# Patient Record
Sex: Female | Born: 1955 | Race: Black or African American | Hispanic: No | Marital: Married | Smoking: Never smoker
Health system: Southern US, Community
[De-identification: ages and names within clinical notes are randomized; demographics above are authoritative.]

## PROBLEM LIST (undated history)

## (undated) DIAGNOSIS — E119 Type 2 diabetes mellitus without complications: Secondary | ICD-10-CM

## (undated) DIAGNOSIS — I10 Essential (primary) hypertension: Secondary | ICD-10-CM

## (undated) DIAGNOSIS — E079 Disorder of thyroid, unspecified: Secondary | ICD-10-CM

## (undated) DIAGNOSIS — J40 Bronchitis, not specified as acute or chronic: Secondary | ICD-10-CM

## (undated) DIAGNOSIS — R51 Headache: Secondary | ICD-10-CM

## (undated) DIAGNOSIS — D869 Sarcoidosis, unspecified: Secondary | ICD-10-CM

## (undated) DIAGNOSIS — L03116 Cellulitis of left lower limb: Secondary | ICD-10-CM

## (undated) DIAGNOSIS — E039 Hypothyroidism, unspecified: Secondary | ICD-10-CM

## (undated) DIAGNOSIS — D259 Leiomyoma of uterus, unspecified: Secondary | ICD-10-CM

## (undated) DIAGNOSIS — M797 Fibromyalgia: Secondary | ICD-10-CM

## (undated) DIAGNOSIS — K219 Gastro-esophageal reflux disease without esophagitis: Secondary | ICD-10-CM

## (undated) DIAGNOSIS — Z9289 Personal history of other medical treatment: Secondary | ICD-10-CM

## (undated) DIAGNOSIS — E11621 Type 2 diabetes mellitus with foot ulcer: Secondary | ICD-10-CM

## (undated) DIAGNOSIS — M2550 Pain in unspecified joint: Secondary | ICD-10-CM

## (undated) DIAGNOSIS — E11610 Type 2 diabetes mellitus with diabetic neuropathic arthropathy: Secondary | ICD-10-CM

## (undated) DIAGNOSIS — J309 Allergic rhinitis, unspecified: Secondary | ICD-10-CM

## (undated) DIAGNOSIS — M5090 Cervical disc disorder, unspecified, unspecified cervical region: Secondary | ICD-10-CM

## (undated) DIAGNOSIS — A419 Sepsis, unspecified organism: Secondary | ICD-10-CM

## (undated) DIAGNOSIS — L97509 Non-pressure chronic ulcer of other part of unspecified foot with unspecified severity: Secondary | ICD-10-CM

## (undated) DIAGNOSIS — J988 Other specified respiratory disorders: Secondary | ICD-10-CM

## (undated) DIAGNOSIS — M1990 Unspecified osteoarthritis, unspecified site: Secondary | ICD-10-CM

## (undated) DIAGNOSIS — M8660 Other chronic osteomyelitis, unspecified site: Secondary | ICD-10-CM

## (undated) DIAGNOSIS — G8194 Hemiplegia, unspecified affecting left nondominant side: Secondary | ICD-10-CM

## (undated) DIAGNOSIS — R519 Headache, unspecified: Secondary | ICD-10-CM

## (undated) DIAGNOSIS — E871 Hypo-osmolality and hyponatremia: Secondary | ICD-10-CM

## (undated) HISTORY — PX: THYROIDECTOMY: SHX17

## (undated) HISTORY — PX: KNEE ARTHROPLASTY: SHX992

## (undated) HISTORY — PX: CHOLECYSTECTOMY: SHX55

## (undated) HISTORY — PX: FOOT SURGERY: SHX648

## (undated) HISTORY — PX: UTERINE FIBROID SURGERY: SHX826

## (undated) HISTORY — PX: ANTERIOR CERVICAL DECOMP/DISCECTOMY FUSION: SHX1161

## (undated) HISTORY — PX: APPLICATION OF WOUND VAC: SHX5189

## (undated) HISTORY — PX: IRRIGATION AND DEBRIDEMENT FOOT: SHX6602

---

## 2013-10-15 DIAGNOSIS — E11610 Type 2 diabetes mellitus with diabetic neuropathic arthropathy: Secondary | ICD-10-CM | POA: Insufficient documentation

## 2013-10-15 DIAGNOSIS — M25579 Pain in unspecified ankle and joints of unspecified foot: Secondary | ICD-10-CM | POA: Insufficient documentation

## 2014-08-30 DIAGNOSIS — L97529 Non-pressure chronic ulcer of other part of left foot with unspecified severity: Secondary | ICD-10-CM

## 2014-08-30 DIAGNOSIS — E11621 Type 2 diabetes mellitus with foot ulcer: Secondary | ICD-10-CM | POA: Insufficient documentation

## 2014-09-25 DIAGNOSIS — M86672 Other chronic osteomyelitis, left ankle and foot: Secondary | ICD-10-CM | POA: Insufficient documentation

## 2014-09-25 DIAGNOSIS — E871 Hypo-osmolality and hyponatremia: Secondary | ICD-10-CM | POA: Insufficient documentation

## 2016-02-07 DIAGNOSIS — G8194 Hemiplegia, unspecified affecting left nondominant side: Secondary | ICD-10-CM | POA: Insufficient documentation

## 2016-03-13 DIAGNOSIS — M5002 Cervical disc disorder with myelopathy, mid-cervical region, unspecified level: Secondary | ICD-10-CM | POA: Insufficient documentation

## 2016-09-05 ENCOUNTER — Ambulatory Visit (INDEPENDENT_AMBULATORY_CARE_PROVIDER_SITE_OTHER): Payer: Medicare Other | Admitting: Orthopaedic Surgery

## 2016-09-05 ENCOUNTER — Encounter (INDEPENDENT_AMBULATORY_CARE_PROVIDER_SITE_OTHER): Payer: Self-pay | Admitting: Orthopaedic Surgery

## 2016-09-05 DIAGNOSIS — E785 Hyperlipidemia, unspecified: Secondary | ICD-10-CM | POA: Insufficient documentation

## 2016-09-05 DIAGNOSIS — E118 Type 2 diabetes mellitus with unspecified complications: Secondary | ICD-10-CM | POA: Insufficient documentation

## 2016-09-05 DIAGNOSIS — M5442 Lumbago with sciatica, left side: Secondary | ICD-10-CM

## 2016-09-05 DIAGNOSIS — I10 Essential (primary) hypertension: Secondary | ICD-10-CM | POA: Insufficient documentation

## 2016-09-05 DIAGNOSIS — D869 Sarcoidosis, unspecified: Secondary | ICD-10-CM | POA: Insufficient documentation

## 2016-09-05 DIAGNOSIS — M542 Cervicalgia: Secondary | ICD-10-CM | POA: Diagnosis not present

## 2016-09-05 DIAGNOSIS — G8929 Other chronic pain: Secondary | ICD-10-CM | POA: Insufficient documentation

## 2016-09-05 DIAGNOSIS — E1169 Type 2 diabetes mellitus with other specified complication: Secondary | ICD-10-CM | POA: Insufficient documentation

## 2016-09-05 NOTE — Progress Notes (Signed)
Office Visit Note   Patient: Natalie Macdonald           Date of Birth: 04-22-1956           MRN: [id number] Visit Date: 09/05/2016              Requested by: No referring provider defined for this encounter. PCP: No primary care provider on file.   Assessment & Plan: Visit Diagnoses:  1. Cervicalgia   2. Chronic left-sided low back pain with left-sided sciatica     Plan: I reviewed the MRI report that she brought with her today. Shows that she has multiple levels of degenerative disc disease with severe canal stenosis. I recommend referral to Natalie Macdonald of [place] [facility] for evaluation for surgery  Follow-Up Instructions: No Follow-up on file.   Orders:  Orders Placed This Encounter  Procedures  . Ambulatory referral to Neurosurgery   No orders of the defined types were placed in this encounter.     Procedures: No procedures performed   Clinical Data: No additional findings.   Subjective: Chief Complaint  Patient presents with  . Neck - Pain  . Lower Back - Pain    Patient is a 61 year old female with chronic neck and low back pain who comes in having not walked since about March 2016. She originally had diabetic foot ulcer on the left foot but this is healed up.  She has severe neck and back pain and she is here with MRIs of her neck and back from [facility]. She has had issues with incontinence for several months now.    Review of Systems  Constitutional: Negative.   HENT: Negative.   Eyes: Negative.   Respiratory: Negative.   Cardiovascular: Negative.   Endocrine: Negative.   Musculoskeletal: Negative.   Neurological: Negative.   Hematological: Negative.   Psychiatric/Behavioral: Negative.   All other systems reviewed and are negative.    Objective: Vital Signs: There were no vitals taken for this visit.  Physical Exam  Constitutional: She is oriented to person, place, and time. She appears well-developed and well-nourished.  HENT:  Head:  Normocephalic and atraumatic.  Eyes: EOM are normal.  Neck: Neck supple.  Pulmonary/Chest: Effort normal.  Abdominal: Soft.  Neurological: She is alert and oriented to person, place, and time.  Skin: Skin is warm. Capillary refill takes less than 2 seconds.  Psychiatric: She has a normal mood and affect. Her behavior is normal. Judgment and thought content normal.  Nursing note and vitals reviewed.   Ortho Exam Exam of bilateral upper extremities shows no pathologic reflexes. She does have mild contractures of her left hand. Sensation is grossly intact. Exam of all lower extremities is very difficult secondary to patient's participation. Specialty Comments:  No specialty comments available.  Imaging: No results found.   PMFS History: Patient Active Problem List   Diagnosis Date Noted  . Chronic left-sided low back pain with left-sided sciatica 09/05/2016  . Cervicalgia 09/05/2016  . Diabetes mellitus type 2 with complications ([place]) [id number]  . Hyperlipidemia, unspecified 09/05/2016  . Hypertension 09/05/2016  . Sarcoidosis ([place]) 09/05/2016  . Cervical disc disorder with myelopathy of mid-cervical region 03/13/2016  . Left hemiplegia ([place]) 02/07/2016  . Chronic osteomyelitis of ankle and foot, left ([place]) 09/25/2014  . Hyponatremia 09/25/2014  . Diabetic ulcer of left foot associated with type 2 diabetes mellitus ([place]) 08/30/2014  . Charcot's joint of foot due to diabetes ([place]) 10/15/2013  . Pain in joint involving ankle and foot  10/15/2013   No past medical history on file.  No family history on file.  No past surgical history on file. Social History   Occupational History  . Not on file.   Social History Main Topics  . Smoking status: Never Smoker  . Smokeless tobacco: Never Used  . Alcohol use Not on file  . Drug use: Unknown  . Sexual activity: Not on file

## 2016-09-21 ENCOUNTER — Other Ambulatory Visit (HOSPITAL_COMMUNITY): Payer: Self-pay | Admitting: Orthopedic Surgery

## 2016-09-21 ENCOUNTER — Other Ambulatory Visit: Payer: Self-pay | Admitting: Neurosurgery

## 2016-09-21 DIAGNOSIS — M25512 Pain in left shoulder: Secondary | ICD-10-CM

## 2016-09-27 ENCOUNTER — Ambulatory Visit (HOSPITAL_COMMUNITY)
Admission: RE | Admit: 2016-09-27 | Discharge: 2016-09-27 | Disposition: A | Discharge status: Home / Self Care | Payer: Medicare Other | Source: Ambulatory Visit | Attending: Orthopedic Surgery | Admitting: Orthopedic Surgery

## 2016-09-27 DIAGNOSIS — M19012 Primary osteoarthritis, left shoulder: Secondary | ICD-10-CM | POA: Diagnosis not present

## 2016-09-27 DIAGNOSIS — M25512 Pain in left shoulder: Secondary | ICD-10-CM | POA: Diagnosis not present

## 2016-10-10 ENCOUNTER — Encounter (HOSPITAL_COMMUNITY): Payer: Self-pay | Admitting: *Deleted

## 2016-10-10 ENCOUNTER — Encounter (HOSPITAL_COMMUNITY)
Admission: RE | Admit: 2016-10-10 | Discharge: 2016-10-10 | Disposition: A | Discharge status: Home / Self Care | Payer: Medicare Other | Source: Ambulatory Visit | Attending: Neurosurgery | Admitting: Neurosurgery

## 2016-10-10 ENCOUNTER — Other Ambulatory Visit: Payer: Self-pay

## 2016-10-10 DIAGNOSIS — R931 Abnormal findings on diagnostic imaging of heart and coronary circulation: Secondary | ICD-10-CM | POA: Insufficient documentation

## 2016-10-10 DIAGNOSIS — G8929 Other chronic pain: Secondary | ICD-10-CM | POA: Diagnosis not present

## 2016-10-10 DIAGNOSIS — M5002 Cervical disc disorder with myelopathy, mid-cervical region, unspecified level: Secondary | ICD-10-CM | POA: Insufficient documentation

## 2016-10-10 DIAGNOSIS — E11610 Type 2 diabetes mellitus with diabetic neuropathic arthropathy: Secondary | ICD-10-CM | POA: Insufficient documentation

## 2016-10-10 DIAGNOSIS — E118 Type 2 diabetes mellitus with unspecified complications: Secondary | ICD-10-CM | POA: Insufficient documentation

## 2016-10-10 DIAGNOSIS — I10 Essential (primary) hypertension: Secondary | ICD-10-CM | POA: Diagnosis not present

## 2016-10-10 DIAGNOSIS — Z01812 Encounter for preprocedural laboratory examination: Secondary | ICD-10-CM | POA: Diagnosis not present

## 2016-10-10 DIAGNOSIS — M5442 Lumbago with sciatica, left side: Secondary | ICD-10-CM | POA: Insufficient documentation

## 2016-10-10 DIAGNOSIS — E871 Hypo-osmolality and hyponatremia: Secondary | ICD-10-CM | POA: Diagnosis not present

## 2016-10-10 DIAGNOSIS — Z01810 Encounter for preprocedural cardiovascular examination: Secondary | ICD-10-CM | POA: Diagnosis present

## 2016-10-10 DIAGNOSIS — D869 Sarcoidosis, unspecified: Secondary | ICD-10-CM | POA: Insufficient documentation

## 2016-10-10 DIAGNOSIS — E785 Hyperlipidemia, unspecified: Secondary | ICD-10-CM | POA: Insufficient documentation

## 2016-10-10 DIAGNOSIS — E039 Hypothyroidism, unspecified: Secondary | ICD-10-CM | POA: Diagnosis not present

## 2016-10-10 DIAGNOSIS — M86672 Other chronic osteomyelitis, left ankle and foot: Secondary | ICD-10-CM | POA: Diagnosis not present

## 2016-10-10 HISTORY — DX: Hypothyroidism, unspecified: E03.9

## 2016-10-10 HISTORY — DX: Leiomyoma of uterus, unspecified: D25.9

## 2016-10-10 HISTORY — DX: Headache: R51

## 2016-10-10 HISTORY — DX: Cervical disc disorder, unspecified, unspecified cervical region: M50.90

## 2016-10-10 HISTORY — DX: Fibromyalgia: M79.7

## 2016-10-10 HISTORY — DX: Other chronic osteomyelitis, unspecified site: M86.60

## 2016-10-10 HISTORY — DX: Bronchitis, not specified as acute or chronic: J40

## 2016-10-10 HISTORY — DX: Type 2 diabetes mellitus with diabetic neuropathic arthropathy: E11.610

## 2016-10-10 HISTORY — DX: Type 2 diabetes mellitus without complications: E11.9

## 2016-10-10 HISTORY — DX: Sepsis, unspecified organism: A41.9

## 2016-10-10 HISTORY — DX: Hypo-osmolality and hyponatremia: E87.1

## 2016-10-10 HISTORY — DX: Pain in unspecified joint: M25.50

## 2016-10-10 HISTORY — DX: Hemiplegia, unspecified affecting left nondominant side: G81.94

## 2016-10-10 HISTORY — DX: Cellulitis of left lower limb: L03.116

## 2016-10-10 HISTORY — DX: Type 2 diabetes mellitus with foot ulcer: L97.509

## 2016-10-10 HISTORY — DX: Personal history of other medical treatment: Z92.89

## 2016-10-10 HISTORY — DX: Unspecified osteoarthritis, unspecified site: M19.90

## 2016-10-10 HISTORY — DX: Gastro-esophageal reflux disease without esophagitis: K21.9

## 2016-10-10 HISTORY — DX: Sarcoidosis, unspecified: D86.9

## 2016-10-10 HISTORY — DX: Type 2 diabetes mellitus with foot ulcer: E11.621

## 2016-10-10 HISTORY — DX: Essential (primary) hypertension: I10

## 2016-10-10 HISTORY — DX: Other specified respiratory disorders: J98.8

## 2016-10-10 HISTORY — DX: Headache, unspecified: R51.9

## 2016-10-10 HISTORY — DX: Allergic rhinitis, unspecified: J30.9

## 2016-10-10 LAB — CBC
HCT: 40 % (ref 36.0–46.0)
Hemoglobin: 12.8 g/dL (ref 12.0–15.0)
MCH: 28.3 pg (ref 26.0–34.0)
MCHC: 32 g/dL (ref 30.0–36.0)
MCV: 88.3 fL (ref 78.0–100.0)
Platelets: 227 10*3/uL (ref 150–400)
RBC: 4.53 MIL/uL (ref 3.87–5.11)
RDW: 14.2 % (ref 11.5–15.5)
WBC: 15.9 10*3/uL — ABNORMAL HIGH (ref 4.0–10.5)

## 2016-10-10 LAB — BASIC METABOLIC PANEL
Anion gap: 8 (ref 5–15)
BUN: 13 mg/dL (ref 6–20)
CALCIUM: 9.2 mg/dL (ref 8.9–10.3)
CO2: 26 mmol/L (ref 22–32)
CREATININE: 0.69 mg/dL (ref 0.44–1.00)
Chloride: 101 mmol/L (ref 101–111)
GFR calc Af Amer: >60 mL/min (ref >60)
GFR calc non Af Amer: >60 mL/min (ref >60)
GLUCOSE: 102 mg/dL — AB (ref 65–99)
Potassium: 3.8 mmol/L (ref 3.5–5.1)
Sodium: 135 mmol/L (ref 135–145)

## 2016-10-10 LAB — ABO/RH: ABO/RH(D): O POS

## 2016-10-10 LAB — SURGICAL PCR SCREEN
MRSA, PCR: NEGATIVE
STAPHYLOCOCCUS AUREUS: NEGATIVE

## 2016-10-10 LAB — TYPE AND SCREEN
ABO/RH(D): O POS
Antibody Screen: NEGATIVE

## 2016-10-10 LAB — GLUCOSE, CAPILLARY: Glucose-Capillary: 116 mg/dL — ABNORMAL HIGH (ref 65–99)

## 2016-10-10 NOTE — Pre-Procedure Instructions (Addendum)
Natalie Macdonald  10/10/2016      [id number] [address], [place] [address] Phone: [phone] Fax: [phone]    Your procedure is scheduled on April 17  Report to [place] at 1245 A.M.  Call this number if you have problems the morning of surgery:  [phone]   Remember:  Do not eat food or drink liquids after midnight.   Take these medicines the morning of surgery with A SIP OF WATER albuterol (PROVENTIL HFA;VENTOLIN HFA),  carvedilol (COREG), cetirizine (ZYRTEC), fluticasone (FLONASE), gabapentin (NEURONTIN), levothyroxine (SYNTHROID, LEVOTHROID),  pantoprazole (PROTONIX), baclofen (LIORESAL,  morphine (MS CONTIN)  Take all other medications as prescribed except 7 days prior to surgery STOP taking any Aspirin, Aleve, Naproxen, Ibuprofen, Motrin, Advil, Goody's, BC's, all herbal medications, fish oil, and all vitamins  WHAT DO I DO ABOUT MY DIABETES MEDICATION?   Marland Kitchen Do not take oral diabetes medicines (pills) the morning of surgery.  metFORMIN (GLUCOPHAGE)  The day before surgery take usual dose of insulin glulisine (APIDRA)   . THE NIGHT BEFORE SURGERY, take ____22_______ units of __insulin glargine (LANTUS_________insulin.       . The day of surgery, do not take other diabetes injectables, including Byetta (exenatide), Bydureon (exenatide ER), Victoza (liraglutide), or Trulicity (dulaglutide).  . If your CBG is greater than 220 mg/dL, you may take  of your sliding scale (correction) dose of insulin.   How to Manage Your Diabetes Before and After Surgery  Why is it important to control my blood sugar before and after surgery? . Improving blood sugar levels before and after surgery helps healing and can limit problems. . A way of improving blood sugar control is eating a healthy diet by: o  Eating less sugar and carbohydrates o  Increasing activity/exercise o  Talking with  your doctor about reaching your blood sugar goals . High blood sugars (greater than 180 mg/dL) can raise your risk of infections and slow your recovery, so you will need to focus on controlling your diabetes during the weeks before surgery. . Make sure that the doctor who takes care of your diabetes knows about your planned surgery including the date and location.  How do I manage my blood sugar before surgery? . Check your blood sugar at least 4 times a day, starting 2 days before surgery, to make sure that the level is not too high or low. o Check your blood sugar the morning of your surgery when you wake up and every 2 hours until you get to the Short Stay unit. . If your blood sugar is less than 70 mg/dL, you will need to treat for low blood sugar: o Do not take insulin. o Treat a low blood sugar (less than 70 mg/dL) with  cup of clear juice (cranberry or apple), 4 glucose tablets, OR glucose gel. o Recheck blood sugar in 15 minutes after treatment (to make sure it is greater than 70 mg/dL). If your blood sugar is not greater than 70 mg/dL on recheck, call [phone] for further instructions. . Report your blood sugar to the short stay nurse when you get to Short Stay.  . If you are admitted to the hospital after surgery: o Your blood sugar will be checked by the staff and you will probably be given insulin after surgery (instead of oral diabetes medicines) to make sure you have good blood sugar levels. o The goal for blood sugar  control after surgery is 80-180 mg/dL.     Do not wear jewelry, make-up or nail polish.  Do not wear lotions, powders, or perfumes, or deoderant.  Do not shave 48 hours prior to surgery.  Men may shave face and neck.  Do not bring valuables to the hospital.  [facility] is not responsible for any belongings or valuables.  Contacts, dentures or bridgework may not be worn into surgery.  Leave your suitcase in the car.  After surgery it may be brought to your  room.  For patients admitted to the hospital, discharge time will be determined by your treatment team.  Patients discharged the day of surgery will not be allowed to drive home.    Special instructions:   - Preparing For Surgery  Before surgery, you can play an important role. Because skin is not sterile, your skin needs to be as free of germs as possible. You can reduce the number of germs on your skin by washing with CHG (chlorahexidine gluconate) Soap before surgery.  CHG is an antiseptic cleaner which kills germs and bonds with the skin to continue killing germs even after washing.  Please do not use if you have an allergy to CHG or antibacterial soaps. If your skin becomes reddened/irritated stop using the CHG.  Do not shave (including legs and underarms) for at least 48 hours prior to first CHG shower. It is OK to shave your face.  Please follow these instructions carefully.   1. Shower the NIGHT BEFORE SURGERY and the MORNING OF SURGERY with CHG.   2. If you chose to wash your hair, wash your hair first as usual with your normal shampoo.  3. After you shampoo, rinse your hair and body thoroughly to remove the shampoo.  4. Use CHG as you would any other liquid soap. You can apply CHG directly to the skin and wash gently with a scrungie or a clean washcloth.   5. Apply the CHG Soap to your body ONLY FROM THE NECK DOWN.  Do not use on open wounds or open sores. Avoid contact with your eyes, ears, mouth and genitals (private parts). Wash genitals (private parts) with your normal soap.  6. Wash thoroughly, paying special attention to the area where your surgery will be performed.  7. Thoroughly rinse your body with warm water from the neck down.  8. DO NOT shower/wash with your normal soap after using and rinsing off the CHG Soap.  9. Pat yourself dry with a CLEAN TOWEL.   10. Wear CLEAN PAJAMAS   11. Place CLEAN SHEETS on your bed the night of your first shower and  DO NOT SLEEP WITH PETS.    Day of Surgery: Do not apply any deodorants/lotions. Please wear clean clothes to the hospital/surgery center.      Please read over the following fact sheets that you were given.

## 2016-10-10 NOTE — Progress Notes (Signed)
PCP - [facility] Cardiologist - denies  Chest x-ray - not needed EKG - 10/10/16 Stress Test - 01/02/12 ECHO - 2013 Cardiac Cath denies-     Fasting Blood Sugar - 98-180s Checks Blood Sugar __2___ times a day   Will send to anesthesia for review of records patient has two bed sores on her bottom will make surgeon office aware  Patient denies shortness of breath, fever, cough and chest pain at PAT appointment   Patient verbalized understanding of instructions that was given to them at the PAT appointment. Patient expressed that there were no further questions.  Patient was also instructed that they will need to review over the PAT instructions again at home before the surgery.

## 2016-10-11 LAB — HEMOGLOBIN A1C
HEMOGLOBIN A1C: 9.3 % — AB (ref 4.8–5.6)
MEAN PLASMA GLUCOSE: 220 mg/dL

## 2016-10-12 ENCOUNTER — Encounter (HOSPITAL_COMMUNITY): Payer: Self-pay

## 2016-10-12 NOTE — Progress Notes (Addendum)
Anesthesia Chart Review: Patient is a 61 year old female scheduled for ACDF, C5-6, C6-7, possible C6 corpectomy on 10/17/2016 by Dr. Kathyrn Sheriff.  History includes never smoker, DM2, total thyroidectomy (goiter) 11/20/07, hypothyroidism, hypertension, sarcoidosis, GERD, fibromyalgia, left foot chronic osteomyelitis '16 (ID Dr. Russ Halo), left hemiplegia (documented as LUE weakness by PCP; no history of CVA noted in PCP problem list or by patient PAT history), cholecystectomy, bilateral TKA 10/1004. BMI listed is consistent with obesity.   PCP is Dr. Clifton Custard at [facility]. Last visit 06/2016.   Meds include albuterol, amitriptyline, amlodipine-benazepril, baclofen, Coreg, Zyrtec, chlorthalidone, Flonase, Neurontin, Lantus,Apidra, levothyroxine, metformin, Singulair, MS Contin, oxycodone, Protonix, pravastatin, Zantac, vitamin E.  BP 138/78   Pulse 86   Temp 36.9 C   Resp 20   Ht 5\' 6"  (1.676 m)   Wt 220 lb (99.8 kg) Comment: pt states she cannot stand and estimated her weight as 220lb  SpO2 99%   BMI 35.51 kg/m   EKG 10/10/16: SR with short PR (PR 106 ms), non-specific T wave abnormality. (By my measurement PR is 112 ms.). Overall, I think tracing is stable when compared to 09/26/12 EKG from [place]. PR then was documented as 150 ms.  Echo 01/03/12 ([facility]; Care Everywhere): Result Impression: Very mildly dilated and concentriclly hypertrophied left ventricle with normal systolic function. Mild left atrial enlargement. Trivial mitral and tricuspid regurgitation.   Nuclear stress test 01/02/12 ([facility]; Care Everywhere): FINDINGS: Regional wall motion:reveals normal myocardial thickening and wall motion. The overall quality of the study is good. Artifacts noted: Severe breast , GI uptake, mild diaphragm Left ventricular cavity: normal. LVEF = 60%. Perfusion Analysis:SPECT images demonstrate small perfusion abnormality of  mild intensity is present in the inferoapical  region on the stress images. RESULT IMPRESSION: Myocardial perfusion imaging is Normal. Summed severity score is normal. Artifacts noted:Breast, GI uptake and diaphragm. Overall left ventricular systolic function was Normal without regional wall motion abnormalities (see above). Compared to the prior study from no prior. Small perfusion defects noted most likely represent artifact  Preoperative labs noted. WBC 15.9. H/H 12.8/40.0. Cr 0.69. Gluocse 102, but A1c 9.3, consistent with mean plasma glucose of 220. She reported fasting CBGs of 98-180's.   Patient denied SOB, fever, cough, and chest pain at PAT. I dicussed above with anesthesiologist Dr. Kalman Shan. Dr. Kathyrn Sheriff to review labs and can make determination whether or not he would like patient re-evaluated by her PCP. Otherwise, patient will be further evaluated on the day of surgery. If fasting CBG is > 200 or patient with S/S infection then surgery could be cancelled. Nicki at Dr. Cleotilde Neer office noted. (Update 10/16/16 10:10 AM:  Per Nicki, Dr. Kathyrn Sheriff aware of A1c and WBC. He plans to proceed if glucose result acceptable on the day of surgery.)  George Hugh [facility] Short Stay Center/Anesthesiology Phone [phone] 10/12/2016 2:20 PM

## 2016-10-17 ENCOUNTER — Encounter (HOSPITAL_COMMUNITY): Payer: Self-pay | Admitting: Certified Registered"

## 2016-10-17 ENCOUNTER — Observation Stay (HOSPITAL_COMMUNITY)
Admission: RE | Admit: 2016-10-17 | Discharge: 2016-10-18 | Disposition: A | Discharge status: Home / Self Care | Payer: Medicare Other | Source: Ambulatory Visit | Attending: Neurosurgery | Admitting: Neurosurgery

## 2016-10-17 ENCOUNTER — Inpatient Hospital Stay (HOSPITAL_COMMUNITY): Payer: Medicare Other | Admitting: Vascular Surgery

## 2016-10-17 ENCOUNTER — Encounter (HOSPITAL_COMMUNITY)
Admission: RE | Disposition: A | Discharge status: Home / Self Care | Payer: Self-pay | Source: Ambulatory Visit | Attending: Neurosurgery

## 2016-10-17 ENCOUNTER — Inpatient Hospital Stay (HOSPITAL_COMMUNITY): Payer: Medicare Other

## 2016-10-17 DIAGNOSIS — E11610 Type 2 diabetes mellitus with diabetic neuropathic arthropathy: Secondary | ICD-10-CM | POA: Insufficient documentation

## 2016-10-17 DIAGNOSIS — Z9049 Acquired absence of other specified parts of digestive tract: Secondary | ICD-10-CM | POA: Diagnosis not present

## 2016-10-17 DIAGNOSIS — Z91018 Allergy to other foods: Secondary | ICD-10-CM | POA: Insufficient documentation

## 2016-10-17 DIAGNOSIS — Z993 Dependence on wheelchair: Secondary | ICD-10-CM | POA: Insufficient documentation

## 2016-10-17 DIAGNOSIS — Z794 Long term (current) use of insulin: Secondary | ICD-10-CM | POA: Diagnosis not present

## 2016-10-17 DIAGNOSIS — I10 Essential (primary) hypertension: Secondary | ICD-10-CM | POA: Diagnosis not present

## 2016-10-17 DIAGNOSIS — Z79899 Other long term (current) drug therapy: Secondary | ICD-10-CM | POA: Insufficient documentation

## 2016-10-17 DIAGNOSIS — M4712 Other spondylosis with myelopathy, cervical region: Principal | ICD-10-CM | POA: Diagnosis present

## 2016-10-17 DIAGNOSIS — M797 Fibromyalgia: Secondary | ICD-10-CM | POA: Insufficient documentation

## 2016-10-17 DIAGNOSIS — K219 Gastro-esophageal reflux disease without esophagitis: Secondary | ICD-10-CM | POA: Diagnosis not present

## 2016-10-17 DIAGNOSIS — E871 Hypo-osmolality and hyponatremia: Secondary | ICD-10-CM | POA: Insufficient documentation

## 2016-10-17 DIAGNOSIS — M50022 Cervical disc disorder at C5-C6 level with myelopathy: Secondary | ICD-10-CM | POA: Insufficient documentation

## 2016-10-17 DIAGNOSIS — Z419 Encounter for procedure for purposes other than remedying health state, unspecified: Secondary | ICD-10-CM

## 2016-10-17 DIAGNOSIS — Z884 Allergy status to anesthetic agent status: Secondary | ICD-10-CM | POA: Diagnosis not present

## 2016-10-17 DIAGNOSIS — M86672 Other chronic osteomyelitis, left ankle and foot: Secondary | ICD-10-CM | POA: Insufficient documentation

## 2016-10-17 DIAGNOSIS — R51 Headache: Secondary | ICD-10-CM | POA: Diagnosis not present

## 2016-10-17 DIAGNOSIS — M170 Bilateral primary osteoarthritis of knee: Secondary | ICD-10-CM | POA: Diagnosis not present

## 2016-10-17 DIAGNOSIS — G8194 Hemiplegia, unspecified affecting left nondominant side: Secondary | ICD-10-CM | POA: Diagnosis not present

## 2016-10-17 DIAGNOSIS — E890 Postprocedural hypothyroidism: Secondary | ICD-10-CM | POA: Insufficient documentation

## 2016-10-17 DIAGNOSIS — J309 Allergic rhinitis, unspecified: Secondary | ICD-10-CM | POA: Insufficient documentation

## 2016-10-17 DIAGNOSIS — L8990 Pressure ulcer of unspecified site, unspecified stage: Secondary | ICD-10-CM | POA: Insufficient documentation

## 2016-10-17 HISTORY — PX: ANTERIOR CERVICAL DECOMP/DISCECTOMY FUSION: SHX1161

## 2016-10-17 LAB — GLUCOSE, CAPILLARY
Glucose-Capillary: 110 mg/dL — ABNORMAL HIGH (ref 65–99)
Glucose-Capillary: 166 mg/dL — ABNORMAL HIGH (ref 65–99)
Glucose-Capillary: 277 mg/dL — ABNORMAL HIGH (ref 65–99)
Glucose-Capillary: 366 mg/dL — ABNORMAL HIGH (ref 65–99)

## 2016-10-17 SURGERY — ANTERIOR CERVICAL DECOMPRESSION/DISCECTOMY FUSION 2 LEVELS
Anesthesia: General

## 2016-10-17 MED ORDER — PHENYLEPHRINE 40 MCG/ML (10ML) SYRINGE FOR IV PUSH (FOR BLOOD PRESSURE SUPPORT)
PREFILLED_SYRINGE | INTRAVENOUS | Status: DC | PRN
  Administered 2016-10-17: 80 ug via INTRAVENOUS

## 2016-10-17 MED ORDER — FENTANYL CITRATE (PF) 250 MCG/5ML IJ SOLN
INTRAMUSCULAR | Status: DC | PRN
  Administered 2016-10-17: 50 ug via INTRAVENOUS
  Administered 2016-10-17 (×2): 100 ug via INTRAVENOUS

## 2016-10-17 MED ORDER — ZOLPIDEM TARTRATE 5 MG PO TABS: 5.0000 mg | ORAL_TABLET | Freq: Every evening | ORAL | Status: DC | PRN

## 2016-10-17 MED ORDER — AMLODIPINE BESYLATE 5 MG PO TABS
5.0000 mg | ORAL_TABLET | Freq: Every day | ORAL | Status: DC
  Administered 2016-10-17 – 2016-10-18 (×2): 5 mg via ORAL
  Filled 2016-10-17 (×2): qty 1

## 2016-10-17 MED ORDER — ONDANSETRON HCL 4 MG/2ML IJ SOLN: 4.0000 mg | Freq: Four times a day (QID) | INTRAMUSCULAR | Status: DC | PRN

## 2016-10-17 MED ORDER — SODIUM CHLORIDE 0.9% FLUSH
3.0000 mL | Freq: Two times a day (BID) | INTRAVENOUS | Status: DC
  Administered 2016-10-17 – 2016-10-18 (×2): 3 mL via INTRAVENOUS

## 2016-10-17 MED ORDER — MENTHOL 3 MG MT LOZG: 1.0000 | LOZENGE | OROMUCOSAL | Status: DC | PRN

## 2016-10-17 MED ORDER — LIDOCAINE 2% (20 MG/ML) 5 ML SYRINGE
INTRAMUSCULAR | Status: DC | PRN
Start: 2016-10-17 — End: 2016-10-17
  Administered 2016-10-17: 80 mg via INTRAVENOUS

## 2016-10-17 MED ORDER — BACLOFEN 10 MG PO TABS
10.0000 mg | ORAL_TABLET | Freq: Three times a day (TID) | ORAL | Status: DC
  Administered 2016-10-17 – 2016-10-18 (×3): 10 mg via ORAL
  Filled 2016-10-17 (×3): qty 1

## 2016-10-17 MED ORDER — CALCIUM CARBONATE 1250 (500 CA) MG PO TABS
1250.0000 mg | ORAL_TABLET | Freq: Every day | ORAL | Status: DC
  Administered 2016-10-18: 1250 mg via ORAL
  Filled 2016-10-17: qty 1

## 2016-10-17 MED ORDER — ALBUTEROL SULFATE (2.5 MG/3ML) 0.083% IN NEBU: 2.5000 mg | INHALATION_SOLUTION | Freq: Four times a day (QID) | RESPIRATORY_TRACT | Status: DC | PRN

## 2016-10-17 MED ORDER — MIDAZOLAM HCL 2 MG/2ML IJ SOLN
INTRAMUSCULAR | Status: AC
  Filled 2016-10-17: qty 2

## 2016-10-17 MED ORDER — BISACODYL 10 MG RE SUPP: 10.0000 mg | Freq: Every day | RECTAL | Status: DC | PRN

## 2016-10-17 MED ORDER — CEFAZOLIN SODIUM-DEXTROSE 2-4 GM/100ML-% IV SOLN
2.0000 g | INTRAVENOUS | Status: AC
  Administered 2016-10-17: 2 g via INTRAVENOUS

## 2016-10-17 MED ORDER — LIDOCAINE-EPINEPHRINE 1 %-1:100000 IJ SOLN
INTRAMUSCULAR | Status: DC | PRN
  Administered 2016-10-17: 10 mL

## 2016-10-17 MED ORDER — THROMBIN 5000 UNITS EX SOLR
OROMUCOSAL | Status: DC | PRN
  Administered 2016-10-17: 10:00:00 via TOPICAL

## 2016-10-17 MED ORDER — PROPOFOL 10 MG/ML IV BOLUS
INTRAVENOUS | Status: DC | PRN
  Administered 2016-10-17: 160 mg via INTRAVENOUS

## 2016-10-17 MED ORDER — BUPIVACAINE HCL (PF) 0.5 % IJ SOLN
INTRAMUSCULAR | Status: AC
  Filled 2016-10-17: qty 30

## 2016-10-17 MED ORDER — DEXAMETHASONE SODIUM PHOSPHATE 10 MG/ML IJ SOLN
INTRAMUSCULAR | Status: DC | PRN
  Administered 2016-10-17: 10 mg via INTRAVENOUS

## 2016-10-17 MED ORDER — GABAPENTIN 300 MG PO CAPS: 300.0000 mg | ORAL_CAPSULE | Freq: Three times a day (TID) | ORAL | Status: DC | PRN

## 2016-10-17 MED ORDER — LACTATED RINGERS IV SOLN
INTRAVENOUS | Status: DC
  Administered 2016-10-17 (×2): via INTRAVENOUS

## 2016-10-17 MED ORDER — EPHEDRINE 5 MG/ML INJ
INTRAVENOUS | Status: AC
  Filled 2016-10-17: qty 10

## 2016-10-17 MED ORDER — MONTELUKAST SODIUM 10 MG PO TABS: 10.0000 mg | ORAL_TABLET | Freq: Every day | ORAL | Status: DC

## 2016-10-17 MED ORDER — THROMBIN 20000 UNITS EX SOLR
CUTANEOUS | Status: DC | PRN
  Administered 2016-10-17: 10:00:00 via TOPICAL

## 2016-10-17 MED ORDER — SODIUM CHLORIDE 0.9 % IV SOLN: INTRAVENOUS | Status: DC

## 2016-10-17 MED ORDER — PROPOFOL 10 MG/ML IV BOLUS
INTRAVENOUS | Status: AC
  Filled 2016-10-17: qty 20

## 2016-10-17 MED ORDER — INSULIN ASPART 100 UNIT/ML ~~LOC~~ SOLN: 18.0000 [IU] | Freq: Three times a day (TID) | SUBCUTANEOUS | Status: DC

## 2016-10-17 MED ORDER — ONDANSETRON HCL 4 MG/2ML IJ SOLN
INTRAMUSCULAR | Status: DC | PRN
  Administered 2016-10-17: 4 mg via INTRAVENOUS

## 2016-10-17 MED ORDER — MORPHINE SULFATE (PF) 2 MG/ML IV SOLN
2.0000 mg | INTRAVENOUS | Status: DC | PRN
  Administered 2016-10-17 – 2016-10-18 (×7): 2 mg via INTRAVENOUS
  Filled 2016-10-17 (×6): qty 1

## 2016-10-17 MED ORDER — PROBIOTIC PO CAPS: ORAL_CAPSULE | Freq: Every day | ORAL | Status: DC

## 2016-10-17 MED ORDER — ONDANSETRON HCL 4 MG PO TABS: 4.0000 mg | ORAL_TABLET | Freq: Four times a day (QID) | ORAL | Status: DC | PRN

## 2016-10-17 MED ORDER — PHENYLEPHRINE HCL 10 MG/ML IJ SOLN
INTRAVENOUS | Status: DC | PRN
  Administered 2016-10-17: 100 ug/min via INTRAVENOUS

## 2016-10-17 MED ORDER — LORATADINE 10 MG PO TABS
10.0000 mg | ORAL_TABLET | Freq: Every day | ORAL | Status: DC
  Administered 2016-10-17 – 2016-10-18 (×2): 10 mg via ORAL
  Filled 2016-10-17 (×2): qty 1

## 2016-10-17 MED ORDER — SODIUM CHLORIDE 0.9 % IR SOLN
Status: DC | PRN
  Administered 2016-10-17: 10:00:00

## 2016-10-17 MED ORDER — AMITRIPTYLINE HCL 25 MG PO TABS
100.0000 mg | ORAL_TABLET | Freq: Every day | ORAL | Status: DC
  Administered 2016-10-17: 100 mg via ORAL
  Filled 2016-10-17: qty 4

## 2016-10-17 MED ORDER — PHENOL 1.4 % MT LIQD
1.0000 | OROMUCOSAL | Status: DC | PRN
  Filled 2016-10-17: qty 177

## 2016-10-17 MED ORDER — PHENYLEPHRINE 40 MCG/ML (10ML) SYRINGE FOR IV PUSH (FOR BLOOD PRESSURE SUPPORT)
PREFILLED_SYRINGE | INTRAVENOUS | Status: AC
  Filled 2016-10-17: qty 10

## 2016-10-17 MED ORDER — LACTATED RINGERS IV SOLN: INTRAVENOUS | Status: DC

## 2016-10-17 MED ORDER — INSULIN ASPART 100 UNIT/ML ~~LOC~~ SOLN: 0.0000 [IU] | Freq: Three times a day (TID) | SUBCUTANEOUS | Status: DC

## 2016-10-17 MED ORDER — FENTANYL CITRATE (PF) 100 MCG/2ML IJ SOLN
INTRAMUSCULAR | Status: AC
  Administered 2016-10-17: 50 ug via INTRAVENOUS
  Filled 2016-10-17: qty 2

## 2016-10-17 MED ORDER — ROCURONIUM BROMIDE 10 MG/ML (PF) SYRINGE: PREFILLED_SYRINGE | INTRAVENOUS | Status: DC | PRN

## 2016-10-17 MED ORDER — LIDOCAINE 2% (20 MG/ML) 5 ML SYRINGE
INTRAMUSCULAR | Status: AC
  Filled 2016-10-17: qty 5

## 2016-10-17 MED ORDER — FAMOTIDINE 20 MG PO TABS
20.0000 mg | ORAL_TABLET | Freq: Every day | ORAL | Status: DC
  Administered 2016-10-17: 20 mg via ORAL
  Filled 2016-10-17: qty 1

## 2016-10-17 MED ORDER — AMLODIPINE BESY-BENAZEPRIL HCL 5-40 MG PO CAPS: 1.0000 | ORAL_CAPSULE | Freq: Every day | ORAL | Status: DC

## 2016-10-17 MED ORDER — KETOROLAC TROMETHAMINE 15 MG/ML IJ SOLN
INTRAMUSCULAR | Status: AC
  Administered 2016-10-17: 15 mg via INTRAVENOUS
  Filled 2016-10-17: qty 1

## 2016-10-17 MED ORDER — PANTOPRAZOLE SODIUM 40 MG PO TBEC
40.0000 mg | DELAYED_RELEASE_TABLET | Freq: Every day | ORAL | Status: DC
  Administered 2016-10-17 – 2016-10-18 (×2): 40 mg via ORAL
  Filled 2016-10-17 (×2): qty 1

## 2016-10-17 MED ORDER — LACTATED RINGERS IV SOLN
INTRAVENOUS | Status: DC
  Administered 2016-10-17: 10:00:00 via INTRAVENOUS

## 2016-10-17 MED ORDER — CEFAZOLIN SODIUM-DEXTROSE 2-4 GM/100ML-% IV SOLN
2.0000 g | Freq: Three times a day (TID) | INTRAVENOUS | Status: AC
  Administered 2016-10-17: 2 g via INTRAVENOUS
  Filled 2016-10-17 (×2): qty 100

## 2016-10-17 MED ORDER — SENNA 8.6 MG PO TABS
1.0000 | ORAL_TABLET | Freq: Two times a day (BID) | ORAL | Status: DC
  Administered 2016-10-17 – 2016-10-18 (×2): 8.6 mg via ORAL
  Filled 2016-10-17 (×2): qty 1

## 2016-10-17 MED ORDER — MIDAZOLAM HCL 5 MG/5ML IJ SOLN
INTRAMUSCULAR | Status: DC | PRN
  Administered 2016-10-17: 2 mg via INTRAVENOUS

## 2016-10-17 MED ORDER — CHLORHEXIDINE GLUCONATE CLOTH 2 % EX PADS: 6.0000 | MEDICATED_PAD | Freq: Once | CUTANEOUS | Status: DC

## 2016-10-17 MED ORDER — MORPHINE SULFATE (PF) 4 MG/ML IV SOLN
INTRAVENOUS | Status: AC
  Filled 2016-10-17: qty 1

## 2016-10-17 MED ORDER — BENAZEPRIL HCL 20 MG PO TABS
40.0000 mg | ORAL_TABLET | Freq: Every day | ORAL | Status: DC
  Administered 2016-10-17 – 2016-10-18 (×2): 40 mg via ORAL
  Filled 2016-10-17 (×2): qty 2

## 2016-10-17 MED ORDER — FENTANYL CITRATE (PF) 250 MCG/5ML IJ SOLN
INTRAMUSCULAR | Status: AC
  Filled 2016-10-17: qty 5

## 2016-10-17 MED ORDER — CARVEDILOL 25 MG PO TABS
25.0000 mg | ORAL_TABLET | Freq: Two times a day (BID) | ORAL | Status: DC
  Administered 2016-10-17 – 2016-10-18 (×2): 25 mg via ORAL
  Filled 2016-10-17 (×2): qty 1

## 2016-10-17 MED ORDER — INSULIN GLARGINE 100 UNIT/ML ~~LOC~~ SOLN
45.0000 [IU] | Freq: Every day | SUBCUTANEOUS | Status: DC
  Administered 2016-10-17: 45 [IU] via SUBCUTANEOUS
  Filled 2016-10-17 (×2): qty 0.45

## 2016-10-17 MED ORDER — CEFAZOLIN SODIUM-DEXTROSE 2-4 GM/100ML-% IV SOLN
INTRAVENOUS | Status: AC
  Filled 2016-10-17: qty 100

## 2016-10-17 MED ORDER — KETOROLAC TROMETHAMINE 15 MG/ML IJ SOLN
15.0000 mg | Freq: Four times a day (QID) | INTRAMUSCULAR | Status: AC
  Administered 2016-10-17 – 2016-10-18 (×4): 15 mg via INTRAVENOUS
  Filled 2016-10-17 (×3): qty 1

## 2016-10-17 MED ORDER — SODIUM CHLORIDE 0.9% FLUSH: 3.0000 mL | INTRAVENOUS | Status: DC | PRN

## 2016-10-17 MED ORDER — ACETAMINOPHEN 325 MG PO TABS: 650.0000 mg | ORAL_TABLET | ORAL | Status: DC | PRN

## 2016-10-17 MED ORDER — ROCURONIUM BROMIDE 50 MG/5ML IV SOSY
PREFILLED_SYRINGE | INTRAVENOUS | Status: AC
  Filled 2016-10-17: qty 5

## 2016-10-17 MED ORDER — LIDOCAINE-EPINEPHRINE 1 %-1:100000 IJ SOLN
INTRAMUSCULAR | Status: AC
  Filled 2016-10-17: qty 1

## 2016-10-17 MED ORDER — THROMBIN 20000 UNITS EX SOLR
CUTANEOUS | Status: AC
  Filled 2016-10-17: qty 20000

## 2016-10-17 MED ORDER — POLYETHYLENE GLYCOL 3350 17 G PO PACK: 17.0000 g | PACK | Freq: Every day | ORAL | Status: DC | PRN

## 2016-10-17 MED ORDER — FENTANYL CITRATE (PF) 100 MCG/2ML IJ SOLN
25.0000 ug | INTRAMUSCULAR | Status: DC | PRN
  Administered 2016-10-17: 50 ug via INTRAVENOUS
  Administered 2016-10-17 (×2): 25 ug via INTRAVENOUS

## 2016-10-17 MED ORDER — ALBUTEROL SULFATE HFA 108 (90 BASE) MCG/ACT IN AERS
INHALATION_SPRAY | RESPIRATORY_TRACT | Status: DC | PRN
  Administered 2016-10-17 (×2): 4 via RESPIRATORY_TRACT

## 2016-10-17 MED ORDER — LEVOTHYROXINE SODIUM 75 MCG PO TABS
150.0000 ug | ORAL_TABLET | Freq: Every day | ORAL | Status: DC
  Administered 2016-10-18: 150 ug via ORAL
  Filled 2016-10-17: qty 2

## 2016-10-17 MED ORDER — ADULT MULTIVITAMIN W/MINERALS CH
1.0000 | ORAL_TABLET | Freq: Every day | ORAL | Status: DC
  Administered 2016-10-18: 1 via ORAL
  Filled 2016-10-17: qty 1

## 2016-10-17 MED ORDER — 0.9 % SODIUM CHLORIDE (POUR BTL) OPTIME
TOPICAL | Status: DC | PRN
  Administered 2016-10-17: 1000 mL

## 2016-10-17 MED ORDER — MORPHINE SULFATE ER 30 MG PO TBCR
60.0000 mg | EXTENDED_RELEASE_TABLET | Freq: Two times a day (BID) | ORAL | Status: DC
  Administered 2016-10-17 – 2016-10-18 (×2): 60 mg via ORAL
  Filled 2016-10-17 (×2): qty 2

## 2016-10-17 MED ORDER — FLEET ENEMA 7-19 GM/118ML RE ENEM: 1.0000 | ENEMA | Freq: Once | RECTAL | Status: DC | PRN

## 2016-10-17 MED ORDER — PRAVASTATIN SODIUM 20 MG PO TABS
20.0000 mg | ORAL_TABLET | Freq: Every day | ORAL | Status: DC
  Administered 2016-10-17 – 2016-10-18 (×2): 20 mg via ORAL
  Filled 2016-10-17 (×2): qty 1

## 2016-10-17 MED ORDER — VITAMIN E 180 MG (400 UNIT) PO CAPS
400.0000 [IU] | ORAL_CAPSULE | Freq: Every day | ORAL | Status: DC
  Administered 2016-10-18: 400 [IU] via ORAL
  Filled 2016-10-17: qty 1

## 2016-10-17 MED ORDER — DOCUSATE SODIUM 100 MG PO CAPS
100.0000 mg | ORAL_CAPSULE | Freq: Two times a day (BID) | ORAL | Status: DC
  Administered 2016-10-17 – 2016-10-18 (×2): 100 mg via ORAL
  Filled 2016-10-17 (×2): qty 1

## 2016-10-17 MED ORDER — ROCURONIUM BROMIDE 50 MG/5ML IV SOSY
PREFILLED_SYRINGE | INTRAVENOUS | Status: DC | PRN
  Administered 2016-10-17: 50 mg via INTRAVENOUS

## 2016-10-17 MED ORDER — METOCLOPRAMIDE HCL 5 MG/ML IJ SOLN
10.0000 mg | Freq: Once | INTRAMUSCULAR | Status: DC | PRN
Start: 2016-10-17 — End: 2016-10-17

## 2016-10-17 MED ORDER — SUCCINYLCHOLINE CHLORIDE 200 MG/10ML IV SOSY
PREFILLED_SYRINGE | INTRAVENOUS | Status: AC
  Filled 2016-10-17: qty 10

## 2016-10-17 MED ORDER — CHLORTHALIDONE 25 MG PO TABS
25.0000 mg | ORAL_TABLET | Freq: Every day | ORAL | Status: DC
  Administered 2016-10-17 – 2016-10-18 (×2): 25 mg via ORAL
  Filled 2016-10-17 (×2): qty 1

## 2016-10-17 MED ORDER — ACETAMINOPHEN 650 MG RE SUPP: 650.0000 mg | RECTAL | Status: DC | PRN

## 2016-10-17 MED ORDER — SODIUM CHLORIDE 0.9 % IV SOLN
250.0000 mL | INTRAVENOUS | Status: DC
  Administered 2016-10-17: 250 mL via INTRAVENOUS

## 2016-10-17 MED ORDER — BUPIVACAINE HCL 0.5 % IJ SOLN
INTRAMUSCULAR | Status: DC | PRN
  Administered 2016-10-17: 10 mL

## 2016-10-17 MED ORDER — MEPERIDINE HCL 25 MG/ML IJ SOLN: 6.2500 mg | INTRAMUSCULAR | Status: DC | PRN

## 2016-10-17 MED ORDER — SUGAMMADEX SODIUM 200 MG/2ML IV SOLN
INTRAVENOUS | Status: DC | PRN
Start: 2016-10-17 — End: 2016-10-17
  Administered 2016-10-17: 200 mg via INTRAVENOUS

## 2016-10-17 MED ORDER — METFORMIN HCL 500 MG PO TABS
500.0000 mg | ORAL_TABLET | Freq: Two times a day (BID) | ORAL | Status: DC
  Administered 2016-10-17 – 2016-10-18 (×3): 500 mg via ORAL
  Filled 2016-10-17 (×4): qty 1

## 2016-10-17 MED ORDER — OXYCODONE HCL 5 MG PO TABS
10.0000 mg | ORAL_TABLET | Freq: Three times a day (TID) | ORAL | Status: DC | PRN
  Administered 2016-10-17 – 2016-10-18 (×2): 10 mg via ORAL
  Filled 2016-10-17 (×2): qty 2

## 2016-10-17 MED ORDER — FLUTICASONE PROPIONATE 50 MCG/ACT NA SUSP
2.0000 | Freq: Every day | NASAL | Status: DC | PRN
  Filled 2016-10-17: qty 16

## 2016-10-17 MED ORDER — THROMBIN 5000 UNITS EX SOLR
CUTANEOUS | Status: AC
  Filled 2016-10-17: qty 5000

## 2016-10-17 MED ORDER — CRANBERRY 500 MG PO CAPS: ORAL_CAPSULE | Freq: Every day | ORAL | Status: DC

## 2016-10-17 SURGICAL SUPPLY — 76 items
BAG DECANTER FOR FLEXI CONT (MISCELLANEOUS) ×2 IMPLANT
BASKET BONE COLLECTION (BASKET) ×2 IMPLANT
BENZOIN TINCTURE PRP APPL 2/3 (GAUZE/BANDAGES/DRESSINGS) IMPLANT
BLADE CLIPPER SURG (BLADE) IMPLANT
BLADE SURG 11 STRL SS (BLADE) ×2 IMPLANT
BLADE ULTRA TIP 2M (BLADE) IMPLANT
BNDG GAUZE ELAST 4 BULKY (GAUZE/BANDAGES/DRESSINGS) IMPLANT
BUR MATCHSTICK NEURO 3.0 LAGG (BURR) ×2 IMPLANT
BUR ROUND FLUTED 4 SOFT TCH (BURR) ×2 IMPLANT
CAGE PEEK 6X14X11 (Cage) ×1 IMPLANT
CANISTER SUCT 3000ML PPV (MISCELLANEOUS) ×2 IMPLANT
CARTRIDGE OIL MAESTRO DRILL (MISCELLANEOUS) ×1 IMPLANT
DECANTER SPIKE VIAL GLASS SM (MISCELLANEOUS) ×2 IMPLANT
DERMABOND ADVANCED (GAUZE/BANDAGES/DRESSINGS) ×1
DERMABOND ADVANCED .7 DNX12 (GAUZE/BANDAGES/DRESSINGS) ×1 IMPLANT
DIFFUSER DRILL AIR PNEUMATIC (MISCELLANEOUS) ×2 IMPLANT
DRAIN CHANNEL 10M FLAT 3/4 FLT (DRAIN) IMPLANT
DRAPE C-ARM 42X72 X-RAY (DRAPES) ×4 IMPLANT
DRAPE HALF SHEET 40X57 (DRAPES) IMPLANT
DRAPE LAPAROTOMY 100X72 PEDS (DRAPES) ×2 IMPLANT
DRAPE MICROSCOPE LEICA (MISCELLANEOUS) ×2 IMPLANT
DRAPE POUCH INSTRU U-SHP 10X18 (DRAPES) ×2 IMPLANT
DRSG OPSITE 4X5.5 SM (GAUZE/BANDAGES/DRESSINGS) ×2 IMPLANT
DRSG OPSITE POSTOP 3X4 (GAUZE/BANDAGES/DRESSINGS) ×4 IMPLANT
DURAPREP 6ML APPLICATOR 50/CS (WOUND CARE) ×2 IMPLANT
ELECT COATED BLADE 2.86 ST (ELECTRODE) ×2 IMPLANT
ELECT REM PT RETURN 9FT ADLT (ELECTROSURGICAL) ×2
ELECTRODE REM PT RTRN 9FT ADLT (ELECTROSURGICAL) ×1 IMPLANT
EVACUATOR SILICONE 100CC (DRAIN) IMPLANT
GAUZE SPONGE 4X4 16PLY XRAY LF (GAUZE/BANDAGES/DRESSINGS) IMPLANT
GLOVE BIO SURGEON STRL SZ7 (GLOVE) ×2 IMPLANT
GLOVE BIO SURGEON STRL SZ8 (GLOVE) ×2 IMPLANT
GLOVE BIOGEL PI IND STRL 6.5 (GLOVE) ×1 IMPLANT
GLOVE BIOGEL PI IND STRL 7.0 (GLOVE) IMPLANT
GLOVE BIOGEL PI IND STRL 7.5 (GLOVE) ×1 IMPLANT
GLOVE BIOGEL PI INDICATOR 6.5 (GLOVE) ×1
GLOVE BIOGEL PI INDICATOR 7.0 (GLOVE)
GLOVE BIOGEL PI INDICATOR 7.5 (GLOVE) ×1
GLOVE ECLIPSE 7.0 STRL STRAW (GLOVE) ×2 IMPLANT
GLOVE EXAM NITRILE LRG STRL (GLOVE) IMPLANT
GLOVE EXAM NITRILE XL STR (GLOVE) IMPLANT
GLOVE EXAM NITRILE XS STR PU (GLOVE) IMPLANT
GLOVE INDICATOR 6.5 STRL GRN (GLOVE) ×2 IMPLANT
GLOVE INDICATOR 7.5 STRL GRN (GLOVE) ×2 IMPLANT
GOWN STRL REUS W/ TWL LRG LVL3 (GOWN DISPOSABLE) ×2 IMPLANT
GOWN STRL REUS W/ TWL XL LVL3 (GOWN DISPOSABLE) IMPLANT
GOWN STRL REUS W/TWL 2XL LVL3 (GOWN DISPOSABLE) IMPLANT
GOWN STRL REUS W/TWL LRG LVL3 (GOWN DISPOSABLE) ×2
GOWN STRL REUS W/TWL XL LVL3 (GOWN DISPOSABLE)
HEMOSTAT POWDER KIT SURGIFOAM (HEMOSTASIS) ×2 IMPLANT
KIT BASIN OR (CUSTOM PROCEDURE TRAY) ×2 IMPLANT
KIT ROOM TURNOVER OR (KITS) ×2 IMPLANT
NEEDLE HYPO 25X1 1.5 SAFETY (NEEDLE) ×2 IMPLANT
NEEDLE SPNL 22GX3.5 QUINCKE BK (NEEDLE) ×2 IMPLANT
NS IRRIG 1000ML POUR BTL (IV SOLUTION) ×2 IMPLANT
OIL CARTRIDGE MAESTRO DRILL (MISCELLANEOUS) ×2
PACK LAMINECTOMY NEURO (CUSTOM PROCEDURE TRAY) ×2 IMPLANT
PAD ARMBOARD 7.5X6 YLW CONV (MISCELLANEOUS) ×6 IMPLANT
PEEK ANATOMIC STRUT 5X14X11MM (Peek) ×2 IMPLANT
PLATE 2 40XLCK NS SPNE CVD (Plate) ×1 IMPLANT
PLATE 2 ATLANTIS TRANS (Plate) ×1 IMPLANT
RUBBERBAND STERILE (MISCELLANEOUS) ×4 IMPLANT
SCREW SELF TAP VAR 4.0X13 (Screw) ×8 IMPLANT
SPACER PEEK CERV 14X11X13 (Spacer) ×2 IMPLANT
SPACER SPNL 11X14X6XPEEK CVD (Cage) ×1 IMPLANT
SPCR SPNL 11X14X6XPEEK CVD (Cage) ×1 IMPLANT
SPONGE INTESTINAL PEANUT (DISPOSABLE) ×2 IMPLANT
SPONGE SURGIFOAM ABS GEL 100 (HEMOSTASIS) ×2 IMPLANT
STRIP CLOSURE SKIN 1/2X4 (GAUZE/BANDAGES/DRESSINGS) IMPLANT
SUT ETHILON 3 0 FSL (SUTURE) IMPLANT
SUT VIC AB 3-0 SH 8-18 (SUTURE) ×2 IMPLANT
SUT VICRYL 3-0 RB1 18 ABS (SUTURE) ×2 IMPLANT
TOWEL GREEN STERILE (TOWEL DISPOSABLE) ×2 IMPLANT
TOWEL GREEN STERILE FF (TOWEL DISPOSABLE) ×2 IMPLANT
TRAP SPECIMEN MUCOUS 40CC (MISCELLANEOUS) ×2 IMPLANT
WATER STERILE IRR 1000ML POUR (IV SOLUTION) ×2 IMPLANT

## 2016-10-17 NOTE — Progress Notes (Signed)
Patient stated that she has several areas on buttocks, where she has some 'lumps' or sores.  She denies any fever, drainage, etc. Decision made to not place pressure pad on coccyx area.

## 2016-10-17 NOTE — Anesthesia Procedure Notes (Signed)
Procedure Name: Intubation Date/Time: 10/17/2016 10:33 AM Performed by: Myna Bright Pre-anesthesia Checklist: Patient identified, Emergency Drugs available, Suction available and Patient being monitored Patient Re-evaluated:Patient Re-evaluated prior to inductionOxygen Delivery Method: Circle system utilized Preoxygenation: Pre-oxygenation with 100% oxygen Intubation Type: IV induction Ventilation: Mask ventilation without difficulty Laryngoscope Size: Mac and 3 Grade View: Grade I Tube type: Oral Tube size: 7.0 mm Number of attempts: 1 Airway Equipment and Method: Stylet Placement Confirmation: positive ETCO2,  ETT inserted through vocal cords under direct vision and breath sounds checked- equal and bilateral Secured at: 21 cm Tube secured with: Tape Dental Injury: Teeth and Oropharynx as per pre-operative assessment

## 2016-10-17 NOTE — Transfer of Care (Signed)
Immediate Anesthesia Transfer of Care Note  Patient: Natalie Macdonald  Procedure(s) Performed: Procedure(s): ANTERIOR CERVICAL DECOMPRESSION/DISCECTOMY FUSION CERVICAL FIVE- CERVICAL SIX, CERVICAL SIX- CERVICAL SEVEN, POSSIBLE CERVICAL SIX CORPECTOMY (N/A)  Patient Location: PACU  Anesthesia Type:General  Level of Consciousness: awake, alert , oriented and patient cooperative  Airway & Oxygen Therapy: Patient Spontanous Breathing and Patient connected to face mask oxygen  Post-op Assessment: Report given to RN, Post -op Vital signs reviewed and stable and Patient moving all extremities  Post vital signs: Reviewed and stable  Last Vitals:  Vitals:   10/17/16 0850  BP: (!) 157/95  Pulse: 88  Resp: 20  Temp: 36.9 C    Last Pain:  Vitals:   10/17/16 0850  TempSrc: Oral         Complications: No apparent anesthesia complications

## 2016-10-17 NOTE — Anesthesia Preprocedure Evaluation (Signed)
Anesthesia Evaluation  Patient identified by MRN, date of birth, ID band Patient awake    Reviewed: Allergy & Precautions, NPO status , Patient's Chart, lab work & pertinent test results  Airway Mallampati: II  TM Distance: >3 FB Neck ROM: Full    Dental no notable dental hx. (+) Edentulous Upper   Pulmonary neg pulmonary ROS,  sarcoidosis   Pulmonary exam normal breath sounds clear to auscultation       Cardiovascular hypertension, Pt. on medications Normal cardiovascular exam Rhythm:Regular Rate:Normal     Neuro/Psych negative psych ROS   GI/Hepatic negative GI ROS, Neg liver ROS,   Endo/Other  diabetes, Type 2, Insulin DependentHypothyroidism   Renal/GU negative Renal ROS  negative genitourinary   Musculoskeletal  (+) Fibromyalgia -  Abdominal   Peds negative pediatric ROS (+)  Hematology negative hematology ROS (+)   Anesthesia Other Findings L sided weakness  Reproductive/Obstetrics negative OB ROS                            Anesthesia Physical Anesthesia Plan  ASA: III  Anesthesia Plan: General   Post-op Pain Management:    Induction: Intravenous  Airway Management Planned: Oral ETT  Additional Equipment:   Intra-op Plan:   Post-operative Plan: Extubation in OR  Informed Consent: I have reviewed the patients History and Physical, chart, labs and discussed the procedure including the risks, benefits and alternatives for the proposed anesthesia with the patient or authorized representative who has indicated his/her understanding and acceptance.   Dental advisory given  Plan Discussed with: CRNA  Anesthesia Plan Comments:         Anesthesia Quick Evaluation

## 2016-10-17 NOTE — Op Note (Signed)
PREOP DIAGNOSIS: Cervical Spondylosis with myelopathy, C5-6, C6-7  POSTOP DIAGNOSIS: Same  PROCEDURE: 1. Corpectomy at C6 (>50% of body) for decompression of spinal cord 2. Placement of intervertebral biomechanical device, 48mm PEEK Medtronic cage 3. Placement of anterior instrumentation consisting of interbody plate and screws spanning C5-C7, Medtronic Atlantis translational plate 4. Use of morselized bone autograft 5. Arthrodesis C5-C7, anterior interbody technique  6. Use of intraoperative microscope  SURGEON: Dr. Consuella Lose, MD  ASSISTANT: Dr. Erline Levine, MD  ANESTHESIA: General Endotracheal  EBL: 75cc  SPECIMENS: None  DRAINS: None  COMPLICATIONS: None immediate  CONDITION: Stable to PACU  HISTORY: Natalie Macdonald is a 61 y.o. presenting to the outpatient clinic with severe weakness and MRI demonstrating large disc herniations behind C5-6 and C6-7. Treatment options were discussed and she presents for surgical decompression and fusion. Risks and benefits of the surgery were reviewed in detail with the patient. After all questions were answered, consent was obtained.  PROCEDURE IN DETAIL: The patient was brought to the operating room and transferred to the operative table. After induction of general anesthesia, the patient was positioned on the operative table in the supine position with all pressure points meticulously padded. The skin of the neck was then prepped and draped in the usual sterile fashion.  After timeout was conducted, the skin was infiltrated with local anesthetic. Skin incision was then made sharply and Bovie electrocautery was used to dissect the subcutaneous tissue until the platysma was identified. The platysma was then divided and undermined. The sternocleidomastoid muscle was then identified and, utilizing natural fascial planes in the neck, the prevertebral fascia was identified and the carotid sheath was retracted laterally and the trachea and  esophagus retracted medially. Again using fluoroscopy, the correct disc spaces were identified. Bovie electrocautery was used to dissect in the subperiosteal plane and elevate the bilateral longus coli muscles. Table mounted retractors were then placed. At this point, the microscope was draped and brought into the field, and the remainder of the case was done under the microscope using microdissecting technique.  The C6-7 disc space was incised sharply and rongeurs were used to initially complete a discectomy. The high-speed drill was then used to complete discectomy until the posterior annulus was identified. In a similar fashion, the C5-6 space was incised and discectomy completed with rongeurs and the drill to identify the PLL. The C6 vertebral body was then drill away and collected for use as autograft during fusion. The PLL was removed piecemeal with Kerrison rongeurs. A large central partially calcified disc herniation was noted behind the superior portion of the body of C6. Once the disc herniation and PLL were removed, good decompression was confirmed with a dissector.  At this point, a 79mm PEEK interbody cage was sized and packed with morcellized bone autograft. This was then inserted and tapped into place. Position was confirmed with fluoro.  After placement of the intervertebral devices, the a 64mm anterior cervical plate was selected, and placed across the interspaces. Using a high-speed drill, the cortex of the cervical vertebral bodies was punctured, and screws inserted in the C5 and C7 levels. Final fluoroscopic images in AP and lateral projections were taken to confirm good hardware placement.  At this point, after all counts were verified to be correct, meticulous hemostasis was secured using a combination of bipolar electrocautery and passive hemostatics. The platysma muscle was then closed using interrupted 3-0 Vicryl sutures, and the skin was closed with a running subcuticular stitch.  Sterile dressings were then  applied and the drapes removed.  The patient tolerated the procedure well and was extubated in the room and taken to the postanesthesia care unit in stable condition.

## 2016-10-17 NOTE — Progress Notes (Signed)
Pt arrived to unit from PACU at 16:15pm with husband and daughter. Pt irritated and agitated as soon as she arrived to her room. Her husband immediately stated, " Who do I need to speak to get my wife some pain medicine!" Orders released administered 10 mg of Oxycodone per prn order.  During assessment pt stated that she was in more pain, administered  Morphine prn but she stated that she still didn't feel relief because she was too hungry. Meal ordered x2 apologized but  pt upset because staff had to call twice. This nurse provided happy meal from unit, graham crackers, gingerale and coffee for her husband.  Pt  husband with  threatening tone towards staff. He stated that he was going to call the CEO of the hospital because she only had 3 pillows when she arrived in her room. Extra pillows provided for pt and husband. Staff made all efforts to make pt and family happy but her husband stated that he was unhappy how they were treated prior to her surgery and now there weren't enough pillows.  Assessment completed pt with no noted distress. Pt oriented to room. Safety measures in place. Call bell within reach. Surgical dressing site clean dry and intact.

## 2016-10-17 NOTE — H&P (Signed)
CC:  No chief complaint on file.   HPI: Natalie Macdonald is a 61 year old woman I am seeing for the above. She comes in with a primary complaint of left-sided weakness which began fairly suddenly about 1 year ago back in March of 2017. Prior to that, she does report some neck pain and "popping in her neck." Unfortunately she centrally woke up about a year ago with severe weakness of the left side including the arm and leg. At that time she was worked up for possible stroke which was apparently negative. She has essentially been wheelchair bound since that time because of the left-sided weakness. She describes worsening pain in her neck, with radiation primarily down the left arm and involving the left hand. More recently, over the last few months, she has also began to notice some numbness and tingling involving the right arm and hand. She has not noted any right leg symptoms. She denies any changes in bowel or bladder function.   PMH: Past Medical History:  Diagnosis Date  . Allergic rhinitis   . Arthritis    knees, shoulder  . Bronchitis    history  . Cellulitis of left foot   . Cervical disc disorder   . 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 ([place])   . Chronic osteomyelitis ([place])    left foot  . Chronic respiratory infection   . Diabetes mellitus, type II ([place])   . Diabetic foot ulcer ([place])    left foot  . Fibromyalgia   . GERD (gastroesophageal reflux disease)   . Headache    sinus  . History of blood transfusion   . Hypertension   . Hyponatremia   . Hypothyroidism   . Joint pain    bilateral legs  . Left hemiplegia ([place])   . Sarcoidosis   . Sepsis ([place])    HX  . Uterine fibroid    hx    PSH: Past Surgical History:  Procedure Laterality Date  . APPLICATION OF WOUND VAC Left    foot  . CHOLECYSTECTOMY    . FOOT SURGERY Bilateral    implants  . IRRIGATION AND DEBRIDEMENT FOOT Left    X2  . KNEE ARTHROPLASTY    . THYROIDECTOMY    . UTERINE FIBROID  SURGERY      SH: Social History  Substance Use Topics  . Smoking status: Never Smoker  . Smokeless tobacco: Never Used  . Alcohol use No    MEDS: Prior to Admission medications   Medication Sig Start Date End Date Taking? Authorizing Provider  amitriptyline (ELAVIL) 100 MG tablet Take 100 mg by mouth at bedtime.    Yes Historical Provider, MD  amLODipine-benazepril (LOTREL) 5-40 MG capsule Take 1 capsule by mouth daily.   Yes Historical Provider, MD  baclofen (LIORESAL) 10 MG tablet Take 10 mg by mouth 3 (three) times daily. 10/03/16  Yes Historical Provider, MD  Calcium Carbonate (CALCIUM 600 PO) Take 600 mg by mouth daily.   Yes Historical Provider, MD  carvedilol (COREG) 25 MG tablet Take 25 mg by mouth 2 (two) times daily with a meal.   Yes Historical Provider, MD  cetirizine (ZYRTEC) 10 MG tablet Take 10 mg by mouth daily.   Yes Historical Provider, MD  chlorthalidone (HYGROTON) 25 MG tablet Take 25 mg by mouth daily.   Yes Historical Provider, MD  CRANBERRY PO Take 2 capsules by mouth daily.   Yes Historical Provider, MD  fluticasone (FLONASE) 50 MCG/ACT nasal spray Place  2 sprays into both nostrils daily as needed for allergies.    Yes Historical Provider, MD  gabapentin (NEURONTIN) 300 MG capsule Take 300 mg by mouth 3 (three) times daily as needed (for pain).    Yes Historical Provider, MD  insulin glargine (LANTUS) 100 UNIT/ML injection Inject 45 Units into the skin at bedtime.    Yes Historical Provider, MD  insulin glulisine (APIDRA) 100 UNIT/ML injection Inject 18 Units into the skin 3 (three) times daily before meals.    Yes Historical Provider, MD  levothyroxine (SYNTHROID, LEVOTHROID) 150 MCG tablet Take 150 mcg by mouth daily before breakfast.   Yes Historical Provider, MD  metFORMIN (GLUCOPHAGE) 500 MG tablet Take 500 mg by mouth 2 (two) times daily with a meal.   Yes Historical Provider, MD  morphine (MS CONTIN) 60 MG 12 hr tablet Take 60 mg by mouth 2 (two) times daily.  10/03/16  Yes Historical Provider, MD  Multiple Vitamin (MULTIVITAMIN) tablet Take 1 tablet by mouth daily.   Yes Historical Provider, MD  Oxycodone HCl 10 MG TABS Take 10 mg by mouth every 8 (eight) hours as needed (for pain).  10/04/16  Yes Historical Provider, MD  pantoprazole (PROTONIX) 40 MG tablet Take 40 mg by mouth daily.   Yes Historical Provider, MD  pravastatin (PRAVACHOL) 20 MG tablet Take 20 mg by mouth daily.   Yes Historical Provider, MD  Probiotic Product (PROBIOTIC PO) Take 2 capsules by mouth daily.   Yes Historical Provider, MD  ranitidine (ZANTAC) 75 MG tablet Take 150 mg by mouth daily as needed for heartburn.   Yes Historical Provider, MD  vitamin E 400 UNIT capsule Take 400 Units by mouth daily.   Yes Historical Provider, MD  albuterol (PROVENTIL HFA;VENTOLIN HFA) 108 (90 Base) MCG/ACT inhaler Inhale 2 puffs into the lungs every 6 (six) hours as needed for wheezing or shortness of breath.     Historical Provider, MD  montelukast (SINGULAIR) 10 MG tablet Take 10 mg by mouth at bedtime.    Historical Provider, MD    ALLERGY: Allergies  Allergen Reactions  . Grapefruit Bioflavonoid Complex Hives, Swelling and Other (See Comments)    Caused her to get hives, swelling of the eye, throat   . Procaine Anaphylaxis    ROS: ROS  NEUROLOGIC EXAM: Awake, alert, oriented Memory and concentration grossly intact Speech fluent, appropriate CN grossly intact Motor exam: Upper Extremities Deltoid Bicep Tricep Grip  Right 5/5 5/5 5/5 5/5  Left 2/5 4/5 4/5 3/5   Lower Extremity IP Quad PF DF EHL  Right 5/5 5/5 5/5 5/5 5/5  Left 3/5 2/5 1/5 1/5 1/5   Sensation grossly intact to LT  [facility]: MRI of the cervical spine was reviewed. This demonstrates very large disc herniation centrally at C5-6 with severe compression of the spinal cord. Herniated disc fragment appears to extend down to about the level of the mid C6 body. There is also broad-based disc herniation at C6-7, with  resultant severe stenosis at this level as well. There is intrinsic T2 cord signal change at the C6 level.  IMPRESSION: 61 year old woman with left hemi paresis likely related to large chronic disc herniation with spinal cord compression at C5-6 and C6-7. Given her significant hemi paresis and degree of spinal cord compression, she certainly needs surgical decompression.  PLAN: We will plan on proceeding with C5-6 C6-7 ACDF, possible C6 corpectomy and fusion   I did review the MRI images and findings with the patient and her  daughter in the office. The risks of surgery were discussed in detail with the patient which include but are not limited to spinal cord injury which may result in hand, leg, and bowel dysfunction, postoperative dysphagia, dysphonia, neck hematoma, or subsequent surgery for epidural hematoma. The risk of CSF leak was also discussed. In addition, I explained to him that after spinal fusion surgery, there is a risk of adjacent level disease requiring future surgical intervention. The possibility of continued/worsening pain, numbness, tingling, weakness after surgery was also discussed. The general risks of anesthesia were also reviewed including heart attack, stroke, and DVT/PE.   The patient understood our discussion as well as the risks of the surgery and is willing to proceed. All questions were answered.

## 2016-10-18 ENCOUNTER — Encounter (HOSPITAL_COMMUNITY): Payer: Self-pay | Admitting: Neurosurgery

## 2016-10-18 DIAGNOSIS — M4712 Other spondylosis with myelopathy, cervical region: Secondary | ICD-10-CM | POA: Diagnosis not present

## 2016-10-18 DIAGNOSIS — L8990 Pressure ulcer of unspecified site, unspecified stage: Secondary | ICD-10-CM | POA: Insufficient documentation

## 2016-10-18 LAB — GLUCOSE, CAPILLARY
GLUCOSE-CAPILLARY: 345 mg/dL — AB (ref 65–99)
GLUCOSE-CAPILLARY: 239 mg/dL — AB (ref 65–99)

## 2016-10-18 MED ORDER — CEFAZOLIN SODIUM-DEXTROSE 2-4 GM/100ML-% IV SOLN
2.0000 g | INTRAVENOUS | Status: AC
  Administered 2016-10-18: 2 g via INTRAVENOUS
  Filled 2016-10-18: qty 100

## 2016-10-18 NOTE — Care Management Obs Status (Signed)
[facility] NOTIFICATION   Patient Details  Name: Natalie Macdonald MRN: [id number] Date of Birth: 1955-10-15   Medicare Observation Status Notification Given:  Yes    Pollie Friar, RN 10/18/2016, 11:08 AM

## 2016-10-18 NOTE — Progress Notes (Signed)
Discharge instructions (including medications) discussed with and copy provided to patient/caregiver 

## 2016-10-18 NOTE — Care Management Note (Signed)
Case Management Note  Patient Details  Name: Natalie Macdonald MRN: [id number] Date of Birth: [birth year]  Subjective/Objective:                    Action/Plan: Pt discharging home with self care. Pt states she has someone to assist her at home and transportation home. No further needs per CM.   Expected Discharge Date:  10/18/16               Expected Discharge Plan:  Home/Self Care  In-House Referral:     Discharge planning Services     Post Acute Care Choice:    Choice offered to:     DME Arranged:    DME Agency:     HH Arranged:    HH Agency:     Status of Service:  Completed, signed off  If discussed at [employer] of Stay Meetings, dates discussed:    Additional Comments:  Pollie Friar, RN 10/18/2016, 11:10 AM

## 2016-10-18 NOTE — Anesthesia Postprocedure Evaluation (Signed)
Anesthesia Post Note  Patient: Zena Vitelli  Procedure(s) Performed: Procedure(s) (LRB): ANTERIOR CERVICAL DECOMPRESSION/DISCECTOMY FUSION CERVICAL FIVE- CERVICAL SIX, CERVICAL SIX- CERVICAL SEVEN, POSSIBLE CERVICAL SIX CORPECTOMY (N/A)  Patient location during evaluation: PACU Anesthesia Type: General Level of consciousness: awake and alert Pain management: pain level controlled Vital Signs Assessment: post-procedure vital signs reviewed and stable Respiratory status: spontaneous breathing, nonlabored ventilation, respiratory function stable and patient connected to nasal cannula oxygen Cardiovascular status: blood pressure returned to baseline and stable Postop Assessment: no signs of nausea or vomiting Anesthetic complications: no        Last Vitals:  Vitals:   10/18/16 0123 10/18/16 0518  BP: 136/76 (!) 161/81  Pulse: 97 87  Resp: 16 16  Temp: 36.4 C 36.8 C    Last Pain:  Vitals:   10/18/16 0633  TempSrc:   PainSc: 7    Pain Goal: Patients Stated Pain Goal: 4 (10/18/16 0312)               Montez Hageman

## 2016-10-18 NOTE — Progress Notes (Signed)
Patient continues to complain of pain throughout entire shift and requires frequent contact by staff.  Patient states "I did not sleep, hopefully I will get some sleep today." Patient foley removed this am per order, did not want to get OOB to chair as pt is wheelchair bound at home and can not ambulate at this time.  Several stage II noted on buttocks during assessment, patient states MD is aware.  Continue to monitor patient.

## 2016-10-18 NOTE — Progress Notes (Signed)
No issues overnight. Pt cont to c/o pain, but this is chronic. Swallowing well, tolerating diet. No new weakness, baseline left hemiparesis  EXAM:  BP (!) 161/81 (BP Location: Right Arm)   Pulse 87   Temp 98.2 F (36.8 C) (Oral)   Resp 16   Ht 5\' 6"  (1.676 m)   Wt 116.2 kg (256 lb 3.2 oz)   SpO2 98%   BMI 41.35 kg/m   Awake, alert, oriented  Speech fluent, appropriate  CN grossly intact  5/5 on right Dense left hemiparesis stable Wound c/d/i  IMPRESSION:  61 y.o. female POD#1 s/p C6 corpectomy, at baseline  PLAN: - Can d/c home today. Will refer for outpatient PT/OT after first f/u.

## 2016-10-18 NOTE — Discharge Summary (Signed)
Physician Discharge Summary  Patient ID: Natalie Macdonald MRN: [id number] DOB/AGE: [birth year] 61 y.o.  Admit date: 10/17/2016 Discharge date: 10/18/2016  Admission Diagnoses:  Cervical spondylosis with myelopathy  Discharge Diagnoses:  Same Active Problems:   Cervical spondylosis with myelopathy   Pressure injury of skin   Discharged Condition: Stable  Hospital Course:  Natalie Macdonald is a 61 y.o. female admittedafter C6 corpectomy. She was at baseline postop with stable left hemiparesis. She was tolerating diet and voiding. We discussed initiation of outpatient PT/OT after first f/u.  Treatments: Surgery - C6 corpectomy  Discharge Exam: Blood pressure (!) 161/81, pulse 87, temperature 98.2 F (36.8 C), temperature source Oral, resp. rate 16, height 5\' 6"  (1.676 m), weight 116.2 kg (256 lb 3.2 oz), SpO2 98 %. Awake, alert, oriented Speech fluent, appropriate CN grossly intact 5/5 right Left hemiparesis Wound c/d/i  Disposition: Home  Discharge Instructions    Call MD for:  redness, tenderness, or signs of infection (pain, swelling, redness, odor or green/yellow discharge around incision site)    Complete by:  As directed    Call MD for:  temperature >100.4    Complete by:  As directed    Diet - low sodium heart healthy    Complete by:  As directed    Discharge instructions    Complete by:  As directed    Walk at home as much as possible, at least 4 times / day   Increase activity slowly    Complete by:  As directed    Lifting restrictions    Complete by:  As directed    No lifting > 10 lbs   May shower / Bathe    Complete by:  As directed    48 hours after surgery   May walk up steps    Complete by:  As directed    No dressing needed    Complete by:  As directed    Other Restrictions    Complete by:  As directed    No bending/twisting at waist     Allergies as of 10/18/2016      Reactions   Grapefruit Bioflavonoid Complex Hives, Swelling, Other (See  Comments)   Caused her to get hives, swelling of the eye, throat    Procaine Anaphylaxis      Medication List    TAKE these medications   albuterol 108 (90 Base) MCG/ACT inhaler Commonly known as:  PROVENTIL HFA;VENTOLIN HFA Inhale 2 puffs into the lungs every 6 (six) hours as needed for wheezing or shortness of breath.   amitriptyline 100 MG tablet Commonly known as:  ELAVIL Take 100 mg by mouth at bedtime.   amLODipine-benazepril 5-40 MG capsule Commonly known as:  LOTREL Take 1 capsule by mouth daily.   APIDRA 100 UNIT/ML injection Generic drug:  insulin glulisine Inject 18 Units into the skin 3 (three) times daily before meals.   baclofen 10 MG tablet Commonly known as:  LIORESAL Take 10 mg by mouth 3 (three) times daily.   CALCIUM 600 PO Take 600 mg by mouth daily.   carvedilol 25 MG tablet Commonly known as:  COREG Take 25 mg by mouth 2 (two) times daily with a meal.   cetirizine 10 MG tablet Commonly known as:  ZYRTEC Take 10 mg by mouth daily.   chlorthalidone 25 MG tablet Commonly known as:  HYGROTON Take 25 mg by mouth daily.   CRANBERRY PO Take 2 capsules by mouth daily.   fluticasone 50 MCG/ACT nasal  spray Commonly known as:  FLONASE Place 2 sprays into both nostrils daily as needed for allergies.   gabapentin 300 MG capsule Commonly known as:  NEURONTIN Take 300 mg by mouth 3 (three) times daily as needed (for pain).   insulin glargine 100 UNIT/ML injection Commonly known as:  LANTUS Inject 45 Units into the skin at bedtime.   levothyroxine 150 MCG tablet Commonly known as:  SYNTHROID, LEVOTHROID Take 150 mcg by mouth daily before breakfast.   metFORMIN 500 MG tablet Commonly known as:  GLUCOPHAGE Take 500 mg by mouth 2 (two) times daily with a meal.   montelukast 10 MG tablet Commonly known as:  SINGULAIR Take 10 mg by mouth at bedtime.   morphine 60 MG 12 hr tablet Commonly known as:  MS CONTIN Take 60 mg by mouth 2 (two) times  daily.   multivitamin tablet Take 1 tablet by mouth daily.   Oxycodone HCl 10 MG Tabs Take 10 mg by mouth every 8 (eight) hours as needed (for pain).   pantoprazole 40 MG tablet Commonly known as:  PROTONIX Take 40 mg by mouth daily.   pravastatin 20 MG tablet Commonly known as:  PRAVACHOL Take 20 mg by mouth daily.   PROBIOTIC PO Take 2 capsules by mouth daily.   ranitidine 75 MG tablet Commonly known as:  ZANTAC Take 150 mg by mouth daily as needed for heartburn.   vitamin E 400 UNIT capsule Take 400 Units by mouth daily.      Follow-up Information    Tommaso Cavitt, C, MD Follow up in 2 week(s).   Specialty:  Neurosurgery Contact information: [address] [place] [address] [place] [address] [phone]           Signed: Consuella Lose, Loletha Grayer 10/18/2016, 9:21 AM

## 2017-11-13 ENCOUNTER — Encounter: Payer: Self-pay | Admitting: Endocrinology

## 2017-12-04 ENCOUNTER — Encounter: Payer: Self-pay | Admitting: Neurology

## 2017-12-12 ENCOUNTER — Other Ambulatory Visit: Payer: Self-pay | Admitting: *Deleted

## 2017-12-12 DIAGNOSIS — R200 Anesthesia of skin: Secondary | ICD-10-CM

## 2017-12-13 ENCOUNTER — Ambulatory Visit (INDEPENDENT_AMBULATORY_CARE_PROVIDER_SITE_OTHER): Payer: Medicare Other | Admitting: Neurology

## 2017-12-13 DIAGNOSIS — R200 Anesthesia of skin: Secondary | ICD-10-CM

## 2017-12-13 NOTE — Procedures (Addendum)
[facility]  [place], [place]  [place], [place] [address] Tel: [phone] Fax:  [phone] Test Date:  12/13/2017  Patient: Natalie Macdonald DOB: 06/16/1956 Physician: Narda Amber, DO  Sex: Female Height: 5\' 6"  Ref Phys: Vangie Bicker, MD  ID#: [id number] Temp: 37.0C Technician:    Patient Complaints: This is a 62 year old female with history of cervical myelopathy s/p ACDF C5-6 and C6-7 and diabetic neuropathy referred for evaluation of bilateral arm pain, weakness, and paresthesias.  NCV & EMG Findings: Extensive electrodiagnostic testing of the right upper extremity and additional studies of the left shows:  1. Bilateral median sensory responses show reduced amplitude (R2.8 L 6.6 V). Bilateral ulnar sensory responses are absent. Bilateral radial sensory responses are within normal limits. 2. Bilateral median motor responses are within normal limits. Bilateral ulnar motor responses show severely reduced amplitude and conduction velocity slowing along the course of the right nerve and across the elbow on the left; this study was technically challenging due to body habitus. 3. Chronic motor axonal loss changes are seen affecting bilateral first dorsal interosseous, abductor digiti minimi, and flexor carpi ulnaris muscles, without accompanied active denervation. In the left upper extremity, there is a global pattern of incomplete motor unit recruitment is seen by a very poor firing pattern; these findings are most likely due to central disorder of motor unit control.   Impression: 1. Severe and chronic bilateral ulnar neuropathy, which is demyelinating and axon loss in type, localized to the elbow on the left (i.e. left cubital tunnel syndrome) and non-localizable on the right, but at least proximal to the takeoff to the flexor carpi ulnaris muscle.   2. 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    ___________________________ Narda Amber, DO    Nerve Conduction Studies Anti Sensory Summary Table   Site NR Peak (ms) Norm Peak (ms) P-T Amp (V) Norm P-T Amp  Left Median Anti Sensory (2nd Digit)  37C  Wrist    3.8 <3.8 6.6 >10  Right Median Anti Sensory (2nd Digit)  37C  Wrist    3.2 <3.8 2.8 >10  Left Radial Anti Sensory (Base 1st Digit)  37C  Wrist    2.8 <2.8 15.8 >10  Right Radial Anti Sensory (Base 1st Digit)  37C  Wrist    2.6 <2.8 11.7 >10  Left Ulnar Anti Sensory (5th Digit)  37C  Wrist NR  <3.2  >5  Right Ulnar Anti Sensory (5th Digit)  37C  Wrist NR  <3.2  >5   Motor Summary Table   Site NR Onset (ms) Norm Onset (ms) O-P Amp (mV) Norm O-P Amp Site1 Site2 Delta-0 (ms) Dist (cm) Vel (m/s) Norm Vel (m/s)  Left Median Motor (Abd Poll Brev)  37C  Wrist    3.9 <4.0 8.5 >5 Elbow Wrist 5.6 31.0 55 >50  Elbow    9.5  7.5         Right Median Motor (Abd Poll Brev)  37C  Wrist    3.7 <4.0 9.2 >5 Elbow Wrist 6.4 32.0 50 >50  Elbow    10.1  7.9         Left Ulnar Motor (Abd Dig Minimi)  37C  Wrist    3.7 <3.1 2.1 >7 B Elbow Wrist 5.2 30.0 58 >50  B Elbow    8.9  2.1  A Elbow B Elbow 2.2 10.0 45 >50  A Elbow  11.1  1.5         Right Ulnar Motor (Abd Dig Minimi)  37C  Wrist    2.9 <3.1 1.1 >7 B Elbow Wrist 6.4 27.0 42 >50  B Elbow    9.3  0.3  A Elbow B Elbow 3.2 10.0 31 >50  A Elbow    12.5  0.3         Right Ulnar (FDI) Motor (1st DI)  37C    Technically challenged  Wrist    4.1 <4.5 4.5 >7 B Elbow Wrist 5.2 27.0 52 >50  B Elbow    9.3  0.4  A Elbow B Elbow 1.1 10.0 91 >50  A Elbow    10.4  1.7          EMG   Side Muscle Ins Act Fibs Psw Fasc Number Recrt Dur Dur. Amp Amp. Poly Poly. Comment  Right 1stDorInt Nml Nml Nml Nml SMU Rapid All 1+ All 1+ All 1+ ATR  Right Abd Poll Brev Nml Nml Nml Nml Nml Nml Nml Nml Nml Nml Nml Nml N/A  Right Ext Indicis Nml Nml Nml Nml Nml Nml Nml Nml Nml Nml Nml Nml N/A  Right  PronatorTeres Nml Nml Nml Nml Nml Nml Nml Nml Nml Nml Nml Nml N/A  Right Biceps Nml Nml Nml Nml Nml Nml Nml Nml Nml Nml Nml Nml N/A  Right Triceps Nml Nml Nml Nml Nml Nml Nml Nml Nml Nml Nml Nml N/A  Right Deltoid Nml Nml Nml Nml Nml Nml Nml Nml Nml Nml Nml Nml N/A  Right ABD Dig Min Nml Nml Nml Nml SMU Rapid All 1+ All 1+ All 1+ ATR  Right FlexCarpiUln Nml Nml Nml Nml 3- Rapid Most 1+ Many 1+ Nml Nml N/A  Left 1stDorInt Nml Nml Nml Nml 3- Mod-R Many 1+ Many 1+ Nml Nml ATR  Left Abd Poll Brev Nml Nml Nml Nml 1- Mod-V Nml Nml Nml Nml Nml Nml N/A  Left Ext Indicis Nml Nml Nml Nml 1- Mod-V Nml Nml Nml Nml Nml Nml N/A  Left PronatorTeres Nml Nml Nml Nml 1- Mod-V Nml Nml Nml Nml Nml Nml N/A  Left Biceps Nml Nml Nml Nml 1- Mod-V Nml Nml Nml Nml Nml Nml N/A  Left Triceps Nml Nml Nml Nml Nml Nml Nml Nml Nml Nml Nml Nml N/A  Left Deltoid Nml Nml Nml Nml Nml Nml Nml Nml Nml Nml Nml Nml N/A  Left ABD Dig Min Nml Nml Nml Nml 3- Mod-R Many 1+ Many 1+ Many 1+ ATR  Left FlexCarpiUln Nml Nml Nml Nml 3- Mod-R Many 1+ Many 1+ Nml Nml N/A      Waveforms:

## 2017-12-31 ENCOUNTER — Encounter: Payer: Self-pay | Admitting: Neurology

## 2018-01-03 ENCOUNTER — Encounter (HOSPITAL_COMMUNITY): Payer: Self-pay | Admitting: *Deleted

## 2018-01-03 ENCOUNTER — Emergency Department (HOSPITAL_COMMUNITY): Payer: Medicare Other

## 2018-01-03 ENCOUNTER — Other Ambulatory Visit: Payer: Self-pay

## 2018-01-03 ENCOUNTER — Inpatient Hospital Stay (HOSPITAL_COMMUNITY)
Admission: EM | Admit: 2018-01-03 | Discharge: 2018-01-10 | DRG: 871 | Disposition: A | Discharge status: Home / Self Care | Payer: Medicare Other | Attending: Internal Medicine | Admitting: Internal Medicine

## 2018-01-03 ENCOUNTER — Inpatient Hospital Stay (HOSPITAL_COMMUNITY): Payer: Medicare Other

## 2018-01-03 DIAGNOSIS — E119 Type 2 diabetes mellitus without complications: Secondary | ICD-10-CM | POA: Diagnosis not present

## 2018-01-03 DIAGNOSIS — R6521 Severe sepsis with septic shock: Secondary | ICD-10-CM | POA: Diagnosis present

## 2018-01-03 DIAGNOSIS — I69354 Hemiplegia and hemiparesis following cerebral infarction affecting left non-dominant side: Secondary | ICD-10-CM | POA: Diagnosis not present

## 2018-01-03 DIAGNOSIS — G894 Chronic pain syndrome: Secondary | ICD-10-CM | POA: Diagnosis present

## 2018-01-03 DIAGNOSIS — I1310 Hypertensive heart and chronic kidney disease without heart failure, with stage 1 through stage 4 chronic kidney disease, or unspecified chronic kidney disease: Secondary | ICD-10-CM | POA: Diagnosis present

## 2018-01-03 DIAGNOSIS — J189 Pneumonia, unspecified organism: Secondary | ICD-10-CM | POA: Diagnosis present

## 2018-01-03 DIAGNOSIS — J9601 Acute respiratory failure with hypoxia: Secondary | ICD-10-CM | POA: Diagnosis not present

## 2018-01-03 DIAGNOSIS — N170 Acute kidney failure with tubular necrosis: Secondary | ICD-10-CM | POA: Diagnosis present

## 2018-01-03 DIAGNOSIS — D869 Sarcoidosis, unspecified: Secondary | ICD-10-CM | POA: Diagnosis present

## 2018-01-03 DIAGNOSIS — E860 Dehydration: Secondary | ICD-10-CM | POA: Diagnosis present

## 2018-01-03 DIAGNOSIS — A419 Sepsis, unspecified organism: Principal | ICD-10-CM | POA: Diagnosis present

## 2018-01-03 DIAGNOSIS — E872 Acidosis: Secondary | ICD-10-CM | POA: Diagnosis not present

## 2018-01-03 DIAGNOSIS — G929 Unspecified toxic encephalopathy: Secondary | ICD-10-CM | POA: Insufficient documentation

## 2018-01-03 DIAGNOSIS — G9340 Encephalopathy, unspecified: Secondary | ICD-10-CM

## 2018-01-03 DIAGNOSIS — N179 Acute kidney failure, unspecified: Secondary | ICD-10-CM | POA: Diagnosis not present

## 2018-01-03 DIAGNOSIS — Z515 Encounter for palliative care: Secondary | ICD-10-CM

## 2018-01-03 DIAGNOSIS — L03317 Cellulitis of buttock: Secondary | ICD-10-CM | POA: Diagnosis not present

## 2018-01-03 DIAGNOSIS — Z91018 Allergy to other foods: Secondary | ICD-10-CM

## 2018-01-03 DIAGNOSIS — L0390 Cellulitis, unspecified: Secondary | ICD-10-CM | POA: Diagnosis present

## 2018-01-03 DIAGNOSIS — E861 Hypovolemia: Secondary | ICD-10-CM | POA: Diagnosis present

## 2018-01-03 DIAGNOSIS — E785 Hyperlipidemia, unspecified: Secondary | ICD-10-CM | POA: Diagnosis present

## 2018-01-03 DIAGNOSIS — J3800 Paralysis of vocal cords and larynx, unspecified: Secondary | ICD-10-CM | POA: Diagnosis present

## 2018-01-03 DIAGNOSIS — Z993 Dependence on wheelchair: Secondary | ICD-10-CM | POA: Diagnosis not present

## 2018-01-03 DIAGNOSIS — Z6841 Body Mass Index (BMI) 40.0 and over, adult: Secondary | ICD-10-CM

## 2018-01-03 DIAGNOSIS — Z888 Allergy status to other drugs, medicaments and biological substances status: Secondary | ICD-10-CM

## 2018-01-03 DIAGNOSIS — J9811 Atelectasis: Secondary | ICD-10-CM | POA: Diagnosis present

## 2018-01-03 DIAGNOSIS — G92 Toxic encephalopathy: Secondary | ICD-10-CM | POA: Diagnosis present

## 2018-01-03 DIAGNOSIS — N182 Chronic kidney disease, stage 2 (mild): Secondary | ICD-10-CM | POA: Diagnosis present

## 2018-01-03 DIAGNOSIS — Z9049 Acquired absence of other specified parts of digestive tract: Secondary | ICD-10-CM

## 2018-01-03 DIAGNOSIS — L89153 Pressure ulcer of sacral region, stage 3: Secondary | ICD-10-CM | POA: Diagnosis present

## 2018-01-03 DIAGNOSIS — E1122 Type 2 diabetes mellitus with diabetic chronic kidney disease: Secondary | ICD-10-CM | POA: Diagnosis present

## 2018-01-03 DIAGNOSIS — E890 Postprocedural hypothyroidism: Secondary | ICD-10-CM | POA: Diagnosis present

## 2018-01-03 DIAGNOSIS — Z01818 Encounter for other preprocedural examination: Secondary | ICD-10-CM

## 2018-01-03 DIAGNOSIS — Z794 Long term (current) use of insulin: Secondary | ICD-10-CM

## 2018-01-03 DIAGNOSIS — E876 Hypokalemia: Secondary | ICD-10-CM | POA: Diagnosis not present

## 2018-01-03 DIAGNOSIS — R197 Diarrhea, unspecified: Secondary | ICD-10-CM | POA: Diagnosis not present

## 2018-01-03 DIAGNOSIS — R0902 Hypoxemia: Secondary | ICD-10-CM | POA: Insufficient documentation

## 2018-01-03 DIAGNOSIS — Z4659 Encounter for fitting and adjustment of other gastrointestinal appliance and device: Secondary | ICD-10-CM

## 2018-01-03 DIAGNOSIS — E1169 Type 2 diabetes mellitus with other specified complication: Secondary | ICD-10-CM

## 2018-01-03 DIAGNOSIS — T50905A Adverse effect of unspecified drugs, medicaments and biological substances, initial encounter: Secondary | ICD-10-CM | POA: Insufficient documentation

## 2018-01-03 HISTORY — DX: Disorder of thyroid, unspecified: E07.9

## 2018-01-03 HISTORY — DX: Type 2 diabetes mellitus without complications: E11.9

## 2018-01-03 LAB — BLOOD GAS, ARTERIAL
Acid-base deficit: 10 mmol/L — ABNORMAL HIGH (ref 0.0–2.0)
Acid-base deficit: 11.8 mmol/L — ABNORMAL HIGH (ref 0.0–2.0)
Bicarbonate: 16.5 mmol/L — ABNORMAL LOW (ref 20.0–28.0)
Bicarbonate: 13.9 mmol/L — ABNORMAL LOW (ref 20.0–28.0)
DRAWN BY: 365271
DRAWN BY: 518061
FIO2: 100
FIO2: 50
LHR: 15 {breaths}/min
LHR: 24 {breaths}/min
MECHVT: 480 mL
O2 Saturation: 98.4 %
O2 Saturation: 97.8 %
PATIENT TEMPERATURE: 100.8
PCO2 ART: 46.4 mmHg (ref 32.0–48.0)
PEEP/CPAP: 5 cmH2O
PEEP: 5 cmH2O
PO2 ART: 364 mmHg — AB (ref 83.0–108.0)
PO2 ART: 148 mmHg — AB (ref 83.0–108.0)
Patient temperature: 98.6
VT: 480 mL
pCO2 arterial: 32.2 mmHg (ref 32.0–48.0)
pH, Arterial: 7.186 — CL (ref 7.350–7.450)
pH, Arterial: 7.257 — ABNORMAL LOW (ref 7.350–7.450)

## 2018-01-03 LAB — TROPONIN I: TROPONIN I: 0.04 ng/mL — AB (ref <0.03)

## 2018-01-03 LAB — CBC WITH DIFFERENTIAL/PLATELET
BASOS ABS: 0 10*3/uL (ref 0.0–0.1)
Basophils Relative: 0 %
EOS PCT: 0 %
Eosinophils Absolute: 0 10*3/uL (ref 0.0–0.7)
HCT: 38.5 % (ref 36.0–46.0)
Hemoglobin: 11.9 g/dL — ABNORMAL LOW (ref 12.0–15.0)
Lymphocytes Relative: 14 %
Lymphs Abs: 2.3 10*3/uL (ref 0.7–4.0)
MCH: 27.7 pg (ref 26.0–34.0)
MCHC: 30.9 g/dL (ref 30.0–36.0)
MCV: 89.5 fL (ref 78.0–100.0)
MONO ABS: 1.9 10*3/uL — AB (ref 0.1–1.0)
Monocytes Relative: 12 %
Neutro Abs: 11.9 10*3/uL — ABNORMAL HIGH (ref 1.7–7.7)
Neutrophils Relative %: 74 %
PLATELETS: 278 10*3/uL (ref 150–400)
RBC: 4.3 MIL/uL (ref 3.87–5.11)
RDW: 13.1 % (ref 11.5–15.5)
WBC: 16.1 10*3/uL — AB (ref 4.0–10.5)

## 2018-01-03 LAB — CBC
HCT: 35.1 % — ABNORMAL LOW (ref 36.0–46.0)
Hemoglobin: 10.6 g/dL — ABNORMAL LOW (ref 12.0–15.0)
MCH: 27.5 pg (ref 26.0–34.0)
MCHC: 30.2 g/dL (ref 30.0–36.0)
MCV: 91.2 fL (ref 78.0–100.0)
PLATELETS: 243 10*3/uL (ref 150–400)
RBC: 3.85 MIL/uL — ABNORMAL LOW (ref 3.87–5.11)
RDW: 13.2 % (ref 11.5–15.5)
WBC: 13.4 10*3/uL — ABNORMAL HIGH (ref 4.0–10.5)

## 2018-01-03 LAB — COMPREHENSIVE METABOLIC PANEL
ALT: 38 U/L (ref 0–44)
AST: 80 U/L — ABNORMAL HIGH (ref 15–41)
Albumin: 2.6 g/dL — ABNORMAL LOW (ref 3.5–5.0)
Alkaline Phosphatase: 78 U/L (ref 38–126)
Anion gap: 13 (ref 5–15)
BUN: 60 mg/dL — ABNORMAL HIGH (ref 8–23)
CHLORIDE: 98 mmol/L (ref 98–111)
CO2: 23 mmol/L (ref 22–32)
CREATININE: 4.73 mg/dL — AB (ref 0.44–1.00)
Calcium: 8.6 mg/dL — ABNORMAL LOW (ref 8.9–10.3)
GFR, EST AFRICAN AMERICAN: 10 mL/min — AB (ref >60)
GFR, EST NON AFRICAN AMERICAN: 9 mL/min — AB (ref >60)
Glucose, Bld: 222 mg/dL — ABNORMAL HIGH (ref 70–99)
POTASSIUM: 4.4 mmol/L (ref 3.5–5.1)
Sodium: 134 mmol/L — ABNORMAL LOW (ref 135–145)
Total Bilirubin: 0.7 mg/dL (ref 0.3–1.2)
Total Protein: 6.7 g/dL (ref 6.5–8.1)

## 2018-01-03 LAB — RENAL FUNCTION PANEL
ANION GAP: 12 (ref 5–15)
Albumin: 2.1 g/dL — ABNORMAL LOW (ref 3.5–5.0)
BUN: 59 mg/dL — ABNORMAL HIGH (ref 8–23)
CHLORIDE: 105 mmol/L (ref 98–111)
CO2: 20 mmol/L — ABNORMAL LOW (ref 22–32)
CREATININE: 3.99 mg/dL — AB (ref 0.44–1.00)
Calcium: 7.6 mg/dL — ABNORMAL LOW (ref 8.9–10.3)
GFR calc Af Amer: 13 mL/min — ABNORMAL LOW (ref >60)
GFR, EST NON AFRICAN AMERICAN: 11 mL/min — AB (ref >60)
Glucose, Bld: 245 mg/dL — ABNORMAL HIGH (ref 70–99)
POTASSIUM: 4.2 mmol/L (ref 3.5–5.1)
Phosphorus: 4 mg/dL (ref 2.5–4.6)
Sodium: 137 mmol/L (ref 135–145)

## 2018-01-03 LAB — HEMOGLOBIN A1C
Hgb A1c MFr Bld: 9 % — ABNORMAL HIGH (ref 4.8–5.6)
Mean Plasma Glucose: 211.6 mg/dL

## 2018-01-03 LAB — URINALYSIS, ROUTINE W REFLEX MICROSCOPIC
Bacteria, UA: NONE SEEN
GLUCOSE, UA: 50 mg/dL — AB
KETONES UR: NEGATIVE mg/dL
LEUKOCYTES UA: NEGATIVE
NITRITE: NEGATIVE
PH: 5 (ref 5.0–8.0)
PROTEIN: NEGATIVE mg/dL
Specific Gravity, Urine: 1.019 (ref 1.005–1.030)

## 2018-01-03 LAB — I-STAT CG4 LACTIC ACID, ED
LACTIC ACID, VENOUS: 1.75 mmol/L (ref 0.5–1.9)
Lactic Acid, Venous: 1.95 mmol/L — ABNORMAL HIGH (ref 0.5–1.9)

## 2018-01-03 LAB — GLUCOSE, CAPILLARY
GLUCOSE-CAPILLARY: 218 mg/dL — AB (ref 70–99)
GLUCOSE-CAPILLARY: 231 mg/dL — AB (ref 70–99)
GLUCOSE-CAPILLARY: 229 mg/dL — AB (ref 70–99)
GLUCOSE-CAPILLARY: 376 mg/dL — AB (ref 70–99)
Glucose-Capillary: 325 mg/dL — ABNORMAL HIGH (ref 70–99)

## 2018-01-03 LAB — RAPID URINE DRUG SCREEN, HOSP PERFORMED
AMPHETAMINES: NOT DETECTED
Benzodiazepines: NOT DETECTED
Cocaine: NOT DETECTED
Opiates: POSITIVE — AB
Tetrahydrocannabinol: NOT DETECTED

## 2018-01-03 LAB — PROCALCITONIN: Procalcitonin: 4.46 ng/mL

## 2018-01-03 LAB — MAGNESIUM: Magnesium: 1.8 mg/dL (ref 1.7–2.4)

## 2018-01-03 LAB — MRSA PCR SCREENING: MRSA BY PCR: POSITIVE — AB

## 2018-01-03 LAB — CBG MONITORING, ED: GLUCOSE-CAPILLARY: 261 mg/dL — AB (ref 70–99)

## 2018-01-03 LAB — TSH: TSH: 0.303 u[IU]/mL — ABNORMAL LOW (ref 0.350–4.500)

## 2018-01-03 MED ORDER — PIPERACILLIN-TAZOBACTAM 3.375 G IVPB
3.3750 g | Freq: Once | INTRAVENOUS | Status: AC
  Administered 2018-01-03: 3.375 g via INTRAVENOUS
  Filled 2018-01-03: qty 50

## 2018-01-03 MED ORDER — PHENYLEPHRINE HCL-NACL 40-0.9 MG/250ML-% IV SOLN
0.0000 ug/min | INTRAVENOUS | Status: DC
Start: 2018-01-03 — End: 2018-01-04
  Administered 2018-01-03: 200 ug/min via INTRAVENOUS
  Administered 2018-01-03: 360 ug/min via INTRAVENOUS
  Administered 2018-01-03: 400 ug/min via INTRAVENOUS
  Administered 2018-01-03: 350 ug/min via INTRAVENOUS
  Administered 2018-01-03 (×2): 320 ug/min via INTRAVENOUS
  Filled 2018-01-03 (×9): qty 250

## 2018-01-03 MED ORDER — SODIUM CHLORIDE 0.9 % IV BOLUS
2000.0000 mL | Freq: Once | INTRAVENOUS | Status: AC
  Administered 2018-01-03: 2000 mL via INTRAVENOUS

## 2018-01-03 MED ORDER — NALOXONE HCL 0.4 MG/ML IJ SOLN
INTRAMUSCULAR | Status: AC
  Administered 2018-01-03: 0.2 mg via INTRAVENOUS
  Filled 2018-01-03: qty 1

## 2018-01-03 MED ORDER — ROCURONIUM BROMIDE 50 MG/5ML IV SOLN
1.0000 mg/kg | Freq: Once | INTRAVENOUS | Status: AC
  Administered 2018-01-03: 50 mg via INTRAVENOUS
  Filled 2018-01-03: qty 11.29

## 2018-01-03 MED ORDER — INSULIN REGULAR HUMAN 100 UNIT/ML IJ SOLN
1.0000 [IU] | INTRAMUSCULAR | Status: DC
Start: 2018-01-03 — End: 2018-01-03
  Administered 2018-01-03 (×3): 3 [IU] via SUBCUTANEOUS
  Filled 2018-01-03 (×53): qty 0.03

## 2018-01-03 MED ORDER — FAMOTIDINE IN NACL 20-0.9 MG/50ML-% IV SOLN
20.0000 mg | INTRAVENOUS | Status: DC
  Administered 2018-01-03 – 2018-01-04 (×2): 20 mg via INTRAVENOUS
  Filled 2018-01-03 (×2): qty 50

## 2018-01-03 MED ORDER — PHENYLEPHRINE HCL-NACL 10-0.9 MG/250ML-% IV SOLN
0.0000 ug/min | INTRAVENOUS | Status: DC
  Administered 2018-01-03: 400 ug/min via INTRAVENOUS
  Filled 2018-01-03 (×7): qty 250

## 2018-01-03 MED ORDER — SODIUM CHLORIDE 0.9 % IV SOLN
Freq: Once | INTRAVENOUS | Status: AC
  Administered 2018-01-03: 12:00:00 via INTRAVENOUS

## 2018-01-03 MED ORDER — CHLORHEXIDINE GLUCONATE 0.12 % MT SOLN
15.0000 mL | Freq: Two times a day (BID) | OROMUCOSAL | Status: DC
  Administered 2018-01-03: 15 mL via OROMUCOSAL

## 2018-01-03 MED ORDER — SODIUM CHLORIDE 0.9 % IV SOLN: Freq: Once | INTRAVENOUS | Status: DC

## 2018-01-03 MED ORDER — VANCOMYCIN HCL IN DEXTROSE 1-5 GM/200ML-% IV SOLN
1000.0000 mg | Freq: Once | INTRAVENOUS | Status: AC
  Administered 2018-01-03: 1000 mg via INTRAVENOUS
  Filled 2018-01-03: qty 200

## 2018-01-03 MED ORDER — SODIUM CHLORIDE 0.9 % IV SOLN
Freq: Once | INTRAVENOUS | Status: AC
  Administered 2018-01-03: 15:00:00 via INTRAVENOUS

## 2018-01-03 MED ORDER — ACETAMINOPHEN 650 MG RE SUPP
650.0000 mg | Freq: Four times a day (QID) | RECTAL | Status: DC | PRN
Start: 2018-01-03 — End: 2018-01-10
  Administered 2018-01-03 – 2018-01-04 (×2): 650 mg via RECTAL
  Filled 2018-01-03: qty 1

## 2018-01-03 MED ORDER — MUPIROCIN 2 % EX OINT
1.0000 "application " | TOPICAL_OINTMENT | Freq: Two times a day (BID) | CUTANEOUS | Status: AC
  Administered 2018-01-03 – 2018-01-07 (×10): 1 via NASAL
  Filled 2018-01-03 (×3): qty 22

## 2018-01-03 MED ORDER — MIDAZOLAM HCL 2 MG/2ML IJ SOLN
INTRAMUSCULAR | Status: AC
  Administered 2018-01-03: 2 mg
  Filled 2018-01-03: qty 2

## 2018-01-03 MED ORDER — SODIUM CHLORIDE 0.9 % IV SOLN
250.0000 mL | INTRAVENOUS | Status: DC | PRN
  Administered 2018-01-03: 250 mL via INTRAVENOUS

## 2018-01-03 MED ORDER — VASOPRESSIN 20 UNIT/ML IV SOLN
0.0300 [IU]/min | INTRAVENOUS | Status: DC
  Administered 2018-01-03: 0.03 [IU]/min via INTRAVENOUS
  Filled 2018-01-03: qty 2

## 2018-01-03 MED ORDER — ASPIRIN 81 MG PO CHEW: 324.0000 mg | CHEWABLE_TABLET | ORAL | Status: DC

## 2018-01-03 MED ORDER — SODIUM CHLORIDE 0.9 % IV BOLUS
1000.0000 mL | Freq: Once | INTRAVENOUS | Status: AC
  Administered 2018-01-03: 1000 mL via INTRAVENOUS

## 2018-01-03 MED ORDER — HEPARIN SODIUM (PORCINE) 5000 UNIT/ML IJ SOLN
5000.0000 [IU] | Freq: Three times a day (TID) | INTRAMUSCULAR | Status: DC
  Administered 2018-01-03 – 2018-01-10 (×23): 5000 [IU] via SUBCUTANEOUS
  Filled 2018-01-03 (×23): qty 1

## 2018-01-03 MED ORDER — LABETALOL HCL 5 MG/ML IV SOLN: 20.0000 mg | INTRAVENOUS | Status: DC | PRN

## 2018-01-03 MED ORDER — SODIUM BICARBONATE 8.4 % IV SOLN
INTRAVENOUS | Status: DC
  Administered 2018-01-03 – 2018-01-04 (×3): via INTRAVENOUS
  Filled 2018-01-03 (×4): qty 150

## 2018-01-03 MED ORDER — FENTANYL CITRATE (PF) 100 MCG/2ML IJ SOLN
50.0000 ug | INTRAMUSCULAR | Status: DC | PRN
  Administered 2018-01-04 (×2): 100 ug via INTRAVENOUS
  Filled 2018-01-03 (×3): qty 2

## 2018-01-03 MED ORDER — ACETAMINOPHEN 160 MG/5ML PO SOLN
650.0000 mg | Freq: Four times a day (QID) | ORAL | Status: DC | PRN
  Administered 2018-01-03 – 2018-01-06 (×4): 650 mg
  Filled 2018-01-03 (×4): qty 20.3

## 2018-01-03 MED ORDER — ALBUTEROL SULFATE (2.5 MG/3ML) 0.083% IN NEBU: 2.5000 mg | INHALATION_SOLUTION | Freq: Four times a day (QID) | RESPIRATORY_TRACT | Status: DC | PRN

## 2018-01-03 MED ORDER — PHENYLEPHRINE HCL 10 MG/ML IJ SOLN
0.0000 ug/min | INTRAMUSCULAR | Status: DC
  Administered 2018-01-03: 300 ug/min via INTRAVENOUS
  Filled 2018-01-03: qty 1

## 2018-01-03 MED ORDER — PIPERACILLIN-TAZOBACTAM IN DEX 2-0.25 GM/50ML IV SOLN
2.2500 g | Freq: Four times a day (QID) | INTRAVENOUS | Status: DC
  Administered 2018-01-03 – 2018-01-04 (×4): 2.25 g via INTRAVENOUS
  Filled 2018-01-03 (×6): qty 50

## 2018-01-03 MED ORDER — FENTANYL CITRATE (PF) 100 MCG/2ML IJ SOLN
INTRAMUSCULAR | Status: AC
  Administered 2018-01-03: 100 ug
  Filled 2018-01-03: qty 2

## 2018-01-03 MED ORDER — ACETAMINOPHEN 650 MG RE SUPP
RECTAL | Status: AC
  Filled 2018-01-03: qty 1

## 2018-01-03 MED ORDER — INSULIN REGULAR HUMAN 100 UNIT/ML IJ SOLN
2.0000 [IU] | INTRAMUSCULAR | Status: DC
  Administered 2018-01-03: 11 [IU] via SUBCUTANEOUS
  Administered 2018-01-03 – 2018-01-04 (×3): 15 [IU] via SUBCUTANEOUS
  Administered 2018-01-04: 8 [IU] via SUBCUTANEOUS
  Administered 2018-01-04: 15 [IU] via SUBCUTANEOUS
  Filled 2018-01-03 (×50): qty 0.15

## 2018-01-03 MED ORDER — ORAL CARE MOUTH RINSE
15.0000 mL | OROMUCOSAL | Status: DC
  Administered 2018-01-03 – 2018-01-04 (×9): 15 mL via OROMUCOSAL

## 2018-01-03 MED ORDER — ETOMIDATE 2 MG/ML IV SOLN
0.3000 mg/kg | Freq: Once | INTRAVENOUS | Status: AC
  Administered 2018-01-03: 20 mg via INTRAVENOUS

## 2018-01-03 MED ORDER — ORAL CARE MOUTH RINSE: 15.0000 mL | Freq: Two times a day (BID) | OROMUCOSAL | Status: DC

## 2018-01-03 MED ORDER — NALOXONE HCL 0.4 MG/ML IJ SOLN: 0.2000 mg | INTRAMUSCULAR | Status: DC | PRN

## 2018-01-03 MED ORDER — CHLORHEXIDINE GLUCONATE 0.12% ORAL RINSE (MEDLINE KIT)
15.0000 mL | Freq: Two times a day (BID) | OROMUCOSAL | Status: DC
  Administered 2018-01-03 – 2018-01-04 (×2): 15 mL via OROMUCOSAL

## 2018-01-03 MED ORDER — CHLORHEXIDINE GLUCONATE CLOTH 2 % EX PADS
6.0000 | MEDICATED_PAD | Freq: Every day | CUTANEOUS | Status: AC
  Administered 2018-01-03 – 2018-01-08 (×5): 6 via TOPICAL

## 2018-01-03 MED ORDER — INSULIN ASPART 100 UNIT/ML ~~LOC~~ SOLN: 1.0000 [IU] | SUBCUTANEOUS | Status: DC

## 2018-01-03 MED ORDER — SODIUM CHLORIDE 0.9 % IV SOLN
INTRAVENOUS | Status: DC
  Administered 2018-01-03 – 2018-01-10 (×8): via INTRAVENOUS

## 2018-01-03 MED ORDER — ASPIRIN 300 MG RE SUPP
300.0000 mg | RECTAL | Status: DC
  Filled 2018-01-03: qty 1

## 2018-01-03 MED ORDER — NALOXONE HCL 0.4 MG/ML IJ SOLN
0.2000 mg | Freq: Once | INTRAMUSCULAR | Status: AC
  Administered 2018-01-03: 0.2 mg via INTRAVENOUS

## 2018-01-03 NOTE — ED Notes (Signed)
Family very upset with this RN because I will not let them back to see pt. Pt initially registered as a "Doe" and husband wants to know why I would not let him back to "confirm her identity" Explained to pt husband I have no way of confirming whether it is her or not so I cant just let him go into this room to be the one to confirm it. Also attempted to explain that pt is very critical at this time and per request of EDP, primary RN and charge RN family must wait until going back. Pt husband and daughter very argumentative and rude with this RN about not letting them back.

## 2018-01-03 NOTE — Progress Notes (Signed)
Pharmacy Antibiotic Note  Natalie Macdonald is a 62 y.o. female admitted on 01/03/2018 with sepsis.  Pharmacy has been consulted for vancomycin and zosyn dosing. WBC 16.1>13.4. Tm 102.65F. LA 1.95, PCT 4.46. SCr 3.99 (BL ~ unknown). CrCl ~ 10-15 mL/min. Patient has received a dose of vancomycin 1 gm IV once and Zosyn 3.375 gm IV once.   Plan: -Will give an additional dose of vancomycin 1 gm IV once for an adequate load, then dose per levels  -Zosyn 2.25 gm IV Q 6 hours  -Monitor CBC, renal fx, cultures and clinical progress   Weight: 249 lb (112.9 kg)  Temp (24hrs), Avg:101.4 F (38.6 C), Min:100 F (37.8 C), Max:102.9 F (39.4 C)  Recent Labs  Lab 01/03/18 0104 01/03/18 0126 01/03/18 0401 01/03/18 0615  WBC  --  16.1*  --  13.4*  CREATININE  --  4.73*  --  3.99*  LATICACIDVEN 1.75  --  1.95*  --     CrCl cannot be calculated (Unknown ideal weight.).    Allergies  Allergen Reactions  . Grapefruit Flavor [Flavoring Agent]   . Procaine     Antimicrobials this admission: Zosyn 7/4 >>  Vancomycin 7/4 >>   Dose adjustments this admission: None   Microbiology results: 7/4 BCx:  7/4 UCx:   7/4 MRSA PCR: positive   Thank you for allowing pharmacy to be a part of this patient's care.  Albertina Parr, PharmD., BCPS Clinical Pharmacist Clinical phone for 01/03/18 until 3:30pm: [phone] If after 3:30pm, please refer to [facility] for unit-specific pharmacist

## 2018-01-03 NOTE — ED Notes (Signed)
CCM at bedside 

## 2018-01-03 NOTE — Procedures (Signed)
Arterial Catheter Insertion Procedure Note Natalie Macdonald [id number] 07/04/1955  Procedure: Insertion of Arterial Catheter  Indications: Blood pressure monitoring and Frequent blood sampling  Procedure Details Consent: Risks of procedure as well as the alternatives and risks of each were explained to the (patient/caregiver).  Consent for procedure obtained. Time Out: Verified patient identification, verified procedure, site/side was marked, verified correct patient position, special equipment/implants available, medications/allergies/relevent history reviewed, required imaging and test results available.  Performed  Maximum sterile technique was used including antiseptics, cap, gloves, gown, hand hygiene, mask and sheet. Skin prep: Chlorhexidine; local anesthetic administered 20 gauge catheter was inserted into right radial artery using the Seldinger technique. ULTRASOUND GUIDANCE USED: YES Evaluation Blood flow good; BP tracing good. Complications: No apparent complications.   Natalie Macdonald 01/03/2018

## 2018-01-03 NOTE — Progress Notes (Signed)
PT transported on vent by RT to CT and back to room 4D73 with no complications.

## 2018-01-03 NOTE — Progress Notes (Signed)
Critical ABG results reported to April Lewis, RN.

## 2018-01-03 NOTE — Progress Notes (Signed)
Called CCM in regards to right pupil non-reactive, responds to sternal rub, currently on nasal cannula with sats in 90s. CCM aware and will reassess. Modena Morrow E, RN 01/03/2018 8:56 AM

## 2018-01-03 NOTE — ED Notes (Signed)
CCM paged about pt family refusing pt to have Novolog d/t "allergy"

## 2018-01-03 NOTE — Procedures (Signed)
Intubation Procedure Note Natalie Macdonald [id number] 1955-12-02  Procedure: Intubation Indications: Airway protection and maintenance  Procedure Details Consent: Risks of procedure as well as the alternatives and risks of each were explained to the (patient/caregiver).  Consent for procedure obtained. Time Out: Verified patient identification, verified procedure, site/side was marked, verified correct patient position, special equipment/implants available, medications/allergies/relevent history reviewed, required imaging and test results available.  Performed  Maximum sterile technique was used including gloves, hand hygiene and mask.  MAC    Evaluation Hemodynamic Status: BP stable throughout; O2 sats: stable throughout Patient's Current Condition: stable Complications: No apparent complications Patient did tolerate procedure well. Chest X-ray ordered to verify placement.  CXR: pending.   Natalie Macdonald 01/03/2018

## 2018-01-03 NOTE — ED Triage Notes (Signed)
Pt arrived by EMS from home for unresponsivness. Per family who is also the caregiver, pt is bedbound at baseline with speech difficulty since have a cervical surgery in April 2018. Pt has been sleeping more than normal and last seen normal was >24 hours ago. EMS bagging pt on arrival, Initial bp 84/60, tachycardic at 150s, temporal temp of 103, and sats at 75%. Pt noted to have involuntary twitching of upper arms, with facial grimaces at times

## 2018-01-03 NOTE — H&P (Signed)
PULMONARY / CRITICAL CARE MEDICINE   Name: Natalie Macdonald MRN: [id number] DOB: [birth year]    ADMISSION DATE:  01/03/2018 CONSULTATION DATE:  01/03/2018  REFERRING MD:  Dr. Stark Jock  CHIEF COMPLAINT:  Sepsis  HISTORY OF PRESENT ILLNESS:   HPI obtained from medical chart review, see separate MRN [id number], and per daughter and husband at bedside as patient has acute encephalopathy.   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   Patient lives at home and is cared for by her daughter.  Since her cervical surgery in 2018, patient has been mostly wheelchair bound.  At times she is able to verbalize with hoarseness at others with mouthing words and is normally alert and oriented. She eats a regular diet and daughter reports she just had her yearly exam last week.  She was placed on an unknown antibiotic for buttock wound which family reports is ongoing reoccurring issue.   Daughter reports patient was in her normal state of health on 6/1, however the next day did not eat or drink as much.  On 6/3, patient has been somnolent with intermittent episodes of consciousness.    In ER, she was found febrile at 102.7, tachycardic, hypotensive, and hypoxic requiring nasal cannula.  She has been intermittent responsive.  Labs significant for glucose 222, BUN 60, sCr 4.73, albumin 2.6, AST 80, WBC 16.1 with mild left shift, lactic 1.75.  CT head negative, CXR shallow but possible RUL inflitrate.  UA pending.  Patient treated with NS 500, vancomycin and zosyn after blood cultures.  Remains hypotensive, currently receiving the remainder of her 30 ml/kg at this time. PCCM called for admission.   PAST MEDICAL HISTORY :  She  has a past medical history of Diabetes mellitus without complication ([place]), History of blood transfusion, Hypertension,  Hyponatremia, hypothyroid, Sarcoidosis, Sepsis ([place]), and Uterine fibroid.  PAST SURGICAL HISTORY: She  has a past surgical history that includes Cholecystectomy; Anterior cervical decomp/discectomy fusion; and Thyroidectomy.  Allergies  Allergen Reactions  . Grapefruit Flavor [Flavoring Agent]   . Procaine     No current facility-administered medications on file prior to encounter.    No current outpatient medications on file prior to encounter.    FAMILY HISTORY:  Her has no family status information on file.    SOCIAL HISTORY:  REVIEW OF SYSTEMS:   Unable to assess from patient as she is acutely encephalopathic   SUBJECTIVE:  VITAL SIGNS: BP (!) 92/50   Pulse (!) 136   Temp (!) 102.7 F (39.3 C)   Resp 20   Wt 249 lb (112.9 kg)   SpO2 94%   HEMODYNAMICS:    VENTILATOR SETTINGS:    INTAKE / OUTPUT: No intake/output data recorded.  PHYSICAL EXAMINATION: General:  Chronically ill appearing, well nourished,adult female lying in bed in NAD HEENT: MM pink/dry, pupils 3/ reactive, left facial droop Neuro:  Eyes open occasionally, not following commands, occasionally moans CV: ST, no m/r/g PULM: even/non-labored, slight rhonchi in R anterior, otherwise clear, diminished bases GI: obese, soft, non-tender, bs active  Extremities: warm/dry, atrophy, trace BLE edema Skin: no rashes, see picture for buttock wound      LABS:  BMET Recent Labs  Lab 01/03/18 0126  NA 134*  K 4.4  CL 98  CO2 23  BUN 60*  CREATININE 4.73*  GLUCOSE 222*  Electrolytes Recent Labs  Lab 01/03/18 0126  CALCIUM 8.6*    CBC Recent Labs  Lab 01/03/18 0126  WBC 16.1*  HGB 11.9*  HCT 38.5  PLT 278    Coag's No results for input(s): APTT, INR in the last 168 hours.  Sepsis Markers Recent Labs  Lab 01/03/18 0104  LATICACIDVEN 1.75    ABG No results for input(s): PHART, PCO2ART, PO2ART in the last 168 hours.  Liver Enzymes Recent Labs  Lab 01/03/18 0126   AST 80*  ALT 38  ALKPHOS 78  BILITOT 0.7  ALBUMIN 2.6*    Cardiac Enzymes No results for input(s): TROPONINI, PROBNP in the last 168 hours.  Glucose No results for input(s): GLUCAP in the last 168 hours.  Imaging Ct Head Wo Contrast  Result Date: 01/03/2018 CLINICAL DATA:  Unresponsiveness. Last seen normal greater than 24 hours ago. EXAM: CT HEAD WITHOUT CONTRAST TECHNIQUE: Contiguous axial images were obtained from the base of the skull through the vertex without intravenous contrast. COMPARISON:  None. FINDINGS: Brain: Mild cerebral atrophy. No evidence of acute infarction, hemorrhage, hydrocephalus, extra-axial collection or mass lesion/mass effect. Vascular: Intracranial arterial vascular calcifications are present. Skull: Normal. Negative for fracture or focal lesion. Sinuses/Orbits: No acute finding. Other: None. IMPRESSION: No acute intracranial abnormalities.  Mild cerebral atrophy. Electronically Signed   By: Lucienne Capers M.D.   On: 01/03/2018 01:57   Dg Chest Portable 1 View  Result Date: 01/03/2018 CLINICAL DATA:  Patient is unresponsive. EXAM: PORTABLE CHEST 1 VIEW COMPARISON:  None. FINDINGS: Shallow inspiration. Normal heart size and pulmonary vascularity. No focal airspace disease or consolidation in the lungs. No blunting of costophrenic angles. No pneumothorax. Mediastinal contours appear intact. Prominent degenerative changes in the shoulders. IMPRESSION: Shallow inspiration.  No evidence of active pulmonary disease. Electronically Signed   By: Lucienne Capers M.D.   On: 01/03/2018 01:17   STUDIES:  7/4 CT head >> No acute intracranial abnormalities.  Mild cerebral atrophy.  CULTURES: 7/4 BC x 2>> 7/4 UC >>  ANTIBIOTICS: 7/4 vancomycin >> 7/4 zosyn >>  SIGNIFICANT EVENTS: 7/4 admit  LINES/TUBES: PIV 22g x 2 >> 7/4 foley >>  DISCUSSION: 22 yoF presenting from home with decreased PO intake since 7/2 with fever and altered mental status x 1 day.   Presented with SIRS, currently fluid responsive.    ASSESSMENT / PLAN:  PULMONARY A: Hypoxia  R/o RUL infiltrate/ concern for aspiration  Hx of vocal cord paralysis P:   Supplemental O2 prn  CXR in am  See ID pulm hygiene as able  BD prn   CARDIOVASCULAR A:  SIRS Prolonged QTc  Hx HTN, HLD P:  Tele monitoring  Repeat EKG, check troponin  Continue remainder of 30 ml/kg fluid bolus for goal MAP > 65 Lactic normal Family at this time is refusing central line for poor access. Will need to clarify home meds   RENAL A:   AKI  P:   Finishing 30 ml/kg fluid bolus then IVF at 125 ml/hr Add on mag, check ionized Ca Insert foley Trend BMP / mg/ phos/ daily wt / urinary output Replace electrolytes as indicated  GASTROINTESTINAL A:   No acute issues  GERD - LFTs wnl P:   NPO Pepcid 20 mg IV daily   HEMATOLOGIC A:   Normocytic anemia P:  Trend CBC SCDs and heparin SQ  INFECTIOUS A:   Leukocytosis  Gluteal wound  P:   Awaiting UA Follow UC/ BC Assess PCT  Trend WBC/  fever curve Continue vanc for skin coverage/ zosyn for now possible aspiration vs UTI, deescalate as able  Tylenol prn fever  Wound care consult   ENDOCRINE A:   DM Hypothyroidism s/p thyroidectomy  P:   CBG q 4 SSI - family ok with regular insulin- reports prior intolerance/allergy to novolog, on home aprida Holding home lantus for now Assess TSH / HgbA1c  Will need to clarify home thyroid med/ dosage  NEUROLOGIC A:   Acute encephalotomy - likely r/t toxic/ metabolic given infection and possible retention of opiates given AKI Hx Chronic left hemiparesis, ACDF (10/2016), chronic pain- fibromyalgia, arthritis  P:   Frequent neuro checks Consider narcan  Hold home narcotics  Assess UDS  FAMILY  - Updates: Daughter and husband updated on plan of care at bedside.   - Inter-disciplinary family meet or Palliative Care meeting due by: 7/11   Kennieth Rad, AGACNP-BC [place]  Pulmonary & Critical Care Pgr: [phone] or if no answer [phone] 01/03/2018, 3:55 AM

## 2018-01-03 NOTE — Procedures (Signed)
Central Venous Catheter Insertion Procedure Note Anasia Agro [id number] 08-19-1955  Procedure: Insertion of Central Venous Catheter Indications: Assessment of intravascular volume, Drug and/or fluid administration and Frequent blood sampling  Procedure Details Consent: Risks of procedure as well as the alternatives and risks of each were explained to the (patient/caregiver).  Consent for procedure obtained. Time Out: Verified patient identification, verified procedure, site/side was marked, verified correct patient position, special equipment/implants available, medications/allergies/relevent history reviewed, required imaging and test results available.  Performed  Maximum sterile technique was used including antiseptics, cap, gloves, gown, hand hygiene, mask and sheet. Skin prep: Chlorhexidine; local anesthetic administered A antimicrobial bonded/coated triple lumen catheter was placed in the left subclavian vein using the Seldinger technique.  Evaluation Blood flow good Complications: No apparent complications Patient did tolerate procedure well. Chest X-ray ordered to verify placement.  CXR: pending.  Carly Applegate 01/03/2018, 10:52 AM

## 2018-01-03 NOTE — ED Notes (Signed)
Per daughter, pt's primary caregiver, pt has had facial with bilateral arm twitching since have an experimental surgery in April 2018. Also reports pt is able to speak and have normal conversations but has "paralysis of one side of vocal cords." On good days, pt is able to ambulate with a walker with daughters assistance. Pt remains unresponsive, able to maintain her own airway at present

## 2018-01-03 NOTE — Progress Notes (Signed)
Called by bedside RN, patient is obtunded at this point and becoming hypotensive.  I spoke with daughter, she is understandably very upset and does not understand why her mother is deteriorating.  I spoke with her, she wishes for full code status.  I obtained consent for a central line.  Proceeded with intubation, give IVF and started pressors.  PCCM will continue to follow.  The patient is critically ill with multiple organ systems failure and requires high complexity decision making for assessment and support, frequent evaluation and titration of therapies, application of advanced monitoring technologies and extensive interpretation of multiple databases.   Critical Care Time devoted to patient care services described in this note is  35  Minutes. This time reflects time of care of this signee Dr [name]. This critical care time does not reflect procedure time, or teaching time or supervisory time of PA/NP/Med student/Med Resident etc but could involve care discussion time.  Rush Farmer, M.D. [facility] Pulmonary/Critical Care Medicine. Pager: [phone]. After hours pager: [phone].

## 2018-01-03 NOTE — Procedures (Signed)
OGT Placement By MD  OGT placed under direct laryngoscopy and visualized.  Rush Farmer, M.D. [facility] Pulmonary/Critical Care Medicine. Pager: [phone]. After hours pager: [phone].

## 2018-01-03 NOTE — Progress Notes (Addendum)
Patient admitted overnight for sepsis and AMS.  On exam, she remains unresponsive but protecting her airway.  Family refused central line placement overnight but no family present bedside.  On exam, she is unresponsive but clearly protecting her airway, unsure what is the baseline.  I reviewed CXR myself, atelectasis noted.  Discussed with PCCM-NP.  Will hold off intubation today.  D/C pressors as BP is improved.  Monitor clinically.  Family refused central line placement yesterday, I spoke with daughter over the phone, she continued to refuse the central line but wants her mother full code.  After being informed that it is either a central line or patient will continue to be hypotensive and likely expire she was agreeable reluctantly and is coming in for more discussions.  Full code status for now.  The patient is critically ill with multiple organ systems failure and requires high complexity decision making for assessment and support, frequent evaluation and titration of therapies, application of advanced monitoring technologies and extensive interpretation of multiple databases.   Critical Care Time devoted to patient care services described in this note is  35  Minutes. This time reflects time of care of this signee Dr [name]. This critical care time does not reflect procedure time, or teaching time or supervisory time of PA/NP/Med student/Med Resident etc but could involve care discussion time.  Rush Farmer, M.D. [facility] Pulmonary/Critical Care Medicine. Pager: [phone]. After hours pager: [phone].

## 2018-01-03 NOTE — Consult Note (Signed)
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 Etiology is pressure and moisture from incontinence Wound type:stage 3 pressure injury Pressure Injury POA: Yes Measurement: Left gluteal:  6.4 cm x 6 cmx 0.3 cm  Right gluteal:  4 cm x 5 cm x 0.2 cm Sacrum:  1 cm x 1 cm x 0.2 cm  The wounds are all connected into one large nonintact lesion Wound VVL:RTJWW red and friable Drainage (amount, consistency, odor) moderate serosanguinous   Periwound:Intact Dressing procedure/placement/frequency: Cleanse buttocks wound with NS and pat dry.  Cover nonintact skin with Aquacel Ag. Will require two sheets.  Secure with ABD pad and tape.  Change daily. Will not follow at this time.  Please re-consult if needed.  Domenic Moras RN BSN [place] Pager [phone]

## 2018-01-03 NOTE — ED Provider Notes (Signed)
[facility] EMERGENCY DEPARTMENT Provider Note   CSN: [id number] Arrival date & time: 01/03/18  0039     History   Chief Complaint Chief Complaint  Patient presents with  . Altered Mental Status    HPI Natalie Macdonald is a 62 y.o. female.  Patient is a 62 year old female with past medical history of diabetes, cervical surgery with complications.  According to EMS, she is nonverbal and nonambulatory.  This evening, she was found by family members to be less responsive than normal.  This has been worsening over the past 24 hours.  EMS found the patient to be hypotensive, tachycardic, and febrile.  She initially had low oxygen saturations and as her level of consciousness was decreased, they initiated ventilation with bag valve mask.     No past medical history on file.  There are no active problems to display for this patient.         Home Medications    Prior to Admission medications   Not on File    Family History No family history on file.  Social History Social History   Tobacco Use  . Smoking status: Not on file  Substance Use Topics  . Alcohol use: Not on file  . Drug use: Not on file     Allergies   Patient has no allergy information on record.   Review of Systems Review of Systems  Unable to perform ROS: Acuity of condition     Physical Exam Updated Vital Signs BP (!) 133/119   Pulse (!) 151   Temp (!) 102.7 F (39.3 C)   Resp (!) 22   SpO2 100%   Physical Exam  Constitutional: No distress.  Patient is a chronically ill-appearing 62 year old female.  She does not respond to voice or follow commands.  HENT:  Head: Normocephalic and atraumatic.  Eyes: Pupils are equal, round, and reactive to light.  Neck: Normal range of motion. Neck supple.  Cardiovascular: Regular rhythm. Exam reveals no gallop and no friction rub.  No murmur heard. Heart is tachycardic, but regular.  Pulmonary/Chest: Effort normal and breath  sounds normal. No respiratory distress. He has no wheezes.  Abdominal: Soft. Bowel sounds are normal. He exhibits no distension. There is no tenderness.  Musculoskeletal: Normal range of motion.  Neurological:  Patient is with decreased level of consciousness.  She does withdrawal from noxious stimuli and opens eyes to loud voice.  She does not follow commands.  Skin: Skin is warm and dry. He is not diaphoretic.  Nursing note and vitals reviewed.    ED Treatments / Results  Labs (all labs ordered are listed, but only abnormal results are displayed) Labs Reviewed  CULTURE, BLOOD (ROUTINE X 2)  CULTURE, BLOOD (ROUTINE X 2)  URINE CULTURE  COMPREHENSIVE METABOLIC PANEL  CBC WITH DIFFERENTIAL/PLATELET  URINALYSIS, ROUTINE W REFLEX MICROSCOPIC  I-STAT CG4 LACTIC ACID, ED    EKG None  Radiology No results found.  Procedures Procedures (including critical care time)  Medications Ordered in ED Medications  vancomycin (VANCOCIN) IVPB 1000 mg/200 mL premix (has no administration in time range)  piperacillin-tazobactam (ZOSYN) IVPB 3.375 g (has no administration in time range)     Initial Impression / Assessment and Plan / ED Course  I have reviewed the triage vital signs and the nursing notes.  Pertinent labs & imaging results that were available during my care of the patient were reviewed by me and considered in my medical decision making (see chart for  details).  Patient with history of diabetes and prior neck surgery resulting in spinal cord impairment and vocal cord paralysis.  She was brought by EMS for evaluation of altered level of consciousness.  According to family members she was fine 24 hours ago.  She began having increased "phlegm in her mouth", then later became less responsive.  She was brought here by EMS who was performing respirations by bag valve mask.  Shortly after arriving, she was placed on nasal cannula and appeared to be maintaining her saturations  adequately.  She was febrile with an initial temperature of 102.7.  She was also tachycardic and hypotensive.  This clinical picture was concerning for sepsis.  Cultures of the blood and urine were obtained as well as basic laboratory studies and lactate.  Her lactate was normal, but she was hydrated with saline due to her hypotension.  She was given vancomycin and Zosyn for presumptive sepsis.  Laboratory studies returned with a leukocytosis as well as acute renal failure with creatinine of 4.7.  This was markedly different from her most recent laboratory studies.  She continued to maintain her airway without intervention.  The case was discussed with the intensivist and the patient was evaluated by the critical care team.  She will be admitted for further treatment.  CRITICAL CARE Performed by: Veryl Speak Total critical care time: 45 minutes Critical care time was exclusive of separately billable procedures and treating other patients. Critical care was necessary to treat or prevent imminent or life-threatening deterioration. Critical care was time spent personally by me on the following activities: development of treatment plan with patient and/or surrogate as well as nursing, discussions with consultants, evaluation of patient's response to treatment, examination of patient, obtaining history from patient or surrogate, ordering and performing treatments and interventions, ordering and review of laboratory studies, ordering and review of radiographic studies, pulse oximetry and re-evaluation of patient's condition.   Final Clinical Impressions(s) / ED Diagnoses   Final diagnoses:  None    ED Discharge Orders    None       Veryl Speak, MD 01/03/18 [phone]

## 2018-01-04 LAB — PHOSPHORUS: Phosphorus: 1.5 mg/dL — ABNORMAL LOW (ref 2.5–4.6)

## 2018-01-04 LAB — CBC
HEMATOCRIT: 35.1 % — AB (ref 36.0–46.0)
HEMOGLOBIN: 11 g/dL — AB (ref 12.0–15.0)
MCH: 27.3 pg (ref 26.0–34.0)
MCHC: 31.3 g/dL (ref 30.0–36.0)
MCV: 87.1 fL (ref 78.0–100.0)
Platelets: 246 10*3/uL (ref 150–400)
RBC: 4.03 MIL/uL (ref 3.87–5.11)
RDW: 13.3 % (ref 11.5–15.5)
WBC: 11.5 10*3/uL — ABNORMAL HIGH (ref 4.0–10.5)

## 2018-01-04 LAB — BASIC METABOLIC PANEL
Anion gap: 9 (ref 5–15)
BUN: 36 mg/dL — AB (ref 8–23)
CALCIUM: 6.5 mg/dL — AB (ref 8.9–10.3)
CHLORIDE: 109 mmol/L (ref 98–111)
CO2: 20 mmol/L — AB (ref 22–32)
CREATININE: 1.86 mg/dL — AB (ref 0.44–1.00)
GFR calc Af Amer: 32 mL/min — ABNORMAL LOW (ref >60)
GFR calc non Af Amer: 28 mL/min — ABNORMAL LOW (ref >60)
GLUCOSE: 382 mg/dL — AB (ref 70–99)
Potassium: 3.2 mmol/L — ABNORMAL LOW (ref 3.5–5.1)
Sodium: 138 mmol/L (ref 135–145)

## 2018-01-04 LAB — URINE CULTURE: Culture: NO GROWTH

## 2018-01-04 LAB — GLUCOSE, CAPILLARY
GLUCOSE-CAPILLARY: 381 mg/dL — AB (ref 70–99)
GLUCOSE-CAPILLARY: 359 mg/dL — AB (ref 70–99)
GLUCOSE-CAPILLARY: 270 mg/dL — AB (ref 70–99)
GLUCOSE-CAPILLARY: 146 mg/dL — AB (ref 70–99)
GLUCOSE-CAPILLARY: 119 mg/dL — AB (ref 70–99)
Glucose-Capillary: 234 mg/dL — ABNORMAL HIGH (ref 70–99)
Glucose-Capillary: 381 mg/dL — ABNORMAL HIGH (ref 70–99)

## 2018-01-04 LAB — MAGNESIUM: Magnesium: 1.7 mg/dL (ref 1.7–2.4)

## 2018-01-04 LAB — PROCALCITONIN: PROCALCITONIN: 1.89 ng/mL

## 2018-01-04 LAB — HIV ANTIBODY (ROUTINE TESTING W REFLEX): HIV SCREEN 4TH GENERATION: NONREACTIVE

## 2018-01-04 MED ORDER — INSULIN ASPART 100 UNIT/ML ~~LOC~~ SOLN: 0.0000 [IU] | Freq: Three times a day (TID) | SUBCUTANEOUS | Status: DC

## 2018-01-04 MED ORDER — PIPERACILLIN-TAZOBACTAM 3.375 G IVPB
3.3750 g | Freq: Three times a day (TID) | INTRAVENOUS | Status: DC
  Administered 2018-01-04 – 2018-01-10 (×19): 3.375 g via INTRAVENOUS
  Filled 2018-01-04 (×22): qty 50

## 2018-01-04 MED ORDER — ADULT MULTIVITAMIN W/MINERALS CH
1.0000 | ORAL_TABLET | Freq: Every day | ORAL | Status: DC
  Administered 2018-01-05 – 2018-01-10 (×6): 1 via ORAL
  Filled 2018-01-04 (×7): qty 1

## 2018-01-04 MED ORDER — INSULIN REGULAR HUMAN 100 UNIT/ML IJ SOLN
0.0000 [IU] | INTRAMUSCULAR | Status: DC
Start: 2018-01-04 — End: 2018-01-05
  Filled 2018-01-04 (×49): qty 0.2

## 2018-01-04 MED ORDER — POTASSIUM PHOSPHATES 15 MMOLE/5ML IV SOLN
30.0000 mmol | Freq: Once | INTRAVENOUS | Status: AC
  Administered 2018-01-04: 30 mmol via INTRAVENOUS
  Filled 2018-01-04: qty 10

## 2018-01-04 MED ORDER — PHENOL 1.4 % MT LIQD
1.0000 | OROMUCOSAL | Status: DC | PRN
  Filled 2018-01-04: qty 177

## 2018-01-04 MED ORDER — LABETALOL HCL 5 MG/ML IV SOLN: 10.0000 mg | INTRAVENOUS | Status: DC | PRN

## 2018-01-04 MED ORDER — VANCOMYCIN HCL 10 G IV SOLR
1500.0000 mg | INTRAVENOUS | Status: DC
  Administered 2018-01-05: 1500 mg via INTRAVENOUS
  Filled 2018-01-04: qty 1500

## 2018-01-04 MED ORDER — ORAL CARE MOUTH RINSE
15.0000 mL | Freq: Two times a day (BID) | OROMUCOSAL | Status: DC
  Administered 2018-01-04 – 2018-01-09 (×11): 15 mL via OROMUCOSAL

## 2018-01-04 MED ORDER — INSULIN GLARGINE 100 UNIT/ML ~~LOC~~ SOLN
25.0000 [IU] | Freq: Every day | SUBCUTANEOUS | Status: DC
  Administered 2018-01-04: 25 [IU] via SUBCUTANEOUS
  Filled 2018-01-04 (×2): qty 0.25

## 2018-01-04 MED ORDER — INSULIN REGULAR HUMAN 100 UNIT/ML IJ SOLN
0.0000 [IU] | Freq: Every day | INTRAMUSCULAR | Status: DC
  Filled 2018-01-04 (×9): qty 0.05

## 2018-01-04 MED ORDER — FAMOTIDINE 20 MG PO TABS
20.0000 mg | ORAL_TABLET | Freq: Every day | ORAL | Status: DC
  Administered 2018-01-04 – 2018-01-09 (×6): 20 mg via ORAL
  Filled 2018-01-04 (×6): qty 1

## 2018-01-04 MED ORDER — MAGNESIUM SULFATE 2 GM/50ML IV SOLN
2.0000 g | Freq: Once | INTRAVENOUS | Status: AC
  Administered 2018-01-04: 2 g via INTRAVENOUS
  Filled 2018-01-04: qty 50

## 2018-01-04 MED ORDER — ENSURE ENLIVE PO LIQD
237.0000 mL | Freq: Two times a day (BID) | ORAL | Status: DC
  Administered 2018-01-04 – 2018-01-10 (×5): 237 mL via ORAL

## 2018-01-04 MED ORDER — POTASSIUM CHLORIDE 20 MEQ/15ML (10%) PO SOLN
40.0000 meq | Freq: Once | ORAL | Status: AC
  Administered 2018-01-04: 40 meq
  Filled 2018-01-04: qty 30

## 2018-01-04 MED ORDER — CHLORHEXIDINE GLUCONATE 0.12 % MT SOLN
15.0000 mL | Freq: Two times a day (BID) | OROMUCOSAL | Status: DC
  Administered 2018-01-04 – 2018-01-10 (×11): 15 mL via OROMUCOSAL
  Filled 2018-01-04 (×11): qty 15

## 2018-01-04 MED ORDER — INSULIN ASPART 100 UNIT/ML ~~LOC~~ SOLN: 0.0000 [IU] | Freq: Every day | SUBCUTANEOUS | Status: DC

## 2018-01-04 MED ORDER — FENTANYL CITRATE (PF) 100 MCG/2ML IJ SOLN
25.0000 ug | INTRAMUSCULAR | Status: DC | PRN
  Administered 2018-01-04: 50 ug via INTRAVENOUS
  Administered 2018-01-05: 75 ug via INTRAVENOUS
  Administered 2018-01-05: 50 ug via INTRAVENOUS
  Administered 2018-01-06: 75 ug via INTRAVENOUS
  Administered 2018-01-06 – 2018-01-07 (×2): 50 ug via INTRAVENOUS
  Filled 2018-01-04 (×7): qty 2

## 2018-01-04 MED ORDER — INSULIN GLARGINE 100 UNIT/ML ~~LOC~~ SOLN
25.0000 [IU] | Freq: Every day | SUBCUTANEOUS | Status: DC
  Filled 2018-01-04: qty 0.25

## 2018-01-04 MED ORDER — INSULIN REGULAR HUMAN 100 UNIT/ML IJ SOLN
0.0000 [IU] | Freq: Three times a day (TID) | INTRAMUSCULAR | Status: DC
Start: 2018-01-04 — End: 2018-01-04
  Administered 2018-01-04: 7 [IU] via SUBCUTANEOUS
  Filled 2018-01-04 (×25): qty 0.2

## 2018-01-04 NOTE — Progress Notes (Signed)
PULMONARY / CRITICAL CARE MEDICINE   Name: Natalie Macdonald MRN: [id number] DOB: September 27, 1955    ADMISSION DATE:  01/03/2018 CONSULTATION DATE:  01/03/2018  REFERRING MD:  Dr. Stark Jock  CHIEF COMPLAINT:  Sepsis  HISTORY OF PRESENT ILLNESS:   62 yo female brought to ER with altered mental status, fever 102.36F, hypotension, hypoxia.  Found to has sepsis from pneumonia with acute renal failure with poor clearance of medications, and hypovolemia.Marland Kitchen  PAST MEDICAL HISTORY: DM, HTN, HLD, Hypothyroidism, Sarcoidosis, s/p anterior cervical decompression with fusion, Vocal cord paralysis, CVA with Lt hemiparesis  SUBJECTIVE: More awake.  Tolerating SBT.  VITAL SIGNS: BP (!) 182/93   Pulse (!) 120   Temp 100.2 F (37.9 C)   Resp (!) 33   Ht 5\' 6"  (1.676 m)   Wt 249 lb (112.9 kg)   SpO2 100%   BMI 40.19 kg/m   HEMODYNAMICS: CVP:  [9 mmHg] 9 mmHg  VENTILATOR SETTINGS: Vent Mode: PRVC FiO2 (%):  [30 %-100 %] 30 % Set Rate:  [15 bmp-30 bmp] 30 bmp Vt Set:  [480 mL-510 mL] 480 mL PEEP:  [5 cmH20] 5 cmH20 Plateau Pressure:  [18 cmH20-22 cmH20] 20 cmH20  INTAKE / OUTPUT: I/O last 3 completed shifts: In: 7340.2 [I.V.:7297.7; IV Piggyback:42.5] Out: 625 [Urine:870]  PHYSICAL EXAMINATION:  General - alert Eyes - pupils reactive ENT - ETT in place Cardiac - regular, no murmur Chest - scattered rhonchi Abd - soft, non tender Ext - no edema Skin - stage 3 sacral wound Neuro - Lt hemiparesis  LABS:  BMET Recent Labs  Lab 01/03/18 0126 01/03/18 0615 01/04/18 0519  NA 134* 137 138  K 4.4 4.2 3.2*  CL 98 105 109  CO2 23 20* 20*  BUN 60* 59* 36*  CREATININE 4.73* 3.99* 1.86*  GLUCOSE 222* 245* 382*    Electrolytes Recent Labs  Lab 01/03/18 0126 01/03/18 0615 01/04/18 0519  CALCIUM 8.6* 7.6* 6.5*  MG  --  1.8 1.7  PHOS  --  4.0 1.5*    CBC Recent Labs  Lab 01/03/18 0126 01/03/18 0615 01/04/18 0519  WBC 16.1* 13.4* 11.5*  HGB 11.9* 10.6* 11.0*  HCT 38.5 35.1* 35.1*   PLT 278 243 246    Coag's No results for input(s): APTT, INR in the last 168 hours.  Sepsis Markers Recent Labs  Lab 01/03/18 0104 01/03/18 0401 01/03/18 0615 01/04/18 0519  LATICACIDVEN 1.75 1.95*  --   --   PROCALCITON  --   --  4.46 1.89    ABG Recent Labs  Lab 01/03/18 1237 01/03/18 1615  PHART 7.186* 7.257*  PCO2ART 46.4 32.2  PO2ART 364* 148*    Liver Enzymes Recent Labs  Lab 01/03/18 0126 01/03/18 0615  AST 80*  --   ALT 38  --   ALKPHOS 78  --   BILITOT 0.7  --   ALBUMIN 2.6* 2.1*    Cardiac Enzymes Recent Labs  Lab 01/03/18 0119  TROPONINI 0.04*    Glucose Recent Labs  Lab 01/03/18 0746 01/03/18 1142 01/03/18 1642 01/03/18 2030 01/04/18 0035 01/04/18 0344  GLUCAP 231* 229* 325* 376* 381* 381*    Imaging Ct Head Wo Contrast  Result Date: 01/03/2018 CLINICAL DATA:  Altered mental status.  Sepsis. EXAM: CT HEAD WITHOUT CONTRAST TECHNIQUE: Contiguous axial images were obtained from the base of the skull through the vertex without intravenous contrast. COMPARISON:  Head CT - earlier same day; FINDINGS: Brain: Scattered periventricular hypodensities compatible with microvascular ischemic disease. Old  lacunar infarct within the right cerebellar hemisphere (image 8, series 3). Gray-white differentiation is otherwise well maintained without CT evidence of superimposed acute large territory infarct. No intraparenchymal or extra-axial mass or hemorrhage. Grossly unchanged asymmetric prominence of the occipital horn of the left lateral ventricle. Unchanged size and configuration the ventricles and basilar cisterns. No midline shift. Vascular: Intracranial atherosclerosis. Skull: No displaced calvarial fracture. Sinuses/Orbits: Limited visualization of the paranasal sinuses and mastoid air cells is normal. No air-fluid levels. Other: Regional soft tissues appear normal. The patient is intubated. IMPRESSION: Similar findings of mild microvascular ischemic  disease without superimposed acute intracranial process. Electronically Signed   By: Sandi Mariscal M.D.   On: 01/03/2018 14:52   Dg Chest Port 1 View  Result Date: 01/03/2018 CLINICAL DATA:  ET tube and OG tube inserted, check tip position microphone is at the side of my mouth EXAM: PORTABLE CHEST 1 VIEW COMPARISON:  01/03/2018 at 0054 hours FINDINGS: Endotracheal tube tip projects 1 cm above the [place], well positioned. Nasal/orogastric tube tip projects below the [place] in the expected location of the mid esophagus. It does not pass into the stomach. It will need to be further inserted at least 15-20 cm to allow the tip to fully into the stomach. New left subclavian central venous line tip projects just above the caval atrial junction, well positioned. No pneumothorax. There is opacity in the medial upper lobes extending above both hila and at the left lung base, most likely atelectasis accentuated by low lung volumes. IMPRESSION: 1. Endotracheal tube and left subclavian central venous line are well positioned. 2. Nasogastric tube tip projects in the mid esophagus. Recommend further inserting 15-20 cm. 3. No pneumothorax. No change in lung aeration since the previous study. Electronically Signed   By: Lajean Manes M.D.   On: 01/03/2018 11:08   Dg Abd Portable 1v  Result Date: 01/03/2018 CLINICAL DATA:  NG tube placement. EXAM: PORTABLE ABDOMEN - 1 VIEW COMPARISON:  01/03/2018 at 10:46 a.m. FINDINGS: Nasogastric tube passes below the diaphragm, tip projecting in the gastric fundus. No other change since the prior exam. Moderate increased stool throughout the colon consistent with constipation. IMPRESSION: NG tube well positioned within the stomach. Electronically Signed   By: Lajean Manes M.D.   On: 01/03/2018 11:14   Dg Abd Portable 1v  Result Date: 01/03/2018 CLINICAL DATA:  Check gastric catheter placement EXAM: PORTABLE ABDOMEN - 1 VIEW COMPARISON:  None. FINDINGS: Scattered large and small bowel gas  is noted. Fecal material is noted throughout the colon consistent with a degree of constipation. No abnormal mass or abnormal calcifications are noted. Inserted gastric catheter is not visualized on this film. IMPRESSION: Known inserted gastric catheter is not visualized on this film. Changes consistent with constipation. Electronically Signed   By: Inez Catalina M.D.   On: 01/03/2018 11:05   STUDIES:  CT head 7/04 >> microvascular ischemic changes, old lacunar infarct Rt cerebellar hemisphere  CULTURES: Blood 7/04 >> Urine 7/04 >> Sacral wound 7/04 >>   ANTIBIOTICS: Vancomycin 7/04 >> Zosyn 7/04 >>  SIGNIFICANT EVENTS: 7/04 admit  LINES/TUBES: ETT 7/04 >> 7/05  Lt IJ CVL 7/04 >> Rt radial aline 7/04 >> 7/05  DISCUSSION: 62 yo female with altered mental status from sepsis with pneumonia, hypovolemia, AKI with ATN, poor clearance of medications.   ASSESSMENT / PLAN:  Acute hypoxic respiratory failure. - extubate 7/05  Sepsis with pneumonia. - day 2 of ABx - f/u CXR  Acute renal failure with ATN.  Metabolic acidosis >> resolved. Hypokalemia, hypophosphatemia. - baseline creatinine 0.69 from 10/10/16 - decrease IV fluid - d/c HCO3 from IV fluid - f/u BMET - replace electrolytes as needed  Hx of HTN, HLD. Prolonged QTc. - prn labetalol for SBP > 170, DBP > 105 - monitor heart rhythm on tele  Stage 3 sacral wound. - present prior to admission - wound care  DM type II with neuropathy. - SSI  Acute metabolic encephalopathy. Hx of CVA with Lt hemiparesis. - hold home narcotics - monitor mental status  DVT prophylaxis - SQ heparin SUP - protonix Nutrition - carb modified Goals of care - full code  CC time 32 minutes  Chesley Mires, MD [place] 01/04/2018, 8:54 AM

## 2018-01-04 NOTE — Progress Notes (Signed)
Results for SHERLE, MELLO (MRN [id number]) as of 01/04/2018 12:45  Ref. Range 01/03/2018 20:30 01/04/2018 00:35 01/04/2018 03:44 01/04/2018 08:14 01/04/2018 11:48  Glucose-Capillary Latest Ref Range: 70 - 99 mg/dL 376 (H) 381 (H) 381 (H) 359 (H) 270 (H)  Noted that patient's blood sugars are still greater than 180 mg/dl.  Received diabetes coordinator consult for insulin regimen.   Patient takes Toujeo 40-50 units every day and Apidra 18-20 units TID with meals at home.  Recommend starting Lantus 25 units daily and Novolog RESISTANT correction scale TID & HS.  When speaking with staff RN, patient's family states that she has had reaction to sites when given Novolog at home. Patient could bring Toujeo and Apidra from home if patient refused to take Lantus and Novolog.  Will continue to monitor blood sugars while in the hospital.  Harvel Ricks RN BSN CDE Diabetes Coordinator Pager: [phone]  8am-5pm

## 2018-01-04 NOTE — Progress Notes (Signed)
Initial Nutrition Assessment  DOCUMENTATION CODES:   Morbid obesity  INTERVENTION:   Ensure Enlive po BID, each supplement provides 350 kcal and 20 grams of protein  May benefit from changing supplement Juven BID once diet advanced to solids and pt taking adequate po. Each packet provides 80 calories, 8 grams of carbohydrate, 2.5  grams of protein (collagen), 7 grams of L-arginine and 7 grams of L-glutamine; supplement contains CaHMB, Vitamins C, E, B12 and Zinc to promote wound healing  Daily MVI  NUTRITION DIAGNOSIS:   Increased nutrient needs related to wound healing as evidenced by estimated needs.  GOAL:   Patient will meet greater than or equal to 90% of their needs   MONITOR:   PO intake, Supplement acceptance, Labs, Diet advancement, Weight trends  REASON FOR ASSESSMENT:   Low Braden(stage III pressure ulcer)    ASSESSMENT:   62 yo female admitted with AMS with sepsis, pneumonia, ARF from ATN. Required brief intubation on admission. Pt with hx of DM, HTN, HLD, sarcoidosis, vocal cord paralysis, CVA with left hemiparesis, stage III pressure ulcer  7/5 Extubated  Tearful on visit this AM. Husband and daughter at bedside. Family reports pt with good appetite prior to admission; eating well with 3 meals per day. Not taking any nutrition supplements; pt was taking MVI, Vitamin C.   FL diet ordered per SLP post extubation.   No weight encounters in the system; family denies weight loss  CBGs elevated, no nutritional intake; noted insulin regimen adjusted today  Labs: CBGs 270-381,  Phosphorus 1.5 (L), calcium 6.5, no albumin for correction, potassium 3.2 Meds: novolin, lantus, KCl, potassium phosphate   Diet Order:   Diet Order           Diet full liquid Room service appropriate? Yes; Fluid consistency: Thin  Diet effective now          EDUCATION NEEDS:   No education needs have been identified at this time  Skin:  Skin Assessment: Skin Integrity  Issues: Skin Integrity Issues:: Stage III Stage III: sacral with right and left gluteal fold (all connected)  Last BM:  no documented BM  Height:   Ht Readings from Last 1 Encounters:  01/03/18 5\' 6"  (1.676 m)    Weight:   Wt Readings from Last 1 Encounters:  01/03/18 249 lb (112.9 kg)    Ideal Body Weight:  59 kg  BMI:  Body mass index is 40.19 kg/m.  Estimated Nutritional Needs:   Kcal:  2000-2200 kcals  Protein:  110-130 g  Fluid:  >/= 1.8 L   Kerman Passey MS, RD, LDN, CNSC [phone] Pager  [phone] Weekend/On-Call Pager

## 2018-01-04 NOTE — Procedures (Signed)
Extubation Procedure Note  Patient Details:   Name: Natalie Macdonald DOB: Oct 26, 1955 MRN: [id number]   Airway Documentation:    Vent end date: 01/04/18 Vent end time: 0845   Positive cuff leak prior to extubation.  Evaluation  O2 sats: stable throughout Complications: No apparent complications Patient did tolerate procedure well. Bilateral Breath Sounds: Rhonchi   Yes- pt has clear ability to speak.  Pt extubated to 2L [place] with RN at bedside. No signs of distress or stridor.   Elwin Mocha 01/04/2018, 8:49 AM

## 2018-01-04 NOTE — Consult Note (Addendum)
WOC consult requested for sacrum wound.  This was already performed yesterday; refer to previous consult note for assessment and measurements, and topical treatment orders have been provided for staff nurses to perform. Please re-consult if further assistance is needed.  Thank-you,  Julien Girt MSN, [place], [place], [place], [place]

## 2018-01-04 NOTE — Evaluation (Signed)
Clinical/Bedside Swallow Evaluation Patient Details  Name: Natalie Macdonald MRN: [id number] Date of Birth: Sep 03, 1955  Today's Date: 01/04/2018 Time: SLP Start Time (ACUTE ONLY): 2778 SLP Stop Time (ACUTE ONLY): 1338 SLP Time Calculation (min) (ACUTE ONLY): 33 min  Past Medical History:  Past Medical History:  Diagnosis Date  . Diabetes mellitus without complication ([place])   . History of blood transfusion   . Hypertension   . Hyponatremia   . hypothyroid   . Sarcoidosis   . Sepsis ([place])   . Uterine fibroid    Past Surgical History:  The histories are not reviewed yet. Please review them in the "History" navigator section and refresh this [place]. HPI:  Pt is a 62 yo female with altered mental status from sepsis with pneumonia (CXR without acute infection), hypovolemia, AKI with ATN, poor clearance of medications. ETT 7/4-7/5. PMH: DM, HTN, HLD, GERD, Hypothyroidism, Sarcoidosis, s/p anterior cervical decompression with fusion, Vocal cord paralysis, CVA with Lt hemiparesis   Assessment / Plan / Recommendation Clinical Impression  Pt's family reports mild dysphagia s/p ACDF in April 2018 that resolved quickly despite persistent paralysis of vocal fold, which was assessed by ENT but without any intervention. They deny any prior h/o PNA and CXR is clear upon admission. Her voice is dysphonic at baseline but they do report it being softer at present despite intubation of <24 hours. Pt consumed larger boluses of thin liquids without overt signs of difficulty. When trying bites of grits she has facial grimacing, multiple swallows, and complaints of it getting "stuck." This does not occur with bites of applesauce though. She says her throat feels sore today. Recommend starting with full liquid diet with close supervision and monitoring for tolerance. SLP to f/u to assess for readiness to advance versus need for instrumental testing given baseline risk factors. Pt/family in agreement with plan.  SLP  Visit Diagnosis: Dysphagia, unspecified (R13.10)    Aspiration Risk  Mild aspiration risk;Moderate aspiration risk    Diet Recommendation Thin liquid;Other (Comment)(full liquids)   Liquid Administration via: Cup;Straw Medication Administration: Crushed with puree Supervision: Staff to assist with self feeding;Full supervision/cueing for compensatory strategies Compensations: Slow rate;Small sips/bites Postural Changes: Seated upright at 90 degrees;Remain upright for at least 30 minutes after po intake    Other  Recommendations Oral Care Recommendations: Oral care BID   Follow up Recommendations (tba)      Frequency and Duration min 2x/week  2 weeks       Prognosis Prognosis for Safe Diet Advancement: Good      Swallow Study   General HPI: Pt is a 62 yo female with altered mental status from sepsis with pneumonia (CXR without acute infection), hypovolemia, AKI with ATN, poor clearance of medications. ETT 7/4-7/5. PMH: DM, HTN, HLD, GERD, Hypothyroidism, Sarcoidosis, s/p anterior cervical decompression with fusion, Vocal cord paralysis, CVA with Lt hemiparesis Type of Study: Bedside Swallow Evaluation Previous Swallow Assessment: none in chart Diet Prior to this Study: Regular;Thin liquids Temperature Spikes Noted: Yes(102.4) Respiratory Status: Nasal cannula History of Recent Intubation: Yes Length of Intubations (days): 1 days Date extubated: 01/04/18 Behavior/Cognition: Alert;Cooperative;Requires cueing;Other (Comment)(repetetive, family helps provide hx) Oral Cavity Assessment: Within Functional Limits Oral Care Completed by SLP: No Oral Cavity - Dentition: Missing dentition Self-Feeding Abilities: Total assist Patient Positioning: Upright in bed Baseline Vocal Quality: Low vocal intensity Volitional Cough: Weak Volitional Swallow: Unable to elicit    Oral/Motor/Sensory Function Overall Oral Motor/Sensory Function: Within functional limits   Ice Chips Ice chips: Not  tested   Thin Liquid Thin Liquid: Within functional limits Presentation: Cup;Straw    Nectar Thick Nectar Thick Liquid: Not tested   Honey Thick Honey Thick Liquid: Not tested   Puree Puree: Impaired Presentation: Spoon Pharyngeal Phase Impairments: Multiple swallows;Other (comments)(grimacing)   Solid   GO   Solid: Not tested        Germain Osgood 01/04/2018,3:15 PM  Germain Osgood, M.A. CCC-SLP [phone]

## 2018-01-04 NOTE — Progress Notes (Signed)
Pharmacy Antibiotic Note  Natalie Macdonald is a 62 y.o. female admitted on 01/03/2018 with sepsis.  Pharmacy has been consulted for vancomycin and zosyn dosing. WBC trending down. Renal function improving quickly. SCr 3.99>1.86. Tm 101.8 but now afebrile.   Plan: -Vancomycin 1500 mg IV Q 48 hours  -Zosyn 3.375 gm IV Q 8 hours (EI infusion)  -Monitor CBC, renal fx, cultures and clinical progress   Height: 5\' 6"  (167.6 cm) Weight: 249 lb (112.9 kg) IBW/kg (Calculated) : 59.3  Temp (24hrs), Avg:101.4 F (38.6 C), Min:98.6 F (37 C), Max:102.4 F (39.1 C)  Recent Labs  Lab 01/03/18 0104 01/03/18 0126 01/03/18 0401 01/03/18 0615 01/04/18 0519  WBC  --  16.1*  --  13.4* 11.5*  CREATININE  --  4.73*  --  3.99* 1.86*  LATICACIDVEN 1.75  --  1.95*  --   --     Estimated Creatinine Clearance: 39.4 mL/min (A) (by C-G formula based on SCr of 1.86 mg/dL (H)).    Allergies  Allergen Reactions  . Grapefruit Flavor [Flavoring Agent]   . Procaine     Antimicrobials this admission: Zosyn 7/4 >>  Vancomycin 7/4 >>   Dose adjustments this admission: None   Microbiology results: 7/4 BCx:  7/4 UCx:  Neg 7/4 Wound: pending  7/4 MRSA PCR: positive   Thank you for allowing pharmacy to be a part of this patient's care.  Albertina Parr, PharmD., BCPS Clinical Pharmacist Clinical phone for 01/04/18 until 3:30pm: [phone] If after 3:30pm, please refer to [facility] for unit-specific pharmacist

## 2018-01-04 NOTE — Progress Notes (Signed)
[facility] Progress Note Patient Name: Natalie Macdonald DOB: 10-21-1955 MRN: [id number]   Date of Service  01/04/2018  HPI/Events of Note  Hypokalemia, hypophos, hypomag  eICU Interventions  Potassium, phos, and mag replaced     Intervention Category Intermediate Interventions: Electrolyte abnormality - evaluation and management  DETERDING,ELIZABETH 01/04/2018, 6:44 AM

## 2018-01-05 ENCOUNTER — Inpatient Hospital Stay (HOSPITAL_COMMUNITY): Payer: Medicare Other

## 2018-01-05 LAB — BASIC METABOLIC PANEL
ANION GAP: 8 (ref 5–15)
Anion gap: 7 (ref 5–15)
BUN: 16 mg/dL (ref 8–23)
BUN: 11 mg/dL (ref 8–23)
CHLORIDE: 106 mmol/L (ref 98–111)
CO2: 25 mmol/L (ref 22–32)
CO2: 25 mmol/L (ref 22–32)
CREATININE: 0.95 mg/dL (ref 0.44–1.00)
Calcium: 6.7 mg/dL — ABNORMAL LOW (ref 8.9–10.3)
Calcium: 7.1 mg/dL — ABNORMAL LOW (ref 8.9–10.3)
Chloride: 102 mmol/L (ref 98–111)
Creatinine, Ser: 0.86 mg/dL (ref 0.44–1.00)
GFR calc Af Amer: >60 mL/min (ref >60)
GFR calc non Af Amer: >60 mL/min (ref >60)
GLUCOSE: 226 mg/dL — AB (ref 70–99)
Glucose, Bld: 98 mg/dL (ref 70–99)
POTASSIUM: 2.6 mmol/L — AB (ref 3.5–5.1)
POTASSIUM: 3 mmol/L — AB (ref 3.5–5.1)
SODIUM: 138 mmol/L (ref 135–145)
Sodium: 135 mmol/L (ref 135–145)

## 2018-01-05 LAB — GLUCOSE, CAPILLARY
Glucose-Capillary: 114 mg/dL — ABNORMAL HIGH (ref 70–99)
Glucose-Capillary: 114 mg/dL — ABNORMAL HIGH (ref 70–99)
Glucose-Capillary: 155 mg/dL — ABNORMAL HIGH (ref 70–99)
Glucose-Capillary: 184 mg/dL — ABNORMAL HIGH (ref 70–99)
Glucose-Capillary: 211 mg/dL — ABNORMAL HIGH (ref 70–99)
Glucose-Capillary: 83 mg/dL (ref 70–99)

## 2018-01-05 LAB — CBC
HEMATOCRIT: 31.7 % — AB (ref 36.0–46.0)
HEMOGLOBIN: 9.8 g/dL — AB (ref 12.0–15.0)
MCH: 27.6 pg (ref 26.0–34.0)
MCHC: 30.9 g/dL (ref 30.0–36.0)
MCV: 89.3 fL (ref 78.0–100.0)
Platelets: 234 10*3/uL (ref 150–400)
RBC: 3.55 MIL/uL — ABNORMAL LOW (ref 3.87–5.11)
RDW: 14.3 % (ref 11.5–15.5)
WBC: 12.6 10*3/uL — AB (ref 4.0–10.5)

## 2018-01-05 LAB — PROCALCITONIN: Procalcitonin: 1.21 ng/mL

## 2018-01-05 LAB — MAGNESIUM: MAGNESIUM: 1.8 mg/dL (ref 1.7–2.4)

## 2018-01-05 LAB — PHOSPHORUS: Phosphorus: 1.2 mg/dL — ABNORMAL LOW (ref 2.5–4.6)

## 2018-01-05 MED ORDER — INSULIN ASPART 100 UNIT/ML ~~LOC~~ SOLN
0.0000 [IU] | Freq: Every day | SUBCUTANEOUS | Status: DC
Start: 2018-01-05 — End: 2018-01-05

## 2018-01-05 MED ORDER — POTASSIUM PHOSPHATES 15 MMOLE/5ML IV SOLN
30.0000 mmol | Freq: Once | INTRAVENOUS | Status: AC
  Administered 2018-01-05: 30 mmol via INTRAVENOUS
  Filled 2018-01-05: qty 10

## 2018-01-05 MED ORDER — POTASSIUM CHLORIDE 10 MEQ/50ML IV SOLN
10.0000 meq | INTRAVENOUS | Status: DC
  Administered 2018-01-05 (×2): 10 meq via INTRAVENOUS
  Filled 2018-01-05 (×6): qty 50

## 2018-01-05 MED ORDER — INSULIN REGULAR HUMAN 100 UNIT/ML IJ SOLN
0.0000 [IU] | Freq: Three times a day (TID) | INTRAMUSCULAR | Status: DC
  Administered 2018-01-06: 3 [IU] via SUBCUTANEOUS
  Filled 2018-01-05 (×27): qty 0.2

## 2018-01-05 MED ORDER — DULOXETINE HCL 60 MG PO CPEP
60.0000 mg | ORAL_CAPSULE | Freq: Every day | ORAL | Status: DC
  Administered 2018-01-05 – 2018-01-09 (×3): 60 mg via ORAL
  Filled 2018-01-05 (×5): qty 1

## 2018-01-05 MED ORDER — NON FORMULARY: 6.0000 [IU] | Freq: Three times a day (TID) | Status: DC

## 2018-01-05 MED ORDER — INSULIN ASPART 100 UNIT/ML ~~LOC~~ SOLN: 0.0000 [IU] | Freq: Three times a day (TID) | SUBCUTANEOUS | Status: DC

## 2018-01-05 MED ORDER — VANCOMYCIN HCL IN DEXTROSE 750-5 MG/150ML-% IV SOLN
750.0000 mg | Freq: Two times a day (BID) | INTRAVENOUS | Status: DC
  Administered 2018-01-05 – 2018-01-06 (×2): 750 mg via INTRAVENOUS
  Filled 2018-01-05 (×3): qty 150

## 2018-01-05 MED ORDER — INSULIN REGULAR HUMAN 100 UNIT/ML IJ SOLN
0.0000 [IU] | Freq: Every day | INTRAMUSCULAR | Status: DC
  Administered 2018-01-05: 2 [IU] via SUBCUTANEOUS
  Filled 2018-01-05 (×9): qty 0.05

## 2018-01-05 MED ORDER — OXYBUTYNIN CHLORIDE ER 15 MG PO TB24
15.0000 mg | ORAL_TABLET | Freq: Every day | ORAL | Status: DC
  Administered 2018-01-05 – 2018-01-10 (×6): 15 mg via ORAL
  Filled 2018-01-05 (×6): qty 1

## 2018-01-05 MED ORDER — LEVOTHYROXINE SODIUM 75 MCG PO TABS
150.0000 ug | ORAL_TABLET | Freq: Every day | ORAL | Status: DC
  Administered 2018-01-05 – 2018-01-10 (×6): 150 ug via ORAL
  Filled 2018-01-05 (×6): qty 2

## 2018-01-05 NOTE — Progress Notes (Signed)
Pharmacy Antibiotic Note  Natalie Macdonald is a 62 y.o. female admitted on 01/03/2018 with sepsis.  Pharmacy has been consulted for vancomycin and zosyn dosing. WBC 12.6. Renal function improving quickly. SCr 3.99>1.86> 0.95. Tm 100.4.  Plan: -Vancomycin 750 mg every 12 hours -Zosyn 3.375 gm IV Q 8 hours (EI infusion)  -Monitor CBC, renal fx, cultures and clinical progress   Height: 5\' 6"  (167.6 cm) Weight: 255 lb 11.7 oz (116 kg) IBW/kg (Calculated) : 59.3  Temp (24hrs), Avg:99.6 F (37.6 C), Min:98.6 F (37 C), Max:100.4 F (38 C)  Recent Labs  Lab 01/03/18 0104 01/03/18 0126 01/03/18 0401 01/03/18 0615 01/04/18 0519 01/05/18 0232 01/05/18 0450  WBC  --  16.1*  --  13.4* 11.5* 12.6*  --   CREATININE  --  4.73*  --  3.99* 1.86*  --  0.95  LATICACIDVEN 1.75  --  1.95*  --   --   --   --     Estimated Creatinine Clearance: 78.5 mL/min (by C-G formula based on SCr of 0.95 mg/dL).    Allergies  Allergen Reactions  . Grapefruit Flavor [Flavoring Agent]   . Novolog [Insulin Aspart]   . Procaine     Antimicrobials this admission: Zosyn 7/4 >>  Vancomycin 7/4 >>   Dose adjustments this admission: None   Microbiology results: 7/4 BCx:  7/4 UCx:  Neg 7/4 Wound: pending  7/4 MRSA PCR: positive   Thank you for allowing pharmacy to be a part of this patient's care.  Angus Seller, PharmD Pharmacy Resident 01/05/2018 11:09 AM

## 2018-01-05 NOTE — Progress Notes (Signed)
  Speech Language Pathology Treatment: Dysphagia  Patient Details Name: Natalie Macdonald MRN: [id number] DOB: Feb 08, 1956 Today's Date: 01/05/2018 Time: 3291-9166 SLP Time Calculation (min) (ACUTE ONLY): 18 min  Assessment / Plan / Recommendation Clinical Impression  SLP followed up for dysphagia. Pt reports voice is improving; voice remains high-pitched, low intensity. She consumed consecutive swallows of thin liquids via straw repeatedly without overt signs of aspiration, respiratory distress, changes in vocal quality. Applesauce was again well-tolerated. With trials of soft solid (softened cracker), pt with prolonged mastication due to dentition, but no overt signs of difficulty with pharyngeal phase. Pt denies sensation of sticking; multiple swallows are not noted today. Husband, RN report pt able to take medications whole in applesauce without difficulty. Recommend advancement to dys 1, thin liquids, meds whole in puree. SLP educated re: aspiration risks given intubation (although brief), and vocal cord paralysis, and discussed options including instrumental testing (MBS). Instructed husband, RN that if pt exhibiting overt signs of aspiration, keep NPO and page SLP. Husband expressed desire to be consulted prior to any instrumental testing. Will follow up for tolerance and instrumental testing if warranted and pt/family in agreement.      HPI HPI: Pt is a 62 yo female with altered mental status from sepsis with pneumonia (CXR without acute infection), hypovolemia, AKI with ATN, poor clearance of medications. ETT 7/4-7/5. PMH: DM, HTN, HLD, GERD, Hypothyroidism, Sarcoidosis, s/p anterior cervical decompression with fusion, Vocal cord paralysis, CVA with Lt hemiparesis      SLP Plan  Continue with current plan of care       Recommendations  Diet recommendations: Dysphagia 1 (puree);Thin liquid Liquids provided via: Straw;Cup Medication Administration: Whole meds with puree Compensations: Slow  rate;Small sips/bites                Oral Care Recommendations: Oral care BID Follow up Recommendations: Other (comment)(tba) SLP Visit Diagnosis: Dysphagia, unspecified (R13.10) Plan: Continue with current plan of care       [place], [place], [place] Speech-Language Pathologist [phone]   Aliene Altes 01/05/2018, 9:20 AM

## 2018-01-05 NOTE — Progress Notes (Signed)
[facility] ADULT ICU REPLACEMENT PROTOCOL FOR AM LAB REPLACEMENT ONLY  The patient does apply for the [facility] Adult ICU Electrolyte Replacment Protocol based on the criteria listed below:   1. Is GFR >/= 40 ml/min? Yes.    Patient's GFR today is >60 2. Is urine output >/= 0.5 ml/kg/hr for the last 6 hours? Yes.   Patient's UOP is .56 ml/kg/hr 3. Is BUN < 60 mg/dL? Yes.    Patient's BUN today is 16 4. Abnormal electrolyte(s): K-2.6 5. Ordered repletion with: per protocol 6. If a panic level lab has been reported, has the CCM MD in charge been notified? Yes.  .   Physician:  Dr. Harlene Ramus, [place] 01/05/2018 6:09 AM

## 2018-01-05 NOTE — Progress Notes (Signed)
NURSING PROGRESS NOTE  Natalie Macdonald [id number] Transfer Data: 01/05/2018 2:28 PM Attending Provider: Chesley Mires, MD XUX:[id number], [place], MD Code Status: FULL   Natalie Macdonald is a 62 y.o. female patient transferred from 91M  -No acute distress noted.  -No complaints of shortness of breath.  -No complaints of chest pain.   Cardiac Monitoring: Box # 23 in place. Cardiac monitor yields:normal sinus rhythm.  Last Documented Vital Signs: Blood pressure (!) 150/72, pulse (!) 102, temperature 100 F (37.8 C), resp. rate 20, height 5\' 6"  (1.676 m), weight 116 kg (255 lb 11.7 oz), SpO2 94 %.  IV Fluids:  IV in place, occlusive dsg intact without redness, IV access: Central Triple Lumen L chest w/ normal saline @ 50  Allergies:  Grapefruit flavor [flavoring agent]; Novolog [insulin aspart]; and Procaine  Past Medical History:   has a past medical history of Diabetes mellitus without complication ([place]), History of blood transfusion, Hypertension, Hyponatremia, hypothyroid, Sarcoidosis, Sepsis ([place]), and Uterine fibroid.  Past Surgical History:   has a past surgical history that includes Cholecystectomy; Anterior cervical decomp/discectomy fusion; and Thyroidectomy.  Social History:     Skin: intact except where otherwise charted  Patient/Family orientated to room. Information packet given to patient/family. Admission inpatient armband information verified with patient/family to include name and date of birth and placed on patient arm. Side rails up x 2, fall assessment and education completed with patient/family. Patient/family able to verbalize understanding of risk associated with falls and verbalized understanding to call for assistance before getting out of bed. Call light within reach. Patient/family able to voice and demonstrate understanding of unit orientation instructions.

## 2018-01-05 NOTE — Progress Notes (Signed)
Pt's husband needs to think overnight about whether or not he will bring in his wife's insulin from home.  He doesn't understand why our pharmacy doesn't carry it.  And a doctor told him that our pharmacy could get it for them.   So, he will decide tomorrow whether or not he brings it in.

## 2018-01-05 NOTE — Progress Notes (Signed)
Report called to 5W, patient transferred to bed. Awaiting help to get patient to unit. Modena Morrow E, RN 01/05/2018 10:28 AM

## 2018-01-05 NOTE — Progress Notes (Addendum)
PULMONARY / CRITICAL CARE MEDICINE   Name: Natalie Macdonald MRN: [id number] DOB: 06/07/1956    ADMISSION DATE:  01/03/2018 CONSULTATION DATE:  01/03/2018  REFERRING MD:  Dr. Stark Jock  CHIEF COMPLAINT:  Sepsis  HISTORY OF PRESENT ILLNESS:   62 yo female brought to ER with altered mental status, fever 102.16F, hypotension, hypoxia.  Found to has sepsis from pneumonia with acute renal failure with poor clearance of medications, and hypovolemia.Marland Kitchen  PAST MEDICAL HISTORY: DM, HTN, HLD, Hypothyroidism, Sarcoidosis, s/p anterior cervical decompression with fusion, Vocal cord paralysis, CVA with Lt hemiparesis  SUBJECTIVE: Has chest congestion.  Denies chest pain, abdominal pain, sore throat.  VITAL SIGNS: BP (!) 140/96   Pulse (!) 110   Temp (!) 100.4 F (38 C)   Resp 16   Ht 5\' 6"  (1.676 m)   Wt 255 lb 11.7 oz (116 kg)   LMP  (LMP Unknown)   SpO2 100%   BMI 41.28 kg/m   INTAKE / OUTPUT: I/O last 3 completed shifts: In: 4549.4 [P.O.:840; I.V.:2996.1; IV Piggyback:713.3] Out: 0093 [GHWEX:9371; Emesis/NG output:400]  PHYSICAL EXAMINATION:  General - pleasant Eyes - pupils reactive ENT - no stridor Cardiac - regular, tachycardic, no murmur Chest - scattered rhonchi Abd - soft, non tender Ext - 1+ edema Skin - stage 3 sacral wound Neuro - weak on Lt side Psych - normal mood   LABS:  BMET Recent Labs  Lab 01/03/18 0615 01/04/18 0519 01/05/18 0450  NA 137 138 138  K 4.2 3.2* 2.6*  CL 105 109 106  CO2 20* 20* 25  BUN 59* 36* 16  CREATININE 3.99* 1.86* 0.95  GLUCOSE 245* 382* 98    Electrolytes Recent Labs  Lab 01/03/18 0615 01/04/18 0519 01/05/18 0450  CALCIUM 7.6* 6.5* 6.7*  MG 1.8 1.7 1.8  PHOS 4.0 1.5* 1.2*    CBC Recent Labs  Lab 01/03/18 0615 01/04/18 0519 01/05/18 0232  WBC 13.4* 11.5* 12.6*  HGB 10.6* 11.0* 9.8*  HCT 35.1* 35.1* 31.7*  PLT 243 246 234    Coag's No results for input(s): APTT, INR in the last 168 hours.  Sepsis Markers Recent  Labs  Lab 01/03/18 0104 01/03/18 0401 01/03/18 0615 01/04/18 0519 01/05/18 0450  LATICACIDVEN 1.75 1.95*  --   --   --   PROCALCITON  --   --  4.46 1.89 1.21    ABG Recent Labs  Lab 01/03/18 1237 01/03/18 1615  PHART 7.186* 7.257*  PCO2ART 46.4 32.2  PO2ART 364* 148*    Liver Enzymes Recent Labs  Lab 01/03/18 0126 01/03/18 0615  AST 80*  --   ALT 38  --   ALKPHOS 78  --   BILITOT 0.7  --   ALBUMIN 2.6* 2.1*    Cardiac Enzymes Recent Labs  Lab 01/03/18 0119  TROPONINI 0.04*    Glucose Recent Labs  Lab 01/04/18 1148 01/04/18 1551 01/04/18 1949 01/04/18 2213 01/05/18 0018 01/05/18 0421  GLUCAP 270* 234* 146* 119* 114* 83    Imaging Dg Chest Port 1 View  Result Date: 01/05/2018 CLINICAL DATA:  Status postextubation EXAM: PORTABLE CHEST 1 VIEW COMPARISON:  January 03, 2018 FINDINGS: Endotracheal tube and nasogastric tube have been removed. Central catheter tip is in the superior vena cava. No pneumothorax. There is mild atelectasis in the left mid lung. Lungs elsewhere are clear. Heart is mildly enlarged with pulmonary vascularity normal. No adenopathy. 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 There is postoperative change in the lower cervical spine.  IMPRESSION: Central catheter tip in superior vena cava. No pneumothorax. Mild left midlung atelectasis. Lungs elsewhere clear. Stable cardiomegaly. Advanced arthropathy both shoulders. Electronically Signed   By: Lowella Grip III M.D.   On: 01/05/2018 07:14   STUDIES:  CT head 7/04 >> microvascular ischemic changes, old lacunar infarct Rt cerebellar hemisphere  CULTURES: Blood 7/04 >> Urine 7/04 >> negative Sacral wound 7/04 >>   ANTIBIOTICS: Vancomycin 7/04 >> Zosyn 7/04 >>  SIGNIFICANT EVENTS: 7/04 admit 7/05 extubated 7/06 transfer to tele  LINES/TUBES: ETT 7/04 >> 7/05  Lt IJ CVL 7/04 >> Rt radial aline 7/04 >> 7/05  DISCUSSION: 62 yo female with altered mental status from  sepsis with pneumonia, hypovolemia, AKI with ATN, poor clearance of medications.  Mental status, hemodynamics, respiratory status improved.  Please note she has 2 MRN numbers in Epic system.  ASSESSMENT / PLAN:  Acute hypoxic respiratory failure. - oxygen to keep SpO2 > 92%  Sepsis with pneumonia. - day 3 of ABx - f/u cx results and then narrow ABx - f/u CXR intermittently  Acute renal failure with ATN. Metabolic acidosis >> resolved. Hypokalemia, hypophosphatemia. - baseline creatinine 0.69 from 10/10/16 - IV fluids at 50 ml/hr - replace electrolytes >> keep CVL while getting IV electrolyte replacement - f/u BMET  Hx of HTN, HLD, sinus tachycardia. Prolonged QTc. - add lopressor 25 mg bid - prn labetalol for SBP > 170, DBP > 105 - monitor on tele  Stage 3 sacral wound. - present prior to admission - seen by wound care 7/04 - NS and pat dry, cover non-intact skin with aquacel Ag, secure with ABD pad, change daily  DM type II with neuropathy. Hypothyroidism. - reported to have allergy to novolog >> not certain about reaction - apidra tid - synthroid  Dysphagia. - f/u with speech therapy  Acute metabolic encephalopathy. Hx of CVA with Lt hemiparesis. - hold home narcotics - monitor mental status - resume cymbalta  DVT prophylaxis - SQ heparin SUP - no longer indicated; d/c protonix Nutrition - full liquid Goals of care - full code  Transfer to tele 7/06 >> [facility] 7/07 and PCCM off.  Chesley Mires, MD [facility] Pulmonary/Critical Care 01/05/2018, 7:46 AM

## 2018-01-06 DIAGNOSIS — R197 Diarrhea, unspecified: Secondary | ICD-10-CM | POA: Diagnosis present

## 2018-01-06 LAB — CBC
HCT: 28.1 % — ABNORMAL LOW (ref 36.0–46.0)
HEMOGLOBIN: 9 g/dL — AB (ref 12.0–15.0)
MCH: 27.6 pg (ref 26.0–34.0)
MCHC: 32 g/dL (ref 30.0–36.0)
MCV: 86.2 fL (ref 78.0–100.0)
Platelets: 202 10*3/uL (ref 150–400)
RBC: 3.26 MIL/uL — AB (ref 3.87–5.11)
RDW: 13.1 % (ref 11.5–15.5)
WBC: 10.6 10*3/uL — ABNORMAL HIGH (ref 4.0–10.5)

## 2018-01-06 LAB — BASIC METABOLIC PANEL
ANION GAP: 8 (ref 5–15)
BUN: 9 mg/dL (ref 8–23)
CALCIUM: 7.3 mg/dL — AB (ref 8.9–10.3)
CO2: 26 mmol/L (ref 22–32)
CREATININE: 0.79 mg/dL (ref 0.44–1.00)
Chloride: 101 mmol/L (ref 98–111)
GFR calc Af Amer: >60 mL/min (ref >60)
GLUCOSE: 183 mg/dL — AB (ref 70–99)
Potassium: 2.8 mmol/L — ABNORMAL LOW (ref 3.5–5.1)
Sodium: 135 mmol/L (ref 135–145)

## 2018-01-06 LAB — C DIFFICILE QUICK SCREEN W PCR REFLEX
C Diff antigen: NEGATIVE
C Diff interpretation: NOT DETECTED
C Diff toxin: NEGATIVE

## 2018-01-06 LAB — GLUCOSE, CAPILLARY
GLUCOSE-CAPILLARY: 165 mg/dL — AB (ref 70–99)
GLUCOSE-CAPILLARY: 190 mg/dL — AB (ref 70–99)
Glucose-Capillary: 141 mg/dL — ABNORMAL HIGH (ref 70–99)

## 2018-01-06 LAB — AEROBIC CULTURE W GRAM STAIN (SUPERFICIAL SPECIMEN): Gram Stain: NONE SEEN

## 2018-01-06 LAB — PHOSPHORUS: Phosphorus: 1.5 mg/dL — ABNORMAL LOW (ref 2.5–4.6)

## 2018-01-06 MED ORDER — K PHOS MONO-SOD PHOS DI & MONO 155-852-130 MG PO TABS
500.0000 mg | ORAL_TABLET | Freq: Two times a day (BID) | ORAL | Status: DC
  Administered 2018-01-06 – 2018-01-10 (×9): 500 mg via ORAL
  Filled 2018-01-06 (×10): qty 2

## 2018-01-06 MED ORDER — POTASSIUM CHLORIDE 10 MEQ/100ML IV SOLN
INTRAVENOUS | Status: AC
  Administered 2018-01-06: 10 meq via INTRAVENOUS
  Filled 2018-01-06: qty 100

## 2018-01-06 MED ORDER — ONDANSETRON HCL 4 MG/2ML IJ SOLN
INTRAMUSCULAR | Status: AC
  Administered 2018-01-06: 4 mg via INTRAVENOUS
  Filled 2018-01-06: qty 2

## 2018-01-06 MED ORDER — POTASSIUM CHLORIDE CRYS ER 20 MEQ PO TBCR
40.0000 meq | EXTENDED_RELEASE_TABLET | Freq: Every day | ORAL | Status: DC
  Administered 2018-01-06 – 2018-01-10 (×5): 40 meq via ORAL
  Filled 2018-01-06 (×7): qty 2

## 2018-01-06 MED ORDER — INSULIN GLARGINE 100 UNIT/ML ~~LOC~~ SOLN
20.0000 [IU] | Freq: Every day | SUBCUTANEOUS | Status: DC
  Administered 2018-01-06 – 2018-01-07 (×2): 20 [IU] via SUBCUTANEOUS
  Filled 2018-01-06 (×2): qty 0.2

## 2018-01-06 MED ORDER — SODIUM CHLORIDE 0.9% FLUSH
10.0000 mL | Freq: Two times a day (BID) | INTRAVENOUS | Status: DC
  Administered 2018-01-06 – 2018-01-10 (×4): 10 mL

## 2018-01-06 MED ORDER — ONDANSETRON HCL 4 MG/2ML IJ SOLN
4.0000 mg | Freq: Four times a day (QID) | INTRAMUSCULAR | Status: AC | PRN
  Administered 2018-01-06 – 2018-01-08 (×5): 4 mg via INTRAVENOUS
  Filled 2018-01-06 (×4): qty 2

## 2018-01-06 MED ORDER — VANCOMYCIN HCL IN DEXTROSE 1-5 GM/200ML-% IV SOLN
1000.0000 mg | Freq: Two times a day (BID) | INTRAVENOUS | Status: DC
  Administered 2018-01-06 – 2018-01-10 (×9): 1000 mg via INTRAVENOUS
  Filled 2018-01-06 (×10): qty 200

## 2018-01-06 MED ORDER — POTASSIUM CHLORIDE 10 MEQ/100ML IV SOLN
10.0000 meq | INTRAVENOUS | Status: AC
  Administered 2018-01-06 (×3): 10 meq via INTRAVENOUS
  Filled 2018-01-06 (×2): qty 100

## 2018-01-06 NOTE — Progress Notes (Signed)
[facility]                                                                              Patient Demographics  Natalie Macdonald, is a 62 y.o. female, DOB - 1956/03/16, JHE:[id number]  Admit date - 01/03/2018   Admitting Physician Renee Pain, MD  Outpatient Primary MD for the patient is Clifton Custard, MD  Outpatient specialists:   LOS - 3  days   Medical records reviewed and are as summarized below:    Chief Complaint  Patient presents with  . Altered Mental Status       Brief summary   Patient is a 62 year old female with history of diabetes, hyperlipidemia, hypertension, hypothyroidism, sarcoidosis, previous ACDF 2018 with residual vocal cord paralysis, left hemiparesis presented to ED with altered mental status.  Patient has been mostly wheelchair-bound since cervical surgery in 2018.  Daughter reported normal state of health on 6/1 and on 6/3 patient was found to be somnolent with intermittent episodes of consciousness. In ED, febrile 102.7, tachycardia, hypotensive, hypoxic with decreased responsiveness.  WBC count 16.1, creatinine 4.73.  Chest x-ray showed possible right upper lung infiltrate.  Due to sepsis with shock, patient was admitted by critical care to the ICU. Patient was transferred to hospitalist service on 01/06/2018  Assessment & Plan    Principal Problem:   Sepsis ([place]) with septic shock, CAP -Sepsis physiology now resolved, patient required vasopressors at the time of admission, admitted to critical care -Patient was intubated on 01/03/2018, and extubated on 01/04/2018 -Patient was placed on IV vancomycin and Zosyn.  Blood cultures negative till date, urine cultures negative -Wound culture from the Botox wound showed few Candida and rare staph aureus, sensitivities pending  Active Problems: Acute respiratory failure with hypoxia -Patient was intubated on 01/03/2018 and extubated on 7/5  -Currently O2 sats 100% on room air    Sacral  decubitus ulcer, stage III ([place]) -Wound care consult     AKI (acute kidney injury) ([place]) -At the time of admission, patient presented with creatinine of 3.99 likely due to #1, currently resolved with IV fluid hydration -Creatinine 0.79  Hypokalemia, hypophosphatemia Placed on potassium and phosphorus replacement  Hypertension, hyperlipidemia  -Continue Lopressor, labetalol as needed    Type 2 diabetes mellitus ([place]) -Patient reports allergy to NovoLog and does not want Humulin. -Placed on Lantus 20 units at bedtime, unclear which short-acting insulin will work, placed a diabetic coordinator consult    Acute metabolic encephalopathy -Likely due to #1, dehydration and hypovolemia -Mental status much more clear and responding to questions appropriately  -Continue to hold narcotics and sedatives.    Diarrhea -Follow C. Difficile  History of CVA with left-sided hemiparesis - No acute issues  Morbid obesity, BMI of 43 Patient was counseled strongly on diet and weight control  Code Status: Full CODE STATUS DVT Prophylaxis: Heparin subcu Family Communication: Discussed in detail with the patient, all imaging results, lab results explained to the patient    Disposition Plan: Start PT OT today, may need to skilled nursing facility  Time Spent in minutes 35 minutes  Procedures:  Intubation, extubation   Consultants:  Patient was admitted by CCM  Antimicrobials:      Medications  Scheduled Meds: . chlorhexidine  15 mL Mouth Rinse BID  . Chlorhexidine Gluconate Cloth  6 each Topical Q0600  . DULoxetine  60 mg Oral Daily  . famotidine  20 mg Oral QHS  . feeding supplement (ENSURE ENLIVE)  237 mL Oral BID BM  . heparin  5,000 Units Subcutaneous Q8H  . insulin glargine  20 Units Subcutaneous QHS  . levothyroxine  150 mcg Oral QAC breakfast  . mouth rinse  15 mL Mouth Rinse q12n4p  . multivitamin with minerals  1 tablet Oral Daily  . mupirocin ointment  1 application  Nasal BID  . oxybutynin  15 mg Oral Daily  . phosphorus  500 mg Oral BID  . potassium chloride  40 mEq Oral Daily  . sodium chloride flush  10-40 mL Intracatheter Q12H   Continuous Infusions: . sodium chloride 50 mL/hr at 01/04/18 1800  . piperacillin-tazobactam (ZOSYN)  IV 3.375 g (01/06/18 0546)  . vancomycin 750 mg (01/06/18 0058)   PRN Meds:.acetaminophen (TYLENOL) oral liquid 160 mg/5 mL, acetaminophen, albuterol, fentaNYL (SUBLIMAZE) injection, labetalol, phenol   Antibiotics   Anti-infectives (From admission, onward)   Start     Dose/Rate Route Frequency Ordered Stop   01/05/18 1200  vancomycin (VANCOCIN) IVPB 750 mg/150 ml premix     750 mg 150 mL/hr over 60 Minutes Intravenous Every 12 hours 01/05/18 1111     01/05/18 0200  vancomycin (VANCOCIN) 1,500 mg in sodium chloride 0.9 % 500 mL IVPB  Status:  Discontinued     1,500 mg 250 mL/hr over 120 Minutes Intravenous Every 48 hours 01/04/18 1220 01/05/18 1111   01/04/18 1300  piperacillin-tazobactam (ZOSYN) IVPB 3.375 g     3.375 g 12.5 mL/hr over 240 Minutes Intravenous Every 8 hours 01/04/18 1231     01/03/18 1100  vancomycin (VANCOCIN) IVPB 1000 mg/200 mL premix     1,000 mg 200 mL/hr over 60 Minutes Intravenous  Once 01/03/18 1028 01/03/18 1216   01/03/18 1100  piperacillin-tazobactam (ZOSYN) IVPB 2.25 g  Status:  Discontinued     2.25 g 100 mL/hr over 30 Minutes Intravenous Every 6 hours 01/03/18 1028 01/04/18 1231   01/03/18 0100  vancomycin (VANCOCIN) IVPB 1000 mg/200 mL premix     1,000 mg 200 mL/hr over 60 Minutes Intravenous  Once 01/03/18 0056 01/03/18 0237   01/03/18 0100  piperacillin-tazobactam (ZOSYN) IVPB 3.375 g     3.375 g 12.5 mL/hr over 240 Minutes Intravenous  Once 01/03/18 0056 01/03/18 0158        Subjective:   Hiilani Jetter was seen and examined today. Currently feeling better, overnight had diarrhea mild abdominal pain which appears to have resolved today. Patient denies dizziness, chest  pain, shortness of breath.  No fevers or chills.  Objective:   Vitals:   01/05/18 1500 01/05/18 2244 01/06/18 0421 01/06/18 0510  BP:  (!) 154/90  (!) 161/86  Pulse:  90  93  Resp:  18  18  Temp: 98.9 F (37.2 C) 99 F (37.2 C)  98.4 F (36.9 C)  TempSrc: Oral     SpO2:  98%  100%  Weight:   121 kg (266 lb 12.1 oz)   Height:        Intake/Output Summary (Last 24 hours) at 01/06/2018 1049 Last data filed at 01/06/2018 1000 Gross per 24 hour  Intake 1244.29 ml  Output 1741 ml  Net -496.71 ml  Wt Readings from Last 3 Encounters:  01/06/18 121 kg (266 lb 12.1 oz)     Exam  General: Alert and oriented x 3, NAD  Eyes:   HEENT:  Atraumatic, normocephalic, normal oropharynx  Cardiovascular: S1 S2 auscultated, Regular rate and rhythm.  Respiratory: Bilateral scattered rhonchi  Gastrointestinal: Obese, soft, nontender, nondistended, + bowel sounds  Ext: 1+ pedal edema bilaterally  Neuro: no new deficits  Musculoskeletal: No digital cyanosis, clubbing  Skin: Bilateral stage 3 sacral wounds  Psych: Normal affect and demeanor, alert and oriented x3    Data Reviewed:  I have personally reviewed following labs and imaging studies  Micro Results Recent Results (from the past 240 hour(s))  Blood Culture (routine x 2)     Status: None (Preliminary result)   Collection Time: 01/03/18  1:20 AM  Result Value Ref Range Status   Specimen Description BLOOD RIGHT HAND  Final   Special Requests   Final    BOTTLES DRAWN AEROBIC ONLY Blood Culture results may not be optimal due to an inadequate volume of blood received in culture bottles   Culture   Final    NO GROWTH 2 DAYS Performed at [facility], [place] [address], [place], [place] [address]    Report Status PENDING  Incomplete  Blood Culture (routine x 2)     Status: None (Preliminary result)   Collection Time: 01/03/18  1:35 AM  Result Value Ref Range Status   Specimen Description BLOOD RIGHT ARM  Final    Special Requests   Final    BOTTLES DRAWN AEROBIC AND ANAEROBIC Blood Culture results may not be optimal due to an excessive volume of blood received in culture bottles   Culture   Final    NO GROWTH 2 DAYS Performed at [facility], [place] [address] [place], [place] [address]    Report Status PENDING  Incomplete  Urine culture     Status: None   Collection Time: 01/03/18  2:38 AM  Result Value Ref Range Status   Specimen Description URINE, CATHETERIZED  Final   Special Requests NONE  Final   Culture   Final    NO GROWTH Performed at [facility], [place] [address] [place], [place] [address]    Report Status 01/04/2018 FINAL  Final  MRSA PCR Screening     Status: Abnormal   Collection Time: 01/03/18  4:53 AM  Result Value Ref Range Status   MRSA by PCR POSITIVE (A) NEGATIVE Final    Comment:        The GeneXpert MRSA Assay (FDA approved for NASAL specimens only), is one component of a comprehensive MRSA colonization surveillance program. It is not intended to diagnose MRSA infection nor to guide or monitor treatment for MRSA infections. RESULT CALLED TO, READ BACK BY AND VERIFIED WITH: A. Lewis RN 9:20 01/03/18 ([email or web address]) Performed at [facility], [place] [address], [place],  [address]   Aerobic Culture (superficial specimen)     Status: None (Preliminary result)   Collection Time: 01/03/18  4:53 AM  Result Value Ref Range Status   Specimen Description SKIN  Final   Special Requests NONE  Final   Gram Stain NO WBC SEEN NO ORGANISMS SEEN   Final   Culture   Final    FEW CANDIDA ALBICANS RARE STAPHYLOCOCCUS AUREUS SUSCEPTIBILITIES TO FOLLOW Performed at [facility], [place] [address], [place],  [address]    Report Status PENDING  Incomplete    Radiology Reports Ct Head Wo Contrast  Result Date: 01/03/2018 CLINICAL DATA:  Altered mental status.  Sepsis. EXAM: CT HEAD WITHOUT CONTRAST TECHNIQUE: Contiguous axial images were obtained from  the base of the skull through the vertex without intravenous contrast. COMPARISON:  Head CT - earlier same day; FINDINGS: Brain: Scattered periventricular hypodensities compatible with microvascular ischemic disease. Old lacunar infarct within the right cerebellar hemisphere (image 8, series 3). Gray-white differentiation is otherwise well maintained without CT evidence of superimposed acute large territory infarct. No intraparenchymal or extra-axial mass or hemorrhage. Grossly unchanged asymmetric prominence of the occipital horn of the left lateral ventricle. Unchanged size and configuration the ventricles and basilar cisterns. No midline shift. Vascular: Intracranial atherosclerosis. Skull: No displaced calvarial fracture. Sinuses/Orbits: Limited visualization of the paranasal sinuses and mastoid air cells is normal. No air-fluid levels. Other: Regional soft tissues appear normal. The patient is intubated. IMPRESSION: Similar findings of mild microvascular ischemic disease without superimposed acute intracranial process. Electronically Signed   By: Sandi Mariscal M.D.   On: 01/03/2018 14:52   Ct Head Wo Contrast  Result Date: 01/03/2018 CLINICAL DATA:  Unresponsiveness. Last seen normal greater than 24 hours ago. EXAM: CT HEAD WITHOUT CONTRAST TECHNIQUE: Contiguous axial images were obtained from the base of the skull through the vertex without intravenous contrast. COMPARISON:  None. FINDINGS: Brain: Mild cerebral atrophy. No evidence of acute infarction, hemorrhage, hydrocephalus, extra-axial collection or mass lesion/mass effect. Vascular: Intracranial arterial vascular calcifications are present. Skull: Normal. Negative for fracture or focal lesion. Sinuses/Orbits: No acute finding. Other: None. IMPRESSION: No acute intracranial abnormalities.  Mild cerebral atrophy. Electronically Signed   By: Lucienne Capers M.D.   On: 01/03/2018 01:57   Dg Chest Port 1 View  Result Date: 01/05/2018 CLINICAL DATA:   Status postextubation EXAM: PORTABLE CHEST 1 VIEW COMPARISON:  January 03, 2018 FINDINGS: Endotracheal tube and nasogastric tube have been removed. Central catheter tip is in the superior vena cava. No pneumothorax. There is mild atelectasis in the left mid lung. Lungs elsewhere are clear. Heart is mildly enlarged with pulmonary vascularity normal. No adenopathy. 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 There is postoperative change in the lower cervical spine. IMPRESSION: Central catheter tip in superior vena cava. No pneumothorax. Mild left midlung atelectasis. Lungs elsewhere clear. Stable cardiomegaly. Advanced arthropathy both shoulders. Electronically Signed   By: Lowella Grip III M.D.   On: 01/05/2018 07:14   Dg Chest Port 1 View  Result Date: 01/03/2018 CLINICAL DATA:  ET tube and OG tube inserted, check tip position microphone is at the side of my mouth EXAM: PORTABLE CHEST 1 VIEW COMPARISON:  01/03/2018 at 0054 hours FINDINGS: Endotracheal tube tip projects 1 cm above the [place], well positioned. Nasal/orogastric tube tip projects below the [place] in the expected location of the mid esophagus. It does not pass into the stomach. It will need to be further inserted at least 15-20 cm to allow the tip to fully into the stomach. New left subclavian central venous line tip projects just above the caval atrial junction, well positioned. No pneumothorax. There is opacity in the medial upper lobes extending above both hila and at the left lung base, most likely atelectasis accentuated by low lung volumes. IMPRESSION: 1. Endotracheal tube and left subclavian central venous line are well positioned. 2. Nasogastric tube tip projects in the mid esophagus. Recommend further inserting 15-20 cm. 3. No pneumothorax. No change in lung aeration since the previous study. Electronically Signed  By: Lajean Manes M.D.   On: 01/03/2018 11:08   Dg Chest Portable 1 View  Result Date: 01/03/2018 CLINICAL  DATA:  Patient is unresponsive. EXAM: PORTABLE CHEST 1 VIEW COMPARISON:  None. FINDINGS: Shallow inspiration. Normal heart size and pulmonary vascularity. No focal airspace disease or consolidation in the lungs. No blunting of costophrenic angles. No pneumothorax. Mediastinal contours appear intact. Prominent degenerative changes in the shoulders. IMPRESSION: Shallow inspiration.  No evidence of active pulmonary disease. Electronically Signed   By: Lucienne Capers M.D.   On: 01/03/2018 01:17   Dg Abd Portable 1v  Result Date: 01/03/2018 CLINICAL DATA:  NG tube placement. EXAM: PORTABLE ABDOMEN - 1 VIEW COMPARISON:  01/03/2018 at 10:46 a.m. FINDINGS: Nasogastric tube passes below the diaphragm, tip projecting in the gastric fundus. No other change since the prior exam. Moderate increased stool throughout the colon consistent with constipation. IMPRESSION: NG tube well positioned within the stomach. Electronically Signed   By: Lajean Manes M.D.   On: 01/03/2018 11:14   Dg Abd Portable 1v  Result Date: 01/03/2018 CLINICAL DATA:  Check gastric catheter placement EXAM: PORTABLE ABDOMEN - 1 VIEW COMPARISON:  None. FINDINGS: Scattered large and small bowel gas is noted. Fecal material is noted throughout the colon consistent with a degree of constipation. No abnormal mass or abnormal calcifications are noted. Inserted gastric catheter is not visualized on this film. IMPRESSION: Known inserted gastric catheter is not visualized on this film. Changes consistent with constipation. Electronically Signed   By: Inez Catalina M.D.   On: 01/03/2018 11:05    Lab Data:  CBC: Recent Labs  Lab 01/03/18 0126 01/03/18 0615 01/04/18 0519 01/05/18 0232 01/06/18 0923  WBC 16.1* 13.4* 11.5* 12.6* 10.6*  NEUTROABS 11.9*  --   --   --   --   HGB 11.9* 10.6* 11.0* 9.8* 9.0*  HCT 38.5 35.1* 35.1* 31.7* 28.1*  MCV 89.5 91.2 87.1 89.3 86.2  PLT 278 243 246 234 326   Basic Metabolic Panel: Recent Labs  Lab  01/03/18 0615 01/04/18 0519 01/05/18 0450 01/05/18 1937 01/06/18 0409  NA 137 138 138 135 135  K 4.2 3.2* 2.6* 3.0* 2.8*  CL 105 109 106 102 101  CO2 20* 20* 25 25 26   GLUCOSE 245* 382* 98 226* 183*  BUN 59* 36* 16 11 9   CREATININE 3.99* 1.86* 0.95 0.86 0.79  CALCIUM 7.6* 6.5* 6.7* 7.1* 7.3*  MG 1.8 1.7 1.8  --   --   PHOS 4.0 1.5* 1.2*  --  1.5*   GFR: Estimated Creatinine Clearance: 95.4 mL/min (by C-G formula based on SCr of 0.79 mg/dL). Liver Function Tests: Recent Labs  Lab 01/03/18 0126 01/03/18 0615  AST 80*  --   ALT 38  --   ALKPHOS 78  --   BILITOT 0.7  --   PROT 6.7  --   ALBUMIN 2.6* 2.1*   No results for input(s): LIPASE, AMYLASE in the last 168 hours. No results for input(s): AMMONIA in the last 168 hours. Coagulation Profile: No results for input(s): INR, PROTIME in the last 168 hours. Cardiac Enzymes: Recent Labs  Lab 01/03/18 0119  TROPONINI 0.04*   BNP (last 3 results) No results for input(s): PROBNP in the last 8760 hours. HbA1C: No results for input(s): HGBA1C in the last 72 hours. CBG: Recent Labs  Lab 01/05/18 0828 01/05/18 1222 01/05/18 1725 01/05/18 2241 01/06/18 0800  GLUCAP 114* 155* 184* 211* 141*   Lipid Profile: No results for  input(s): CHOL, HDL, LDLCALC, TRIG, CHOLHDL, LDLDIRECT in the last 72 hours. Thyroid Function Tests: No results for input(s): TSH, T4TOTAL, FREET4, T3FREE, THYROIDAB in the last 72 hours. Anemia Panel: No results for input(s): VITAMINB12, FOLATE, FERRITIN, TIBC, IRON, RETICCTPCT in the last 72 hours. Urine analysis:    Component Value Date/Time   COLORURINE AMBER (A) 01/03/2018 0237   APPEARANCEUR HAZY (A) 01/03/2018 0237   LABSPEC 1.019 01/03/2018 0237   PHURINE 5.0 01/03/2018 0237   GLUCOSEU 50 (A) 01/03/2018 0237   HGBUR SMALL (A) 01/03/2018 0237   BILIRUBINUR MODERATE (A) 01/03/2018 0237   KETONESUR NEGATIVE 01/03/2018 0237   PROTEINUR NEGATIVE 01/03/2018 0237   NITRITE NEGATIVE 01/03/2018  0237   LEUKOCYTESUR NEGATIVE 01/03/2018 0237     Tikesha Mort M.D. [facility] 01/06/2018, 10:49 AM  Pager: [phone] Between 7am to 7pm - call Pager - [phone]  After 7pm go to [email or web address] - password [email or web address]  Call night coverage person covering after 7pm

## 2018-01-06 NOTE — Progress Notes (Signed)
  Speech Language Pathology Treatment: Dysphagia  Patient Details Name: Natalie Macdonald MRN: [id number] DOB: February 23, 1956 Today's Date: 01/06/2018 Time: 8127-5170 SLP Time Calculation (min) (ACUTE ONLY): 21 min  Assessment / Plan / Recommendation Clinical Impression  SLP followed up for dysphagia. Pt's husband in room, stating "this is ridiculous" pointing to pureed items on breakfast tray. Husband was present in room and agreed with plan yesterday when SLP advanced from clear liquids to dys 1/thin liquids. SLP educated re: conservative progression given pt's multiple risk factors for aspiration as well as difficulty with solids that was observed in initial evaluation. Only 2 trials of softened solid observed yesterday. SLP offered upgraded texture trials to assess safety for diet progression. Pt consumed thin liquids and purees, but refused solids. She chose to take her medications whole with liquid, grimacing while swallowing. SLP encouraged solids, however pt refused all other POs. Husband and pt expressed desire for pt to eat regular food. SLP explained that without observation, could not assess safety for regular solids and explained risks of aspiration; husband verbalized that he would like for pt to "try a regular diet and see how she does with it." SLP again discussed aspiration risks and benefits of instrumental testing to visualize swallow function, encouraged pt and husband to consider MBS or FEES. Educated re: overt signs of aspiration and recommended holding POs if pt is coughing, choking or exhibiting signs of respiratory distress. Will follow up for tolerance, education, possible instrumental testing if pt/husband wish to proceed.     HPI HPI: Pt is a 62 yo female with altered mental status from sepsis with pneumonia (CXR without acute infection), hypovolemia, AKI with ATN, poor clearance of medications. ETT 7/4-7/5. PMH: DM, HTN, HLD, GERD, Hypothyroidism, Sarcoidosis, s/p anterior cervical  decompression with fusion, Vocal cord paralysis, CVA with Lt hemiparesis      SLP Plan  Continue with current plan of care       Recommendations  Diet recommendations: Other(comment)(husband requests regular/thin liquids accepts aspiration ris) Liquids provided via: Straw;Cup Medication Administration: Whole meds with puree(one at a time) Compensations: Slow rate;Small sips/bites                Oral Care Recommendations: Oral care BID Follow up Recommendations: Other (comment)(tba) SLP Visit Diagnosis: Dysphagia, unspecified (R13.10) Plan: Continue with current plan of care       [place], [place], [place] Speech-Language Pathologist [phone]   Aliene Altes 01/06/2018, 10:40 AM

## 2018-01-06 NOTE — Progress Notes (Signed)
The patient had several loose stools so enteric precautions were initiated, then patient had a large solid stool after c.diff PCR and flexiseal was ordered.  Patient is still seeping loose stools onto dressings.  Dressings changed three times.  MD notified in order to determine if enteric precautions and PCR order were to be cancelled.  Will continue to monitor patient.

## 2018-01-06 NOTE — Progress Notes (Signed)
Pt's husband and daughter are not going to bring pt's insulin from home. They state that we should supply her medications while she is here.

## 2018-01-06 NOTE — Progress Notes (Signed)
Pharmacy Antibiotic Note  Natalie Macdonald is a 62 y.o. female admitted on 01/03/2018 with sepsis.  Pharmacy has been consulted for vancomycin and zosyn dosing. WBC 12.6>10.6. Renal function improving quickly. SCr 0.79. Tm 38F improving.  Plan: Increase Vancomycin to 1000 mg every 12 hours Zosyn 3.375 gm IV Q 8 hours (EI infusion)  Monitor CBC, renal fx, cultures and clinical progress   Height: 5\' 6"  (167.6 cm) Weight: 266 lb 12.1 oz (121 kg) IBW/kg (Calculated) : 59.3  Temp (24hrs), Avg:98.8 F (37.1 C), Min:98.4 F (36.9 C), Max:99 F (37.2 C)  Recent Labs  Lab 01/03/18 0104  01/03/18 0126 01/03/18 0401 01/03/18 0615 01/04/18 0519 01/05/18 0232 01/05/18 0450 01/05/18 1937 01/06/18 0409 01/06/18 0923  WBC  --   --  16.1*  --  13.4* 11.5* 12.6*  --   --   --  10.6*  CREATININE  --    < > 4.73*  --  3.99* 1.86*  --  0.95 0.86 0.79  --   LATICACIDVEN 1.75  --   --  1.95*  --   --   --   --   --   --   --    < > = values in this interval not displayed.    Estimated Creatinine Clearance: 95.4 mL/min (by C-G formula based on SCr of 0.79 mg/dL).    Allergies  Allergen Reactions  . Grapefruit Flavor [Flavoring Agent]   . Novolog [Insulin Aspart]   . Procaine     Antimicrobials this admission: Zosyn 7/4 >>  Vancomycin 7/4 >>   Dose adjustments this admission: None   Microbiology results: 7/4 BCx:  7/4 UCx:  Neg 7/4 Wound: pending  7/4 MRSA PCR: positive   Thank you for allowing pharmacy to be a part of this patient's care.  Angus Seller, PharmD Pharmacy Resident 01/06/2018 11:11 AM

## 2018-01-06 NOTE — Consult Note (Signed)
[place] Nurse requested for buttocks/scarum wound. Patient seen on 7/4 by my colleague, K. Baird Cancer and a second consult was requested on 7/5 for which another colleague, D. Engles, saw.  No change today from those two assessments, although Bedside RN Margarita Grizzle) reports episodes of frequent stooling and some difficulty turning due to patient's body habitus. I will today provide a mattress replacement with low air loss feature to reduce and redistribute pressure, facilitate turning and to better manage microclimate.  [place] nursing team will not follow, but will remain available to this patient, the nursing and medical teams.  Please re-consult if needed. Thanks, Maudie Flakes, MSN, RN, [place], [facility]  Pager# [phone]

## 2018-01-06 NOTE — Progress Notes (Signed)
Inpatient Diabetes Program Recommendations  AACE/ADA: New Consensus Statement on Inpatient Glycemic Control (2015)  Target Ranges:  Prepandial:   less than 140 mg/dL      Peak postprandial:   less than 180 mg/dL (1-2 hours)      Critically ill patients:  140 - 180 mg/dL   Review of Glycemic Control  Outpatient Diabetes medications: Patient takes Toujeo 40-50 units every day and Apidra 18-20 units TID with meals at home Current orders for Inpatient glycemic control: Lantus 20 units qhs  Received diabetes coordinator consult for insulin regimen.   Patient's family states that she has had reaction to sites when given Novolog at home. Patient could bring Apidra from home if patient refused to take Novolog.  Will continue to monitor blood sugars while in the hospital.  Tama Headings RN, MSN, BC-ADM, [facility] Inpatient Diabetes Coordinator Team Pager [phone] (8a-5p)

## 2018-01-07 LAB — BASIC METABOLIC PANEL
Anion gap: 7 (ref 5–15)
BUN: 6 mg/dL — AB (ref 8–23)
CHLORIDE: 99 mmol/L (ref 98–111)
CO2: 28 mmol/L (ref 22–32)
CREATININE: 0.79 mg/dL (ref 0.44–1.00)
Calcium: 7.5 mg/dL — ABNORMAL LOW (ref 8.9–10.3)
GFR calc Af Amer: >60 mL/min (ref >60)
GLUCOSE: 203 mg/dL — AB (ref 70–99)
Potassium: 2.6 mmol/L — CL (ref 3.5–5.1)
Sodium: 134 mmol/L — ABNORMAL LOW (ref 135–145)

## 2018-01-07 LAB — CBC
HEMATOCRIT: 29.7 % — AB (ref 36.0–46.0)
Hemoglobin: 9.3 g/dL — ABNORMAL LOW (ref 12.0–15.0)
MCH: 27 pg (ref 26.0–34.0)
MCHC: 31.3 g/dL (ref 30.0–36.0)
MCV: 86.1 fL (ref 78.0–100.0)
PLATELETS: 223 10*3/uL (ref 150–400)
RBC: 3.45 MIL/uL — ABNORMAL LOW (ref 3.87–5.11)
RDW: 13.2 % (ref 11.5–15.5)
WBC: 12.4 10*3/uL — ABNORMAL HIGH (ref 4.0–10.5)

## 2018-01-07 LAB — POTASSIUM: Potassium: 3 mmol/L — ABNORMAL LOW (ref 3.5–5.1)

## 2018-01-07 LAB — GLUCOSE, CAPILLARY
GLUCOSE-CAPILLARY: 166 mg/dL — AB (ref 70–99)
GLUCOSE-CAPILLARY: 227 mg/dL — AB (ref 70–99)
GLUCOSE-CAPILLARY: 182 mg/dL — AB (ref 70–99)
Glucose-Capillary: 173 mg/dL — ABNORMAL HIGH (ref 70–99)

## 2018-01-07 LAB — MAGNESIUM: Magnesium: 1.6 mg/dL — ABNORMAL LOW (ref 1.7–2.4)

## 2018-01-07 MED ORDER — NON FORMULARY: 0.0000 [IU] | Freq: Three times a day (TID) | Status: DC

## 2018-01-07 MED ORDER — LOPERAMIDE HCL 2 MG PO CAPS: 2.0000 mg | ORAL_CAPSULE | Freq: Four times a day (QID) | ORAL | Status: DC | PRN

## 2018-01-07 MED ORDER — POTASSIUM CHLORIDE 10 MEQ/100ML IV SOLN
10.0000 meq | INTRAVENOUS | Status: AC
  Administered 2018-01-07 (×3): 10 meq via INTRAVENOUS
  Filled 2018-01-07 (×3): qty 100

## 2018-01-07 MED ORDER — MULTI FOR HER 50+ PO TABS: 1.0000 | ORAL_TABLET | Freq: Every day | ORAL | Status: DC

## 2018-01-07 MED ORDER — POTASSIUM CHLORIDE CRYS ER 20 MEQ PO TBCR: 20.0000 meq | EXTENDED_RELEASE_TABLET | Freq: Once | ORAL | Status: DC

## 2018-01-07 MED ORDER — POTASSIUM CHLORIDE 10 MEQ/100ML IV SOLN
INTRAVENOUS | Status: AC
  Administered 2018-01-07: 10 meq
  Filled 2018-01-07: qty 100

## 2018-01-07 MED ORDER — VITAMIN B-12 1000 MCG PO TABS
1000.0000 ug | ORAL_TABLET | Freq: Every day | ORAL | Status: DC
  Administered 2018-01-07 – 2018-01-10 (×4): 1000 ug via ORAL
  Filled 2018-01-07 (×4): qty 1

## 2018-01-07 MED ORDER — FLUTICASONE PROPIONATE 50 MCG/ACT NA SUSP: 2.0000 | NASAL | Status: DC | PRN

## 2018-01-07 MED ORDER — POTASSIUM CHLORIDE CRYS ER 20 MEQ PO TBCR
40.0000 meq | EXTENDED_RELEASE_TABLET | Freq: Once | ORAL | Status: AC
  Administered 2018-01-07: 40 meq via ORAL

## 2018-01-07 MED ORDER — TIZANIDINE HCL 4 MG PO TABS: 4.0000 mg | ORAL_TABLET | Freq: Three times a day (TID) | ORAL | Status: DC | PRN

## 2018-01-07 MED ORDER — MAGNESIUM SULFATE 50 % IJ SOLN
3.0000 g | Freq: Once | INTRAVENOUS | Status: AC
  Administered 2018-01-07: 3 g via INTRAVENOUS
  Filled 2018-01-07: qty 6

## 2018-01-07 MED ORDER — VITAMIN B-6 50 MG PO TABS
250.0000 mg | ORAL_TABLET | Freq: Every day | ORAL | Status: DC
  Administered 2018-01-07 – 2018-01-10 (×4): 250 mg via ORAL
  Filled 2018-01-07 (×4): qty 1

## 2018-01-07 MED ORDER — FENTANYL CITRATE (PF) 100 MCG/2ML IJ SOLN
12.5000 ug | Freq: Four times a day (QID) | INTRAMUSCULAR | Status: DC | PRN
  Administered 2018-01-07 – 2018-01-10 (×7): 12.5 ug via INTRAVENOUS
  Filled 2018-01-07 (×8): qty 2

## 2018-01-07 MED ORDER — FENTANYL CITRATE (PF) 100 MCG/2ML IJ SOLN: 12.5000 ug | INTRAMUSCULAR | Status: DC | PRN

## 2018-01-07 MED ORDER — POTASSIUM CHLORIDE CRYS ER 20 MEQ PO TBCR
40.0000 meq | EXTENDED_RELEASE_TABLET | Freq: Once | ORAL | Status: AC
  Administered 2018-01-07: 40 meq via ORAL
  Filled 2018-01-07: qty 2

## 2018-01-07 MED ORDER — AMITRIPTYLINE HCL 50 MG PO TABS
100.0000 mg | ORAL_TABLET | Freq: Once | ORAL | Status: AC
  Administered 2018-01-07: 100 mg via ORAL
  Filled 2018-01-07: qty 2

## 2018-01-07 MED ORDER — INSULIN REGULAR HUMAN 100 UNIT/ML IJ SOLN
0.0000 [IU] | Freq: Three times a day (TID) | INTRAMUSCULAR | Status: DC
Start: 2018-01-07 — End: 2018-01-10
  Administered 2018-01-07: 3 [IU] via SUBCUTANEOUS
  Administered 2018-01-07: 2 [IU] via SUBCUTANEOUS
  Administered 2018-01-08 – 2018-01-09 (×4): 1 [IU] via SUBCUTANEOUS
  Filled 2018-01-07 (×6): qty 0.09
  Filled 2018-01-07: qty 0.01
  Filled 2018-01-07 (×20): qty 0.09

## 2018-01-07 MED ORDER — MORPHINE SULFATE ER 15 MG PO TBCR
30.0000 mg | EXTENDED_RELEASE_TABLET | Freq: Two times a day (BID) | ORAL | Status: DC
  Administered 2018-01-07 – 2018-01-10 (×7): 30 mg via ORAL
  Filled 2018-01-07 (×7): qty 2

## 2018-01-07 NOTE — Consult Note (Signed)
[facility] for Infectious Disease    Date of Admission:  01/03/2018           Day 6 vancomycin        Day 6 piperacillin tazobactam       Reason for Consult: Sepsis of uncertain cause    Referring Provider: Dr. Marijo Sanes  Assessment: The cause of her recent febrile illness and sepsis is not entirely clear.  She may very well have had some seeding of her bloodstream from her sacral wound even with negative blood cultures.  As soon as I entered the room this morning an adult female (? Husband) became somewhat agitated and asked repeatedly why I was there.  When I attempted to explain that I have been asked to help determine what might have caused her illness and how long to continue antibiotics he wanted to know why Dr. Tana Coast could not simply look in the computer to find this out.  I told him that her blood and urine cultures were negative.  He then asked again why I was there if there was no infection.  I then offered to leave the room and he responded "yes".  Therefore I was not able to examine Natalie Macdonald. However, I would recommend that vancomycin and piperacillin tazobactam be stopped because I do not find evidence of infection at this time.  Plan: 1. Recommend vancomycin and piperacillin tazobactam 2. I will sign off now  Principal Problem:   Sepsis ([place]) Active Problems:   Cellulitis   Sacral decubitus ulcer, stage III (HCC)   AKI (acute kidney injury) ([place])   Dehydration   Type 2 diabetes mellitus ([place])   Acute encephalopathy   Septic shock (HCC)   Acute respiratory failure with hypoxemia (HCC)   Diarrhea   Scheduled Meds: . chlorhexidine  15 mL Mouth Rinse BID  . Chlorhexidine Gluconate Cloth  6 each Topical Q0600  . DULoxetine  60 mg Oral Daily  . famotidine  20 mg Oral QHS  . feeding supplement (ENSURE ENLIVE)  237 mL Oral BID BM  . heparin  5,000 Units Subcutaneous Q8H  . insulin glargine  20 Units Subcutaneous QHS  . insulin regular  0-9 Units Subcutaneous  TID AC  . levothyroxine  150 mcg Oral QAC breakfast  . mouth rinse  15 mL Mouth Rinse q12n4p  . morphine  30 mg Oral BID  . multivitamin with minerals  1 tablet Oral Daily  . mupirocin ointment  1 application Nasal BID  . oxybutynin  15 mg Oral Daily  . phosphorus  500 mg Oral BID  . potassium chloride  40 mEq Oral Daily  . potassium chloride  40 mEq Oral Once  . vitamin B-6  250 mg Oral Daily  . sodium chloride flush  10-40 mL Intracatheter Q12H  . vitamin B-12  1,000 mcg Oral Daily   Continuous Infusions: . sodium chloride 50 mL/hr at 01/06/18 2112  . magnesium sulfate LVP 250-500 ml 3 g (01/07/18 1131)  . piperacillin-tazobactam (ZOSYN)  IV 3.375 g (01/07/18 0552)  . vancomycin 1,000 mg (01/07/18 0313)   PRN Meds:.acetaminophen (TYLENOL) oral liquid 160 mg/5 mL, acetaminophen, albuterol, fentaNYL (SUBLIMAZE) injection, fluticasone, labetalol, loperamide, ondansetron (ZOFRAN) IV, phenol, tiZANidine  HPI: Natalie Macdonald is a 62 y.o. female with multiple medical problems who was admitted on 01/02/2018 with fever, decreased mental status and hypoxia.  Started on empiric vancomycin and piperacillin tazobactam sepsis.  Concern for pneumonia though there  were no definitive infiltrates on chest x-ray.  She was noted to have a superficial sacral decubitus infection.  Blood and urine cultures have been negative.  She defervesced and improved promptly.   Review of Systems: Review of Systems  Unable to perform ROS: Other  Constitutional:       She asked that I leave her room.    Past Medical History:  Diagnosis Date  . Diabetes mellitus without complication ([place])   . History of blood transfusion   . Hypertension   . Hyponatremia   . hypothyroid   . Sarcoidosis   . Sepsis ([place])   . Uterine fibroid     Social History   Tobacco Use  . Smoking status: Not on file  Substance Use Topics  . Alcohol use: Not on file  . Drug use: Not on file    No family history on file. Allergies    Allergen Reactions  . Grapefruit Flavor [Flavoring Agent]   . Novolog [Insulin Aspart]   . Procaine     OBJECTIVE: Blood pressure (!) 147/83, pulse 93, temperature 99 F (37.2 C), temperature source Oral, resp. rate 19, height 5\' 6"  (1.676 m), weight 270 lb 8.1 oz (122.7 kg), SpO2 99 %.  Physical Exam  Constitutional:  She asked that I leave her room.    Lab Results Lab Results  Component Value Date   WBC 12.4 (H) 01/07/2018   HGB 9.3 (L) 01/07/2018   HCT 29.7 (L) 01/07/2018   MCV 86.1 01/07/2018   PLT 223 01/07/2018    Lab Results  Component Value Date   CREATININE 0.79 01/07/2018   BUN 6 (L) 01/07/2018   NA 134 (L) 01/07/2018   K 2.6 (LL) 01/07/2018   CL 99 01/07/2018   CO2 28 01/07/2018    Lab Results  Component Value Date   ALT 38 01/03/2018   AST 80 (H) 01/03/2018   ALKPHOS 78 01/03/2018   BILITOT 0.7 01/03/2018     Microbiology: Recent Results (from the past 240 hour(s))  Blood Culture (routine x 2)     Status: None (Preliminary result)   Collection Time: 01/03/18  1:20 AM  Result Value Ref Range Status   Specimen Description BLOOD RIGHT HAND  Final   Special Requests   Final    BOTTLES DRAWN AEROBIC ONLY Blood Culture results may not be optimal due to an inadequate volume of blood received in culture bottles   Culture   Final    NO GROWTH 3 DAYS Performed at [facility], [place] [address], [place], [place] [address]    Report Status PENDING  Incomplete  Blood Culture (routine x 2)     Status: None (Preliminary result)   Collection Time: 01/03/18  1:35 AM  Result Value Ref Range Status   Specimen Description BLOOD RIGHT ARM  Final   Special Requests   Final    BOTTLES DRAWN AEROBIC AND ANAEROBIC Blood Culture results may not be optimal due to an excessive volume of blood received in culture bottles   Culture   Final    NO GROWTH 3 DAYS Performed at [facility], [place] [address], [place],  [address]    Report Status PENDING   Incomplete  Urine culture     Status: None   Collection Time: 01/03/18  2:38 AM  Result Value Ref Range Status   Specimen Description URINE, CATHETERIZED  Final   Special Requests NONE  Final   Culture   Final  NO GROWTH Performed at [facility], [place] [address], [place], [place] [address]    Report Status 01/04/2018 FINAL  Final  MRSA PCR Screening     Status: Abnormal   Collection Time: 01/03/18  4:53 AM  Result Value Ref Range Status   MRSA by PCR POSITIVE (A) NEGATIVE Final    Comment:        The GeneXpert MRSA Assay (FDA approved for NASAL specimens only), is one component of a comprehensive MRSA colonization surveillance program. It is not intended to diagnose MRSA infection nor to guide or monitor treatment for MRSA infections. RESULT CALLED TO, READ BACK BY AND VERIFIED WITH: A. Lewis RN 9:20 01/03/18 ([email or web address]) Performed at [facility], [place] [address], [place], [place] [address]   Aerobic Culture (superficial specimen)     Status: None   Collection Time: 01/03/18  4:53 AM  Result Value Ref Range Status   Specimen Description SKIN  Final   Special Requests NONE  Final   Gram Stain   Final    NO WBC SEEN NO ORGANISMS SEEN Performed at [facility], [place] [address], [place], [place] [address]    Culture   Final    FEW METHICILLIN RESISTANT STAPHYLOCOCCUS AUREUS RARE CANDIDA ALBICANS    Report Status 01/06/2018 FINAL  Final   Organism ID, Bacteria METHICILLIN RESISTANT STAPHYLOCOCCUS AUREUS  Final      Susceptibility   Methicillin resistant staphylococcus aureus - MIC*    CIPROFLOXACIN >=8 RESISTANT Resistant     ERYTHROMYCIN >=8 RESISTANT Resistant     GENTAMICIN <=0.5 SENSITIVE Sensitive     OXACILLIN >=4 RESISTANT Resistant     TETRACYCLINE <=1 SENSITIVE Sensitive     VANCOMYCIN <=0.5 SENSITIVE Sensitive     TRIMETH/SULFA <=10 SENSITIVE Sensitive     CLINDAMYCIN <=0.25 SENSITIVE Sensitive     RIFAMPIN <=0.5 SENSITIVE Sensitive      Inducible Clindamycin NEGATIVE Sensitive     * FEW METHICILLIN RESISTANT STAPHYLOCOCCUS AUREUS  C difficile quick scan w PCR reflex     Status: None   Collection Time: 01/06/18  4:44 AM  Result Value Ref Range Status   C Diff antigen NEGATIVE NEGATIVE Final   C Diff toxin NEGATIVE NEGATIVE Final   C Diff interpretation No C. difficile detected.  Final    Comment: Performed at [facility], [place] [address], [place], [place] [address]    Michel Bickers, [facility] [phone] pager   [phone] cell 01/07/2018, 12:36 PM

## 2018-01-07 NOTE — Evaluation (Signed)
Physical Therapy Evaluation Patient Details Name: Natalie Macdonald MRN: [id number] DOB: Nov 06, 1955 Today's Date: 01/07/2018   History of Present Illness  Patient is a 62 y/o female presenting with altered mental status. CT head revealing Mild cerebral atrophy, No evidence of acute infarction, hemorrhage, hydrocephalus, extra-axial collection or mass lesion/mass effect. Also with wound to B buttocks. 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  Clinical Impression  Patient admitted with the above listed diagnosis. Patients husband present for session and seemingly agitated upon my entering. Husband quick to answer all questions regarding PLOF, with PT expressing the need for patient to attempt to answer to assess cognition and perceived PLOF. Patient requiring assist from husband prior to admission and was limited in mobility. Today only willing to perform supine to sit EOB with Mod A from PT for mobility. Currently recommending SNF to maximize functional mobility - however, patient and husband likely to refuse stating "you can not make her go anywhere" with PT verbalizing this was only a recommendation. PT to continue to follow acutely to maximize safe mobility.     Follow Up Recommendations SNF;Supervision/Assistance - 24 hour(patient and husband likely to refuse - may need HHPT)    Equipment Recommendations  None recommended by PT    Recommendations for Other Services       Precautions / Restrictions Precautions Precautions: Fall Restrictions Weight Bearing Restrictions: No      Mobility  Bed Mobility Overal bed mobility: Needs Assistance Bed Mobility: Supine to Sit;Sit to Supine     Supine to sit: Mod assist Sit to supine: Mod assist   General bed mobility comments: Mod A for positioning and completion of transfer  Transfers                 General transfer  comment: deferred per patient request  Ambulation/Gait                Stairs            Wheelchair Mobility    Modified Rankin (Stroke Patients Only)       Balance Overall balance assessment: Needs assistance Sitting-balance support: Single extremity supported;Feet unsupported Sitting balance-Leahy Scale: Fair Sitting balance - Comments: sitting EOB ~6 min; request to return to supine due to soreness at buttocks                                     Pertinent Vitals/Pain Pain Assessment: Faces Faces Pain Scale: No hurt    Home Living Family/patient expects to be discharged to:: Private residence Living Arrangements: Spouse/significant other   Type of Home: House         Home Equipment: Gilford Rile - 2 wheels;Walker - 4 wheels;Wheelchair - manual      Prior Function Level of Independence: Needs assistance   Gait / Transfers Assistance Needed: patient and husband reports he assist with trasnfers to w/c for primary means of mobility           Hand Dominance        Extremity/Trunk Assessment        Lower Extremity Assessment Lower Extremity Assessment: Generalized weakness;LLE deficits/detail LLE Deficits / Details: L LE with little intention movement; reliant on R LE and PT to assist LE management    Cervical / Trunk Assessment Cervical / Trunk Assessment: Kyphotic  Communication  Communication: No difficulties(very low voice due to prior vocal cord injury)  Cognition Arousal/Alertness: Awake/alert Behavior During Therapy: WFL for tasks assessed/performed Overall Cognitive Status: Difficult to assess                                 General Comments: husband answers all questions before she is able; PT then instructing patient to answer questions to assess congition and perceived PLOF      General Comments      Exercises     Assessment/Plan    PT Assessment Patient needs continued PT services  PT Problem  List Decreased strength;Decreased activity tolerance;Decreased mobility;Decreased balance;Decreased coordination;Decreased knowledge of use of DME;Decreased safety awareness       PT Treatment Interventions DME instruction;Gait training;Functional mobility training;Therapeutic activities;Therapeutic exercise;Balance training;Neuromuscular re-education;Patient/family education;Cognitive remediation    PT Goals (Current goals can be found in the Care Plan section)  Acute Rehab PT Goals Patient Stated Goal: return home PT Goal Formulation: With patient Time For Goal Achievement: 01/21/18 Potential to Achieve Goals: Fair    Frequency Min 3X/week   Barriers to discharge        Co-evaluation               AM-PAC PT "6 Clicks" Daily Activity  Outcome Measure Difficulty turning over in bed (including adjusting bedclothes, sheets and blankets)?: Unable Difficulty moving from lying on back to sitting on the side of the bed? : Unable Difficulty sitting down on and standing up from a chair with arms (e.g., wheelchair, bedside commode, etc,.)?: Unable Help needed moving to and from a bed to chair (including a wheelchair)?: Total Help needed walking in hospital room?: Total Help needed climbing 3-5 steps with a railing? : Total 6 Click Score: 6    End of Session   Activity Tolerance: Patient tolerated treatment well Patient left: in bed;with call bell/phone within reach;with family/visitor present Nurse Communication: Mobility status PT Visit Diagnosis: Other abnormalities of gait and mobility (R26.89);Muscle weakness (generalized) (M62.81)    Time: 8413-2440 PT Time Calculation (min) (ACUTE ONLY): 18 min   Charges:   PT Evaluation $PT Eval Moderate Complexity: 1 Mod     PT G Codes:        Lanney Gins, PT, DPT 01/07/18 3:29 PM

## 2018-01-07 NOTE — Progress Notes (Signed)
  Speech Language Pathology Treatment: Dysphagia  Patient Details Name: Natalie Macdonald MRN: [id number] DOB: 08-15-1955 Today's Date: 01/07/2018 Time: 1638-4665 SLP Time Calculation (min) (ACUTE ONLY): 18 min  Assessment / Plan / Recommendation Clinical Impression  Tx focused on family education as pt was sleeping upon SLP arrival and husband asked that she not be disturbed. Education was provided about risk factors for aspiration, concern for PNA upon admission, signs of aspiration for which to monitor. He verbalized his understanding of the information presented, but also stated that he has seen no overt signs of difficulty and that her primary problem is more reduced appetite, nausea. She remains afebrile per chart. Reviewed recommendation for FEES/MBS and he still does not want to pursue additional testing. He prefers to reach out to her ENT today to update him/her on current status. He will follow the ENT's recommendations regarding any swallowing testing, but until that time he does not want additional SLP f/u. All education completed and questions answered at this time, so will defer additional f/u per family wishes. Please reorder SLP if needed acutely.   HPI HPI: Pt is a 62 yo female with altered mental status from sepsis with pneumonia (CXR without acute infection), hypovolemia, AKI with ATN, poor clearance of medications. ETT 7/4-7/5. PMH: DM, HTN, HLD, GERD, Hypothyroidism, Sarcoidosis, s/p anterior cervical decompression with fusion, Vocal cord paralysis, CVA with Lt hemiparesis      SLP Plan  Discharge SLP treatment due to (comment)(family request)       Recommendations  Diet recommendations: Regular;Thin liquid;Other(comment)(per pt/family request) Liquids provided via: Straw;Cup Medication Administration: Whole meds with puree Supervision: Intermittent supervision to cue for compensatory strategies Compensations: Slow rate;Small sips/bites Postural Changes and/or Swallow  Maneuvers: Seated upright 90 degrees                Oral Care Recommendations: Oral care BID Follow up Recommendations: None SLP Visit Diagnosis: Dysphagia, unspecified (R13.10) Plan: Discharge SLP treatment due to (comment)(family request)       GO                Germain Osgood 01/07/2018, 9:17 AM  Germain Osgood, M.A. CCC-SLP [phone]

## 2018-01-07 NOTE — Progress Notes (Signed)
[facility]                                                                              Patient Demographics  Natalie Macdonald, is a 62 y.o. female, DOB - 11-Jun-1956, OVF:[id number]  Admit date - 01/03/2018   Admitting Physician Renee Pain, MD  Outpatient Primary MD for the patient is Clifton Custard, MD  Outpatient specialists:   LOS - 4  days   Medical records reviewed and are as summarized below:    Chief Complaint  Patient presents with  . Altered Mental Status       Brief summary   Patient is a 62 year old female with history of diabetes, hyperlipidemia, hypertension, hypothyroidism, sarcoidosis, previous ACDF 2018 with residual vocal cord paralysis, left hemiparesis presented to ED with altered mental status.  Patient has been mostly wheelchair-bound since cervical surgery in 2018.  Daughter reported normal state of health on 6/1 and on 6/3 patient was found to be somnolent with intermittent episodes of consciousness. In ED, febrile 102.7, tachycardia, hypotensive, hypoxic with decreased responsiveness.  WBC count 16.1, creatinine 4.73.  Chest x-ray showed possible right upper lung infiltrate.  Due to sepsis with shock, patient was admitted by critical care to the ICU. Patient was transferred to hospitalist service on 01/06/2018  Assessment & Plan    Principal Problem:   Sepsis ([place]) with septic shock, CAP -Sepsis physiology now resolved, patient required vasopressors at the time of admission, admitted to critical care -Patient was intubated on 01/03/2018, and extubated on 01/04/2018 -Blood cultures negative till date, urine cultures negative -Wound culture shows MRSA, currently on IV vancomycin and Zosyn, will continue the antibiotics - ID consulted, discussed with Dr. Megan Salon, will follow recommendations  Active Problems: Acute respiratory failure with hypoxia -Patient was intubated on 01/03/2018 and extubated on 7/5  -O2 sats 99% on room air   Sacral decubitus ulcer, stage III ([place]), present prior to the admission -Wound care following closely, continue wound care per recommendations    AKI (acute kidney injury) ([place]) -At the time of admission, patient presented with creatinine of 3.99 likely due to #1, currently resolved with IV fluid hydration -Creatinine normalized to 0.7  Hypokalemia, hypophosphatemia -Potassium 2.6, magnesium 1.6, placed on IV and oral replacement  - continue phosphorus replacement  Hypertension, hyperlipidemia  -BP currently stable, continue Lopressor, labetalol as needed     Type 2 diabetes mellitus ([place]) -Patient reports allergy to NovoLog and does not want Humulin. -Continue Lantus 20 units at bedtime, husband declined to bring Apidra from home - placed on regular insulin per sliding scale as recommended by the diabetic coordinator    Acute metabolic encephalopathy -Likely due to #1, dehydration and hypovolemia -Currently mental status improving, close to baseline    Diarrhea -C. difficile negative, diarrhea improving, loperamide as needed  History of CVA with left-sided hemiparesis - No new acute issues  Morbid obesity, BMI of 43 Patient was counseled on diet and weight control  Chronic pain syndrome -Patient and husband requesting her home pain medications to be started - Reviewed admitting note, patient had received Narcan 0.1 mg IV x2 at the time of admission  withthe significant improvement in the mental status - For now I have restarted MS Contin at patient's home dose, no oxycodone or Neurontin. - Placed on Flexeril  mg every 8 hours as needed, low-dose fentanyl IV every 6 hours as needed for breakthrough more severe pain  Code Status: Full CODE STATUS DVT Prophylaxis: Heparin subcu Family Communication: Discussed in detail with the patient, all imaging results, lab results explained to the patient and husband at the bedside   Disposition Plan: Home, patient has been declined  skilled nursing facility for rehab if needed.  Will provide home health services when patient medically ready  Time Spent in minutes 25 minutes  Procedures:  Intubation, extubation   Consultants:   Patient was admitted by CCM  Antimicrobials:      Medications  Scheduled Meds: . chlorhexidine  15 mL Mouth Rinse BID  . Chlorhexidine Gluconate Cloth  6 each Topical Q0600  . DULoxetine  60 mg Oral Daily  . famotidine  20 mg Oral QHS  . feeding supplement (ENSURE ENLIVE)  237 mL Oral BID BM  . heparin  5,000 Units Subcutaneous Q8H  . insulin glargine  20 Units Subcutaneous QHS  . levothyroxine  150 mcg Oral QAC breakfast  . mouth rinse  15 mL Mouth Rinse q12n4p  . morphine  30 mg Oral BID  . multivitamin with minerals  1 tablet Oral Daily  . mupirocin ointment  1 application Nasal BID  . oxybutynin  15 mg Oral Daily  . phosphorus  500 mg Oral BID  . potassium chloride  40 mEq Oral Daily  . potassium chloride  40 mEq Oral Once  . vitamin B-6  250 mg Oral Daily  . sodium chloride flush  10-40 mL Intracatheter Q12H  . vitamin B-12  1,000 mcg Oral Daily   Continuous Infusions: . potassium chloride    . sodium chloride 50 mL/hr at 01/06/18 2112  . magnesium sulfate LVP 250-500 ml 3 g (01/07/18 1131)  . piperacillin-tazobactam (ZOSYN)  IV 3.375 g (01/07/18 0552)  . vancomycin 1,000 mg (01/07/18 0313)   PRN Meds:.acetaminophen (TYLENOL) oral liquid 160 mg/5 mL, acetaminophen, albuterol, fentaNYL (SUBLIMAZE) injection, fluticasone, labetalol, loperamide, ondansetron (ZOFRAN) IV, phenol, tiZANidine   Antibiotics   Anti-infectives (From admission, onward)   Start     Dose/Rate Route Frequency Ordered Stop   01/06/18 1200  vancomycin (VANCOCIN) IVPB 1000 mg/200 mL premix     1,000 mg 200 mL/hr over 60 Minutes Intravenous Every 12 hours 01/06/18 1131     01/05/18 1200  vancomycin (VANCOCIN) IVPB 750 mg/150 ml premix  Status:  Discontinued     750 mg 150 mL/hr over 60 Minutes  Intravenous Every 12 hours 01/05/18 1111 01/06/18 1131   01/05/18 0200  vancomycin (VANCOCIN) 1,500 mg in sodium chloride 0.9 % 500 mL IVPB  Status:  Discontinued     1,500 mg 250 mL/hr over 120 Minutes Intravenous Every 48 hours 01/04/18 1220 01/05/18 1111   01/04/18 1300  piperacillin-tazobactam (ZOSYN) IVPB 3.375 g     3.375 g 12.5 mL/hr over 240 Minutes Intravenous Every 8 hours 01/04/18 1231     01/03/18 1100  vancomycin (VANCOCIN) IVPB 1000 mg/200 mL premix     1,000 mg 200 mL/hr over 60 Minutes Intravenous  Once 01/03/18 1028 01/03/18 1216   01/03/18 1100  piperacillin-tazobactam (ZOSYN) IVPB 2.25 g  Status:  Discontinued     2.25 g 100 mL/hr over 30 Minutes Intravenous Every 6 hours 01/03/18 1028 01/04/18 1231  01/03/18 0100  vancomycin (VANCOCIN) IVPB 1000 mg/200 mL premix     1,000 mg 200 mL/hr over 60 Minutes Intravenous  Once 01/03/18 0056 01/03/18 0237   01/03/18 0100  piperacillin-tazobactam (ZOSYN) IVPB 3.375 g     3.375 g 12.5 mL/hr over 240 Minutes Intravenous  Once 01/03/18 0056 01/03/18 0158        Subjective:   Natalie Macdonald was seen and examined today.  Somewhat sleepy today, however received fentanyl early in the morning.  Yesterday diarrhea with 5 BMs and during the day in 1 week during the night, improving.  Low-grade temp of 99.1 F, no chest pain, shortness of breath, nausea or vomiting.    Objective:   Vitals:   01/06/18 0510 01/06/18 2034 01/07/18 0604 01/07/18 0700  BP: (!) 161/86 (!) 154/85 (!) 147/83   Pulse: 93 75 93   Resp: 18 20 19    Temp: 98.4 F (36.9 C) 99.1 F (37.3 C) 99 F (37.2 C)   TempSrc:  Oral Oral   SpO2: 100% 100% 99%   Weight:    122.7 kg (270 lb 8.1 oz)  Height:        Intake/Output Summary (Last 24 hours) at 01/07/2018 1139 Last data filed at 01/06/2018 1835 Gross per 24 hour  Intake 1352.18 ml  Output 1430 ml  Net -77.82 ml     Wt Readings from Last 3 Encounters:  01/07/18 122.7 kg (270 lb 8.1 oz)      Exam   General: Somewhat sleepy, easily arousable, oriented  Eyes:   HEENT:    Cardiovascular: S1 S2 auscultated,. Regular rate and rhythm. No pedal edema b/l  Respiratory: Clear to auscultation bilaterally, no wheezing, rales or rhonchi  Gastrointestinal: Soft, nontender, nondistended, + bowel sounds  Ext: no pedal edema bilaterally  Neuro: no new deficits  Musculoskeletal: No digital cyanosis, clubbing  Skin: I had examined the wound yesterday, stage III buttocks  Psych: somewhat sleepy but easily arousable  Data Reviewed:  I have personally reviewed following labs and imaging studies  Micro Results Recent Results (from the past 240 hour(s))  Blood Culture (routine x 2)     Status: None (Preliminary result)   Collection Time: 01/03/18  1:20 AM  Result Value Ref Range Status   Specimen Description BLOOD RIGHT HAND  Final   Special Requests   Final    BOTTLES DRAWN AEROBIC ONLY Blood Culture results may not be optimal due to an inadequate volume of blood received in culture bottles   Culture   Final    NO GROWTH 3 DAYS Performed at [facility], [place] [address], [place], [place] [address]    Report Status PENDING  Incomplete  Blood Culture (routine x 2)     Status: None (Preliminary result)   Collection Time: 01/03/18  1:35 AM  Result Value Ref Range Status   Specimen Description BLOOD RIGHT ARM  Final   Special Requests   Final    BOTTLES DRAWN AEROBIC AND ANAEROBIC Blood Culture results may not be optimal due to an excessive volume of blood received in culture bottles   Culture   Final    NO GROWTH 3 DAYS Performed at [facility], [place] [address], [place], [place] [address]    Report Status PENDING  Incomplete  Urine culture     Status: None   Collection Time: 01/03/18  2:38 AM  Result Value Ref Range Status   Specimen Description URINE, CATHETERIZED  Final  Special Requests NONE  Final   Culture   Final    NO GROWTH Performed at [facility], [place] [address] [place], [place] [address]    Report Status 01/04/2018 FINAL  Final  MRSA PCR Screening     Status: Abnormal   Collection Time: 01/03/18  4:53 AM  Result Value Ref Range Status   MRSA by PCR POSITIVE (A) NEGATIVE Final    Comment:        The GeneXpert MRSA Assay (FDA approved for NASAL specimens only), is one component of a comprehensive MRSA colonization surveillance program. It is not intended to diagnose MRSA infection nor to guide or monitor treatment for MRSA infections. RESULT CALLED TO, READ BACK BY AND VERIFIED WITH: A. Lewis RN 9:20 01/03/18 ([email or web address]) Performed at [facility], [place] [address], [place], [place] [address]   Aerobic Culture (superficial specimen)     Status: None   Collection Time: 01/03/18  4:53 AM  Result Value Ref Range Status   Specimen Description SKIN  Final   Special Requests NONE  Final   Gram Stain   Final    NO WBC SEEN NO ORGANISMS SEEN Performed at [facility], [place] [address] [place], [place] [address]    Culture   Final    FEW METHICILLIN RESISTANT STAPHYLOCOCCUS AUREUS RARE CANDIDA ALBICANS    Report Status 01/06/2018 FINAL  Final   Organism ID, Bacteria METHICILLIN RESISTANT STAPHYLOCOCCUS AUREUS  Final      Susceptibility   Methicillin resistant staphylococcus aureus - MIC*    CIPROFLOXACIN >=8 RESISTANT Resistant     ERYTHROMYCIN >=8 RESISTANT Resistant     GENTAMICIN <=0.5 SENSITIVE Sensitive     OXACILLIN >=4 RESISTANT Resistant     TETRACYCLINE <=1 SENSITIVE Sensitive     VANCOMYCIN <=0.5 SENSITIVE Sensitive     TRIMETH/SULFA <=10 SENSITIVE Sensitive     CLINDAMYCIN <=0.25 SENSITIVE Sensitive     RIFAMPIN <=0.5 SENSITIVE Sensitive     Inducible Clindamycin NEGATIVE Sensitive     * FEW METHICILLIN RESISTANT STAPHYLOCOCCUS AUREUS  C difficile quick scan w PCR reflex     Status: None   Collection Time: 01/06/18  4:44 AM  Result Value Ref Range Status   C Diff antigen NEGATIVE  NEGATIVE Final   C Diff toxin NEGATIVE NEGATIVE Final   C Diff interpretation No C. difficile detected.  Final    Comment: Performed at [facility], [place] [address], [place], [place] [address]    Radiology Reports Ct Head Wo Contrast  Result Date: 01/03/2018 CLINICAL DATA:  Altered mental status.  Sepsis. EXAM: CT HEAD WITHOUT CONTRAST TECHNIQUE: Contiguous axial images were obtained from the base of the skull through the vertex without intravenous contrast. COMPARISON:  Head CT - earlier same day; FINDINGS: Brain: Scattered periventricular hypodensities compatible with microvascular ischemic disease. Old lacunar infarct within the right cerebellar hemisphere (image 8, series 3). Gray-white differentiation is otherwise well maintained without CT evidence of superimposed acute large territory infarct. No intraparenchymal or extra-axial mass or hemorrhage. Grossly unchanged asymmetric prominence of the occipital horn of the left lateral ventricle. Unchanged size and configuration the ventricles and basilar cisterns. No midline shift. Vascular: Intracranial atherosclerosis. Skull: No displaced calvarial fracture. Sinuses/Orbits: Limited visualization of the paranasal sinuses and mastoid air cells is normal. No air-fluid levels. Other: Regional soft tissues appear normal. The patient is intubated. IMPRESSION: Similar findings of mild microvascular ischemic disease without superimposed acute intracranial process. Electronically  Signed   By: Sandi Mariscal M.D.   On: 01/03/2018 14:52   Ct Head Wo Contrast  Result Date: 01/03/2018 CLINICAL DATA:  Unresponsiveness. Last seen normal greater than 24 hours ago. EXAM: CT HEAD WITHOUT CONTRAST TECHNIQUE: Contiguous axial images were obtained from the base of the skull through the vertex without intravenous contrast. COMPARISON:  None. FINDINGS: Brain: Mild cerebral atrophy. No evidence of acute infarction, hemorrhage, hydrocephalus, extra-axial collection or mass  lesion/mass effect. Vascular: Intracranial arterial vascular calcifications are present. Skull: Normal. Negative for fracture or focal lesion. Sinuses/Orbits: No acute finding. Other: None. IMPRESSION: No acute intracranial abnormalities.  Mild cerebral atrophy. Electronically Signed   By: Lucienne Capers M.D.   On: 01/03/2018 01:57   Dg Chest Port 1 View  Result Date: 01/05/2018 CLINICAL DATA:  Status postextubation EXAM: PORTABLE CHEST 1 VIEW COMPARISON:  January 03, 2018 FINDINGS: Endotracheal tube and nasogastric tube have been removed. Central catheter tip is in the superior vena cava. No pneumothorax. There is mild atelectasis in the left mid lung. Lungs elsewhere are clear. Heart is mildly enlarged with pulmonary vascularity normal. No adenopathy. 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 There is postoperative change in the lower cervical spine. IMPRESSION: Central catheter tip in superior vena cava. No pneumothorax. Mild left midlung atelectasis. Lungs elsewhere clear. Stable cardiomegaly. Advanced arthropathy both shoulders. Electronically Signed   By: Lowella Grip III M.D.   On: 01/05/2018 07:14   Dg Chest Port 1 View  Result Date: 01/03/2018 CLINICAL DATA:  ET tube and OG tube inserted, check tip position microphone is at the side of my mouth EXAM: PORTABLE CHEST 1 VIEW COMPARISON:  01/03/2018 at 0054 hours FINDINGS: Endotracheal tube tip projects 1 cm above the [place], well positioned. Nasal/orogastric tube tip projects below the [place] in the expected location of the mid esophagus. It does not pass into the stomach. It will need to be further inserted at least 15-20 cm to allow the tip to fully into the stomach. New left subclavian central venous line tip projects just above the caval atrial junction, well positioned. No pneumothorax. There is opacity in the medial upper lobes extending above both hila and at the left lung base, most likely atelectasis accentuated by low lung  volumes. IMPRESSION: 1. Endotracheal tube and left subclavian central venous line are well positioned. 2. Nasogastric tube tip projects in the mid esophagus. Recommend further inserting 15-20 cm. 3. No pneumothorax. No change in lung aeration since the previous study. Electronically Signed   By: Lajean Manes M.D.   On: 01/03/2018 11:08   Dg Chest Portable 1 View  Result Date: 01/03/2018 CLINICAL DATA:  Patient is unresponsive. EXAM: PORTABLE CHEST 1 VIEW COMPARISON:  None. FINDINGS: Shallow inspiration. Normal heart size and pulmonary vascularity. No focal airspace disease or consolidation in the lungs. No blunting of costophrenic angles. No pneumothorax. Mediastinal contours appear intact. Prominent degenerative changes in the shoulders. IMPRESSION: Shallow inspiration.  No evidence of active pulmonary disease. Electronically Signed   By: Lucienne Capers M.D.   On: 01/03/2018 01:17   Dg Abd Portable 1v  Result Date: 01/03/2018 CLINICAL DATA:  NG tube placement. EXAM: PORTABLE ABDOMEN - 1 VIEW COMPARISON:  01/03/2018 at 10:46 a.m. FINDINGS: Nasogastric tube passes below the diaphragm, tip projecting in the gastric fundus. No other change since the prior exam. Moderate increased stool throughout the colon consistent with constipation. IMPRESSION: NG tube well positioned within the stomach. Electronically Signed   By: Dedra Skeens.D.  On: 01/03/2018 11:14   Dg Abd Portable 1v  Result Date: 01/03/2018 CLINICAL DATA:  Check gastric catheter placement EXAM: PORTABLE ABDOMEN - 1 VIEW COMPARISON:  None. FINDINGS: Scattered large and small bowel gas is noted. Fecal material is noted throughout the colon consistent with a degree of constipation. No abnormal mass or abnormal calcifications are noted. Inserted gastric catheter is not visualized on this film. IMPRESSION: Known inserted gastric catheter is not visualized on this film. Changes consistent with constipation. Electronically Signed   By: Inez Catalina  M.D.   On: 01/03/2018 11:05    Lab Data:  CBC: Recent Labs  Lab 01/03/18 0126 01/03/18 0615 01/04/18 0519 01/05/18 0232 01/06/18 0923 01/07/18 0429  WBC 16.1* 13.4* 11.5* 12.6* 10.6* 12.4*  NEUTROABS 11.9*  --   --   --   --   --   HGB 11.9* 10.6* 11.0* 9.8* 9.0* 9.3*  HCT 38.5 35.1* 35.1* 31.7* 28.1* 29.7*  MCV 89.5 91.2 87.1 89.3 86.2 86.1  PLT 278 243 246 234 202 993   Basic Metabolic Panel: Recent Labs  Lab 01/03/18 0615 01/04/18 0519 01/05/18 0450 01/05/18 1937 01/06/18 0409 01/07/18 0429  NA 137 138 138 135 135 134*  K 4.2 3.2* 2.6* 3.0* 2.8* 2.6*  CL 105 109 106 102 101 99  CO2 20* 20* 25 25 26 28   GLUCOSE 245* 382* 98 226* 183* 203*  BUN 59* 36* 16 11 9  6*  CREATININE 3.99* 1.86* 0.95 0.86 0.79 0.79  CALCIUM 7.6* 6.5* 6.7* 7.1* 7.3* 7.5*  MG 1.8 1.7 1.8  --   --  1.6*  PHOS 4.0 1.5* 1.2*  --  1.5*  --    GFR: Estimated Creatinine Clearance: 96.2 mL/min (by C-G formula based on SCr of 0.79 mg/dL). Liver Function Tests: Recent Labs  Lab 01/03/18 0126 01/03/18 0615  AST 80*  --   ALT 38  --   ALKPHOS 78  --   BILITOT 0.7  --   PROT 6.7  --   ALBUMIN 2.6* 2.1*   No results for input(s): LIPASE, AMYLASE in the last 168 hours. No results for input(s): AMMONIA in the last 168 hours. Coagulation Profile: No results for input(s): INR, PROTIME in the last 168 hours. Cardiac Enzymes: Recent Labs  Lab 01/03/18 0119  TROPONINI 0.04*   BNP (last 3 results) No results for input(s): PROBNP in the last 8760 hours. HbA1C: No results for input(s): HGBA1C in the last 72 hours. CBG: Recent Labs  Lab 01/05/18 2241 01/06/18 0800 01/06/18 1117 01/06/18 1625 01/07/18 0817  GLUCAP 211* 141* 165* 190* 166*   Lipid Profile: No results for input(s): CHOL, HDL, LDLCALC, TRIG, CHOLHDL, LDLDIRECT in the last 72 hours. Thyroid Function Tests: No results for input(s): TSH, T4TOTAL, FREET4, T3FREE, THYROIDAB in the last 72 hours. Anemia Panel: No results for  input(s): VITAMINB12, FOLATE, FERRITIN, TIBC, IRON, RETICCTPCT in the last 72 hours. Urine analysis:    Component Value Date/Time   COLORURINE AMBER (A) 01/03/2018 0237   APPEARANCEUR HAZY (A) 01/03/2018 0237   LABSPEC 1.019 01/03/2018 0237   PHURINE 5.0 01/03/2018 0237   GLUCOSEU 50 (A) 01/03/2018 0237   HGBUR SMALL (A) 01/03/2018 0237   BILIRUBINUR MODERATE (A) 01/03/2018 0237   KETONESUR NEGATIVE 01/03/2018 0237   PROTEINUR NEGATIVE 01/03/2018 0237   NITRITE NEGATIVE 01/03/2018 0237   LEUKOCYTESUR NEGATIVE 01/03/2018 0237     Natalie Macdonald M.D. [facility] 01/07/2018, 11:39 AM  Pager: [phone] Between 7am to 7pm - call  Pager - [phone]  After 7pm go to [email or web address] - password [email or web address]  Call night coverage person covering after 7pm

## 2018-01-07 NOTE — Progress Notes (Signed)
CSW received consult regarding PT recommendation of SNF at discharge.  Patient and spouse are adamantly refusing SNF. RNCM aware.   CSW signing off.   Natalie Locus Nikolai Wilczak LCSW [phone]

## 2018-01-07 NOTE — Progress Notes (Addendum)
Inpatient Diabetes Program Recommendations  AACE/ADA: New Consensus Statement on Inpatient Glycemic Control (2019)  Target Ranges:  Prepandial:   less than 140 mg/dL      Peak postprandial:   less than 180 mg/dL (1-2 hours)      Critically ill patients:  140 - 180 mg/dL   Results for JALAYSHA, SKILTON (MRN [id number]) as of 01/07/2018 08:54  Ref. Range 01/06/2018 08:00 01/06/2018 11:17 01/06/2018 16:25 01/07/2018 08:17  Glucose-Capillary Latest Ref Range: 70 - 99 mg/dL 141 (H) 165 (H) 190 (H) 166 (H)  Results for HAYDON, KALMAR (MRN [id number]) as of 01/07/2018 08:54  Ref. Range 01/03/2018 01:26  Hemoglobin A1C Latest Ref Range: 4.8 - 5.6 % 9.0 (H)   Review of Glycemic Control  Diabetes history: DM2 Outpatient Diabetes medications: Apidra 18-25 units TID with meals, Toujeo 40-50 units QHS  Current orders for Inpatient glycemic control: Lantus 20 units QHS  Inpatient Diabetes Program Recommendations:  Diet: Added Carb Modified to Soft diet.  A1C: A1C 9.0% on 01/03/18 indicating an average glucose of 212 mg/dl. Patient needs to follow up with PCP regarding improving DM control.   Correction (SSI): Noted patient has been given Regular insulin while inpatient and per chart patients family has refused to bring patient's outpatient insulins for inpatient use. Please consider ordering Regular 0-9 units TID with meals and Regular 0-5 units QHS. Regular 0-9 Units, Subcutaneous, 3 times daily with meals Correction coverage: Sensitive (thin, NPO, renal) CBG < 70: implement hypoglycemia protocol CBG 70 - 120: 0 units CBG 121 - 150: 1 unit CBG 151 - 200: 2 units CBG 201 - 250: 3 units CBG 251 - 300: 5 units CBG 301 - 350: 7 units CBG 351 - 400: 9 units CBG > 400: call MD and obtain STAT lab verification  Regular 0-5 Units, Subcutaneous, Daily at bedtime Correction coverage: HS scale CBG < 70: implement hypoglycemia protocol CBG 70 - 120: 0 units CBG 121 - 150: 0 units CBG 151 - 200: 0 units CBG 201 -  250: 2 units CBG 251 - 300: 3 units CBG 301 - 350: 4 units CBG 351 - 400: 5 units CBG > 400: call MD and obtain STAT lab verification  Addendum 01/07/18@14 :52-Spoke with patient and her daughter about diabetes and home regimen for diabetes control. Patient reports that she is followed by PCP for diabetes management and currently she takes Apidra 18-25 units TID with meals, Toujeo 40-50 units QHS as an outpatient for diabetes control. Patient reports that she is taking insulin as prescribed and that she checked glucose 2-3 times per day and that it is usually in 70-100's mg/dl in the morning and goes up but about 150 mg/dl by the afternoon. Patient reports that when she received Novolog in the past she had a site reaction. Discussed current orders for inpatient glycemic control. Patient has not noted any site reactions to Lantus and Regular insulin.  Inquired about prior A1C and patient reports that her last A1C was 7.1% 2 weeks ago. Discussed A1C results (9% on 01/03/18) and explained that her current A1C indicates an average glucose of 212 mg/dl over the past 2-3 months. Patient reports that she received 2 steroid injections about 3 weeks ago in her shoulder. Explained that steroid injections contributed to elevated A1C. Reviewed glucose and A1C goals.  Discussed impact of nutrition, exercise, stress, sickness, and medications on diabetes control. Patient verbalized understanding of information discussed and she states that she has no further questions at this time related to  diabetes.  Thanks, Barnie Alderman, RN, MSN, CDE Diabetes Coordinator Inpatient Diabetes Program [phone] (Team Pager from 8am to 5pm)

## 2018-01-08 LAB — BASIC METABOLIC PANEL
Anion gap: 8 (ref 5–15)
CO2: 29 mmol/L (ref 22–32)
CREATININE: 0.75 mg/dL (ref 0.44–1.00)
Calcium: 7.8 mg/dL — ABNORMAL LOW (ref 8.9–10.3)
Chloride: 98 mmol/L (ref 98–111)
Glucose, Bld: 153 mg/dL — ABNORMAL HIGH (ref 70–99)
Potassium: 3.1 mmol/L — ABNORMAL LOW (ref 3.5–5.1)
SODIUM: 135 mmol/L (ref 135–145)

## 2018-01-08 LAB — CULTURE, BLOOD (ROUTINE X 2)
CULTURE: NO GROWTH
Culture: NO GROWTH

## 2018-01-08 LAB — CBC
HCT: 30.9 % — ABNORMAL LOW (ref 36.0–46.0)
Hemoglobin: 9.8 g/dL — ABNORMAL LOW (ref 12.0–15.0)
MCH: 27.6 pg (ref 26.0–34.0)
MCHC: 31.7 g/dL (ref 30.0–36.0)
MCV: 87 fL (ref 78.0–100.0)
PLATELETS: 245 10*3/uL (ref 150–400)
RBC: 3.55 MIL/uL — ABNORMAL LOW (ref 3.87–5.11)
RDW: 13.2 % (ref 11.5–15.5)
WBC: 14.8 10*3/uL — AB (ref 4.0–10.5)

## 2018-01-08 LAB — GLUCOSE, CAPILLARY
GLUCOSE-CAPILLARY: 125 mg/dL — AB (ref 70–99)
GLUCOSE-CAPILLARY: 129 mg/dL — AB (ref 70–99)
Glucose-Capillary: 125 mg/dL — ABNORMAL HIGH (ref 70–99)
Glucose-Capillary: 115 mg/dL — ABNORMAL HIGH (ref 70–99)

## 2018-01-08 MED ORDER — INSULIN GLARGINE 100 UNIT/ML ~~LOC~~ SOLN
23.0000 [IU] | Freq: Every day | SUBCUTANEOUS | Status: DC
  Administered 2018-01-08 – 2018-01-09 (×2): 23 [IU] via SUBCUTANEOUS
  Filled 2018-01-08 (×4): qty 0.23

## 2018-01-08 MED ORDER — ACETAMINOPHEN 160 MG/5ML PO SOLN
650.0000 mg | Freq: Four times a day (QID) | ORAL | Status: DC | PRN
  Administered 2018-01-08: 650 mg via ORAL
  Filled 2018-01-08 (×3): qty 20.3

## 2018-01-08 NOTE — Progress Notes (Signed)
Inpatient Diabetes Program Recommendations  AACE/ADA: New Consensus Statement on Inpatient Glycemic Control (2019)  Target Ranges:  Prepandial:   less than 140 mg/dL      Peak postprandial:   less than 180 mg/dL (1-2 hours)      Critically ill patients:  140 - 180 mg/dL   Results for [name], [name] (MRN [id number]) as of 01/08/2018 10:33  Ref. Range 01/07/2018 08:17 01/07/2018 12:29 01/07/2018 16:46 01/07/2018 21:31 01/08/2018 08:13  Glucose-Capillary Latest Ref Range: 70 - 99 mg/dL 166 (H) 173 (H) 227 (H) 182 (H) 125 (H)   Review of Glycemic Control  Diabetes history: DM2 Outpatient Diabetes medications: Apidra 18-25 units TID with meals, Toujeo 40-50 units QHS  Current orders for Inpatient glycemic control: Lantus 20 units QHS, Regular 0-9 units TID with meals (correction scale)  Inpatient Diabetes Program Recommendations: Insulin-Meal Coverage: Please consider ordering Regular 3 units TID with meals for meal coverage if patient eats at least 50% of meals.  Thanks, Barnie Alderman, RN, MSN, CDE Diabetes Coordinator Inpatient Diabetes Program [phone] (Team Pager from 8am to 5pm)

## 2018-01-08 NOTE — Progress Notes (Signed)
[facility]                                                                              Patient Demographics  Natalie Macdonald, is a 62 y.o. female, DOB - [birth year], EVO:[id number]  Admit date - 01/03/2018   Admitting Physician Renee Pain, MD  Outpatient Primary MD for the patient is Clifton Custard, MD  Outpatient specialists:   LOS - 5  days   Medical records reviewed and are as summarized below:    Chief Complaint  Patient presents with  . Altered Mental Status       Brief summary   Patient is a 62 year old female with history of diabetes, hyperlipidemia, hypertension, hypothyroidism, sarcoidosis, previous ACDF 2018 with residual vocal cord paralysis, left hemiparesis presented to ED with altered mental status.  Patient has been mostly wheelchair-bound since cervical surgery in 2018.  Daughter reported normal state of health on 6/1 and on 6/3 patient was found to be somnolent with intermittent episodes of consciousness. In ED, febrile 102.7, tachycardia, hypotensive, hypoxic with decreased responsiveness.  WBC count 16.1, creatinine 4.73.  Chest x-ray showed possible right upper lung infiltrate.  Due to sepsis with shock, patient was admitted by critical care to the ICU. Patient was transferred to hospitalist service on 01/06/2018  Assessment & Plan    Principal Problem:   Sepsis ([place]) with septic shock, CAP, sacral decub ulcer -Sepsis physiology now resolved, patient required vasopressors at the time of admission, admitted to critical care -Patient was intubated on 01/03/2018, and extubated on 01/04/2018 -Blood cultures negative till date, urine cultures negative -Wound culture showed MRSA, currently on IV vancomycin and Zosyn, will likely transition to oral doxycycline for 2 weeks at discharge with wound care. -Leukocytosis with white count trending up 14.8 today -ID was consulted, patient was seen by Dr. Megan Salon, reviewed note and recommendations,  patient and husband refused Dr. Megan Salon to examine the patient.   Active Problems: Acute respiratory failure with hypoxia -Patient was intubated on 01/03/2018 and extubated on 7/5  -O2 sats 98% on room air    Sacral decubitus ulcer, stage III ([place]), present prior to the admission - Wound culture with MRSA, currently on IV vancomycin and Zosyn-  -Wound care following, follow recommendations    AKI (acute kidney injury) ([place]) -At the time of admission, patient presented with creatinine of 3.99 likely due to #1, currently resolved with IV fluid hydration -Creatinine resolved 0.7  Hypokalemia, hypophosphatemia -Potassium 3.1, continue daily replacement - continue phosphorus replacement  Hypertension, hyperlipidemia  -BP stable, continue Lopressor, labetalol as needed     Type 2 diabetes mellitus ([place]) -Patient reports allergy to NovoLog and does not want Humulin. -Will increase Lantus to 23 units at bedtime, husband declined to bring Apidra from home - placed on regular insulin per sliding scale as recommended by the diabetic coordinator    Acute metabolic encephalopathy -Likely due to #1, dehydration and hypovolemia -Currently mental status improving, close to baseline    Diarrhea -C. difficile negative, diarrhea improving, loperamide as needed  History of CVA with left-sided hemiparesis - No new acute issues  Morbid obesity, BMI of 43 Patient  was counseled on diet and weight control  Chronic pain syndrome -Patient and husband requesting her home pain medications to be started - Reviewed admitting note, patient had received Narcan 0.1 mg IV x2 at the time of admission withthe significant improvement in the mental status - For now I have restarted MS Contin at patient's home dose, no oxycodone or Neurontin. - Placed on Flexeril  mg every 8 hours as needed, low-dose fentanyl IV every 6 hours as needed for breakthrough more severe pain  Generalized debility -PT evaluation  recommended skilled nursing facility, patient and husband declined  Code Status: Full CODE STATUS DVT Prophylaxis: Heparin subcu Family Communication: Discussed in detail with the patient, all imaging results, lab results explained to the patient.  Discussed with husband on 7/8.   Disposition Plan: Plan to DC home with home health services tomorrow if no acute issues overnight, no fevers chills and WBC trending down  Time Spent in minutes 25 minutes  Procedures:  Intubation, extubation   Consultants:   Patient was admitted by CCM Infectious disease Wound care  Antimicrobials:      Medications  Scheduled Meds: . chlorhexidine  15 mL Mouth Rinse BID  . DULoxetine  60 mg Oral Daily  . famotidine  20 mg Oral QHS  . feeding supplement (ENSURE ENLIVE)  237 mL Oral BID BM  . heparin  5,000 Units Subcutaneous Q8H  . insulin glargine  20 Units Subcutaneous QHS  . insulin regular  0-9 Units Subcutaneous TID AC  . levothyroxine  150 mcg Oral QAC breakfast  . mouth rinse  15 mL Mouth Rinse q12n4p  . morphine  30 mg Oral BID  . multivitamin with minerals  1 tablet Oral Daily  . oxybutynin  15 mg Oral Daily  . phosphorus  500 mg Oral BID  . potassium chloride  40 mEq Oral Daily  . vitamin B-6  250 mg Oral Daily  . sodium chloride flush  10-40 mL Intracatheter Q12H  . vitamin B-12  1,000 mcg Oral Daily   Continuous Infusions: . sodium chloride 50 mL/hr at 01/08/18 0428  . piperacillin-tazobactam (ZOSYN)  IV 3.375 g (01/08/18 0430)  . vancomycin 1,000 mg (01/08/18 0429)   PRN Meds:.acetaminophen (TYLENOL) oral liquid 160 mg/5 mL, acetaminophen, albuterol, fentaNYL (SUBLIMAZE) injection, fluticasone, labetalol, loperamide, ondansetron (ZOFRAN) IV, phenol, tiZANidine   Antibiotics   Anti-infectives (From admission, onward)   Start     Dose/Rate Route Frequency Ordered Stop   01/06/18 1200  vancomycin (VANCOCIN) IVPB 1000 mg/200 mL premix     1,000 mg 200 mL/hr over 60 Minutes  Intravenous Every 12 hours 01/06/18 1131     01/05/18 1200  vancomycin (VANCOCIN) IVPB 750 mg/150 ml premix  Status:  Discontinued     750 mg 150 mL/hr over 60 Minutes Intravenous Every 12 hours 01/05/18 1111 01/06/18 1131   01/05/18 0200  vancomycin (VANCOCIN) 1,500 mg in sodium chloride 0.9 % 500 mL IVPB  Status:  Discontinued     1,500 mg 250 mL/hr over 120 Minutes Intravenous Every 48 hours 01/04/18 1220 01/05/18 1111   01/04/18 1300  piperacillin-tazobactam (ZOSYN) IVPB 3.375 g     3.375 g 12.5 mL/hr over 240 Minutes Intravenous Every 8 hours 01/04/18 1231     01/03/18 1100  vancomycin (VANCOCIN) IVPB 1000 mg/200 mL premix     1,000 mg 200 mL/hr over 60 Minutes Intravenous  Once 01/03/18 1028 01/03/18 1216   01/03/18 1100  piperacillin-tazobactam (ZOSYN) IVPB 2.25 g  Status:  Discontinued     2.25 g 100 mL/hr over 30 Minutes Intravenous Every 6 hours 01/03/18 1028 01/04/18 1231   01/03/18 0100  vancomycin (VANCOCIN) IVPB 1000 mg/200 mL premix     1,000 mg 200 mL/hr over 60 Minutes Intravenous  Once 01/03/18 0056 01/03/18 0237   01/03/18 0100  piperacillin-tazobactam (ZOSYN) IVPB 3.375 g     3.375 g 12.5 mL/hr over 240 Minutes Intravenous  Once 01/03/18 0056 01/03/18 0158        Subjective:   Natalie Macdonald was seen and examined today.  No complaints per patient today, low-grade temp of 99.4 F.  No chest pain, shortness of breath nausea or vomiting.    Objective:   Vitals:   01/07/18 1507 01/07/18 2136 01/08/18 0424 01/08/18 0449  BP: (!) 149/83 (!) 153/94 (!) 143/76   Pulse: 81 85 93   Resp: 18 18 20    Temp: 97.6 F (36.4 C) 99.4 F (37.4 C) 98.3 F (36.8 C)   TempSrc:  Oral Oral   SpO2: 99% 100% 98%   Weight:    116.8 kg (257 lb 8 oz)  Height:        Intake/Output Summary (Last 24 hours) at 01/08/2018 1212 Last data filed at 01/08/2018 0900 Gross per 24 hour  Intake 609.93 ml  Output 2700 ml  Net -2090.07 ml     Wt Readings from Last 3 Encounters:  01/08/18  116.8 kg (257 lb 8 oz)     Exam   General: Alert and oriented x 3, NAD  Eyes:   HEENT:    Cardiovascular: S1 S2 auscultated, RRR no pedal edema b/l  Respiratory: Clear to auscultation bilaterally, no wheezing, rales or rhonchi  Gastrointestinal: Soft, nontender, nondistended, + bowel sounds  Ext: no pedal edema bilaterally  Neuro: left-sided hemiparesis, old  Musculoskeletal: No digital cyanosis, clubbing  Skin: Dressing intact, had examined the wound on 7/7, grade 3  Psych: Normal affect and demeanor, alert and oriented x3    Data Reviewed:  I have personally reviewed following labs and imaging studies  Micro Results Recent Results (from the past 240 hour(s))  Blood Culture (routine x 2)     Status: None (Preliminary result)   Collection Time: 01/03/18  1:20 AM  Result Value Ref Range Status   Specimen Description BLOOD RIGHT HAND  Final   Special Requests   Final    BOTTLES DRAWN AEROBIC ONLY Blood Culture results may not be optimal due to an inadequate volume of blood received in culture bottles   Culture   Final    NO GROWTH 4 DAYS Performed at [facility], [place] [address], [place], [place] [address]    Report Status PENDING  Incomplete  Blood Culture (routine x 2)     Status: None (Preliminary result)   Collection Time: 01/03/18  1:35 AM  Result Value Ref Range Status   Specimen Description BLOOD RIGHT ARM  Final   Special Requests   Final    BOTTLES DRAWN AEROBIC AND ANAEROBIC Blood Culture results may not be optimal due to an excessive volume of blood received in culture bottles   Culture   Final    NO GROWTH 4 DAYS Performed at [facility], [place] [address], [place], [place] [address]    Report Status PENDING  Incomplete  Urine culture     Status: None   Collection Time: 01/03/18  2:38 AM  Result Value Ref Range Status   Specimen Description URINE, CATHETERIZED  Final   Special Requests NONE  Final   Culture   Final    NO  GROWTH Performed at [facility], [place] [address], [place], [place] [address]    Report Status 01/04/2018 FINAL  Final  MRSA PCR Screening     Status: Abnormal   Collection Time: 01/03/18  4:53 AM  Result Value Ref Range Status   MRSA by PCR POSITIVE (A) NEGATIVE Final    Comment:        The GeneXpert MRSA Assay (FDA approved for NASAL specimens only), is one component of a comprehensive MRSA colonization surveillance program. It is not intended to diagnose MRSA infection nor to guide or monitor treatment for MRSA infections. RESULT CALLED TO, READ BACK BY AND VERIFIED WITH: A. Lewis RN 9:20 01/03/18 ([email or web address]) Performed at [facility], [place] [address], [place], [place] [address]   Aerobic Culture (superficial specimen)     Status: None   Collection Time: 01/03/18  4:53 AM  Result Value Ref Range Status   Specimen Description SKIN  Final   Special Requests NONE  Final   Gram Stain   Final    NO WBC SEEN NO ORGANISMS SEEN Performed at [facility], [place] [address] [place], [place] [address]    Culture   Final    FEW METHICILLIN RESISTANT STAPHYLOCOCCUS AUREUS RARE CANDIDA ALBICANS    Report Status 01/06/2018 FINAL  Final   Organism ID, Bacteria METHICILLIN RESISTANT STAPHYLOCOCCUS AUREUS  Final      Susceptibility   Methicillin resistant staphylococcus aureus - MIC*    CIPROFLOXACIN >=8 RESISTANT Resistant     ERYTHROMYCIN >=8 RESISTANT Resistant     GENTAMICIN <=0.5 SENSITIVE Sensitive     OXACILLIN >=4 RESISTANT Resistant     TETRACYCLINE <=1 SENSITIVE Sensitive     VANCOMYCIN <=0.5 SENSITIVE Sensitive     TRIMETH/SULFA <=10 SENSITIVE Sensitive     CLINDAMYCIN <=0.25 SENSITIVE Sensitive     RIFAMPIN <=0.5 SENSITIVE Sensitive     Inducible Clindamycin NEGATIVE Sensitive     * FEW METHICILLIN RESISTANT STAPHYLOCOCCUS AUREUS  C difficile quick scan w PCR reflex     Status: None   Collection Time: 01/06/18  4:44 AM  Result Value Ref Range Status   C  Diff antigen NEGATIVE NEGATIVE Final   C Diff toxin NEGATIVE NEGATIVE Final   C Diff interpretation No C. difficile detected.  Final    Comment: Performed at [facility], [place] [address], [place], [place] [address]    Radiology Reports Ct Head Wo Contrast  Result Date: 01/03/2018 CLINICAL DATA:  Altered mental status.  Sepsis. EXAM: CT HEAD WITHOUT CONTRAST TECHNIQUE: Contiguous axial images were obtained from the base of the skull through the vertex without intravenous contrast. COMPARISON:  Head CT - earlier same day; FINDINGS: Brain: Scattered periventricular hypodensities compatible with microvascular ischemic disease. Old lacunar infarct within the right cerebellar hemisphere (image 8, series 3). Gray-white differentiation is otherwise well maintained without CT evidence of superimposed acute large territory infarct. No intraparenchymal or extra-axial mass or hemorrhage. Grossly unchanged asymmetric prominence of the occipital horn of the left lateral ventricle. Unchanged size and configuration the ventricles and basilar cisterns. No midline shift. Vascular: Intracranial atherosclerosis. Skull: No displaced calvarial fracture. Sinuses/Orbits: Limited visualization of the paranasal sinuses and mastoid air cells is normal. No air-fluid levels. Other: Regional soft tissues appear normal. The patient is intubated. IMPRESSION: Similar findings of mild microvascular ischemic disease without superimposed acute  intracranial process. Electronically Signed   By: Sandi Mariscal M.D.   On: 01/03/2018 14:52   Ct Head Wo Contrast  Result Date: 01/03/2018 CLINICAL DATA:  Unresponsiveness. Last seen normal greater than 24 hours ago. EXAM: CT HEAD WITHOUT CONTRAST TECHNIQUE: Contiguous axial images were obtained from the base of the skull through the vertex without intravenous contrast. COMPARISON:  None. FINDINGS: Brain: Mild cerebral atrophy. No evidence of acute infarction, hemorrhage, hydrocephalus,  extra-axial collection or mass lesion/mass effect. Vascular: Intracranial arterial vascular calcifications are present. Skull: Normal. Negative for fracture or focal lesion. Sinuses/Orbits: No acute finding. Other: None. IMPRESSION: No acute intracranial abnormalities.  Mild cerebral atrophy. Electronically Signed   By: Lucienne Capers M.D.   On: 01/03/2018 01:57   Dg Chest Port 1 View  Result Date: 01/05/2018 CLINICAL DATA:  Status postextubation EXAM: PORTABLE CHEST 1 VIEW COMPARISON:  January 03, 2018 FINDINGS: Endotracheal tube and nasogastric tube have been removed. Central catheter tip is in the superior vena cava. No pneumothorax. There is mild atelectasis in the left mid lung. Lungs elsewhere are clear. Heart is mildly enlarged with pulmonary vascularity normal. No adenopathy. 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 There is postoperative change in the lower cervical spine. IMPRESSION: Central catheter tip in superior vena cava. No pneumothorax. Mild left midlung atelectasis. Lungs elsewhere clear. Stable cardiomegaly. Advanced arthropathy both shoulders. Electronically Signed   By: Lowella Grip III M.D.   On: 01/05/2018 07:14   Dg Chest Port 1 View  Result Date: 01/03/2018 CLINICAL DATA:  ET tube and OG tube inserted, check tip position microphone is at the side of my mouth EXAM: PORTABLE CHEST 1 VIEW COMPARISON:  01/03/2018 at 0054 hours FINDINGS: Endotracheal tube tip projects 1 cm above the [place], well positioned. Nasal/orogastric tube tip projects below the [place] in the expected location of the mid esophagus. It does not pass into the stomach. It will need to be further inserted at least 15-20 cm to allow the tip to fully into the stomach. New left subclavian central venous line tip projects just above the caval atrial junction, well positioned. No pneumothorax. There is opacity in the medial upper lobes extending above both hila and at the left lung base, most likely  atelectasis accentuated by low lung volumes. IMPRESSION: 1. Endotracheal tube and left subclavian central venous line are well positioned. 2. Nasogastric tube tip projects in the mid esophagus. Recommend further inserting 15-20 cm. 3. No pneumothorax. No change in lung aeration since the previous study. Electronically Signed   By: Lajean Manes M.D.   On: 01/03/2018 11:08   Dg Chest Portable 1 View  Result Date: 01/03/2018 CLINICAL DATA:  Patient is unresponsive. EXAM: PORTABLE CHEST 1 VIEW COMPARISON:  None. FINDINGS: Shallow inspiration. Normal heart size and pulmonary vascularity. No focal airspace disease or consolidation in the lungs. No blunting of costophrenic angles. No pneumothorax. Mediastinal contours appear intact. Prominent degenerative changes in the shoulders. IMPRESSION: Shallow inspiration.  No evidence of active pulmonary disease. Electronically Signed   By: Lucienne Capers M.D.   On: 01/03/2018 01:17   Dg Abd Portable 1v  Result Date: 01/03/2018 CLINICAL DATA:  NG tube placement. EXAM: PORTABLE ABDOMEN - 1 VIEW COMPARISON:  01/03/2018 at 10:46 a.m. FINDINGS: Nasogastric tube passes below the diaphragm, tip projecting in the gastric fundus. No other change since the prior exam. Moderate increased stool throughout the colon consistent with constipation. IMPRESSION: NG tube well positioned within the stomach. Electronically Signed   By: Shanon Brow  Ormond M.D.   On: 01/03/2018 11:14   Dg Abd Portable 1v  Result Date: 01/03/2018 CLINICAL DATA:  Check gastric catheter placement EXAM: PORTABLE ABDOMEN - 1 VIEW COMPARISON:  None. FINDINGS: Scattered large and small bowel gas is noted. Fecal material is noted throughout the colon consistent with a degree of constipation. No abnormal mass or abnormal calcifications are noted. Inserted gastric catheter is not visualized on this film. IMPRESSION: Known inserted gastric catheter is not visualized on this film. Changes consistent with constipation.  Electronically Signed   By: Inez Catalina M.D.   On: 01/03/2018 11:05    Lab Data:  CBC: Recent Labs  Lab 01/03/18 0126  01/04/18 0519 01/05/18 0232 01/06/18 0923 01/07/18 0429 01/08/18 0500  WBC 16.1*   < > 11.5* 12.6* 10.6* 12.4* 14.8*  NEUTROABS 11.9*  --   --   --   --   --   --   HGB 11.9*   < > 11.0* 9.8* 9.0* 9.3* 9.8*  HCT 38.5   < > 35.1* 31.7* 28.1* 29.7* 30.9*  MCV 89.5   < > 87.1 89.3 86.2 86.1 87.0  PLT 278   < > 246 234 202 223 245   < > = values in this interval not displayed.   Basic Metabolic Panel: Recent Labs  Lab 01/03/18 0615 01/04/18 0519 01/05/18 0450 01/05/18 1937 01/06/18 0409 01/07/18 0429 01/07/18 1230 01/08/18 0500  NA 137 138 138 135 135 134*  --  135  K 4.2 3.2* 2.6* 3.0* 2.8* 2.6* 3.0* 3.1*  CL 105 109 106 102 101 99  --  98  CO2 20* 20* 25 25 26 28   --  29  GLUCOSE 245* 382* 98 226* 183* 203*  --  153*  BUN 59* 36* 16 11 9  6*  --  <5*  CREATININE 3.99* 1.86* 0.95 0.86 0.79 0.79  --  0.75  CALCIUM 7.6* 6.5* 6.7* 7.1* 7.3* 7.5*  --  7.8*  MG 1.8 1.7 1.8  --   --  1.6*  --   --   PHOS 4.0 1.5* 1.2*  --  1.5*  --   --   --    GFR: Estimated Creatinine Clearance: 93.5 mL/min (by C-G formula based on SCr of 0.75 mg/dL). Liver Function Tests: Recent Labs  Lab 01/03/18 0126 01/03/18 0615  AST 80*  --   ALT 38  --   ALKPHOS 78  --   BILITOT 0.7  --   PROT 6.7  --   ALBUMIN 2.6* 2.1*   No results for input(s): LIPASE, AMYLASE in the last 168 hours. No results for input(s): AMMONIA in the last 168 hours. Coagulation Profile: No results for input(s): INR, PROTIME in the last 168 hours. Cardiac Enzymes: Recent Labs  Lab 01/03/18 0119  TROPONINI 0.04*   BNP (last 3 results) No results for input(s): PROBNP in the last 8760 hours. HbA1C: No results for input(s): HGBA1C in the last 72 hours. CBG: Recent Labs  Lab 01/07/18 0817 01/07/18 1229 01/07/18 1646 01/07/18 2131 01/08/18 0813  GLUCAP 166* 173* 227* 182* 125*   Lipid  Profile: No results for input(s): CHOL, HDL, LDLCALC, TRIG, CHOLHDL, LDLDIRECT in the last 72 hours. Thyroid Function Tests: No results for input(s): TSH, T4TOTAL, FREET4, T3FREE, THYROIDAB in the last 72 hours. Anemia Panel: No results for input(s): VITAMINB12, FOLATE, FERRITIN, TIBC, IRON, RETICCTPCT in the last 72 hours. Urine analysis:    Component Value Date/Time   COLORURINE AMBER (A) 01/03/2018 2297  APPEARANCEUR HAZY (A) 01/03/2018 0237   LABSPEC 1.019 01/03/2018 0237   PHURINE 5.0 01/03/2018 0237   GLUCOSEU 50 (A) 01/03/2018 0237   HGBUR SMALL (A) 01/03/2018 0237   BILIRUBINUR MODERATE (A) 01/03/2018 0237   KETONESUR NEGATIVE 01/03/2018 0237   PROTEINUR NEGATIVE 01/03/2018 0237   NITRITE NEGATIVE 01/03/2018 0237   LEUKOCYTESUR NEGATIVE 01/03/2018 0237     Imer Foxworth M.D. [facility] 01/08/2018, 12:12 PM  Pager: [phone] Between 7am to 7pm - call Pager - [phone]  After 7pm go to [email or web address] - password [email or web address]  Call night coverage person covering after 7pm

## 2018-01-08 NOTE — Progress Notes (Signed)
PT Cancellation Note  Patient Details Name: Quinley Nesler MRN: [id number] DOB: [birth year]   Cancelled Treatment:    Reason Eval/Treat Not Completed: Patient declined, no reason specified PT attempting to see patient to progress functional mobility. Patient refusing, stating she had a "migraine." Offered to assist with repositioning in bed for pressure relief with patient continuing to refuse PT. Will follow.   Lanney Gins, PT, DPT 01/08/18 1:49 PM Pager: [phone]

## 2018-01-09 LAB — BASIC METABOLIC PANEL
Anion gap: 9 (ref 5–15)
BUN: 5 mg/dL — ABNORMAL LOW (ref 8–23)
CHLORIDE: 97 mmol/L — AB (ref 98–111)
CO2: 29 mmol/L (ref 22–32)
CREATININE: 0.73 mg/dL (ref 0.44–1.00)
Calcium: 7.7 mg/dL — ABNORMAL LOW (ref 8.9–10.3)
GFR calc non Af Amer: >60 mL/min (ref >60)
Glucose, Bld: 98 mg/dL (ref 70–99)
POTASSIUM: 3.6 mmol/L (ref 3.5–5.1)
Sodium: 135 mmol/L (ref 135–145)

## 2018-01-09 LAB — CBC
HEMATOCRIT: 31.5 % — AB (ref 36.0–46.0)
Hemoglobin: 9.8 g/dL — ABNORMAL LOW (ref 12.0–15.0)
MCH: 27.6 pg (ref 26.0–34.0)
MCHC: 31.1 g/dL (ref 30.0–36.0)
MCV: 88.7 fL (ref 78.0–100.0)
Platelets: 257 10*3/uL (ref 150–400)
RBC: 3.55 MIL/uL — AB (ref 3.87–5.11)
RDW: 13.4 % (ref 11.5–15.5)
WBC: 15.4 10*3/uL — AB (ref 4.0–10.5)

## 2018-01-09 LAB — GLUCOSE, CAPILLARY
GLUCOSE-CAPILLARY: 108 mg/dL — AB (ref 70–99)
Glucose-Capillary: 75 mg/dL (ref 70–99)
Glucose-Capillary: 87 mg/dL (ref 70–99)
Glucose-Capillary: 121 mg/dL — ABNORMAL HIGH (ref 70–99)

## 2018-01-09 MED ORDER — ACETAMINOPHEN 325 MG PO TABS
650.0000 mg | ORAL_TABLET | ORAL | Status: DC | PRN
  Administered 2018-01-09 – 2018-01-10 (×2): 650 mg via ORAL
  Filled 2018-01-09 (×2): qty 2

## 2018-01-09 NOTE — Progress Notes (Signed)
Pt complaining of back pain 8/10 after receiving tylenol tablet 650mg . Administered PRN pain medicine 12.5 mcg fentanyl @1656 . Will reassess pt and continue to monitor and treat.

## 2018-01-09 NOTE — Progress Notes (Signed)
PT Cancellation Note  Patient Details Name: Natalie Macdonald MRN: [id number] DOB: 1956/02/02   Cancelled Treatment:    Reason Eval/Treat Not Completed: Patient declined, no reason specified  PT presenting to patients room for hopeful progression of mobility. Patient asleep upon entering with husband present. Husband stating that he is confused as to why patient is receiving PT in the acute setting. PT educating on need for mobility to maximize safe functional mobility prior to d/c as well as need for mobility for pressure relief for sacral wound for optimal healing. Husband stating "we paid for PT in the home and they never came, I don't know why she needs PT here." Further education on benefits of PT (safety, balance, mobility, wound healing, general health benefits) with patients husband stating "we are currently involved in litigation and you probably shouldn't touch her."  Will follow-up at the next available date.   Lanney Gins, PT, DPT 01/09/18 12:55 PM Pager: [phone]

## 2018-01-09 NOTE — Progress Notes (Signed)
Pt complaining of pain 10/10 in her back, Pt requested tylenol, given tylenol solution 650mg , Pt refused the solution, stating she only wants pill form. RN notified pharmacy and pharmacy changed the order to a 650mg  tylenol tablet. Administered PO 650mg  tylenol tablet @1434 . Will reassess pt and continue to monitor and treat per MD orders.

## 2018-01-09 NOTE — Progress Notes (Signed)
Pt refused repositioning every 2 hours. Pt stated she was having back pain rating it a 10/10, given PRN pain medicine fentanyl 12.50mcg @0938 . Educated pt on importance of mobility and repositioning every 2 hours, RN stated she will come back to reposition pt after giving pain medicine. Pt declined further movement. Reassessed pt 30 minutes later, pt was asleep in bed. Physical therapy came to work with pt on mobility, pt was asleep and the pt's husband declined any type of movement. RN was notified. RN came to the room and pt's husband wanted to get information about discharge. RN called Erlinda Hong, MD. Erlinda Hong, MD came to the room and spoke with the pt and pt's husband about the plan of care. Pt and pt's husband refused WOC and an xray for the sacral wound. After speaking with the pt we were able to change the pt's dressing and place new linens. Pt states they will be leaving against medical advice today. RN and case manger, Levada Dy explained the importance of the IV antibiotics pt is currently receiving. Pt and pt's husband verbalized understanding and stated "we have not yet decided what we want to do, but let her get the antibiotics." Will continue to monitor and treat pt per MD orders.

## 2018-01-09 NOTE — Progress Notes (Signed)
PROGRESS NOTE  Laurencia Roma YWV:[id number] DOB: 11-30-1955 DOA: 01/03/2018 PCP: Clifton Custard, MD  HPI/Recap of past 24 hours:  Wants to go home, refused being examined  Assessment/Plan: Principal Problem:   Sepsis ([place]) Active Problems:   Cellulitis   Sacral decubitus ulcer, stage III ([place])   AKI (acute kidney injury) ([place])   Dehydration   Type 2 diabetes mellitus ([place])   Acute encephalopathy   Septic shock ([place])   Acute respiratory failure with hypoxemia ([facility])   Diarrhea    Sepsis ([place]) with septic shock, metabolic encephalopathy, CAP, sacral decub ulcer III, presented on admission -Sepsis physiology now resolved, patient required vasopressors at the time of admission, admitted to critical care -Patient was intubated on 01/03/2018, and extubated on 01/04/2018 -Blood cultures negative till date, urine cultures negative -Wound culture showed MRSA, currently on IV vancomycin and Zosyn, will likely transition to oral doxycycline for 2 weeks at discharge with wound care. -Leukocytosis with white count trending up 14.8 today -ID was consulted, patient was seen by Dr. Megan Salon,  patient and husband refused Dr. Megan Salon to examine the patient.  -patient refused being examined initially,  after prolonged discussion, Patient did allow me to look at the wound, but refused to have picture taken, refused to have further imaging to determine the extend of wound /underline infection   Acute respiratory failure with hypoxia -Patient was intubated on 01/03/2018 and extubated on 7/5  -O2 sats 98% on room air       AKI (acute kidney injury) ([place]) -At the time of admission, patient presented with creatinine of 3.99 likely due to #1, currently resolved with IV fluid hydration -Creatinine resolved 0.7  Hypokalemia, hypophosphatemia -Potassium 3.1, continue daily replacement - continue phosphorus replacement  Hypertension, hyperlipidemia  -BP stable, continue Lopressor, labetalol as  needed     Type 2 diabetes mellitus ([place]) -Patient reports allergy to NovoLog and does not want Humulin. -Will increase Lantus to 23 units at bedtime, husband declined to bring Apidra from home - placed on regular insulin per sliding scale as recommended by the diabetic coordinator       Diarrhea -C. difficile negative, diarrhea improving, loperamide as needed  History of CVA with left-sided hemiparesis - No new acute issues  Morbid obesity, BMI of 43 Patient was counseled on diet and weight control       Code Status: full  Family Communication: patient and husband They wants to leave the hospital, husband declined to provide patient pcp info.  Husband provide inconsistent history with the care patient has been getting at home. he declined to answer questions,  he states patient in the past received hyperbaric chamber therapy for her sacral wound, and received care at [facility]. He declined to offer any details. There is no info under care everywhere, husbands states he is having litigation issues with [facility] that he does not want to gets into details.  Difficult to provide care and arrange home care and follow up appointments, as husband and patient is refusing care being offered and  reluctant to provide required info for initiating home care and follow up appointment. They declined snf placement as well  Disposition Plan: pending   Consultants:  Infectious disease  Critical care  Wound care  Procedures: Intubation, extubation  Central line placement  Antibiotics:  As above   Objective: BP (!) 148/91 (BP Location: Right Arm)   Pulse 92   Temp 97.9 F (36.6 C) (Oral)   Resp 16   Ht  5\' 6"  (1.676 m)   Wt 115 kg (253 lb 8.5 oz)   LMP  (LMP Unknown)   SpO2 98%   BMI 40.92 kg/m   Intake/Output Summary (Last 24 hours) at 01/09/2018 1809 Last data filed at 01/09/2018 1700 Gross per 24 hour  Intake 3334.12 ml  Output 2200 ml  Net 1134.12 ml   Filed  Weights   01/07/18 0700 01/08/18 0449 01/09/18 0500  Weight: 122.7 kg (270 lb 8.1 oz) 116.8 kg (257 lb 8 oz) 115 kg (253 lb 8.5 oz)    Exam: Patient is examined daily including today on 01/09/2018, exams remain the same as of yesterday except that has changed    General:  Chronically ill appearing, fail, awake, does not cooperate with exam  Cardiovascular: RRR  Respiratory: diminished at basis  Abdomen: Soft/ND/NT, positive BS  Musculoskeletal: No Edema, sacral wound, bilateral buttock wound appear with maceration but I was only allowed to look at it  Neuro: alert, oriented   Data Reviewed: Basic Metabolic Panel: Recent Labs  Lab 01/03/18 0615 01/04/18 0519 01/05/18 0450 01/05/18 1937 01/06/18 0409 01/07/18 0429 01/07/18 1230 01/08/18 0500 01/09/18 0416  NA 137 138 138 135 135 134*  --  135 135  K 4.2 3.2* 2.6* 3.0* 2.8* 2.6* 3.0* 3.1* 3.6  CL 105 109 106 102 101 99  --  98 97*  CO2 20* 20* 25 25 26 28   --  29 29  GLUCOSE 245* 382* 98 226* 183* 203*  --  153* 98  BUN 59* 36* 16 11 9  6*  --  <5* 5*  CREATININE 3.99* 1.86* 0.95 0.86 0.79 0.79  --  0.75 0.73  CALCIUM 7.6* 6.5* 6.7* 7.1* 7.3* 7.5*  --  7.8* 7.7*  MG 1.8 1.7 1.8  --   --  1.6*  --   --   --   PHOS 4.0 1.5* 1.2*  --  1.5*  --   --   --   --    Liver Function Tests: Recent Labs  Lab 01/03/18 0126 01/03/18 0615  AST 80*  --   ALT 38  --   ALKPHOS 78  --   BILITOT 0.7  --   PROT 6.7  --   ALBUMIN 2.6* 2.1*   No results for input(s): LIPASE, AMYLASE in the last 168 hours. No results for input(s): AMMONIA in the last 168 hours. CBC: Recent Labs  Lab 01/03/18 0126  01/05/18 0232 01/06/18 0923 01/07/18 0429 01/08/18 0500 01/09/18 0416  WBC 16.1*   < > 12.6* 10.6* 12.4* 14.8* 15.4*  NEUTROABS 11.9*  --   --   --   --   --   --   HGB 11.9*   < > 9.8* 9.0* 9.3* 9.8* 9.8*  HCT 38.5   < > 31.7* 28.1* 29.7* 30.9* 31.5*  MCV 89.5   < > 89.3 86.2 86.1 87.0 88.7  PLT 278   < > 234 202 223 245 257    < > = values in this interval not displayed.   Cardiac Enzymes:   Recent Labs  Lab 01/03/18 0119  TROPONINI 0.04*   BNP (last 3 results) No results for input(s): BNP in the last 8760 hours.  ProBNP (last 3 results) No results for input(s): PROBNP in the last 8760 hours.  CBG: Recent Labs  Lab 01/08/18 1649 01/08/18 2128 01/09/18 0752 01/09/18 1201 01/09/18 1635  GLUCAP 129* 115* 75 87 121*    Recent Results (from the past  240 hour(s))  Blood Culture (routine x 2)     Status: None   Collection Time: 01/03/18  1:20 AM  Result Value Ref Range Status   Specimen Description BLOOD RIGHT HAND  Final   Special Requests   Final    BOTTLES DRAWN AEROBIC ONLY Blood Culture results may not be optimal due to an inadequate volume of blood received in culture bottles   Culture   Final    NO GROWTH 5 DAYS Performed at [facility], [place] [address] [place], [place] [address]    Report Status 01/08/2018 FINAL  Final  Blood Culture (routine x 2)     Status: None   Collection Time: 01/03/18  1:35 AM  Result Value Ref Range Status   Specimen Description BLOOD RIGHT ARM  Final   Special Requests   Final    BOTTLES DRAWN AEROBIC AND ANAEROBIC Blood Culture results may not be optimal due to an excessive volume of blood received in culture bottles   Culture   Final    NO GROWTH 5 DAYS Performed at [facility], [place] [address], [place], [place] [address]    Report Status 01/08/2018 FINAL  Final  Urine culture     Status: None   Collection Time: 01/03/18  2:38 AM  Result Value Ref Range Status   Specimen Description URINE, CATHETERIZED  Final   Special Requests NONE  Final   Culture   Final    NO GROWTH Performed at [facility], [place] [address], [place], [place] [address]    Report Status 01/04/2018 FINAL  Final  MRSA PCR Screening     Status: Abnormal   Collection Time: 01/03/18  4:53 AM  Result Value Ref Range Status   MRSA by PCR POSITIVE (A) NEGATIVE Final     Comment:        The GeneXpert MRSA Assay (FDA approved for NASAL specimens only), is one component of a comprehensive MRSA colonization surveillance program. It is not intended to diagnose MRSA infection nor to guide or monitor treatment for MRSA infections. RESULT CALLED TO, READ BACK BY AND VERIFIED WITH: A. Lewis RN 9:20 01/03/18 ([email or web address]) Performed at [facility], [place] [address], [place], [place] [address]   Aerobic Culture (superficial specimen)     Status: None   Collection Time: 01/03/18  4:53 AM  Result Value Ref Range Status   Specimen Description SKIN  Final   Special Requests NONE  Final   Gram Stain   Final    NO WBC SEEN NO ORGANISMS SEEN Performed at [facility], [place] [address], [place],  [address]    Culture   Final    FEW METHICILLIN RESISTANT STAPHYLOCOCCUS AUREUS RARE CANDIDA ALBICANS    Report Status 01/06/2018 FINAL  Final   Organism ID, Bacteria METHICILLIN RESISTANT STAPHYLOCOCCUS AUREUS  Final      Susceptibility   Methicillin resistant staphylococcus aureus - MIC*    CIPROFLOXACIN >=8 RESISTANT Resistant     ERYTHROMYCIN >=8 RESISTANT Resistant     GENTAMICIN <=0.5 SENSITIVE Sensitive     OXACILLIN >=4 RESISTANT Resistant     TETRACYCLINE <=1 SENSITIVE Sensitive     VANCOMYCIN <=0.5 SENSITIVE Sensitive     TRIMETH/SULFA <=10 SENSITIVE Sensitive     CLINDAMYCIN <=0.25 SENSITIVE Sensitive     RIFAMPIN <=0.5 SENSITIVE Sensitive     Inducible Clindamycin NEGATIVE Sensitive     * FEW METHICILLIN RESISTANT STAPHYLOCOCCUS AUREUS  C  difficile quick scan w PCR reflex     Status: None   Collection Time: 01/06/18  4:44 AM  Result Value Ref Range Status   C Diff antigen NEGATIVE NEGATIVE Final   C Diff toxin NEGATIVE NEGATIVE Final   C Diff interpretation No C. difficile detected.  Final    Comment: Performed at [facility], [place] [address] [place], [place] [address]     Studies: No results found.  Scheduled Meds: .  chlorhexidine  15 mL Mouth Rinse BID  . DULoxetine  60 mg Oral Daily  . famotidine  20 mg Oral QHS  . feeding supplement (ENSURE ENLIVE)  237 mL Oral BID BM  . heparin  5,000 Units Subcutaneous Q8H  . insulin glargine  23 Units Subcutaneous QHS  . insulin regular  0-9 Units Subcutaneous TID AC  . levothyroxine  150 mcg Oral QAC breakfast  . mouth rinse  15 mL Mouth Rinse q12n4p  . morphine  30 mg Oral BID  . multivitamin with minerals  1 tablet Oral Daily  . oxybutynin  15 mg Oral Daily  . phosphorus  500 mg Oral BID  . potassium chloride  40 mEq Oral Daily  . vitamin B-6  250 mg Oral Daily  . sodium chloride flush  10-40 mL Intracatheter Q12H  . vitamin B-12  1,000 mcg Oral Daily    Continuous Infusions: . sodium chloride 50 mL/hr at 01/09/18 0452  . piperacillin-tazobactam (ZOSYN)  IV 3.375 g (01/09/18 1217)  . vancomycin 1,000 mg (01/09/18 1405)     Time spent:  60 mins I have personally reviewed and interpreted on  01/09/2018 daily labs, tele strips, imagings as discussed above under date review session and assessment and plans.  I reviewed all nursing notes, pharmacy notes, consultant notes,  vitals, pertinent old records  I have discussed plan of care as described above with RN , patient and family on 01/09/2018   Florencia Reasons MD, PhD  [facility] Pager [phone]. If 7PM-7AM, please contact night-coverage at [email or web address], password [facility] 01/09/2018, 6:09 PM  LOS: 6 days

## 2018-01-09 NOTE — Progress Notes (Signed)
Pt refused multiple attempts to be repositioned, stating she is comfortable on her back. Reason for repositioning explained to patient.

## 2018-01-09 NOTE — Consult Note (Signed)
[facility] Nurse wound consult note Sacral wound.  Patient is in [place].  This is now the 4th consult to the [facility] team for the same wound; prior consults 7/4, 7/5, and 7/7.  After speaking with the primary RN, Camelia, it does not seem an additional consult is needed. Monitor the wound area(s) for worsening of condition such as: Signs/symptoms of infection,  Increase in size,  Development of or worsening of odor, Development of pain, or increased pain at the affected locations.  Notify the medical team if any of these develop.  Thank you for the consult.  Discussed plan of care with the patient and bedside nurse.  [facility] nurse will not follow at this time.  Please re-consult the [place] team if needed.  Val Riles, RN, MSN, CWOCN, CNS-BC, pager [phone]

## 2018-01-09 NOTE — Care Management Note (Addendum)
Case Management Note  Patient Details  Name: Natalie Macdonald MRN: [id number] Date of Birth: 10/12/1955  Subjective/Objective:   Admitted with sepsis and AMS.From home with husband and daughter. PTA husband and daughter assisted pt with personal care needs.       7/11 - 1400  Pt /husband stated daughter to assist with caring for mom once d/c.  Placed home health services on hold, states  they will f/u with PCP if needed once d/c.  Natalie Macdonald (Spouse)      [phone]      PCP: Clifton Custard    Action/Plan: Transition to home when medically stable with home health services to follow. Per PT's recommendations:SNF;Supervision/Assistance - 24 hour(patient and husband likely to refuse - may need HHPT). Pt declined SNF placement.  NCM scheduled wound care f/u with [facility] for 01/29/2018 @ 0915.  Husband states will provide pt with transportation to home once d/c.  Address: [address] [place], [place] [address] Phone: [phone] Expected Discharge Date:                  Expected Discharge Plan:  [place]  In-House Referral:   CSW  Discharge planning Services  CM Consult  Post Acute Care Choice:    Choice offered to:  Patient  DME Arranged:    DME Agency:     HH Arranged:  RN, PT [place] Agency:  [facility], pending MD's orders. NCM has requested orders from MD. aware  Status of Service:  In process, will continue to follow  If discussed at Long Length of Stay Meetings, dates discussed:    Additional Comments:  Sharin Mons, RN 01/09/2018, 1:46 PM

## 2018-01-10 DIAGNOSIS — E119 Type 2 diabetes mellitus without complications: Secondary | ICD-10-CM

## 2018-01-10 LAB — COMPREHENSIVE METABOLIC PANEL WITH GFR
ALT: 26 U/L (ref 0–44)
AST: 19 U/L (ref 15–41)
Albumin: 2 g/dL — ABNORMAL LOW (ref 3.5–5.0)
Alkaline Phosphatase: 64 U/L (ref 38–126)
Anion gap: 8 (ref 5–15)
BUN: <5 mg/dL — ABNORMAL LOW (ref 8–23)
CO2: 26 mmol/L (ref 22–32)
Calcium: 7.9 mg/dL — ABNORMAL LOW (ref 8.9–10.3)
Chloride: 100 mmol/L (ref 98–111)
Creatinine, Ser: 0.78 mg/dL (ref 0.44–1.00)
GFR calc Af Amer: >60 mL/min
GFR calc non Af Amer: >60 mL/min
Glucose, Bld: 105 mg/dL — ABNORMAL HIGH (ref 70–99)
Potassium: 3.9 mmol/L (ref 3.5–5.1)
Sodium: 134 mmol/L — ABNORMAL LOW (ref 135–145)
Total Bilirubin: 0.5 mg/dL (ref 0.3–1.2)
Total Protein: 5.3 g/dL — ABNORMAL LOW (ref 6.5–8.1)

## 2018-01-10 LAB — CBC WITH DIFFERENTIAL/PLATELET
Abs Immature Granulocytes: 0.5 10*3/uL — ABNORMAL HIGH (ref 0.0–0.1)
BASOS PCT: 0 %
Basophils Absolute: 0 10*3/uL (ref 0.0–0.1)
EOS ABS: 0.3 10*3/uL (ref 0.0–0.7)
EOS PCT: 2 %
HEMATOCRIT: 30.8 % — AB (ref 36.0–46.0)
Hemoglobin: 9.7 g/dL — ABNORMAL LOW (ref 12.0–15.0)
Immature Granulocytes: 3 %
Lymphocytes Relative: 27 %
Lymphs Abs: 4.1 10*3/uL — ABNORMAL HIGH (ref 0.7–4.0)
MCH: 27.7 pg (ref 26.0–34.0)
MCHC: 31.5 g/dL (ref 30.0–36.0)
MCV: 88 fL (ref 78.0–100.0)
MONO ABS: 1.1 10*3/uL — AB (ref 0.1–1.0)
MONOS PCT: 7 %
NEUTROS PCT: 61 %
Neutro Abs: 9.2 10*3/uL — ABNORMAL HIGH (ref 1.7–7.7)
Platelets: 302 10*3/uL (ref 150–400)
RBC: 3.5 MIL/uL — ABNORMAL LOW (ref 3.87–5.11)
RDW: 13.5 % (ref 11.5–15.5)
WBC: 15.1 10*3/uL — ABNORMAL HIGH (ref 4.0–10.5)

## 2018-01-10 LAB — MAGNESIUM: Magnesium: 1.5 mg/dL — ABNORMAL LOW (ref 1.7–2.4)

## 2018-01-10 LAB — GLUCOSE, CAPILLARY
Glucose-Capillary: 68 mg/dL — ABNORMAL LOW (ref 70–99)
Glucose-Capillary: 110 mg/dL — ABNORMAL HIGH (ref 70–99)
Glucose-Capillary: 133 mg/dL — ABNORMAL HIGH (ref 70–99)

## 2018-01-10 LAB — PHOSPHORUS: Phosphorus: 3.7 mg/dL (ref 2.5–4.6)

## 2018-01-10 MED ORDER — MAGNESIUM SULFATE 2 GM/50ML IV SOLN
2.0000 g | Freq: Once | INTRAVENOUS | Status: AC
  Administered 2018-01-10: 2 g via INTRAVENOUS
  Filled 2018-01-10: qty 50

## 2018-01-10 NOTE — Progress Notes (Signed)
Patient requesting prescriptions for pain medication for home use, provider notified via text page.  Provider response: Per hospital policy long term pain medications must be prescribed by primary care.

## 2018-01-10 NOTE — Discharge Summary (Signed)
Discharge Summary  Natalie Macdonald DTO:[id number] DOB: Apr 02, 1956  PCP: Clifton Custard, MD  Admit date: 01/03/2018 Discharge date: 01/10/2018  Time spent: 10mins, more than 50% time spent on coordination of care  Recommendations for Outpatient Follow-up:  1. F/u with PMD within a week  for hospital discharge follow up, repeat cbc/bmp at follow up. 2. F/u with wound care closely, wound center appointment made 3. Patient and family declined SNF , they elected to go home. 4. Case manager consulted for home health arrangement.  Discharge Diagnoses:  Active Hospital Problems   Diagnosis Date Noted  . Sepsis ([place]) 01/03/2018  . Diarrhea 01/06/2018  . Cellulitis 01/03/2018  . Sacral decubitus ulcer, stage III ([place]) 01/03/2018  . AKI (acute kidney injury) ([place]) 01/03/2018  . Dehydration 01/03/2018  . Type 2 diabetes mellitus ([place]) 01/03/2018  . Acute encephalopathy   . Septic shock ([place])   . Acute respiratory failure with hypoxemia [facility])     Resolved Hospital Problems  No resolved problems to display.    Discharge Condition: stable  Diet recommendation: heart healthy/carb modified  Filed Weights   01/08/18 0449 01/09/18 0500 01/10/18 0811  Weight: 116.8 kg (257 lb 8 oz) 115 kg (253 lb 8.5 oz) 112.2 kg (247 lb 5.7 oz)    History of present illness: (per critical care admitting provided NP Ms Jennelle Human) ADMISSION DATE:  01/03/2018 CONSULTATION DATE:  01/03/2018  REFERRING MD:  Dr. Stark Jock  CHIEF COMPLAINT:  Sepsis  HISTORY OF PRESENT ILLNESS:   HPI obtained from medical chart review, see separate MRN [id number], and per daughter and husband at bedside as patient has acute encephalopathy.   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   Patient lives at home and is cared for by her daughter.  Since  her cervical surgery in 2018, patient has been mostly wheelchair bound.  At times she is able to verbalize with hoarseness at others with mouthing words and is normally alert and oriented. She eats a regular diet and daughter reports she just had her yearly exam last week.  She was placed on an unknown antibiotic for buttock wound which family reports is ongoing reoccurring issue.   Daughter reports patient was in her normal state of health on 6/1, however the next day did not eat or drink as much.  On 6/3, patient has been somnolent with intermittent episodes of consciousness.    In ER, she was found febrile at 102.7, tachycardic, hypotensive, and hypoxic requiring nasal cannula.  She has been intermittent responsive.  Labs significant for glucose 222, BUN 60, sCr 4.73, albumin 2.6, AST 80, WBC 16.1 with mild left shift, lactic 1.75.  CT head negative, CXR shallow but possible RUL inflitrate.  UA pending.  Patient treated with NS 500, vancomycin and zosyn after blood cultures.  Remains hypotensive, currently receiving the remainder of her 30 ml/kg at this time. PCCM called for admission.      Hospital Course:  Principal Problem:   Sepsis ([place]) Active Problems:   Cellulitis   Sacral decubitus ulcer, stage III (HCC)   AKI (acute kidney injury) ([place])   Dehydration   Type 2 diabetes mellitus (HCC)   Acute encephalopathy   Septic shock (HCC)   Acute respiratory failure with hypoxemia (HCC)   Diarrhea   Sepsis ([place]) with septic shock, metabolic encephalopathy, sacral decub ulcer  III, presented on admission - patient required vasopressors at the time of admission, admitted to critical care,Patient was intubated on admission on 01/03/2018, , she is weaned of pressors and extubated on 01/04/2018 -ct head on presentation "Old lacunar infarct within the right cerebellar hemisphere , mild microvascular ischemic disease without superimposed acute intracranial process." -cxr on presentation "Shallow  inspiration.  No evidence of active pulmonary disease" --Wound culture showedMRSA, she received total of 8 days of  IV vancomycin and Zosyn since admission. -Blood cultures negative till date, urine cultures negative- -Sepsis physiology now resolved, -ID was consulted, patient was seen by Dr. Megan Salon, patient and husband refusedDr. Megan Salon to examine the patient. -patient refused being examined by me initially,  after prolonged discussion, Patient did allow me to look at the wound on 7/10, but refused to have picture taken to put on file on 7/10, refused to have further imaging to determine the extend of wound /underline infection. Patient and family refused to disclose information about duration of sacral wound.  -I have discussed with Dr. Megan Salon over the phone who do not recommend further abx at discharge due to blood culture negative, urine culture negative.  -Patient is advised to close follow-up with primary care doctor at wound care center.     Acute respiratory failure with hypoxia -Patient was intubated on 01/03/2018 and extubated on 7/5  -O2 sats 98% on room air   AKI (acute kidney injury) ([place]) on CKDII -At the time of admission, patient presented with bun 60/cr 4.74 -Creatinine resolved 0.7 after treating sepsis  Hypokalemia, hypophosphatemia, hypomagnesemia -replaced   insulin dependent Type 2 diabetes mellitus ([place]) -Patient reports allergy to NovoLog and does not want Humulin. -resume home dose insulin (toujeo/apidra)  Diarrhea -C. difficile negative, diarrhea improving, loperamide as needed  History of CVA with left-sided hemiparesis -Ct head on presentation "Old lacunar infarct within the right cerebellar hemisphere (image 8, series 3).  mild microvascular ischemic disease without superimposed acute intracranial process." - No new acute issues -patient states she does not have h/o stroke.   Body mass index is 39.92  kg/m.     Code Status: full  Family Communication: patient and husband They wants to leave the hospital, husband declined to provide patient pcp info.  Husband provide inconsistent history with the care patient has been getting at home .   he states patient in the past received hyperbaric chamber therapy for her sacral wound, and received care at [place]. He declined to offer any details. There is no info under care everywhere, husbands states he is having litigation issues with [facility] that he does not want to gets into details.  Difficult to provide care and arrange home care and follow up appointments, as husband and patient is refusing care being offered and  reluctant to provide required info for initiating home care and follow up appointment. They declined snf placement as well  Disposition Plan: home with home care   Consultants:  Infectious disease  Critical care  Wound care  Procedures: Intubation, extubation  Central line placement  Antibiotics:  As above   Discharge Exam: BP 139/90   Pulse 92   Temp 98.3 F (36.8 C) (Oral)   Resp 19   Ht 5\' 6"  (1.676 m)   Wt 112.2 kg (247 lb 5.7 oz)   LMP  (LMP Unknown)   SpO2 98%   BMI 39.92 kg/m   General: NAD, aaox3, obese Cardiovascular: RRR Respiratory: CTABL Neuro: left sided weakness Sacral ulcer presented  on admission   Sacral wound pic taken on presentation on 7/4  by critical care:      Discharge Instructions You were cared for by a hospitalist during your hospital stay. If you have any questions about your discharge medications or the care you received while you were in the hospital after you are discharged, you can call the unit and asked to speak with the hospitalist on call if the hospitalist that took care of you is not available. Once you are discharged, your primary care physician will handle any further medical issues. Please note that NO REFILLS for any discharge medications  will be authorized once you are discharged, as it is imperative that you return to your primary care physician (or establish a relationship with a primary care physician if you do not have one) for your aftercare needs so that they can reassess your need for medications and monitor your lab values.  Discharge Instructions    Diet - low sodium heart healthy   Complete by:  As directed    Carb modified   Increase activity slowly   Complete by:  As directed      Allergies as of 01/10/2018      Reactions   Grapefruit Flavor [flavoring Agent]    Novolog [insulin Aspart]    Okay to use Regular insulin (patient doesn't tolerate Novolog)    Procaine       Medication List    TAKE these medications   APIDRA 100 UNIT/ML injection Generic drug:  insulin glulisine INJECT 18-25 UNITS SUBCUTANEOUSLY THREE TIMES A DAY WITH MEALS   diclofenac 75 MG EC tablet Commonly known as:  VOLTAREN Take 75 mg by mouth 2 (two) times daily.   DULoxetine 30 MG capsule Commonly known as:  CYMBALTA Take 60 mg by mouth daily.   fluticasone 50 MCG/ACT nasal spray Commonly known as:  FLONASE Place 2 sprays into both nostrils as needed for allergies or rhinitis.   gabapentin 300 MG capsule Commonly known as:  NEURONTIN Take 300 mg by mouth 3 (three) times daily.   levothyroxine 150 MCG tablet Commonly known as:  SYNTHROID, LEVOTHROID Take 150 mcg by mouth daily before breakfast.   morphine 30 MG 12 hr tablet Commonly known as:  MS CONTIN Take 30 mg by mouth 2 (two) times daily.   MULTI FOR HER 50+ Tabs Take 1 tablet by mouth daily.   oxybutynin 15 MG 24 hr tablet Commonly known as:  DITROPAN XL Take 15 mg by mouth daily.   tiZANidine 4 MG tablet Commonly known as:  ZANAFLEX TAKE 1 TABLET BY MOUTH EVERY 8 HOURS AS NEEDED NO MORE THAN 3 DOSES IN 24 HOURS   TOUJEO SOLOSTAR 300 UNIT/ML Sopn Generic drug:  Insulin Glargine INJECT 40 UNITS SUBCUTANEOUSLY TITRATING TO 50 UNITS AT BEDTIME   vitamin  B-12 1000 MCG tablet Commonly known as:  CYANOCOBALAMIN Take 1,000 mcg by mouth daily.   vitamin B-6 250 MG tablet Take 250 mg by mouth daily.      Allergies  Allergen Reactions  . Grapefruit Flavor [Flavoring Agent]   . Novolog [Insulin Aspart]     Okay to use Regular insulin (patient doesn't tolerate Novolog)    . Procaine    Follow-up Information     [facility]             . Go on 01/29/2018.   Why:  Appointment time 9:15am, arrive by 9am for check in Contact information: [phone] [place] [id number] [phone]       follow up with your family doctor in one week Follow up in 1 week(s).   Why:  for hospital discharge follow up. repeat cbc/bmp at hospital discharge follow up. pcp to continue work with your for blood glucose management.        please follow with wound care closely. Follow up.            The results of significant diagnostics from this hospitalization (including imaging, microbiology, ancillary and laboratory) are listed below for reference.    Significant Diagnostic Studies: Ct Head Wo Contrast  Result Date: 01/03/2018 CLINICAL DATA:  Altered mental status.  Sepsis. EXAM: CT HEAD WITHOUT CONTRAST TECHNIQUE: Contiguous axial images were obtained from the base of the skull through the vertex without intravenous contrast. COMPARISON:  Head CT - earlier same day; FINDINGS: Brain: Scattered periventricular hypodensities compatible with microvascular ischemic disease. Old lacunar infarct within the right cerebellar hemisphere (image 8, series 3). Gray-white differentiation is otherwise well maintained without CT evidence of superimposed acute large territory infarct. No intraparenchymal or extra-axial mass or hemorrhage. Grossly unchanged asymmetric prominence of the occipital horn of the left lateral ventricle. Unchanged size and configuration the ventricles and basilar cisterns. No midline  shift. Vascular: Intracranial atherosclerosis. Skull: No displaced calvarial fracture. Sinuses/Orbits: Limited visualization of the paranasal sinuses and mastoid air cells is normal. No air-fluid levels. Other: Regional soft tissues appear normal. The patient is intubated. IMPRESSION: Similar findings of mild microvascular ischemic disease without superimposed acute intracranial process. Electronically Signed   By: Sandi Mariscal M.D.   On: 01/03/2018 14:52   Ct Head Wo Contrast  Result Date: 01/03/2018 CLINICAL DATA:  Unresponsiveness. Last seen normal greater than 24 hours ago. EXAM: CT HEAD WITHOUT CONTRAST TECHNIQUE: Contiguous axial images were obtained from the base of the skull through the vertex without intravenous contrast. COMPARISON:  None. FINDINGS: Brain: Mild cerebral atrophy. No evidence of acute infarction, hemorrhage, hydrocephalus, extra-axial collection or mass lesion/mass effect. Vascular: Intracranial arterial vascular calcifications are present. Skull: Normal. Negative for fracture or focal lesion. Sinuses/Orbits: No acute finding. Other: None. IMPRESSION: No acute intracranial abnormalities.  Mild cerebral atrophy. Electronically Signed   By: Lucienne Capers M.D.   On: 01/03/2018 01:57   Dg Chest Port 1 View  Result Date: 01/05/2018 CLINICAL DATA:  Status postextubation EXAM: PORTABLE CHEST 1 VIEW COMPARISON:  January 03, 2018 FINDINGS: Endotracheal tube and nasogastric tube have been removed. Central catheter tip is in the superior vena cava. No pneumothorax. There is mild atelectasis in the left mid lung. Lungs elsewhere are clear. Heart is mildly enlarged with pulmonary vascularity normal. No adenopathy. 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 There is postoperative change in the lower cervical spine. IMPRESSION: Central catheter tip in superior vena cava. No pneumothorax. Mild left midlung atelectasis. Lungs elsewhere clear. Stable cardiomegaly. Advanced arthropathy both  shoulders. Electronically Signed   By: Lowella Grip III M.D.   On: 01/05/2018 07:14   Dg Chest Port 1 View  Result Date: 01/03/2018 CLINICAL DATA:  ET tube and OG tube inserted, check tip position microphone is at the side of my mouth EXAM: PORTABLE CHEST 1 VIEW COMPARISON:  01/03/2018 at 0054 hours FINDINGS: Endotracheal tube tip projects 1 cm above the Carina, well positioned. Nasal/orogastric tube tip projects below the Rienzi in the expected location of the mid esophagus. It does not pass into the stomach. It will need to be  further inserted at least 15-20 cm to allow the tip to fully into the stomach. New left subclavian central venous line tip projects just above the caval atrial junction, well positioned. No pneumothorax. There is opacity in the medial upper lobes extending above both hila and at the left lung base, most likely atelectasis accentuated by low lung volumes. IMPRESSION: 1. Endotracheal tube and left subclavian central venous line are well positioned. 2. Nasogastric tube tip projects in the mid esophagus. Recommend further inserting 15-20 cm. 3. No pneumothorax. No change in lung aeration since the previous study. Electronically Signed   By: Lajean Manes M.D.   On: 01/03/2018 11:08   Dg Chest Portable 1 View  Result Date: 01/03/2018 CLINICAL DATA:  Patient is unresponsive. EXAM: PORTABLE CHEST 1 VIEW COMPARISON:  None. FINDINGS: Shallow inspiration. Normal heart size and pulmonary vascularity. No focal airspace disease or consolidation in the lungs. No blunting of costophrenic angles. No pneumothorax. Mediastinal contours appear intact. Prominent degenerative changes in the shoulders. IMPRESSION: Shallow inspiration.  No evidence of active pulmonary disease. Electronically Signed   By: Lucienne Capers M.D.   On: 01/03/2018 01:17   Dg Abd Portable 1v  Result Date: 01/03/2018 CLINICAL DATA:  NG tube placement. EXAM: PORTABLE ABDOMEN - 1 VIEW COMPARISON:  01/03/2018 at 10:46 a.m.  FINDINGS: Nasogastric tube passes below the diaphragm, tip projecting in the gastric fundus. No other change since the prior exam. Moderate increased stool throughout the colon consistent with constipation. IMPRESSION: NG tube well positioned within the stomach. Electronically Signed   By: Lajean Manes M.D.   On: 01/03/2018 11:14   Dg Abd Portable 1v  Result Date: 01/03/2018 CLINICAL DATA:  Check gastric catheter placement EXAM: PORTABLE ABDOMEN - 1 VIEW COMPARISON:  None. FINDINGS: Scattered large and small bowel gas is noted. Fecal material is noted throughout the colon consistent with a degree of constipation. No abnormal mass or abnormal calcifications are noted. Inserted gastric catheter is not visualized on this film. IMPRESSION: Known inserted gastric catheter is not visualized on this film. Changes consistent with constipation. Electronically Signed   By: Inez Catalina M.D.   On: 01/03/2018 11:05    Microbiology: Recent Results (from the past 240 hour(s))  Blood Culture (routine x 2)     Status: None   Collection Time: 01/03/18  1:20 AM  Result Value Ref Range Status   Specimen Description BLOOD RIGHT HAND  Final   Special Requests   Final    BOTTLES DRAWN AEROBIC ONLY Blood Culture results may not be optimal due to an inadequate volume of blood received in culture bottles   Culture   Final    NO GROWTH 5 DAYS Performed at [facility], [place] [address], [place], [place] [address]    Report Status 01/08/2018 FINAL  Final  Blood Culture (routine x 2)     Status: None   Collection Time: 01/03/18  1:35 AM  Result Value Ref Range Status   Specimen Description BLOOD RIGHT ARM  Final   Special Requests   Final    BOTTLES DRAWN AEROBIC AND ANAEROBIC Blood Culture results may not be optimal due to an excessive volume of blood received in culture bottles   Culture   Final    NO GROWTH 5 DAYS Performed at [facility], [place] [address], [place], [place] [address]    Report Status  01/08/2018 FINAL  Final  Urine culture     Status: None   Collection Time:  01/03/18  2:38 AM  Result Value Ref Range Status   Specimen Description URINE, CATHETERIZED  Final   Special Requests NONE  Final   Culture   Final    NO GROWTH Performed at [facility], [address] [place], [place] [address]    Report Status 01/04/2018 FINAL  Final  MRSA PCR Screening     Status: Abnormal   Collection Time: 01/03/18  4:53 AM  Result Value Ref Range Status   MRSA by PCR POSITIVE (A) NEGATIVE Final    Comment:        The GeneXpert MRSA Assay (FDA approved for NASAL specimens only), is one component of a comprehensive MRSA colonization surveillance program. It is not intended to diagnose MRSA infection nor to guide or monitor treatment for MRSA infections. RESULT CALLED TO, READ BACK BY AND VERIFIED WITH: A. Lewis RN 9:20 01/03/18 ([email or web address]) Performed at [facility], [place] [address] [place], [place] [address]   Aerobic Culture (superficial specimen)     Status: None   Collection Time: 01/03/18  4:53 AM  Result Value Ref Range Status   Specimen Description SKIN  Final   Special Requests NONE  Final   Gram Stain   Final    NO WBC SEEN NO ORGANISMS SEEN Performed at [facility], [place] [address] [place], [place] [address]    Culture   Final    FEW METHICILLIN RESISTANT STAPHYLOCOCCUS AUREUS RARE CANDIDA ALBICANS    Report Status 01/06/2018 FINAL  Final   Organism ID, Bacteria METHICILLIN RESISTANT STAPHYLOCOCCUS AUREUS  Final      Susceptibility   Methicillin resistant staphylococcus aureus - MIC*    CIPROFLOXACIN >=8 RESISTANT Resistant     ERYTHROMYCIN >=8 RESISTANT Resistant     GENTAMICIN <=0.5 SENSITIVE Sensitive     OXACILLIN >=4 RESISTANT Resistant     TETRACYCLINE <=1 SENSITIVE Sensitive     VANCOMYCIN <=0.5 SENSITIVE Sensitive     TRIMETH/SULFA <=10 SENSITIVE Sensitive     CLINDAMYCIN <=0.25 SENSITIVE Sensitive     RIFAMPIN <=0.5 SENSITIVE  Sensitive     Inducible Clindamycin NEGATIVE Sensitive     * FEW METHICILLIN RESISTANT STAPHYLOCOCCUS AUREUS  C difficile quick scan w PCR reflex     Status: None   Collection Time: 01/06/18  4:44 AM  Result Value Ref Range Status   C Diff antigen NEGATIVE NEGATIVE Final   C Diff toxin NEGATIVE NEGATIVE Final   C Diff interpretation No C. difficile detected.  Final    Comment: Performed at [facility], [place] [address], [place],  [address]     Labs: Basic Metabolic Panel: Recent Labs  Lab 01/04/18 [phone] 01/05/18 0450  01/06/18 0409 01/07/18 0429 01/07/18 1230 01/08/18 0500 01/09/18 0416 01/10/18 0427  NA 138 138   < > 135 134*  --  135 135 134*  K 3.2* 2.6*   < > 2.8* 2.6* 3.0* 3.1* 3.6 3.9  CL 109 106   < > 101 99  --  98 97* 100  CO2 20* 25   < > 26 28  --  29 29 26   GLUCOSE 382* 98   < > 183* 203*  --  153* 98 105*  BUN 36* 16   < > 9 6*  --  <5* 5* <5*  CREATININE 1.86* 0.95   < > 0.79 0.79  --  0.75 0.73 0.78  CALCIUM 6.5* 6.7*   < > 7.3* 7.5*  --  7.8* 7.7* 7.9*  MG 1.7 1.8  --   --  1.6*  --   --   --  1.5*  PHOS 1.5* 1.2*  --  1.5*  --   --   --   --  3.7   < > = values in this interval not displayed.   Liver Function Tests: Recent Labs  Lab 01/10/18 0427  AST 19  ALT 26  ALKPHOS 64  BILITOT 0.5  PROT 5.3*  ALBUMIN 2.0*   No results for input(s): LIPASE, AMYLASE in the last 168 hours. No results for input(s): AMMONIA in the last 168 hours. CBC: Recent Labs  Lab 01/06/18 0923 01/07/18 0429 01/08/18 0500 01/09/18 0416 01/10/18 0427  WBC 10.6* 12.4* 14.8* 15.4* 15.1*  NEUTROABS  --   --   --   --  9.2*  HGB 9.0* 9.3* 9.8* 9.8* 9.7*  HCT 28.1* 29.7* 30.9* 31.5* 30.8*  MCV 86.2 86.1 87.0 88.7 88.0  PLT 202 223 245 257 302   Cardiac Enzymes: No results for input(s): CKTOTAL, CKMB, CKMBINDEX, TROPONINI in the last 168 hours. BNP: BNP (last 3 results) No results for input(s): BNP in the last 8760 hours.  ProBNP (last 3 results) No  results for input(s): PROBNP in the last 8760 hours.  CBG: Recent Labs  Lab 01/09/18 1635 01/09/18 2105 01/10/18 0802 01/10/18 0853 01/10/18 1203  GLUCAP 121* 108* 68* 110* 133*       Signed:  Florencia Reasons MD, PhD  [facility] 01/10/2018, 1:01 PM

## 2018-01-10 NOTE — Plan of Care (Signed)
Patient is making progress toward discharge, will continue to monitor

## 2018-01-10 NOTE — Progress Notes (Signed)
I explained to the family and patient that pt needs to remain in bed for 30 minutes after central line removal.  Pt's husband said, "we don't have 30 minutes to wait.." Family understands risks of bleeding and air emboli and they say they need to leave now.

## 2018-01-10 NOTE — Progress Notes (Signed)
Inpatient Diabetes Program Recommendations  AACE/ADA: New Consensus Statement on Inpatient Glycemic Control (2019)  Target Ranges:  Prepandial:   less than 140 mg/dL      Peak postprandial:   less than 180 mg/dL (1-2 hours)      Critically ill patients:  140 - 180 mg/dL   Results for STANISHA, LORENZ (MRN [id number]) as of 01/10/2018 10:03  Ref. Range 01/09/2018 07:52 01/09/2018 12:01 01/09/2018 16:35 01/09/2018 21:05 01/10/2018 08:02 01/10/2018 08:53  Glucose-Capillary Latest Ref Range: 70 - 99 mg/dL 75 87 121 (H) 108 (H) 68 (L) 110 (H)    Review of Glycemic Control  Diabetes history: DM2 Outpatient Diabetes medications: Apidra 18-25 units TID with meals, Toujeo 40-50 units QHS  Current orders for Inpatient glycemic control: Lantus 23 units QHS, Regular 0-9 units TID with meals (correction scale)  Inpatient Diabetes Program Recommendations: Insulin-Basal: Please consider decreasing Lantus to 21 units QHS.  Thanks, Barnie Alderman, RN, MSN, CDE Diabetes Coordinator Inpatient Diabetes Program [phone] (Team Pager from 8am to 5pm)

## 2018-01-10 NOTE — Progress Notes (Signed)
Patient used purwick to urinate, nurse checked for any leaking, bed pad wet, attempted to change bed pad and reposition patient, patient refused but aggred to allow bed change and reposition at a later time. Nurse will continue to check in with patient and monitor.  12:26 pm: Nurse and nurse tech has made several attempts to change bed linens and reposition patient without success. Will reattempt again.

## 2018-01-10 NOTE — Progress Notes (Signed)
Physical Therapy Treatment Patient Details Name: Natalie Macdonald MRN: [id number] DOB: [birth year] Today's Date: 01/10/2018    History of Present Illness Patient is a 62 y/o female presenting with altered mental status. CT head revealing Mild cerebral atrophy, No evidence of acute infarction, hemorrhage, hydrocephalus, extra-axial collection or mass lesion/mass effect. Also with wound to B buttocks. 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    PT Comments    PT presenting to progress functional mobility, accompanied by Clinical Specialist. Patient and husband report they are going home today, but agreeable to PT assisting with bed mobility and hygiene. Discussion with patients husband regarding mobility of patient, as they plan to return home in SUV. Offered to assist with sit to stand in room to ensure patient safety and her ability to assist with car transfer, however patients husband refusing this, stating that he and his daughter "could lift her." Patient requiring Max/Total A for rolling R <> L today with physical assist to maintain position for hygiene. Patients husband reporting he has all appropriate equipment at home needed to care for patient. PT to continue to follow acutely.    Follow Up Recommendations  SNF;Supervision/Assistance - 24 hour     Equipment Recommendations  None recommended by PT    Recommendations for Other Services       Precautions / Restrictions Precautions Precautions: Fall Restrictions Weight Bearing Restrictions: No    Mobility  Bed Mobility Overal bed mobility: Needs Assistance Bed Mobility: Rolling Rolling: Max assist;Total assist;+2 for physical assistance         General bed mobility comments: max/total A +3 for rolling and for repositioning in bed  Transfers                 General transfer comment: deferred as  patients husband refused  Ambulation/Gait                 Stairs             Wheelchair Mobility    Modified Rankin (Stroke Patients Only)       Balance                                            Cognition Arousal/Alertness: Awake/alert Behavior During Therapy: Flat affect Overall Cognitive Status: Difficult to assess                                        Exercises      General Comments        Pertinent Vitals/Pain Pain Assessment: Faces Faces Pain Scale: Hurts little more Pain Location: grimacing during bed mobility Pain Intervention(s): Limited activity within patient's tolerance;Monitored during session;Repositioned    Home Living                      Prior Function            PT Goals (current goals can now be found in the care plan section) Acute Rehab PT Goals Patient Stated Goal: return home PT Goal Formulation: With patient Time For Goal Achievement: 01/21/18 Potential to Achieve Goals: Fair Progress towards PT goals: Progressing toward goals    Frequency  Min 3X/week      PT Plan Current plan remains appropriate    Co-evaluation              AM-PAC PT "6 Clicks" Daily Activity  Outcome Measure  Difficulty turning over in bed (including adjusting bedclothes, sheets and blankets)?: Unable Difficulty moving from lying on back to sitting on the side of the bed? : Unable Difficulty sitting down on and standing up from a chair with arms (e.g., wheelchair, bedside commode, etc,.)?: Unable Help needed moving to and from a bed to chair (including a wheelchair)?: Total Help needed walking in hospital room?: Total Help needed climbing 3-5 steps with a railing? : Total 6 Click Score: 6    End of Session   Activity Tolerance: Patient tolerated treatment well Patient left: in bed;with call bell/phone within reach;with nursing/sitter in room;with family/visitor present Nurse  Communication: Mobility status PT Visit Diagnosis: Other abnormalities of gait and mobility (R26.89);Muscle weakness (generalized) (M62.81)     Time: 3825-0539 PT Time Calculation (min) (ACUTE ONLY): 14 min  Charges:  $Therapeutic Activity: 8-22 mins                    G Codes:       Lanney Gins, PT, DPT 01/10/18 2:17 PM Pager: [phone]

## 2018-01-10 NOTE — Progress Notes (Signed)
Results for Natalie Macdonald, TSOU (MRN [id number]) as of 01/10/2018 08:13  Ref. Range 01/10/2018 08:02  Glucose-Capillary Latest Ref Range: 70 - 99 mg/dL 68 (L)  220 Mls of juice consumed.

## 2018-01-10 NOTE — Progress Notes (Signed)
Nutrition Follow-up  DOCUMENTATION CODES:   Morbid obesity  INTERVENTION:  Continue Ensure Enlive po BID, each supplement provides 350 kcal and 20 grams of protein  Encouraged PO intake  Reviewed menu with patient  NUTRITION DIAGNOSIS:   Increased nutrient needs related to wound healing as evidenced by estimated needs. -ongoing  GOAL:   Patient will meet greater than or equal to 90% of their needs -unmet  MONITOR:   PO intake, Supplement acceptance, Labs, Diet advancement, Weight trends  REASON FOR ASSESSMENT:   Low Braden(stage III pressure ulcer)    ASSESSMENT:   62 yo female admitted with AMS with sepsis, pneumonia, ARF from [facility]. Required brief intubation on admission. Pt with hx of DM, HTN, HLD, sarcoidosis, vocal cord paralysis, CVA with left hemiparesis, stage III pressure ulcer   AKI and Sepsis have resolved. Receiving IV antibiotics. PO 25-45% Patient states "I'll eat when I go home. I don't like what I'm offered." Discussed everyday menu options with patient but she continued to complain and state "I'll eat when I feel like it." She is consuming the ensure provided. Discharging today.  Labs reviewed:  Na 134, Mag 1.5 CBGs 68, 110  Medications reviewed and include:  Insulin Morphine 500mg  KPhos, 40Meq K+ daily, Vitamin B6, Vitamin B12 NS at 26mL/hr, 2mg  IV Mag  Diet Order:   Diet Order           Diet - low sodium heart healthy        DIET SOFT Room service appropriate? Yes; Fluid consistency: Thin  Diet effective now          EDUCATION NEEDS:   No education needs have been identified at this time  Skin:  Skin Assessment: Skin Integrity Issues: Skin Integrity Issues:: Stage III Stage III: sacral with right and left gluteal fold (all connected)  Last BM:  no documented BM  Height:   Ht Readings from Last 1 Encounters:  01/03/18 5\' 6"  (1.676 m)    Weight:   Wt Readings from Last 1 Encounters:  01/10/18 247 lb 5.7 oz (112.2 kg)     Ideal Body Weight:  59 kg  BMI:  Body mass index is 39.92 kg/m.  Estimated Nutritional Needs:   Kcal:  2000-2200 kcals  Protein:  110-130 g  Fluid:  >/= 1.8 L    Natalie Anis. Jeannette Maddy, MS, RD LDN Inpatient Clinical Dietitian Pager [phone]

## 2018-01-10 NOTE — Progress Notes (Signed)
Notified patient and family that patient would need to remain in bed for 36min after line removal. Pt reported that they were unable to do so. Notified nurse Jocelyn Lamer RN that patient was not willing to stay the 85min. Risks explain to patient such as bleeding from site and getting an air embolism. Strongly encourage patient to remain for the 30 min to decrease risk of complications. HOB less than 45*. This nurse held pressure for 53min, with pressure drsg applied and instructed to keep drsg CDI for 24 hours and to monitor for any s/sx of bleeding and complications and report as appropriate. Pt VU. Fran Lowes, RN VAST

## 2018-01-11 ENCOUNTER — Encounter (HOSPITAL_COMMUNITY): Payer: Self-pay | Admitting: Neurosurgery

## 2018-01-29 ENCOUNTER — Encounter (HOSPITAL_BASED_OUTPATIENT_CLINIC_OR_DEPARTMENT_OTHER): Payer: Medicare Other | Attending: Internal Medicine

## 2018-01-29 DIAGNOSIS — I10 Essential (primary) hypertension: Secondary | ICD-10-CM | POA: Diagnosis not present

## 2018-01-29 DIAGNOSIS — L89150 Pressure ulcer of sacral region, unstageable: Secondary | ICD-10-CM | POA: Diagnosis present

## 2018-01-29 DIAGNOSIS — L89100 Pressure ulcer of unspecified part of back, unstageable: Secondary | ICD-10-CM | POA: Insufficient documentation

## 2018-01-29 DIAGNOSIS — M47812 Spondylosis without myelopathy or radiculopathy, cervical region: Secondary | ICD-10-CM | POA: Diagnosis not present

## 2018-01-29 DIAGNOSIS — E119 Type 2 diabetes mellitus without complications: Secondary | ICD-10-CM | POA: Insufficient documentation

## 2018-01-29 DIAGNOSIS — Z96653 Presence of artificial knee joint, bilateral: Secondary | ICD-10-CM | POA: Diagnosis not present

## 2018-01-29 DIAGNOSIS — L89899 Pressure ulcer of other site, unspecified stage: Secondary | ICD-10-CM | POA: Diagnosis not present

## 2018-01-29 DIAGNOSIS — L89320 Pressure ulcer of left buttock, unstageable: Secondary | ICD-10-CM | POA: Insufficient documentation

## 2018-02-10 ENCOUNTER — Emergency Department (HOSPITAL_COMMUNITY): Payer: Medicare Other

## 2018-02-10 ENCOUNTER — Inpatient Hospital Stay (HOSPITAL_COMMUNITY): Payer: Medicare Other

## 2018-02-10 ENCOUNTER — Inpatient Hospital Stay (HOSPITAL_COMMUNITY)
Admission: EM | Admit: 2018-02-10 | Discharge: 2018-02-26 | DRG: 871 | Discharge status: Against Medical Advice | Payer: Medicare Other | Attending: Family Medicine | Admitting: Family Medicine

## 2018-02-10 ENCOUNTER — Other Ambulatory Visit: Payer: Self-pay

## 2018-02-10 ENCOUNTER — Encounter (HOSPITAL_COMMUNITY): Payer: Self-pay | Admitting: Emergency Medicine

## 2018-02-10 DIAGNOSIS — J3800 Paralysis of vocal cords and larynx, unspecified: Secondary | ICD-10-CM | POA: Diagnosis present

## 2018-02-10 DIAGNOSIS — A4102 Sepsis due to Methicillin resistant Staphylococcus aureus: Principal | ICD-10-CM | POA: Diagnosis present

## 2018-02-10 DIAGNOSIS — Z79899 Other long term (current) drug therapy: Secondary | ICD-10-CM

## 2018-02-10 DIAGNOSIS — E860 Dehydration: Secondary | ICD-10-CM | POA: Diagnosis present

## 2018-02-10 DIAGNOSIS — M24542 Contracture, left hand: Secondary | ICD-10-CM | POA: Diagnosis not present

## 2018-02-10 DIAGNOSIS — E11649 Type 2 diabetes mellitus with hypoglycemia without coma: Secondary | ICD-10-CM | POA: Diagnosis present

## 2018-02-10 DIAGNOSIS — L89620 Pressure ulcer of left heel, unstageable: Secondary | ICD-10-CM | POA: Diagnosis present

## 2018-02-10 DIAGNOSIS — M20092 Other deformity of left finger(s): Secondary | ICD-10-CM | POA: Diagnosis present

## 2018-02-10 DIAGNOSIS — A4150 Gram-negative sepsis, unspecified: Secondary | ICD-10-CM | POA: Diagnosis present

## 2018-02-10 DIAGNOSIS — K5900 Constipation, unspecified: Secondary | ICD-10-CM | POA: Diagnosis present

## 2018-02-10 DIAGNOSIS — I10 Essential (primary) hypertension: Secondary | ICD-10-CM | POA: Diagnosis present

## 2018-02-10 DIAGNOSIS — M86672 Other chronic osteomyelitis, left ankle and foot: Secondary | ICD-10-CM | POA: Diagnosis present

## 2018-02-10 DIAGNOSIS — L89153 Pressure ulcer of sacral region, stage 3: Secondary | ICD-10-CM | POA: Diagnosis present

## 2018-02-10 DIAGNOSIS — E1169 Type 2 diabetes mellitus with other specified complication: Secondary | ICD-10-CM | POA: Diagnosis present

## 2018-02-10 DIAGNOSIS — M19011 Primary osteoarthritis, right shoulder: Secondary | ICD-10-CM | POA: Diagnosis present

## 2018-02-10 DIAGNOSIS — R1013 Epigastric pain: Secondary | ICD-10-CM | POA: Diagnosis not present

## 2018-02-10 DIAGNOSIS — Z794 Long term (current) use of insulin: Secondary | ICD-10-CM

## 2018-02-10 DIAGNOSIS — E890 Postprocedural hypothyroidism: Secondary | ICD-10-CM | POA: Diagnosis present

## 2018-02-10 DIAGNOSIS — Z981 Arthrodesis status: Secondary | ICD-10-CM

## 2018-02-10 DIAGNOSIS — E11621 Type 2 diabetes mellitus with foot ulcer: Secondary | ICD-10-CM | POA: Diagnosis present

## 2018-02-10 DIAGNOSIS — L89154 Pressure ulcer of sacral region, stage 4: Secondary | ICD-10-CM | POA: Diagnosis present

## 2018-02-10 DIAGNOSIS — Z91040 Latex allergy status: Secondary | ICD-10-CM

## 2018-02-10 DIAGNOSIS — M797 Fibromyalgia: Secondary | ICD-10-CM | POA: Diagnosis present

## 2018-02-10 DIAGNOSIS — D869 Sarcoidosis, unspecified: Secondary | ICD-10-CM | POA: Diagnosis present

## 2018-02-10 DIAGNOSIS — N200 Calculus of kidney: Secondary | ICD-10-CM | POA: Diagnosis present

## 2018-02-10 DIAGNOSIS — A419 Sepsis, unspecified organism: Secondary | ICD-10-CM | POA: Diagnosis not present

## 2018-02-10 DIAGNOSIS — R6521 Severe sepsis with septic shock: Secondary | ICD-10-CM | POA: Diagnosis present

## 2018-02-10 DIAGNOSIS — E1165 Type 2 diabetes mellitus with hyperglycemia: Secondary | ICD-10-CM | POA: Diagnosis not present

## 2018-02-10 DIAGNOSIS — G92 Toxic encephalopathy: Secondary | ICD-10-CM | POA: Diagnosis present

## 2018-02-10 DIAGNOSIS — E119 Type 2 diabetes mellitus without complications: Secondary | ICD-10-CM

## 2018-02-10 DIAGNOSIS — E11610 Type 2 diabetes mellitus with diabetic neuropathic arthropathy: Secondary | ICD-10-CM | POA: Diagnosis present

## 2018-02-10 DIAGNOSIS — R4182 Altered mental status, unspecified: Secondary | ICD-10-CM | POA: Diagnosis not present

## 2018-02-10 DIAGNOSIS — I447 Left bundle-branch block, unspecified: Secondary | ICD-10-CM | POA: Diagnosis present

## 2018-02-10 DIAGNOSIS — Z96653 Presence of artificial knee joint, bilateral: Secondary | ICD-10-CM | POA: Diagnosis present

## 2018-02-10 DIAGNOSIS — Z791 Long term (current) use of non-steroidal anti-inflammatories (NSAID): Secondary | ICD-10-CM

## 2018-02-10 DIAGNOSIS — E118 Type 2 diabetes mellitus with unspecified complications: Secondary | ICD-10-CM | POA: Diagnosis not present

## 2018-02-10 DIAGNOSIS — G929 Unspecified toxic encephalopathy: Secondary | ICD-10-CM | POA: Diagnosis present

## 2018-02-10 DIAGNOSIS — B3749 Other urogenital candidiasis: Secondary | ICD-10-CM | POA: Diagnosis present

## 2018-02-10 DIAGNOSIS — Z800 Family history of malignant neoplasm of digestive organs: Secondary | ICD-10-CM

## 2018-02-10 DIAGNOSIS — E876 Hypokalemia: Secondary | ICD-10-CM | POA: Diagnosis present

## 2018-02-10 DIAGNOSIS — L89123 Pressure ulcer of left upper back, stage 3: Secondary | ICD-10-CM | POA: Diagnosis present

## 2018-02-10 DIAGNOSIS — M4628 Osteomyelitis of vertebra, sacral and sacrococcygeal region: Secondary | ICD-10-CM | POA: Diagnosis present

## 2018-02-10 DIAGNOSIS — E1159 Type 2 diabetes mellitus with other circulatory complications: Secondary | ICD-10-CM | POA: Diagnosis not present

## 2018-02-10 DIAGNOSIS — Z5321 Procedure and treatment not carried out due to patient leaving prior to being seen by health care provider: Secondary | ICD-10-CM | POA: Diagnosis present

## 2018-02-10 DIAGNOSIS — L89890 Pressure ulcer of other site, unstageable: Secondary | ICD-10-CM | POA: Diagnosis present

## 2018-02-10 DIAGNOSIS — Z79890 Hormone replacement therapy (postmenopausal): Secondary | ICD-10-CM

## 2018-02-10 DIAGNOSIS — G8929 Other chronic pain: Secondary | ICD-10-CM | POA: Diagnosis present

## 2018-02-10 DIAGNOSIS — I69354 Hemiplegia and hemiparesis following cerebral infarction affecting left non-dominant side: Secondary | ICD-10-CM

## 2018-02-10 DIAGNOSIS — E11628 Type 2 diabetes mellitus with other skin complications: Secondary | ICD-10-CM | POA: Diagnosis not present

## 2018-02-10 DIAGNOSIS — E669 Obesity, unspecified: Secondary | ICD-10-CM | POA: Diagnosis present

## 2018-02-10 DIAGNOSIS — G8194 Hemiplegia, unspecified affecting left nondominant side: Secondary | ICD-10-CM | POA: Diagnosis not present

## 2018-02-10 DIAGNOSIS — D638 Anemia in other chronic diseases classified elsewhere: Secondary | ICD-10-CM | POA: Diagnosis present

## 2018-02-10 DIAGNOSIS — K219 Gastro-esophageal reflux disease without esophagitis: Secondary | ICD-10-CM | POA: Diagnosis present

## 2018-02-10 DIAGNOSIS — Z993 Dependence on wheelchair: Secondary | ICD-10-CM

## 2018-02-10 DIAGNOSIS — Z884 Allergy status to anesthetic agent status: Secondary | ICD-10-CM

## 2018-02-10 DIAGNOSIS — Z888 Allergy status to other drugs, medicaments and biological substances status: Secondary | ICD-10-CM

## 2018-02-10 DIAGNOSIS — N179 Acute kidney failure, unspecified: Secondary | ICD-10-CM | POA: Diagnosis present

## 2018-02-10 DIAGNOSIS — Z6839 Body mass index (BMI) 39.0-39.9, adult: Secondary | ICD-10-CM

## 2018-02-10 DIAGNOSIS — Z9102 Food additives allergy status: Secondary | ICD-10-CM

## 2018-02-10 DIAGNOSIS — L97528 Non-pressure chronic ulcer of other part of left foot with other specified severity: Secondary | ICD-10-CM | POA: Diagnosis present

## 2018-02-10 DIAGNOSIS — Z7401 Bed confinement status: Secondary | ICD-10-CM

## 2018-02-10 DIAGNOSIS — Z9049 Acquired absence of other specified parts of digestive tract: Secondary | ICD-10-CM

## 2018-02-10 DIAGNOSIS — Z9089 Acquired absence of other organs: Secondary | ICD-10-CM

## 2018-02-10 DIAGNOSIS — M19012 Primary osteoarthritis, left shoulder: Secondary | ICD-10-CM | POA: Diagnosis present

## 2018-02-10 DIAGNOSIS — Z792 Long term (current) use of antibiotics: Secondary | ICD-10-CM

## 2018-02-10 DIAGNOSIS — E11622 Type 2 diabetes mellitus with other skin ulcer: Secondary | ICD-10-CM | POA: Diagnosis not present

## 2018-02-10 DIAGNOSIS — R0689 Other abnormalities of breathing: Secondary | ICD-10-CM | POA: Diagnosis not present

## 2018-02-10 DIAGNOSIS — Z8614 Personal history of Methicillin resistant Staphylococcus aureus infection: Secondary | ICD-10-CM

## 2018-02-10 DIAGNOSIS — E785 Hyperlipidemia, unspecified: Secondary | ICD-10-CM | POA: Diagnosis present

## 2018-02-10 DIAGNOSIS — Z79891 Long term (current) use of opiate analgesic: Secondary | ICD-10-CM

## 2018-02-10 DIAGNOSIS — Z8619 Personal history of other infectious and parasitic diseases: Secondary | ICD-10-CM

## 2018-02-10 LAB — COMPREHENSIVE METABOLIC PANEL
ALBUMIN: 1.8 g/dL — AB (ref 3.5–5.0)
ALT: 27 U/L (ref 0–44)
AST: 38 U/L (ref 15–41)
Alkaline Phosphatase: 129 U/L — ABNORMAL HIGH (ref 38–126)
Anion gap: 17 — ABNORMAL HIGH (ref 5–15)
BILIRUBIN TOTAL: 0.4 mg/dL (ref 0.3–1.2)
BUN: 41 mg/dL — ABNORMAL HIGH (ref 8–23)
CO2: 24 mmol/L (ref 22–32)
CREATININE: 4.21 mg/dL — AB (ref 0.44–1.00)
Calcium: 8.1 mg/dL — ABNORMAL LOW (ref 8.9–10.3)
Chloride: 90 mmol/L — ABNORMAL LOW (ref 98–111)
GFR calc Af Amer: 12 mL/min — ABNORMAL LOW (ref >60)
GFR, EST NON AFRICAN AMERICAN: 10 mL/min — AB (ref >60)
GLUCOSE: 191 mg/dL — AB (ref 70–99)
POTASSIUM: 4.2 mmol/L (ref 3.5–5.1)
Sodium: 131 mmol/L — ABNORMAL LOW (ref 135–145)
TOTAL PROTEIN: 6.7 g/dL (ref 6.5–8.1)

## 2018-02-10 LAB — URINALYSIS, ROUTINE W REFLEX MICROSCOPIC
Bilirubin Urine: NEGATIVE
Glucose, UA: NEGATIVE mg/dL
Hgb urine dipstick: NEGATIVE
KETONES UR: NEGATIVE mg/dL
Nitrite: NEGATIVE
PH: 5 (ref 5.0–8.0)
PROTEIN: NEGATIVE mg/dL
Specific Gravity, Urine: 1.019 (ref 1.005–1.030)

## 2018-02-10 LAB — CBC WITH DIFFERENTIAL/PLATELET
Basophils Absolute: 0 10*3/uL (ref 0.0–0.1)
Basophils Relative: 0 %
EOS ABS: 0.1 10*3/uL (ref 0.0–0.7)
EOS PCT: 1 %
HEMATOCRIT: 31.3 % — AB (ref 36.0–46.0)
Hemoglobin: 10.3 g/dL — ABNORMAL LOW (ref 12.0–15.0)
LYMPHS PCT: 15 %
Lymphs Abs: 3.6 10*3/uL (ref 0.7–4.0)
MCH: 28.3 pg (ref 26.0–34.0)
MCHC: 32.9 g/dL (ref 30.0–36.0)
MCV: 86 fL (ref 78.0–100.0)
MONO ABS: 1.6 10*3/uL (ref 0.1–1.0)
Monocytes Relative: 7 %
Neutro Abs: 18.5 10*3/uL (ref 1.7–7.7)
Neutrophils Relative %: 77 %
PLATELETS: 923 10*3/uL — AB (ref 150–400)
RBC: 3.64 MIL/uL — ABNORMAL LOW (ref 3.87–5.11)
RDW: 15.6 % — ABNORMAL HIGH (ref 11.5–15.5)
WBC: 23.8 10*3/uL — ABNORMAL HIGH (ref 4.0–10.5)

## 2018-02-10 LAB — GLUCOSE, CAPILLARY
GLUCOSE-CAPILLARY: 119 mg/dL — AB (ref 70–99)
Glucose-Capillary: 41 mg/dL — CL (ref 70–99)
Glucose-Capillary: 44 mg/dL — CL (ref 70–99)
Glucose-Capillary: 116 mg/dL — ABNORMAL HIGH (ref 70–99)
Glucose-Capillary: 141 mg/dL — ABNORMAL HIGH (ref 70–99)
Glucose-Capillary: 214 mg/dL — ABNORMAL HIGH (ref 70–99)

## 2018-02-10 LAB — I-STAT CG4 LACTIC ACID, ED
LACTIC ACID, VENOUS: 3.56 mmol/L — AB (ref 0.5–1.9)
LACTIC ACID, VENOUS: 2.2 mmol/L — AB (ref 0.5–1.9)

## 2018-02-10 LAB — TROPONIN I

## 2018-02-10 MED ORDER — VITAMIN B-12 1000 MCG PO TABS
1000.0000 ug | ORAL_TABLET | Freq: Every day | ORAL | Status: DC
  Administered 2018-02-10 – 2018-02-26 (×17): 1000 ug via ORAL
  Filled 2018-02-10 (×17): qty 1

## 2018-02-10 MED ORDER — ACETAMINOPHEN 325 MG PO TABS
650.0000 mg | ORAL_TABLET | Freq: Four times a day (QID) | ORAL | Status: DC | PRN
  Administered 2018-02-10 – 2018-02-26 (×11): 650 mg via ORAL
  Filled 2018-02-10 (×12): qty 2

## 2018-02-10 MED ORDER — SODIUM CHLORIDE 0.9 % IV BOLUS
1000.0000 mL | Freq: Once | INTRAVENOUS | Status: AC
  Administered 2018-02-10: 1000 mL via INTRAVENOUS

## 2018-02-10 MED ORDER — NOREPINEPHRINE 4 MG/250ML-% IV SOLN
0.0000 ug/min | INTRAVENOUS | Status: DC
  Filled 2018-02-10: qty 250

## 2018-02-10 MED ORDER — PIPERACILLIN-TAZOBACTAM IN DEX 2-0.25 GM/50ML IV SOLN
2.2500 g | Freq: Four times a day (QID) | INTRAVENOUS | Status: DC
  Administered 2018-02-10 – 2018-02-11 (×4): 2.25 g via INTRAVENOUS
  Filled 2018-02-10 (×5): qty 50

## 2018-02-10 MED ORDER — VANCOMYCIN HCL IN DEXTROSE 1-5 GM/200ML-% IV SOLN: 1000.0000 mg | Freq: Once | INTRAVENOUS | Status: DC

## 2018-02-10 MED ORDER — PRAVASTATIN SODIUM 20 MG PO TABS
20.0000 mg | ORAL_TABLET | Freq: Every day | ORAL | Status: DC
  Administered 2018-02-10 – 2018-02-26 (×17): 20 mg via ORAL
  Filled 2018-02-10 (×17): qty 1

## 2018-02-10 MED ORDER — DEXTROSE 50 % IV SOLN
INTRAVENOUS | Status: AC
  Administered 2018-02-10: 50 mL
  Filled 2018-02-10: qty 50

## 2018-02-10 MED ORDER — SODIUM CHLORIDE 0.9 % IV BOLUS (SEPSIS)
1000.0000 mL | Freq: Once | INTRAVENOUS | Status: AC
  Administered 2018-02-10: 1000 mL via INTRAVENOUS

## 2018-02-10 MED ORDER — ACETAMINOPHEN 650 MG RE SUPP: 650.0000 mg | Freq: Four times a day (QID) | RECTAL | Status: DC | PRN

## 2018-02-10 MED ORDER — ONDANSETRON HCL 4 MG PO TABS: 4.0000 mg | ORAL_TABLET | Freq: Four times a day (QID) | ORAL | Status: DC | PRN

## 2018-02-10 MED ORDER — ADULT MULTIVITAMIN W/MINERALS CH
1.0000 | ORAL_TABLET | Freq: Every day | ORAL | Status: DC
  Administered 2018-02-10 – 2018-02-26 (×17): 1 via ORAL
  Filled 2018-02-10 (×18): qty 1

## 2018-02-10 MED ORDER — SODIUM CHLORIDE 0.9 % IV SOLN
INTRAVENOUS | Status: DC
  Administered 2018-02-10: 07:00:00 via INTRAVENOUS

## 2018-02-10 MED ORDER — ALBUMIN HUMAN 25 % IV SOLN
25.0000 g | Freq: Once | INTRAVENOUS | Status: AC
  Administered 2018-02-10: 25 g via INTRAVENOUS
  Filled 2018-02-10: qty 100

## 2018-02-10 MED ORDER — OXYBUTYNIN CHLORIDE ER 5 MG PO TB24
15.0000 mg | ORAL_TABLET | Freq: Every day | ORAL | Status: DC
  Administered 2018-02-10 – 2018-02-26 (×17): 15 mg via ORAL
  Filled 2018-02-10 (×5): qty 3
  Filled 2018-02-10 (×2): qty 1
  Filled 2018-02-10 (×4): qty 3
  Filled 2018-02-10: qty 1
  Filled 2018-02-10 (×3): qty 3
  Filled 2018-02-10: qty 1
  Filled 2018-02-10 (×3): qty 3

## 2018-02-10 MED ORDER — POLYETHYLENE GLYCOL 3350 17 G PO PACK
17.0000 g | PACK | Freq: Two times a day (BID) | ORAL | Status: DC
  Administered 2018-02-10 – 2018-02-26 (×12): 17 g via ORAL
  Filled 2018-02-10 (×24): qty 1

## 2018-02-10 MED ORDER — INSULIN GLARGINE 100 UNIT/ML ~~LOC~~ SOLN
10.0000 [IU] | Freq: Every day | SUBCUTANEOUS | Status: DC
  Administered 2018-02-10: 10 [IU] via SUBCUTANEOUS
  Filled 2018-02-10: qty 0.1

## 2018-02-10 MED ORDER — OXYCODONE HCL 5 MG PO TABS
5.0000 mg | ORAL_TABLET | Freq: Four times a day (QID) | ORAL | Status: AC | PRN
  Administered 2018-02-10 – 2018-02-11 (×2): 5 mg via ORAL
  Filled 2018-02-10 (×2): qty 1

## 2018-02-10 MED ORDER — ACETAMINOPHEN 500 MG PO TABS
1000.0000 mg | ORAL_TABLET | Freq: Once | ORAL | Status: AC
  Administered 2018-02-10: 1000 mg via ORAL
  Filled 2018-02-10: qty 2

## 2018-02-10 MED ORDER — INSULIN REGULAR HUMAN 100 UNIT/ML IJ SOLN
0.0000 [IU] | Freq: Every day | INTRAMUSCULAR | Status: DC
  Administered 2018-02-16 – 2018-02-24 (×3): 2 [IU] via SUBCUTANEOUS
  Filled 2018-02-10: qty 0.05

## 2018-02-10 MED ORDER — VITAMIN E 180 MG (400 UNIT) PO CAPS
400.0000 [IU] | ORAL_CAPSULE | Freq: Every day | ORAL | Status: DC
  Administered 2018-02-10 – 2018-02-26 (×17): 400 [IU] via ORAL
  Filled 2018-02-10 (×18): qty 1

## 2018-02-10 MED ORDER — SODIUM CHLORIDE 0.9 % IV BOLUS (SEPSIS)
500.0000 mL | Freq: Once | INTRAVENOUS | Status: AC
  Administered 2018-02-10: 500 mL via INTRAVENOUS

## 2018-02-10 MED ORDER — ENOXAPARIN SODIUM 30 MG/0.3ML ~~LOC~~ SOLN
30.0000 mg | SUBCUTANEOUS | Status: DC
  Administered 2018-02-10: 30 mg via SUBCUTANEOUS
  Filled 2018-02-10: qty 0.3

## 2018-02-10 MED ORDER — LEVOTHYROXINE SODIUM 75 MCG PO TABS
150.0000 ug | ORAL_TABLET | Freq: Every day | ORAL | Status: DC
  Administered 2018-02-10 – 2018-02-26 (×17): 150 ug via ORAL
  Filled 2018-02-10: qty 2
  Filled 2018-02-10: qty 1
  Filled 2018-02-10 (×4): qty 2
  Filled 2018-02-10: qty 6
  Filled 2018-02-10 (×3): qty 2
  Filled 2018-02-10: qty 1
  Filled 2018-02-10: qty 2
  Filled 2018-02-10: qty 1
  Filled 2018-02-10 (×8): qty 2

## 2018-02-10 MED ORDER — INSULIN REGULAR HUMAN 100 UNIT/ML IJ SOLN
0.0000 [IU] | Freq: Three times a day (TID) | INTRAMUSCULAR | Status: DC
  Administered 2018-02-10: 1 [IU] via SUBCUTANEOUS
  Administered 2018-02-10: 3 [IU] via SUBCUTANEOUS
  Administered 2018-02-12: 1 [IU] via SUBCUTANEOUS
  Administered 2018-02-13: 2 [IU] via SUBCUTANEOUS
  Administered 2018-02-13: 1 [IU] via SUBCUTANEOUS
  Administered 2018-02-13: 2 [IU] via SUBCUTANEOUS
  Administered 2018-02-14: 3 [IU] via SUBCUTANEOUS
  Administered 2018-02-14: 2 [IU] via SUBCUTANEOUS
  Administered 2018-02-15: 3 [IU] via SUBCUTANEOUS
  Administered 2018-02-15 (×2): 2 [IU] via SUBCUTANEOUS
  Administered 2018-02-16: 3 [IU] via SUBCUTANEOUS
  Administered 2018-02-16 – 2018-02-17 (×2): 2 [IU] via SUBCUTANEOUS
  Administered 2018-02-17 (×2): 3 [IU] via SUBCUTANEOUS
  Administered 2018-02-18 (×2): 2 [IU] via SUBCUTANEOUS
  Administered 2018-02-18: 1 [IU] via SUBCUTANEOUS
  Administered 2018-02-19: 2 [IU] via SUBCUTANEOUS
  Administered 2018-02-19: 1 [IU] via SUBCUTANEOUS
  Administered 2018-02-20 (×2): 2 [IU] via SUBCUTANEOUS
  Administered 2018-02-21: 3 [IU] via SUBCUTANEOUS
  Administered 2018-02-21 – 2018-02-22 (×3): 2 [IU] via SUBCUTANEOUS
  Administered 2018-02-22 (×2): 3 [IU] via SUBCUTANEOUS
  Administered 2018-02-23: 2 [IU] via SUBCUTANEOUS
  Administered 2018-02-23: 1 [IU] via SUBCUTANEOUS
  Administered 2018-02-23: 2 [IU] via SUBCUTANEOUS
  Administered 2018-02-24 (×2): 3 [IU] via SUBCUTANEOUS
  Administered 2018-02-24 – 2018-02-25 (×4): 2 [IU] via SUBCUTANEOUS
  Administered 2018-02-26: 3 [IU] via SUBCUTANEOUS
  Administered 2018-02-26: 2 [IU] via SUBCUTANEOUS
  Administered 2018-02-26: 3 [IU] via SUBCUTANEOUS

## 2018-02-10 MED ORDER — SODIUM CHLORIDE 0.9 % IV SOLN: INTRAVENOUS | Status: DC | PRN

## 2018-02-10 MED ORDER — IOHEXOL 300 MG/ML  SOLN
30.0000 mL | Freq: Once | INTRAMUSCULAR | Status: AC | PRN
  Administered 2018-02-10: 30 mL via ORAL

## 2018-02-10 MED ORDER — PANTOPRAZOLE SODIUM 40 MG PO TBEC
40.0000 mg | DELAYED_RELEASE_TABLET | Freq: Every day | ORAL | Status: DC
  Administered 2018-02-10 – 2018-02-12 (×3): 40 mg via ORAL
  Filled 2018-02-10 (×3): qty 1

## 2018-02-10 MED ORDER — VANCOMYCIN VARIABLE DOSE PER UNSTABLE RENAL FUNCTION (PHARMACIST DOSING): Status: DC

## 2018-02-10 MED ORDER — ONDANSETRON HCL 4 MG/2ML IJ SOLN
4.0000 mg | Freq: Four times a day (QID) | INTRAMUSCULAR | Status: DC | PRN
  Administered 2018-02-12 – 2018-02-19 (×4): 4 mg via INTRAVENOUS
  Filled 2018-02-10 (×4): qty 2

## 2018-02-10 MED ORDER — AMITRIPTYLINE HCL 25 MG PO TABS
100.0000 mg | ORAL_TABLET | Freq: Every day | ORAL | Status: DC
  Administered 2018-02-10 – 2018-02-25 (×16): 100 mg via ORAL
  Filled 2018-02-10 (×16): qty 4

## 2018-02-10 MED ORDER — VITAMIN B-6 100 MG PO TABS
250.0000 mg | ORAL_TABLET | Freq: Every day | ORAL | Status: DC
  Administered 2018-02-10 – 2018-02-26 (×17): 250 mg via ORAL
  Filled 2018-02-10 (×19): qty 1

## 2018-02-10 MED ORDER — VANCOMYCIN HCL 10 G IV SOLR
2000.0000 mg | Freq: Once | INTRAVENOUS | Status: AC
  Administered 2018-02-10: 2000 mg via INTRAVENOUS
  Filled 2018-02-10: qty 2000

## 2018-02-10 MED ORDER — PIPERACILLIN-TAZOBACTAM 3.375 G IVPB 30 MIN
3.3750 g | Freq: Once | INTRAVENOUS | Status: AC
  Administered 2018-02-10: 3.375 g via INTRAVENOUS
  Filled 2018-02-10: qty 50

## 2018-02-10 MED ORDER — SODIUM CHLORIDE 0.9 % IV SOLN
INTRAVENOUS | Status: DC | PRN
  Administered 2018-02-10: 250 mL via INTRAVENOUS

## 2018-02-10 NOTE — ED Provider Notes (Signed)
[facility] DEPT Provider Note   CSN: [id number] Arrival date & time: 02/10/18  0201     History   Chief Complaint Chief Complaint  Patient presents with  . Altered Mental Status   Level 5 caveat: Altered mental status HPI Natalie Macdonald is a 62 y.o. female.  HPI 62 year old female brought to the emergency department for confusion and altered mental status over the past 24 hours with decreased oral intake.   Known stage IV decubitus ulcer.  No reports of cough.  No documented fever at home but sounds like she had Reiger's.  Husband reports none of her medications in the past 2 days.  She was recently hospitalized for sepsis thought to be secondary to skin infection.  Husband reports no recent diarrhea.   Past Medical History:  Diagnosis Date  . Allergic rhinitis   . Arthritis    knees, shoulder  . Bronchitis    history  . Cellulitis of left foot   . Cervical disc disorder   . 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 ([place])   . Chronic osteomyelitis ([place])    left foot  . Chronic respiratory infection   . Diabetes mellitus without complication ([place])   . Diabetes mellitus, type II ([place])   . Diabetic foot ulcer ([place])    left foot  . Fibromyalgia   . GERD (gastroesophageal reflux disease)   . Headache    sinus  . History of blood transfusion   . Hypertension   . Hyponatremia   . hypothyroid   . Hypothyroidism   . Joint pain    bilateral legs  . Left hemiplegia ([place])   . Sarcoidosis   . Sepsis ([place])    HX  . Sepsis ([place])   . Uterine fibroid    hx  . Uterine fibroid     Patient Active Problem List   Diagnosis Date Noted  . Diarrhea 01/06/2018  . Sepsis ([place]) 01/03/2018  . Cellulitis 01/03/2018  . Sacral decubitus ulcer, stage III ([place]) 01/03/2018  . AKI (acute kidney injury) ([place]) 01/03/2018  . Dehydration 01/03/2018  . Toxic encephalopathy 01/03/2018  . Type 2 diabetes mellitus ([place]) 01/03/2018  . Adverse drug  effect 01/03/2018  . Acute encephalopathy   . Hypoxemia   . Palliative care encounter   . Septic shock ([place])   . Acute respiratory failure with hypoxemia ([place])   . Pressure injury of skin 10/18/2016  . Cervical spondylosis with myelopathy 10/17/2016  . Chronic left-sided low back pain with left-sided sciatica 09/05/2016  . Cervicalgia 09/05/2016  . Diabetes mellitus type 2 with complications ([place]) 02/54/2706  . Hyperlipidemia, unspecified 09/05/2016  . Hypertension 09/05/2016  . Sarcoidosis 09/05/2016  . Cervical disc disorder with myelopathy of mid-cervical region 03/13/2016  . Left hemiplegia ([place]) 02/07/2016  . Chronic osteomyelitis of ankle and foot, left ([place]) 09/25/2014  . Hyponatremia 09/25/2014  . Diabetic ulcer of left foot associated with type 2 diabetes mellitus ([place]) 08/30/2014  . Charcot's joint of foot due to diabetes ([place]) 10/15/2013  . Pain in joint involving ankle and foot 10/15/2013    Past Surgical History:  Procedure Laterality Date  . ANTERIOR CERVICAL DECOMP/DISCECTOMY FUSION N/A 10/17/2016   Procedure: ANTERIOR CERVICAL DECOMPRESSION/DISCECTOMY FUSION CERVICAL FIVE- CERVICAL SIX, CERVICAL SIX- CERVICAL SEVEN, POSSIBLE CERVICAL SIX CORPECTOMY;  Surgeon: Consuella Lose, MD;  Location: [facility];  Service: Neurosurgery;  Laterality: N/A;  . ANTERIOR CERVICAL DECOMP/DISCECTOMY FUSION    . APPLICATION OF WOUND VAC Left  foot  . CHOLECYSTECTOMY    . FOOT SURGERY Bilateral    implants  . IRRIGATION AND DEBRIDEMENT FOOT Left    X2  . KNEE ARTHROPLASTY    . THYROIDECTOMY    . UTERINE FIBROID SURGERY       OB History   None      Home Medications    Prior to Admission medications   Medication Sig Start Date End Date Taking? Authorizing Provider  amitriptyline (ELAVIL) 100 MG tablet Take 100 mg by mouth at bedtime.    Yes [provider]  amLODipine-benazepril (LOTREL) 5-40 MG capsule Take 1 capsule by mouth daily.   Yes [provider]    APIDRA 100 UNIT/ML injection Inject 18-25 Units into the skin 3 (three) times daily with meals. Sliding Scale 12/13/17  Yes [provider]  baclofen (LIORESAL) 10 MG tablet Take 10 mg by mouth 3 (three) times daily as needed for muscle spasms.  10/03/16  Yes [provider]  Calcium Carbonate (CALCIUM 600 PO) Take 600 mg by mouth daily.   Yes [provider]  carvedilol (COREG) 25 MG tablet Take 25 mg by mouth 2 (two) times daily with a meal.   Yes [provider]  cetirizine (ZYRTEC) 10 MG tablet Take 10 mg by mouth daily as needed for allergies.    Yes [provider]  chlorthalidone (HYGROTON) 25 MG tablet Take 25 mg by mouth daily.   Yes [provider]  CRANBERRY PO Take 2 capsules by mouth daily.   Yes [provider]  diclofenac (VOLTAREN) 75 MG EC tablet Take 75 mg by mouth 2 (two) times daily. 12/05/17  Yes [provider]  doxycycline (DORYX) 100 MG EC tablet Take 100 mg by mouth 2 (two) times daily. 01/24/18  Yes [provider]  DULoxetine (CYMBALTA) 30 MG capsule Take 60 mg by mouth daily. 11/25/17  Yes [provider]  fluticasone (FLONASE) 50 MCG/ACT nasal spray Place 2 sprays into both nostrils daily as needed for allergies.    Yes [provider]  gabapentin (NEURONTIN) 300 MG capsule Take 300 mg by mouth 3 (three) times daily as needed (for pain).    Yes [provider]  levothyroxine (SYNTHROID, LEVOTHROID) 150 MCG tablet Take 150 mcg by mouth daily before breakfast.   Yes [provider]  metFORMIN (GLUCOPHAGE) 500 MG tablet Take 500 mg by mouth 2 (two) times daily with a meal.   Yes [provider]  morphine (MS CONTIN) 30 MG 12 hr tablet Take 30 mg by mouth 2 (two) times daily. 10/30/17  Yes [provider]  Multiple Vitamin (MULTIVITAMIN) tablet Take 1 tablet by mouth daily.   Yes [provider]  oxybutynin (DITROPAN XL) 15 MG 24 hr tablet  Take 15 mg by mouth daily.  11/06/17  Yes [provider]  Oxycodone HCl 10 MG TABS Take 10 mg by mouth every 8 (eight) hours as needed (for pain).  10/04/16  Yes [provider]  pantoprazole (PROTONIX) 40 MG tablet Take 40 mg by mouth daily.   Yes [provider]  pravastatin (PRAVACHOL) 20 MG tablet Take 20 mg by mouth daily.   Yes [provider]  Probiotic Product (PROBIOTIC PO) Take 2 capsules by mouth daily.   Yes [provider]  Pyridoxine HCl (VITAMIN B-6) 250 MG tablet Take 250 mg by mouth daily.   Yes [provider]  ranitidine (ZANTAC) 75 MG tablet Take 150 mg by mouth  daily as needed for heartburn.   Yes [provider]  silver sulfADIAZINE (SILVADENE) 1 % cream Apply 1 application topically 2 (two) times daily.  01/15/18  Yes [provider]  tiZANidine (ZANAFLEX) 4 MG tablet Take 4 mg by mouth every 8 (eight) hours as needed for muscle spasms.  12/05/17  Yes [provider]  TOUJEO SOLOSTAR 300 UNIT/ML SOPN Inject 40-50 Units into the skin at bedtime. Sliding Scale 10/30/17  Yes [provider]  vitamin B-12 (CYANOCOBALAMIN) 1000 MCG tablet Take 1,000 mcg by mouth daily.   Yes [provider]  vitamin E 400 UNIT capsule Take 400 Units by mouth daily.   Yes [provider]    Family History No family history on file.  Social History Social History   Tobacco Use  . Smoking status: Never Smoker  . Smokeless tobacco: Never Used  Substance Use Topics  . Alcohol use: No  . Drug use: No     Allergies   Grapefruit bioflavonoid complex; Latex; Procaine; Procaine; Grapefruit flavor [flavoring agent]; and Novolog [insulin aspart]   Review of Systems Review of Systems  Unable to perform ROS: Mental status change     Physical Exam Updated Vital Signs BP (!) 86/45   Pulse (!) 108   Temp (!) 100.6 F (38.1 C) (Rectal) Comment: MD aware  Resp 13   LMP  (LMP Unknown)    SpO2 100%   Physical Exam  Constitutional: She appears well-developed and well-nourished. No distress.  HENT:  Head: Normocephalic and atraumatic.  Eyes: Pupils are equal, round, and reactive to light. EOM are normal.  Neck: Normal range of motion. Neck supple.  Cardiovascular: Normal rate, regular rhythm and normal heart sounds.  Pulmonary/Chest: Effort normal and breath sounds normal.  Abdominal: Soft. She exhibits no distension. There is no tenderness.  Neurological: She is alert.  Moves all 4 extremities  Skin: Skin is warm and dry.  Stage IV sacral decubitus with surrounding erythema.  Psychiatric:  Unable to test  Nursing note and vitals reviewed.    ED Treatments / Results  Labs (all labs ordered are listed, but only abnormal results are displayed) Labs Reviewed  COMPREHENSIVE METABOLIC PANEL - Abnormal; Notable for the following components:      Result Value   Sodium 131 (*)    Chloride 90 (*)    Glucose, Bld 191 (*)    BUN 41 (*)    Creatinine, Ser 4.21 (*)    Calcium 8.1 (*)    Albumin 1.8 (*)    Alkaline Phosphatase 129 (*)    GFR calc non Af Amer 10 (*)    GFR calc Af Amer 12 (*)    Anion gap 17 (*)    All other components within normal limits  CBC WITH DIFFERENTIAL/PLATELET - Abnormal; Notable for the following components:   WBC 23.8 (*)    RBC 3.64 (*)    Hemoglobin 10.3 (*)    HCT 31.3 (*)    RDW 15.6 (*)    Platelets 923 (*)    All other components within normal limits  URINALYSIS, ROUTINE W REFLEX MICROSCOPIC - Abnormal; Notable for the following components:   Color, Urine AMBER (*)    APPearance HAZY (*)    Leukocytes, UA MODERATE (*)    Bacteria, UA RARE (*)    All other components within normal limits  I-STAT CG4 LACTIC ACID, ED - Abnormal; Notable for the following components:   Lactic Acid, Venous 3.56 (*)  All other components within normal limits  CULTURE, BLOOD (ROUTINE X 2)  CULTURE, BLOOD (ROUTINE X 2)  URINE CULTURE  TROPONIN I    PATHOLOGIST SMEAR REVIEW  I-STAT CG4 LACTIC ACID, ED    EKG EKG Interpretation  Date/Time:  Sunday February 10 2018 02:25:33 EDT Ventricular Rate:  127 PR Interval:    QRS Duration: 116 QT Interval:  317 QTC Calculation: 461 R Axis:   60 Text Interpretation:  Sinus tachycardia Nonspecific intraventricular conduction delay Borderline repolarization abnormality nonspecific inferior lateral T wave changes compared to past ecg Confirmed by Jola Schmidt [phone]) on 02/10/2018 3:38:20 AM   Radiology Dg Chest Port 1 View  Result Date: 02/10/2018 CLINICAL DATA:  62 y/o  F; altered mental status. EXAM: PORTABLE CHEST 1 VIEW COMPARISON:  None. FINDINGS: The heart size and mediastinal contours are within normal limits. Both lungs are clear. ACDF hardware noted. No acute osseous abnormality is evident. Osteoarthrosis of the shoulder joints bilaterally. IMPRESSION: No active disease. Electronically Signed   By: Kristine Garbe M.D.   On: 02/10/2018 03:03    Procedures .Critical Care Performed by: Jola Schmidt, MD Authorized by: Jola Schmidt, MD    CRITICAL CARE Performed by: Jola Schmidt Total critical care time: 33 minutes Critical care time was exclusive of separately billable procedures and treating other patients. Critical care was necessary to treat or prevent imminent or life-threatening deterioration. Critical care was time spent personally by me on the following activities: development of treatment plan with patient and/or surrogate as well as nursing, discussions with consultants, evaluation of patient's response to treatment, examination of patient, obtaining history from patient or surrogate, ordering and performing treatments and interventions, ordering and review of laboratory studies, ordering and review of radiographic studies, pulse oximetry and re-evaluation of patient's condition.   Medications Ordered in ED Medications  vancomycin (VANCOCIN) 2,000 mg in sodium  chloride 0.9 % 500 mL IVPB (2,000 mg Intravenous New Bag/Given 02/10/18 0343)  albumin human 25 % solution 25 g (has no administration in time range)  sodium chloride 0.9 % bolus 1,000 mL (0 mLs Intravenous Stopped 02/10/18 0350)    And  sodium chloride 0.9 % bolus 1,000 mL (0 mLs Intravenous Stopped 02/10/18 0350)    And  sodium chloride 0.9 % bolus 1,000 mL (1,000 mLs Intravenous New Bag/Given 02/10/18 0241)    And  sodium chloride 0.9 % bolus 500 mL (500 mLs Intravenous New Bag/Given 02/10/18 0302)  piperacillin-tazobactam (ZOSYN) IVPB 3.375 g (0 g Intravenous Stopped 02/10/18 0337)  acetaminophen (TYLENOL) tablet 1,000 mg (1,000 mg Oral Given 02/10/18 0405)     Initial Impression / Assessment and Plan / ED Course  I have reviewed the triage vital signs and the nursing notes.  Pertinent labs & imaging results that were available during my care of the patient were reviewed by me and considered in my medical decision making (see chart for details).     Sepsis and profound hypovolemia on arrival to emergency department.  Patient with evidence of acute kidney injury with a creatinine of 4.  Baseline is 0.8.  Temp Foley placed for urine sample as well as for temperature monitoring given her fever and sepsis with associated septic shock.  Lactate elevated at 3-1/2.  Blood pressure seems to be improving with IV fluids at this time.  Broad-spectrum antibiotics at this time.  Plan for admission to either stepdown or intensive care unit based on response to IV fluids.  4:16 AM Heart rate improving.  Blood pressure  up to 100 but now falling back down.  She will receive the remainder of her 30 cc/kg bolus.  She has a low albumin.  We will give her a dose of albumin as well to see if this helps from an oncotic pressure standpoint.   Family updated.  Family understands the severity of her illness.   Final Clinical Impressions(s) / ED Diagnoses   Final diagnoses:  Sepsis, due to unspecified organism  [facility])  AKI (acute kidney injury) [facility])  Dehydration    ED Discharge Orders    None       Jola Schmidt, MD 02/10/18 [phone]

## 2018-02-10 NOTE — Progress Notes (Signed)
PROGRESS NOTE    Natalie Macdonald  BDZ:[id number] DOB: [birth year] DOA: 02/10/2018 PCP: Clifton Custard, MD   Brief Narrative: Natalie Macdonald is a 62 y.o. 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 She presented with altered mental status found to have septic shock.   Assessment & Plan:   Principal Problem:   Septic shock ([place]) Active Problems:   Diabetes mellitus type 2 with complications ([place])   Left hemiplegia ([place])   Sacral decubitus ulcer, stage III (HCC)   AKI (acute kidney injury) ([place])   Toxic encephalopathy   Septic shock Patient received 3.5 L in the ED with improvement in blood pressure. Blood pressure down again now. Associated kidney injury. Source is wound vs urinary. Leukocytosis. -1L NS bolus -Repeat lactic acid -Blood/urine cultures pending -May need to start vasopressors if unable to keep pressure supported with IV fluids -Continue Vancomycin and Zosyn  Delirium In setting of septic shock. Resolved.  Skin ulcers Multiple. Wound care consulted  Acute kidney injury In setting of septic shock. Patient with good urine output so far. -Urine output -BMP -Will need to be cautious with vancomycin  Diabetes mellitus, type 2 Uncontrolled with hyperglycemia at home and hypoglycemia during admission. -Continue Lantus 10 units -Continue SSI (qAC/HS)  Left hemiplegia Chronic. Baseline.  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 Feels fine.  Objective: Vitals:   02/10/18 0700 02/10/18 0715 02/10/18 0730 02/10/18 0830  BP: (!) 91/49     Pulse: 99 100 99   Resp: 12 13 13    Temp:    97.6 F (36.4 C)  TempSrc:    Oral  SpO2: 98% 98% 98%   Weight:      Height:        Intake/Output Summary (Last 24 hours) at 02/10/2018 1038 Last data filed at 02/10/2018 0731 Gross per 24 hour  Intake 4133.94 ml  Output 725 ml  Net 3408.94 ml   Filed Weights   02/10/18 0427  Weight: 112 kg    Examination:  General exam: Appears calm and comfortable   Data Reviewed: I have personally reviewed following labs and imaging studies  CBC: Recent Labs  Lab 02/10/18 0236  WBC 23.8*  NEUTROABS 18.5  HGB 10.3*  HCT 31.3*  MCV 86.0  PLT 341*   Basic Metabolic Panel: Recent Labs  Lab 02/10/18 0236  NA 131*  K 4.2  CL 90*  CO2 24  GLUCOSE 191*  BUN 41*  CREATININE 4.21*  CALCIUM 8.1*   GFR: Estimated Creatinine Clearance: 17.4 mL/min (A) (by C-G formula based on SCr of 4.21 mg/dL (H)). Liver Function Tests: Recent Labs  Lab 02/10/18 0236  AST 38  ALT 27  ALKPHOS 129*  BILITOT 0.4  PROT 6.7  ALBUMIN 1.8*   No results for input(s): LIPASE, AMYLASE in the last 168 hours. No results for input(s): AMMONIA in the last 168 hours. Coagulation Profile: No results for input(s): INR, PROTIME in the last 168 hours. Cardiac  Enzymes: Recent Labs  Lab 02/10/18 0236  TROPONINI <0.03   BNP (last 3 results) No results for input(s): PROBNP in the last 8760 hours. HbA1C: No results for input(s): HGBA1C in the last 72 hours. CBG: Recent Labs  Lab 02/10/18 0834 02/10/18 0859 02/10/18 0934  GLUCAP 41* 44* 116*   Lipid Profile: No results for input(s): CHOL, HDL, LDLCALC, TRIG, CHOLHDL, LDLDIRECT in the last 72 hours. Thyroid Function Tests: No results for input(s): TSH, T4TOTAL, FREET4, T3FREE,  THYROIDAB in the last 72 hours. Anemia Panel: No results for input(s): VITAMINB12, FOLATE, FERRITIN, TIBC, IRON, RETICCTPCT in the last 72 hours. Sepsis Labs: Recent Labs  Lab 02/10/18 0300 02/10/18 0517  LATICACIDVEN 3.56* 2.20*    No results found for this or any previous visit (from the past 240 hour(s)).       Radiology Studies: Ct Abdomen Pelvis Wo Contrast  Result Date: 02/10/2018 CLINICAL DATA:  Sepsis. Signs of urinary tract infection. EXAM: CT ABDOMEN AND PELVIS WITHOUT CONTRAST TECHNIQUE: Multidetector CT imaging of the abdomen and pelvis was performed following the standard protocol without IV contrast. COMPARISON:  None. FINDINGS: Lower chest: Minimal bibasilar atelectasis. Hepatobiliary: No focal liver abnormality is seen. Status post cholecystectomy. No biliary dilatation. Pancreas: Unremarkable. No pancreatic ductal dilatation or surrounding inflammatory changes. Spleen: Normal in size without focal abnormality. Adrenals/Urinary Tract: Normal appearing adrenal glands. 4 mm lower pole right renal calculus. Normal appearing left kidney and ureters. Foley catheter in the urinary bladder. Stomach/Bowel: Prominent stool in the colon. Unremarkable stomach and small bowel. No evidence of appendicitis. Vascular/Lymphatic: Atheromatous arterial calcifications without aneurysm. No enlarged lymph nodes. Reproductive: Uterus and bilateral adnexa are unremarkable. Other: No abdominal wall hernia or abnormality. No abdominopelvic ascites. Musculoskeletal: Mild lumbar and lower thoracic spine degenerative changes. Mild bilateral hip degenerative changes. IMPRESSION: 1. No acute abnormality. 2. Prominent stool in the colon. 3. 4 mm nonobstructing lower pole right renal calculus. Electronically Signed   By: Claudie Revering M.D.   On: 02/10/2018 07:46   Dg Chest Port 1 View  Result Date: 02/10/2018 CLINICAL DATA:  62 y/o  F; altered mental status. EXAM: PORTABLE CHEST 1 VIEW COMPARISON:  None.  FINDINGS: The heart size and mediastinal contours are within normal limits. Both lungs are clear. ACDF hardware noted. No acute osseous abnormality is evident. Osteoarthrosis of the shoulder joints bilaterally. IMPRESSION: No active disease. Electronically Signed   By: Kristine Garbe M.D.   On: 02/10/2018 03:03        Scheduled Meds: . amitriptyline  100 mg Oral QHS  . enoxaparin (LOVENOX) injection  30 mg Subcutaneous Q24H  . insulin glargine  10 Units Subcutaneous QHS  . insulin regular  0-5 Units Subcutaneous QHS  . insulin regular  0-9 Units Subcutaneous TID WC  . levothyroxine  150 mcg Oral QAC breakfast  . multivitamin with minerals  1 tablet Oral Daily  . oxybutynin  15 mg Oral Daily  . pantoprazole  40 mg Oral Daily  . pravastatin  20 mg Oral Daily  . vitamin B-6  250 mg Oral Daily  . vancomycin variable dose per unstable renal function (pharmacist dosing)   Does not apply See admin instructions  . vitamin B-12  1,000 mcg Oral Daily  . vitamin E  400 Units Oral Daily   Continuous Infusions: . sodium chloride 125 mL/hr at 02/10/18 0649  . sodium chloride 250 mL (02/10/18 1032)  . piperacillin-tazobactam (ZOSYN)  IV 2.25 g (02/10/18 1033)  . sodium chloride  LOS: 0 days     Cordelia Poche, MD [facility] 02/10/2018, 10:38 AM Pager: [phone]  If 7PM-7AM, please contact night-coverage [email or web address] 02/10/2018, 10:38 AM

## 2018-02-10 NOTE — Progress Notes (Signed)
Hypoglycemic Event  CBG: 41   Treatment: 15 GM carbohydrate snack  Symptoms: None  Follow-up CBG: [id number] CBG Result:44  Possible Reasons for Event: Unknown  Comments/MD notified:YES     Natalie Macdonald [name]

## 2018-02-10 NOTE — H&P (Signed)
History and Physical    Natalie Macdonald CBJ:[id number] DOB: 04-17-1956 DOA: 02/10/2018  PCP: Clifton Custard, MD  Patient coming from: Home  I have personally briefly reviewed patient's old medical records in [place]  Chief Complaint: AMS  HPI: Natalie Macdonald is a 62 y.o. 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  At baseline: Patient lives at home, wheelchair bound at baseline, cared for by daughter since her April 2018 surgery it seems.  At baseline: She is typically alert and oriented, able to verbalize with hoarseness.  Eats regular diet.  Patient was admitted to the ICU on 7/4 for septic shock from MRSA infection of a then stage 3 sacral decubitus ulcer.  After a several day hospital stay with ABx, AKI resolved, mental status improved to baseline, and she was ultimately discharged on 7/11.  Family refused SNF.  There were also apparently problems with getting needed home wound care (see DC summary by Dr. Erlinda Hong on that day).  Patient had been doing okay at home.  But developed confusion, AMS decreased PO intake over the past 24 hours.  No medications taken in past 2 days apparently per husband.   ED Course: Patient found to be in shock on presentation with initial BPs as low as 61/43.  Tm 100.6, HR 122.  WBC 23.8k.  Creat 4.2 up from a baseline of 0.7.  BUN 41.  Most suspicious for source is the sacral decubitus with foul smelling, brownish drainage that husband also notes has new odor to it.  UA suggestive of either UTI or possibly just ATN findings.  Initial lactate of 3.5.  Given 3500 NS bolus and 25g albumin (albumin 1.8 initially).  Repeat lactate 2.2 and BP has come up to 90s/50s with a map of just about 65.  Patient given empiric zosyn and vanc.  She is now awake, answering a few questions.   Denies any pain anywhere, says her back just "itches".   Review of Systems: As per HPI otherwise 10 point review of systems negative.   Past Medical History:  Diagnosis Date  . Allergic rhinitis   . Arthritis    knees, shoulder  . Bronchitis    history  . Cellulitis of left foot   . Cervical disc disorder   . 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 ([place])   . Chronic osteomyelitis ([place])    left foot  . Chronic respiratory infection   . Diabetes mellitus without complication ([place])   . Diabetes mellitus, type II ([place])   . Diabetic foot ulcer ([place])    left foot  . Fibromyalgia   . GERD (gastroesophageal reflux disease)   . Headache    sinus  . History of blood transfusion   . Hypertension   . Hyponatremia   . hypothyroid   . Hypothyroidism   . Joint pain    bilateral legs  . Left hemiplegia ([place])   . Sarcoidosis   . Sepsis ([place])    HX  . Sepsis ([place])   . Uterine fibroid    hx  . Uterine fibroid     Past Surgical History:  Procedure Laterality Date  . ANTERIOR CERVICAL DECOMP/DISCECTOMY FUSION N/A 10/17/2016   Procedure: ANTERIOR CERVICAL DECOMPRESSION/DISCECTOMY FUSION CERVICAL FIVE- CERVICAL SIX, CERVICAL SIX- CERVICAL SEVEN, POSSIBLE CERVICAL SIX CORPECTOMY;  Surgeon: Consuella Lose, MD;  Location: [facility]  OR;  Service: Neurosurgery;  Laterality: N/A;  . ANTERIOR CERVICAL DECOMP/DISCECTOMY FUSION    . APPLICATION OF WOUND VAC Left    foot  . CHOLECYSTECTOMY    . FOOT SURGERY Bilateral    implants  . IRRIGATION AND DEBRIDEMENT FOOT Left    X2  . KNEE ARTHROPLASTY    . THYROIDECTOMY    . UTERINE FIBROID SURGERY       reports that she has never smoked. She has never used smokeless tobacco. She reports that she does not drink alcohol or use drugs.  Allergies  Allergen Reactions  . Grapefruit Bioflavonoid Complex Hives, Swelling and Other (See Comments)    Caused her to get hives, swelling of the eye, throat   . Latex Hives  . Procaine  Anaphylaxis  . Procaine Anaphylaxis  . Grapefruit Flavor [Flavoring Agent]   . Novolog [Insulin Aspart]     Okay to use Regular insulin (patient doesn't tolerate Novolog)      Family History  Problem Relation Age of Onset  . Throat cancer Mother      Prior to Admission medications   Medication Sig Start Date End Date Taking? Authorizing Provider  amitriptyline (ELAVIL) 100 MG tablet Take 100 mg by mouth at bedtime.    Yes [provider]  amLODipine-benazepril (LOTREL) 5-40 MG capsule Take 1 capsule by mouth daily.   Yes [provider]  APIDRA 100 UNIT/ML injection Inject 18-25 Units into the skin 3 (three) times daily with meals. Sliding Scale 12/13/17  Yes [provider]  baclofen (LIORESAL) 10 MG tablet Take 10 mg by mouth 3 (three) times daily as needed for muscle spasms.  10/03/16  Yes [provider]  Calcium Carbonate (CALCIUM 600 PO) Take 600 mg by mouth daily.   Yes [provider]  carvedilol (COREG) 25 MG tablet Take 25 mg by mouth 2 (two) times daily with a meal.   Yes [provider]  cetirizine (ZYRTEC) 10 MG tablet Take 10 mg by mouth daily as needed for allergies.    Yes [provider]  chlorthalidone (HYGROTON) 25 MG tablet Take 25 mg by mouth daily.   Yes [provider]  CRANBERRY PO Take 2 capsules by mouth daily.   Yes [provider]  diclofenac (VOLTAREN) 75 MG EC tablet Take 75 mg by mouth 2 (two) times daily. 12/05/17  Yes [provider]  doxycycline (DORYX) 100 MG EC tablet Take 100 mg by mouth 2 (two) times daily. 01/24/18  Yes [provider]  DULoxetine (CYMBALTA) 30 MG capsule Take 60 mg by mouth daily. 11/25/17  Yes [provider]  fluticasone (FLONASE) 50 MCG/ACT nasal spray Place 2 sprays into both nostrils daily as needed for allergies.    Yes [provider]  gabapentin (NEURONTIN) 300 MG capsule Take 300 mg by mouth 3 (three) times daily  as needed (for pain).    Yes [provider]  levothyroxine (SYNTHROID, LEVOTHROID) 150 MCG tablet Take 150 mcg by mouth daily before breakfast.   Yes [provider]  metFORMIN (GLUCOPHAGE) 500 MG tablet Take 500 mg by mouth 2 (two) times daily with a meal.   Yes [provider]  morphine (MS CONTIN) 30 MG 12 hr tablet Take 30 mg by mouth 2 (two) times daily. 10/30/17  Yes [provider]  Multiple Vitamin (MULTIVITAMIN) tablet Take 1 tablet by mouth daily.   Yes [provider]  oxybutynin (DITROPAN XL) 15 MG 24 hr tablet Take  15 mg by mouth daily.  11/06/17  Yes [provider]  Oxycodone HCl 10 MG TABS Take 10 mg by mouth every 8 (eight) hours as needed (for pain).  10/04/16  Yes [provider]  pantoprazole (PROTONIX) 40 MG tablet Take 40 mg by mouth daily.   Yes [provider]  pravastatin (PRAVACHOL) 20 MG tablet Take 20 mg by mouth daily.   Yes [provider]  Probiotic Product (PROBIOTIC PO) Take 2 capsules by mouth daily.   Yes [provider]  Pyridoxine HCl (VITAMIN B-6) 250 MG tablet Take 250 mg by mouth daily.   Yes [provider]  ranitidine (ZANTAC) 75 MG tablet Take 150 mg by mouth daily as needed for heartburn.   Yes [provider]  silver sulfADIAZINE (SILVADENE) 1 % cream Apply 1 application topically 2 (two) times daily.  01/15/18  Yes [provider]  tiZANidine (ZANAFLEX) 4 MG tablet Take 4 mg by mouth every 8 (eight) hours as needed for muscle spasms.  12/05/17  Yes [provider]  TOUJEO SOLOSTAR 300 UNIT/ML SOPN Inject 40-50 Units into the skin at bedtime. Sliding Scale 10/30/17  Yes [provider]  vitamin B-12 (CYANOCOBALAMIN) 1000 MCG tablet Take 1,000 mcg by mouth daily.   Yes [provider]  vitamin E 400 UNIT capsule Take 400 Units by mouth daily.   Yes [provider]    Physical Exam: Vitals:   02/10/18 0420  02/10/18 0427 02/10/18 0430 02/10/18 0440  BP: (!) 77/57  (!) 88/56 (!) 95/45  Pulse: (!) 110  (!) 113 (!) 122  Resp: 16  19 12   Temp:      TempSrc:      SpO2: 99%  98% 100%  Weight:  112 kg    Height:  5\' 6"  (1.676 m)      Constitutional: NAD, calm, comfortable Eyes: Pupils pin point bilaterally, lids and conjunctivae normal ENMT: Mucous membranes are moist. Posterior pharynx clear of any exudate or lesions.Normal dentition.  Neck: normal, supple, no masses, no thyromegaly, hoarse voice (chronic vocal cord paralysis) Respiratory: clear to auscultation bilaterally, no wheezing, no crackles. Normal respiratory effort. No accessory muscle use.  Cardiovascular: Regular rate and rhythm, no murmurs / rubs / gallops. No extremity edema. 2+ pedal pulses. No carotid bruits.  Abdomen: no tenderness, no masses palpated. No hepatosplenomegaly. Bowel sounds positive.  Musculoskeletal: no clubbing / cyanosis. No joint deformity upper and lower extremities. Good ROM, no contractures. Normal muscle tone.  Skin: Stage 3-4 decubitus of buttocks with foul smelling drainage, also has lower stage decubiti of upper back, L external thigh with eschar, and DTI to L heel Neurologic: CN 2-12 grossly intact. Sensation intact, DTR normal. Strength 5/5 in all 4.  Psychiatric: Answers questions slowly, follows commands, oriented to self and situation   Labs on Admission: I have personally reviewed following labs and imaging studies  CBC: Recent Labs  Lab 02/10/18 0236  WBC 23.8*  NEUTROABS 18.5  HGB 10.3*  HCT 31.3*  MCV 86.0  PLT 341*   Basic Metabolic Panel: Recent Labs  Lab 02/10/18 0236  NA 131*  K 4.2  CL 90*  CO2 24  GLUCOSE 191*  BUN 41*  CREATININE 4.21*  CALCIUM 8.1*   GFR: Estimated Creatinine Clearance: 17.4 mL/min (A) (by C-G formula based on SCr of 4.21 mg/dL (H)). Liver Function Tests: Recent Labs  Lab 02/10/18 0236  AST 38  ALT 27  ALKPHOS 129*  BILITOT 0.4  PROT 6.7    ALBUMIN 1.8*   No results for input(s): LIPASE, AMYLASE in the last 168 hours. No results for input(s): AMMONIA in the last 168 hours. Coagulation Profile: No results for input(s): INR, PROTIME in the last 168 hours. Cardiac Enzymes: Recent Labs  Lab 02/10/18 0236  TROPONINI <0.03   BNP (last 3 results) No results for input(s): PROBNP in the last 8760 hours. HbA1C: No results for input(s): HGBA1C in the last 72 hours. CBG: No results for input(s): GLUCAP in the last 168 hours. Lipid Profile: No results for input(s): CHOL, HDL, LDLCALC, TRIG, CHOLHDL, LDLDIRECT in the last 72 hours. Thyroid Function Tests: No results for input(s): TSH, T4TOTAL, FREET4, T3FREE, THYROIDAB in the last 72 hours. Anemia Panel: No results for input(s): VITAMINB12, FOLATE, FERRITIN, TIBC, IRON, RETICCTPCT in the last 72 hours. Urine analysis:    Component Value Date/Time   COLORURINE AMBER (A) 02/10/2018 0242   APPEARANCEUR HAZY (A) 02/10/2018 0242   LABSPEC 1.019 02/10/2018 0242   PHURINE 5.0 02/10/2018 0242   GLUCOSEU NEGATIVE 02/10/2018 0242   HGBUR NEGATIVE 02/10/2018 0242   BILIRUBINUR NEGATIVE 02/10/2018 0242   KETONESUR NEGATIVE 02/10/2018 0242   PROTEINUR NEGATIVE 02/10/2018 0242   NITRITE NEGATIVE 02/10/2018 0242   LEUKOCYTESUR MODERATE (A) 02/10/2018 0242    Radiological Exams on Admission: Dg Chest Port 1 View  Result Date: 02/10/2018 CLINICAL DATA:  62 y/o  F; altered mental status. EXAM: PORTABLE CHEST 1 VIEW COMPARISON:  None. FINDINGS: The heart size and mediastinal contours are within normal limits. Both lungs are clear. ACDF hardware noted. No acute osseous abnormality is evident. Osteoarthrosis of the shoulder joints bilaterally. IMPRESSION: No active disease. Electronically Signed   By: Kristine Garbe M.D.   On: 02/10/2018 03:03    EKG: Independently reviewed.  Assessment/Plan Principal Problem:   Septic shock (HCC) Active Problems:   Diabetes mellitus type 2  with complications (HCC)   Left hemiplegia (HCC)   Sacral decubitus ulcer, stage III ([place])   AKI (acute kidney injury) ([place])   Toxic encephalopathy    1. Septic shock - Likely from the buttocks ulcer, though UTI is also a possibility. 1. Sepsis pathway 2. Empiric zosyn / vanc 3. 3.5L NS bolus, maint at 125 4. Also getting 25g albumin 5. UCx, BCx pending, Wcx ordered 1. Note: Grew MRSA from wound on nearly identical admit a month ago. 6. Trend lactates 7. CXR neg, getting CT abd/pelvis w/o contrast to eval for possible renal obstruction, abscess associated with the ulcer, and any obvious osteomyelitis associated with ulcer 2. AMS - multifactorial 1. Delirium due to septic shock 2. Also is somewhat overdosed on opiates at the moment due to not excreting her usual home pain meds because of AKF 3. Multiple skin ulcers - at least 4 areas of concern 1. Most worrisome is the sacral decubitus of course 2. Wound care consult 4. AKI - likely ATN vs pre-renal due to septic shock 1. Hold all BP meds 2. Hold all nephrotoxic meds 3. Hold Cymbalta for the moment 4. IVF 5. Strict intake and output 6. Ruling out obstruction with CT abd/pelvis 5. DM2 - 1. Hold metformin due to AKI 2. Lantus 10 QHS 3. Sensitive SSI q4h for the moment until eating, then switch to [facility] 6. L hemiplegia - 1. Chronic and baseline  DVT prophylaxis: Lovenox Code Status: Full Family Communication: Husband at bedside Disposition Plan: TBD Consults called: None Admission status: Admit to inpatient - see above discussion regarding this very  sick patient with septic shock, multi organ system involvement, hypotension, etc.   Kaleia Longhi, [facility] Pager [phone] Only works nights!  If 7AM-7PM, please contact the primary day team physician taking care of patient  [email or web address] Password [facility]  02/10/2018, 5:19 AM

## 2018-02-10 NOTE — ED Triage Notes (Signed)
Pt BIB EMS from home. Family called out due to patient having AMS. Orientation is questionable. Family states patient is "not acting right." Patient has multiple decubitus ulcers on her coccyx, middle of her shoulder blades, on her feet and on her left anterior thigh all in multiple stages of healing. Patient is a diabetic and is wheel chair bound.

## 2018-02-10 NOTE — Progress Notes (Signed)
Hypoglycemic Event  CBG: 44  Treatment: D50 IV 50 mL  Symptoms: None  Follow-up CBG: [id number] CBG Result:116  Possible Reasons for Event: Unknown  Comments/MD notified:yes     Natalie Macdonald [employer]

## 2018-02-10 NOTE — ED Notes (Signed)
Bed: RESA Expected date:  Expected time:  Means of arrival:  Comments: 62 yo F/Poss sepsis

## 2018-02-10 NOTE — Progress Notes (Signed)
A consult was received from an ED physician for zosyn and vancomycin per pharmacy dosing.  The patient's profile has been reviewed for ht/wt/allergies/indication/available labs.   A one time order has been placed for Zosyn 3.375 gm and Vancomycin 2 gm.  Further antibiotics/pharmacy consults should be ordered by admitting physician if indicated.                       Thank you, Dorrene German 02/10/2018  2:43 AM

## 2018-02-10 NOTE — ED Notes (Signed)
Date and time results received: 02/10/18 0336   Test: platelet  Critical Value: 923  Name of Provider Notified: Alcario Drought, MD  Orders Received? Or Actions Taken?:

## 2018-02-10 NOTE — Progress Notes (Signed)
Pharmacy Antibiotic Note  Natalie Macdonald is a 62 y.o. female admitted on 02/10/2018 with sepsis.  Pharmacy has been consulted for zosyn and vancomycin dosing.  Plan: Zosyn 3.375 gm x1 then 2.25 Gm IV q6h Vancomycin 2 Gm x 1 then f/u scr/level for further doses Goal AUC = 400-500 F/u cultures  Height: 5\' 6"  (167.6 cm) Weight: 246 lb 14.6 oz (112 kg) IBW/kg (Calculated) : 59.3  Temp (24hrs), Avg:100.6 F (38.1 C), Min:100.6 F (38.1 C), Max:100.6 F (38.1 C)  Recent Labs  Lab 02/10/18 0236 02/10/18 0300  WBC 23.8*  --   CREATININE 4.21*  --   LATICACIDVEN  --  3.56*    Estimated Creatinine Clearance: 17.4 mL/min (A) (by C-G formula based on SCr of 4.21 mg/dL (H)).    Allergies  Allergen Reactions  . Grapefruit Bioflavonoid Complex Hives, Swelling and Other (See Comments)    Caused her to get hives, swelling of the eye, throat   . Latex Hives  . Procaine Anaphylaxis  . Procaine Anaphylaxis  . Grapefruit Flavor [Flavoring Agent]   . Novolog [Insulin Aspart]     Okay to use Regular insulin (patient doesn't tolerate Novolog)      Antimicrobials this admission: 8/11 zosyn >>  8/11 vancomycin >>   Dose adjustments this admission:   Microbiology results:  BCx:   UCx:    Sputum:    MRSA PCR:   Thank you for allowing pharmacy to be a part of this patient's care.  Dorrene German 02/10/2018 4:29 AM

## 2018-02-10 NOTE — Progress Notes (Signed)
On admission assessment patient has extensive wounds. Deep tissue injury found to be on heel of left foot- prevalon boots ordered and heel protectors placed. An un-stageable pressure injury found to be along mid spine- Measurements are as follows: 1X5X0, protective foam placed and patient turned off back side. An un-stageable pressure injury found to be on left lateral thigh, foam dressing placed for protection. An un-stageable found to be on her left second toe, left open to air and prevalon boots to be placed. Sacral / gluteal cleft wound with extensive draining stage three at least but tunnels unable to visualize complete depth, measurements 15*10*3. Complete measurements unable to be obtained at this time due to patient condition. This WTA to help [place] team during assessment. Patient states she is from home with husband and daughter, family will need education on wound care and skin integrity education. WOC order placed.

## 2018-02-10 NOTE — Progress Notes (Addendum)
Natalie Macdonald  NLG:[id number] DOB: 07-24-1955 DOA: 02/10/2018 PCP: Clifton Custard, MD    Brief Narrative:  The patient is a 62 year old woman with prior episodes of sepsis attributed to decubitus ulcer from hemiparesis and immobility.   She presented to the ED with confusion and decreased oral intake.  Noted to be hypotensive to SBP 70's and AKI with creatinine of 4.0 and an elevated lactate at 3.5.  Called to see for persistent hypotension despite 3L of fluid resuscitation.  Subjective:  She is awake and alert and complaining of pain in her sacral area. Denies dyspnea. Complains of right arm pain over iv site.  BP has improved to MAP> 70 with 4th bolus.  Assessment & Plan:  Septic shock, presumably due to infected decubitus ulcer.  Poor oral intake suggests component of hypovolemic shock.   MAP has improved with further fluid resuscitation and clinical perfusion is adequate - sensorium has cleared and patient has produced >165ml/h of urine.  Lactate was beginning to clear this morning and should be repeated.  Agree with current fluid resuscitation strategy of additional 2L bolus and maintenance at 181mL/h overnight.  Have left order for peripheral norepinephrine to max of 28mcg/min if required but doubt it will be necessary.   Sacral wound infection.   Associated leukocytosis and sacral pain.  Marked thrombocytosis indicates some chronicity.  Currently on appropriate empiric coverage. Consider surgical debridement for source control but long-term prognosis for eventual healing is guarded.  Acute kidney injury:  Cr now 4.21 but indications for acute dialysis at present. Follow renal function as may improve with fluid alone as urinalysis is bland, consistent with pre-renal azotemia.  Preventative quality of care items as per primary team.  Micro data: urinalysis shows mixed flora.   Antimicrobials:  Vancomycin and Zosyn started 02/09/18   Objective: Blood pressure (!)  78/53, pulse (!) 103, temperature (!) 97.2 F (36.2 C), temperature source Axillary, resp. rate 13, height 5\' 6"  (1.676 m), weight 112 kg, SpO2 100 %.        Intake/Output Summary (Last 24 hours) at 02/10/2018 1527 Last data filed at 02/10/2018 1051 Gross per 24 hour  Intake 4133.94 ml  Output 1025 ml  Net 3108.94 ml   Filed Weights   02/10/18 0427  Weight: 112 kg    Examination: General: Well nourished. Awake and fully conversant. HENT: No icterus. Mucus membranes moist. Lungs: Chest clear. Cardiovascular: no JVD. HS normal. Warm extremities. Capillary refill is still borderline. Abdomen: Soft and non-tender. Extremities: leg edema.  No signs of R arm trauma. Iv sites intact. Neuro: Sensorium clear. No CN deficits. Chronic left sided weakness. GU: Foley catheter in place with clear urine.  CBC: Recent Labs  Lab 02/10/18 0236  WBC 23.8*  NEUTROABS 18.5  HGB 10.3*  HCT 31.3*  MCV 86.0  PLT 081*   Basic Metabolic Panel: Recent Labs  Lab 02/10/18 0236  NA 131*  K 4.2  CL 90*  CO2 24  GLUCOSE 191*  BUN 41*  CREATININE 4.21*  CALCIUM 8.1*   GFR: Estimated Creatinine Clearance: 17.4 mL/min (A) (by C-G formula based on SCr of 4.21 mg/dL (H)). Recent Labs  Lab 02/10/18 0236 02/10/18 0300 02/10/18 0517  WBC 23.8*  --   --   LATICACIDVEN  --  3.56* 2.20*   Liver Function Tests: Recent Labs  Lab 02/10/18 0236  AST 38  ALT 27  ALKPHOS 129*  BILITOT 0.4  PROT 6.7  ALBUMIN 1.8*  No results for input(s): LIPASE, AMYLASE in the last 168 hours. No results for input(s): AMMONIA in the last 168 hours.  Coagulation Profile: No results for input(s): INR, PROTIME in the last 168 hours.  Cardiac Enzymes: Recent Labs  Lab 02/10/18 0236  TROPONINI <0.03    HbA1C: Hgb A1c MFr Bld  Date/Time Value Ref Range Status  01/03/2018 01:26 AM 9.0 (H) 4.8 - 5.6 % Final    Comment:    (NOTE) Pre diabetes:          5.7%-6.4% Diabetes:              >6.4% Glycemic  control for   <7.0% adults with diabetes   10/10/2016 02:23 PM 9.3 (H) 4.8 - 5.6 % Final    Comment:    (NOTE)         Pre-diabetes: 5.7 - 6.4         Diabetes: >6.4         Glycemic control for adults with diabetes: <7.0     CBG: Recent Labs  Lab 02/10/18 0834 02/10/18 0859 02/10/18 0934 02/10/18 1150  GLUCAP 41* 44* 116* 141*      LOS: 0 days    Kipp Brood, MD [facility] ICU Physician [place]  Pager: [phone] Mobile: [phone] After hours: [phone].

## 2018-02-11 ENCOUNTER — Inpatient Hospital Stay (HOSPITAL_COMMUNITY): Payer: Medicare Other

## 2018-02-11 DIAGNOSIS — E876 Hypokalemia: Secondary | ICD-10-CM

## 2018-02-11 DIAGNOSIS — N179 Acute kidney failure, unspecified: Secondary | ICD-10-CM

## 2018-02-11 DIAGNOSIS — M24542 Contracture, left hand: Secondary | ICD-10-CM

## 2018-02-11 DIAGNOSIS — G8194 Hemiplegia, unspecified affecting left nondominant side: Secondary | ICD-10-CM

## 2018-02-11 DIAGNOSIS — R6521 Severe sepsis with septic shock: Secondary | ICD-10-CM

## 2018-02-11 DIAGNOSIS — L89153 Pressure ulcer of sacral region, stage 3: Secondary | ICD-10-CM

## 2018-02-11 LAB — BASIC METABOLIC PANEL
Anion gap: 7 (ref 5–15)
BUN: 20 mg/dL (ref 8–23)
CO2: 22 mmol/L (ref 22–32)
CREATININE: 0.96 mg/dL (ref 0.44–1.00)
Calcium: 7 mg/dL — ABNORMAL LOW (ref 8.9–10.3)
Chloride: 106 mmol/L (ref 98–111)
GFR calc non Af Amer: >60 mL/min (ref >60)
Glucose, Bld: 51 mg/dL — ABNORMAL LOW (ref 70–99)
Potassium: 2.9 mmol/L — ABNORMAL LOW (ref 3.5–5.1)
Sodium: 135 mmol/L (ref 135–145)

## 2018-02-11 LAB — BLOOD CULTURE ID PANEL (REFLEXED)
ACINETOBACTER BAUMANNII: NOT DETECTED
ACINETOBACTER BAUMANNII: NOT DETECTED
CANDIDA ALBICANS: NOT DETECTED
CANDIDA GLABRATA: NOT DETECTED
CANDIDA KRUSEI: NOT DETECTED
CANDIDA KRUSEI: NOT DETECTED
CANDIDA PARAPSILOSIS: NOT DETECTED
Candida albicans: NOT DETECTED
Candida glabrata: NOT DETECTED
Candida parapsilosis: NOT DETECTED
Candida tropicalis: NOT DETECTED
Candida tropicalis: NOT DETECTED
ENTEROBACTER CLOACAE COMPLEX: NOT DETECTED
ENTEROBACTERIACEAE SPECIES: NOT DETECTED
ENTEROCOCCUS SPECIES: NOT DETECTED
ESCHERICHIA COLI: NOT DETECTED
Enterobacter cloacae complex: NOT DETECTED
Enterobacteriaceae species: NOT DETECTED
Enterococcus species: NOT DETECTED
Escherichia coli: NOT DETECTED
Haemophilus influenzae: NOT DETECTED
Haemophilus influenzae: NOT DETECTED
KLEBSIELLA OXYTOCA: NOT DETECTED
KLEBSIELLA PNEUMONIAE: NOT DETECTED
Klebsiella oxytoca: NOT DETECTED
Klebsiella pneumoniae: NOT DETECTED
Listeria monocytogenes: NOT DETECTED
Listeria monocytogenes: NOT DETECTED
METHICILLIN RESISTANCE: NOT DETECTED
Neisseria meningitidis: NOT DETECTED
Neisseria meningitidis: NOT DETECTED
PROTEUS SPECIES: NOT DETECTED
PROTEUS SPECIES: NOT DETECTED
PSEUDOMONAS AERUGINOSA: NOT DETECTED
Pseudomonas aeruginosa: NOT DETECTED
SERRATIA MARCESCENS: NOT DETECTED
STAPHYLOCOCCUS AUREUS BCID: NOT DETECTED
STAPHYLOCOCCUS SPECIES: DETECTED — AB
STAPHYLOCOCCUS SPECIES: NOT DETECTED
STREPTOCOCCUS PNEUMONIAE: NOT DETECTED
STREPTOCOCCUS PNEUMONIAE: NOT DETECTED
Serratia marcescens: NOT DETECTED
Staphylococcus aureus (BCID): NOT DETECTED
Streptococcus agalactiae: NOT DETECTED
Streptococcus agalactiae: NOT DETECTED
Streptococcus pyogenes: NOT DETECTED
Streptococcus pyogenes: NOT DETECTED
Streptococcus species: NOT DETECTED
Streptococcus species: NOT DETECTED

## 2018-02-11 LAB — GLUCOSE, CAPILLARY
GLUCOSE-CAPILLARY: 57 mg/dL — AB (ref 70–99)
GLUCOSE-CAPILLARY: 94 mg/dL (ref 70–99)
GLUCOSE-CAPILLARY: 123 mg/dL — AB (ref 70–99)
Glucose-Capillary: 51 mg/dL — ABNORMAL LOW (ref 70–99)
Glucose-Capillary: 72 mg/dL (ref 70–99)
Glucose-Capillary: 61 mg/dL — ABNORMAL LOW (ref 70–99)
Glucose-Capillary: 72 mg/dL (ref 70–99)
Glucose-Capillary: 112 mg/dL — ABNORMAL HIGH (ref 70–99)

## 2018-02-11 LAB — CBC
HCT: 22.4 % — ABNORMAL LOW (ref 36.0–46.0)
Hemoglobin: 7.3 g/dL — ABNORMAL LOW (ref 12.0–15.0)
MCH: 28.3 pg (ref 26.0–34.0)
MCHC: 32.6 g/dL (ref 30.0–36.0)
MCV: 86.8 fL (ref 78.0–100.0)
Platelets: 598 10*3/uL — ABNORMAL HIGH (ref 150–400)
RBC: 2.58 MIL/uL — ABNORMAL LOW (ref 3.87–5.11)
RDW: 16 % — AB (ref 11.5–15.5)
WBC: 12.2 10*3/uL — ABNORMAL HIGH (ref 4.0–10.5)

## 2018-02-11 LAB — URINE CULTURE

## 2018-02-11 LAB — C-REACTIVE PROTEIN: CRP: 15.3 mg/dL — AB (ref <1.0)

## 2018-02-11 LAB — LACTIC ACID, PLASMA: Lactic Acid, Venous: 1.2 mmol/L (ref 0.5–1.9)

## 2018-02-11 LAB — PATHOLOGIST SMEAR REVIEW

## 2018-02-11 LAB — SEDIMENTATION RATE: SED RATE: 58 mm/h — AB (ref 0–22)

## 2018-02-11 LAB — CORTISOL: Cortisol, Plasma: 10.6 ug/dL

## 2018-02-11 LAB — MRSA PCR SCREENING: MRSA BY PCR: POSITIVE — AB

## 2018-02-11 MED ORDER — PIPERACILLIN-TAZOBACTAM 3.375 G IVPB
3.3750 g | Freq: Three times a day (TID) | INTRAVENOUS | Status: DC
Start: 2018-02-11 — End: 2018-02-18
  Administered 2018-02-11 – 2018-02-18 (×21): 3.375 g via INTRAVENOUS
  Filled 2018-02-11 (×21): qty 50

## 2018-02-11 MED ORDER — DEXTROSE 50 % IV SOLN
25.0000 mL | Freq: Once | INTRAVENOUS | Status: AC
  Administered 2018-02-11: 25 mL via INTRAVENOUS
  Filled 2018-02-11: qty 50

## 2018-02-11 MED ORDER — LACTATED RINGERS IV SOLN
INTRAVENOUS | Status: DC
  Administered 2018-02-11: 09:00:00 via INTRAVENOUS

## 2018-02-11 MED ORDER — INSULIN GLARGINE 100 UNIT/ML ~~LOC~~ SOLN: 8.0000 [IU] | Freq: Every day | SUBCUTANEOUS | Status: DC

## 2018-02-11 MED ORDER — FLUCONAZOLE IN SODIUM CHLORIDE 200-0.9 MG/100ML-% IV SOLN
200.0000 mg | Freq: Once | INTRAVENOUS | Status: AC
  Administered 2018-02-11: 200 mg via INTRAVENOUS
  Filled 2018-02-11: qty 100

## 2018-02-11 MED ORDER — POTASSIUM CHLORIDE CRYS ER 20 MEQ PO TBCR
40.0000 meq | EXTENDED_RELEASE_TABLET | ORAL | Status: AC
  Administered 2018-02-11 (×2): 40 meq via ORAL
  Filled 2018-02-11 (×2): qty 2

## 2018-02-11 MED ORDER — ENOXAPARIN SODIUM 40 MG/0.4ML ~~LOC~~ SOLN
40.0000 mg | SUBCUTANEOUS | Status: DC
  Administered 2018-02-11 – 2018-02-25 (×15): 40 mg via SUBCUTANEOUS
  Filled 2018-02-11 (×15): qty 0.4

## 2018-02-11 MED ORDER — MUPIROCIN CALCIUM 2 % EX CREA
TOPICAL_CREAM | Freq: Every day | CUTANEOUS | Status: DC
  Administered 2018-02-11: 1 via TOPICAL
  Administered 2018-02-12 – 2018-02-26 (×15): via TOPICAL
  Filled 2018-02-11: qty 15

## 2018-02-11 MED ORDER — JUVEN PO PACK: 1.0000 | PACK | Freq: Two times a day (BID) | ORAL | Status: DC

## 2018-02-11 MED ORDER — SODIUM CHLORIDE 0.9 % IV BOLUS
1000.0000 mL | Freq: Once | INTRAVENOUS | Status: AC
  Administered 2018-02-11: 1000 mL via INTRAVENOUS

## 2018-02-11 MED ORDER — MORPHINE SULFATE ER 30 MG PO TBCR
30.0000 mg | EXTENDED_RELEASE_TABLET | Freq: Two times a day (BID) | ORAL | Status: DC
  Administered 2018-02-11 – 2018-02-26 (×30): 30 mg via ORAL
  Filled 2018-02-11 (×30): qty 1

## 2018-02-11 MED ORDER — VANCOMYCIN HCL 10 G IV SOLR
1250.0000 mg | INTRAVENOUS | Status: DC
  Administered 2018-02-11 – 2018-02-17 (×7): 1250 mg via INTRAVENOUS
  Filled 2018-02-11 (×8): qty 1250

## 2018-02-11 MED ORDER — OXYCODONE HCL 5 MG PO TABS
5.0000 mg | ORAL_TABLET | Freq: Four times a day (QID) | ORAL | Status: DC | PRN
  Administered 2018-02-11: 5 mg via ORAL
  Filled 2018-02-11: qty 1

## 2018-02-11 MED ORDER — OXYCODONE HCL 5 MG PO TABS
10.0000 mg | ORAL_TABLET | Freq: Four times a day (QID) | ORAL | Status: DC | PRN
  Administered 2018-02-12 – 2018-02-26 (×41): 10 mg via ORAL
  Filled 2018-02-11 (×42): qty 2

## 2018-02-11 MED ORDER — COLLAGENASE 250 UNIT/GM EX OINT
TOPICAL_OINTMENT | Freq: Every day | CUTANEOUS | Status: DC
  Administered 2018-02-11: 1 via TOPICAL
  Administered 2018-02-12 – 2018-02-25 (×14): via TOPICAL
  Filled 2018-02-11: qty 30
  Filled 2018-02-11: qty 90
  Filled 2018-02-11: qty 30

## 2018-02-11 MED ORDER — ENSURE ENLIVE PO LIQD
237.0000 mL | Freq: Two times a day (BID) | ORAL | Status: DC
  Administered 2018-02-12 – 2018-02-26 (×9): 237 mL via ORAL

## 2018-02-11 MED ORDER — FLUCONAZOLE 100MG IVPB
100.0000 mg | INTRAVENOUS | Status: DC
  Administered 2018-02-12 – 2018-02-18 (×7): 100 mg via INTRAVENOUS
  Filled 2018-02-11 (×8): qty 50

## 2018-02-11 NOTE — Progress Notes (Addendum)
Hypoglycemic Event  CBG: 57  Treatment: Carbohydrate Snack   Symptoms: None   Follow-up CBG: [id number] CBG Result:72  Possible Reasons for Event: Pt. Not eating/ too much lantus?  Comments/MD notified: Lonny Prude MD    Doristine Johns

## 2018-02-11 NOTE — Progress Notes (Signed)
Pharmacy Antibiotic Note  Natalie Macdonald is a 62 y.o. female admitted on 02/10/2018 with sepsis.  Pharmacy has been consulted for zosyn and vancomycin dosing.  Plan:  Adjust Zosyn 3.375gm IV q8h (4hr extended infusions)  Received Vancomycin 2g IV on 8/11 at 0343 am then 1250mg  q24h starting 8/12 at 1600 (AUC 469, SCr 0.96)  Check vancomycin levels at steady state, goal AUC = 400-500  Fluconazole 200mg  IV x 1, then 100mg  IV q24h  Follow up renal function & cultures  Height: 5\' 6"  (167.6 cm) Weight: 246 lb 14.6 oz (112 kg) IBW/kg (Calculated) : 59.3  Temp (24hrs), Avg:98 F (36.7 C), Min:97.2 F (36.2 C), Max:98.7 F (37.1 C)  Recent Labs  Lab 02/10/18 0236 02/10/18 0300 02/10/18 0517 02/11/18 0607  WBC 23.8*  --   --  12.2*  CREATININE 4.21*  --   --  0.96  LATICACIDVEN  --  3.56* 2.20* 1.2    Estimated Creatinine Clearance: 76.1 mL/min (by C-G formula based on SCr of 0.96 mg/dL).    Allergies  Allergen Reactions  . Grapefruit Bioflavonoid Complex Hives, Swelling and Other (See Comments)    Caused her to get hives, swelling of the eye, throat   . Latex Hives  . Procaine Anaphylaxis  . Procaine Anaphylaxis  . Grapefruit Flavor [Flavoring Agent]   . Novolog [Insulin Aspart]     Okay to use Regular insulin (patient doesn't tolerate Novolog)      Antimicrobials this admission: 8/11 Zosyn >>  8/11 Vancomycin >>  8/12 Fluconazole (UTI) >>  Dose adjustments this admission:  Microbiology results: 8/11 BCx: BCID: 1 of 4 bottle staph species, no Mec A detected 8/11 UCx: >100k yeast  Thank you for allowing pharmacy to be a part of this patient's care.  Peggyann Juba, PharmD, BCPS Pager: [phone] 02/11/2018 9:14 AM

## 2018-02-11 NOTE — Progress Notes (Signed)
CBG rechecked in Right hand. CBG 72.

## 2018-02-11 NOTE — Progress Notes (Addendum)
Initial Nutrition Assessment  DOCUMENTATION CODES:   Morbid obesity  INTERVENTION:  - Will order Ensure Enlive BID, each supplement provides 350 kcal and 20 grams of protein. - Will order Magic Cup once/day with meals, thissupplement provides 290 kcal and 9 grams of protein - Continue to encourage PO intakes, focusing on protein.    NUTRITION DIAGNOSIS:   Increased nutrient needs related to wound healing, acute illness as evidenced by estimated needs.  GOAL:   Patient will meet greater than or equal to 90% of their needs  MONITOR:   PO intake, Supplement acceptance, Weight trends, Labs, Skin  REASON FOR ASSESSMENT:   Consult Assessment of nutrition requirement/status  ASSESSMENT:   62 y.o. 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 She presented with altered mental status found to be in septic shock.  Patient consumed 100% of breakfast this AM (400 kcal, 18 grams of protein). Spoke with RN prior to entering patient's room. Patient with residual vocal cord paralysis but is able to communicate without issue, does speak a bit slower. She is only able to use R arm/hand and has some weakness in that extremity. Patient prefers to feed herself. Patient has been having low CBGs/periods of hypoglycemia and RN reports that Lantus has been d/c'ed because of this.   When RD entered patient's room she reported that she is very tired as staff has been in and out of her room all day. Patient was polite and able to provide some information at this time. She reports intermittent abdominal pain and nausea which began PTA and that is not onset by any known causes. She confirms she is able to feed herself. At home she enjoys fruit and eggs and her daughter makes her smoothies with fruit and protein powder.   Per Dr. Lisbeth Ply note today: septic shock s/p 3.5L in the ED and a  total of 8L since admission, antibiotics for UTI, plan for MRI of sacrum, Surgery consult for wounds, hypokalemia, L-sided hemiplegia, nephrolithiasis, L hand contractures, AKI and delirium 2/2 septic shock now resolved.   Once septic shock resolved, will add Juven BID to aid in wound healing.    Medications reviewed; sliding scale Humulin, 150 mcg oral Synhroid/day, daily multivitamin with minerals, 1 packet Miralax BID, 40 mEq oral KC x2 doses today, 250 mg vitamin B6/day, 1000 mcg oral vitamin B12/day, 400 units vitamin E/day.  Labs reviewed; CBGs: 51-72 mg/dL today, K: 2.9 mmol/L, Ca: 7 mg/dL. IVF: LR @ 100 mL/hr.       NUTRITION - FOCUSED PHYSICAL EXAM:  Completed; no muscle and no fat wasting noted, mild/generalized edema present.   Diet Order:   Diet Order            Diet NPO time specified  Diet effective midnight        Diet Carb Modified Fluid consistency: Thin; Room service appropriate? Yes  Diet effective now              EDUCATION NEEDS:   No education needs have been identified at this time  Skin:  Skin Assessment: Skin Integrity Issues: Skin Integrity Issues:: Unstageable, DTI DTI: L heel Unstageable: full thickness to sacrum, along vertebral column, and to L 2nd toe  Last BM:  PTA/unknown  Height:   Ht Readings from Last 1 Encounters:  02/10/18 5\' 6"  (1.676 m)    Weight:   Wt Readings from Last 1 Encounters:  02/10/18 112 kg    Ideal Body Weight:  59.09 kg  BMI:  Body mass index is 39.85 kg/m.  Estimated Nutritional Needs:   Kcal:  1222-4114 (20-22 kcal/kg)  Protein:  125-135 grams  Fluid:  >/= 2 L/day     Jarome Matin, MS, RD, LDN, [facility] Inpatient Clinical Dietitian Pager # [phone] After hours/weekend pager # [phone]

## 2018-02-11 NOTE — Progress Notes (Signed)
Pt. Foley leaking on bed. Keep foley out per Marni Griffon, NP. Foley removed and documented. Will use purewick for pt.

## 2018-02-11 NOTE — Progress Notes (Signed)
PHARMACY - PHYSICIAN COMMUNICATION CRITICAL VALUE ALERT - BLOOD CULTURE IDENTIFICATION (BCID)  Natalie Macdonald is an 62 y.o. female who presented to [facility] on 02/10/2018 with a chief complaint of septic shock  Assessment:  Wound vs urine (include suspected source if known) 1 of 4 bottle: staph species MecA negative. Likely contaminant.   Name of physician (or Provider) Contacted: Tylene Fantasia, NP  Current antibiotics: zosyn and vancomycin  Changes to prescribed antibiotics recommended:  Consider narrowing abx when appropriate  Results for orders placed or performed during the hospital encounter of 02/10/18  Blood Culture ID Panel (Reflexed) (Collected: 02/10/2018  2:46 AM)  Result Value Ref Range   Enterococcus species NOT DETECTED NOT DETECTED   Listeria monocytogenes NOT DETECTED NOT DETECTED   Staphylococcus species DETECTED (A) NOT DETECTED   Staphylococcus aureus NOT DETECTED NOT DETECTED   Methicillin resistance NOT DETECTED NOT DETECTED   Streptococcus species NOT DETECTED NOT DETECTED   Streptococcus agalactiae NOT DETECTED NOT DETECTED   Streptococcus pneumoniae NOT DETECTED NOT DETECTED   Streptococcus pyogenes NOT DETECTED NOT DETECTED   Acinetobacter baumannii NOT DETECTED NOT DETECTED   Enterobacteriaceae species NOT DETECTED NOT DETECTED   Enterobacter cloacae complex NOT DETECTED NOT DETECTED   Escherichia coli NOT DETECTED NOT DETECTED   Klebsiella oxytoca NOT DETECTED NOT DETECTED   Klebsiella pneumoniae NOT DETECTED NOT DETECTED   Proteus species NOT DETECTED NOT DETECTED   Serratia marcescens NOT DETECTED NOT DETECTED   Haemophilus influenzae NOT DETECTED NOT DETECTED   Neisseria meningitidis NOT DETECTED NOT DETECTED   Pseudomonas aeruginosa NOT DETECTED NOT DETECTED   Candida albicans NOT DETECTED NOT DETECTED   Candida glabrata NOT DETECTED NOT DETECTED   Candida krusei NOT DETECTED NOT DETECTED   Candida parapsilosis NOT DETECTED NOT DETECTED   Candida  tropicalis NOT DETECTED NOT DETECTED    Dorrene German 02/11/2018  6:23 AM

## 2018-02-11 NOTE — Care Management Note (Signed)
Case Management Note  Patient Details  Name: Natalie Macdonald MRN: [id number] Date of Birth: September 24, 1955  Subjective/Objective:    Confusion, sepsis, and decubitus              From home with spouse has good family support/bld cultures positive from [facility]/urine culture-+ for yeast/Iv diflucan/iv Iv zosyn and Iv vancomycin/wbc-12.2/hgb 7.3  Action/Plan: Will follow for cm needs short term snf versus home with hhc due to decubitus.  Expected Discharge Date:  (unknown)               Expected Discharge Plan:  Home/Self Care  In-House Referral:     Discharge planning Services  CM Consult  Post Acute Care Choice:    Choice offered to:     DME Arranged:    DME Agency:     HH Arranged:    HH Agency:     Status of Service:  In process, will continue to follow  If discussed at Long Length of Stay Meetings, dates discussed:    Additional Comments:  Natalie, Depaoli, RN 02/11/2018, 10:06 AM

## 2018-02-11 NOTE — Progress Notes (Signed)
PHARMACY - PHYSICIAN COMMUNICATION CRITICAL VALUE ALERT - BLOOD CULTURE IDENTIFICATION (BCID)  Natalie Macdonald is an 62 y.o. female who presented to [facility] on 02/10/2018 with a chief complaint of sepsis  Assessment:  Sacral decub, UTI  Name of physician (or Provider) ContactedLonny Prude via secure chat  Current antibiotics:vanc/zosyn/diflucan  Changes to prescribed antibiotics recommended:  Patient is on recommended antibiotics - No changes needed  Results for orders placed or performed during the hospital encounter of 02/10/18  Blood Culture ID Panel (Reflexed) (Collected: 02/10/2018  2:36 AM)  Result Value Ref Range   Enterococcus species NOT DETECTED NOT DETECTED   Listeria monocytogenes NOT DETECTED NOT DETECTED   Staphylococcus species NOT DETECTED NOT DETECTED   Staphylococcus aureus NOT DETECTED NOT DETECTED   Streptococcus species NOT DETECTED NOT DETECTED   Streptococcus agalactiae NOT DETECTED NOT DETECTED   Streptococcus pneumoniae NOT DETECTED NOT DETECTED   Streptococcus pyogenes NOT DETECTED NOT DETECTED   Acinetobacter baumannii NOT DETECTED NOT DETECTED   Enterobacteriaceae species NOT DETECTED NOT DETECTED   Enterobacter cloacae complex NOT DETECTED NOT DETECTED   Escherichia coli NOT DETECTED NOT DETECTED   Klebsiella oxytoca NOT DETECTED NOT DETECTED   Klebsiella pneumoniae NOT DETECTED NOT DETECTED   Proteus species NOT DETECTED NOT DETECTED   Serratia marcescens NOT DETECTED NOT DETECTED   Haemophilus influenzae NOT DETECTED NOT DETECTED   Neisseria meningitidis NOT DETECTED NOT DETECTED   Pseudomonas aeruginosa NOT DETECTED NOT DETECTED   Candida albicans NOT DETECTED NOT DETECTED   Candida glabrata NOT DETECTED NOT DETECTED   Candida krusei NOT DETECTED NOT DETECTED   Candida parapsilosis NOT DETECTED NOT DETECTED   Candida tropicalis NOT DETECTED NOT DETECTED    Eudelia Bunch, Pharm.D [phone] 02/11/2018 5:30 PM

## 2018-02-11 NOTE — Progress Notes (Signed)
Hypoglycemic Event  CBG: 51  Treatment: 15ml D50 per standing order  Symptoms: change in mental status  Follow-up CBG: [id number] CBG Result:72  Possible Reasons for Event: Decrease intake   Comments/MD notified: standing orders followed    Fredric Mare

## 2018-02-11 NOTE — Progress Notes (Signed)
PROGRESS NOTE    Natalie Macdonald  DPO:[id number] DOB: [birth year] DOA: 02/10/2018 PCP: Clifton Custard, MD   Brief Narrative: Natalie Macdonald is a 62 y.o. 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 She presented with altered mental status found to have septic shock.   Assessment & Plan:   Principal Problem:   Septic shock ([place]) Active Problems:   Diabetes mellitus type 2 with complications ([place])   Left hemiplegia ([place])   Sacral decubitus ulcer, stage III ([facility])   AKI (acute kidney injury) ([place])   Toxic encephalopathy   Septic shock Patient received 3.5 L in the ED with improvement in blood pressure. Blood pressure down again now. Associated kidney injury. Source is wound vs urinary. Leukocytosis. Patient received 8 L of IV fluids. Repeat lactic acid is normal. Urine culture significant for yeast and blood cultures significant for 1/2 GPC in clusters -Blood/urine cultures pending -Continue Vancomycin and Zosyn -Fluconazole added for UTI  Delirium In setting of septic shock. Resolved.  Skin ulcers Multiple. Wound care consulted. Recommending general surgery consult -MRI sacrum  Acute kidney injury In setting of septic shock. Patient with good urine output so far. Resolved.  Diabetes mellitus, type 2 Uncontrolled with hyperglycemia at home and hypoglycemia during admission. Continued hypoglycemia -Decrease to Lantus 8 units qhs (20% decrease) -Continue SSI (qAC/HS)  Hypokalemia -Potassium supplementation  Anemia In setting of significant fluid resuscitation. No active bleeding -Trend CBC  Left hemiplegia Chronic. Baseline.  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  Objective: Vitals:   02/11/18 0820 02/11/18 0900 02/11/18 1000 02/11/18 1100  BP: 140/73 115/67 116/65 (!) 118/92  Pulse: (!) 115 (!) 114 (!) 112 (!) 112  Resp: 16 19 17  (!) 29  Temp:      TempSrc:      SpO2: 100% 100% 100% 100%  Weight:      Height:        Intake/Output Summary (Last 24 hours) at 02/11/2018 1126 Last data filed at 02/11/2018 1100 Gross per 24 hour  Intake 6042.44 ml  Output 600 ml  Net 5442.44 ml   Filed Weights   02/10/18 0427  Weight: 112 kg    Examination:  General exam: Appears calm and comfortable  Respiratory system: Diminished breath sounds. Respiratory effort normal. Cardiovascular system: S1 & S2 heard, RRR. No murmurs, rubs, gallops or clicks. Gastrointestinal system: Abdomen is nondistended, soft and nontender. Normal bowel sounds heard. Central nervous system: Alert and oriented to person and place. Follows commands appropriately Extremities: No calf tenderness Skin: No cyanosis. No rashes Psychiatry: Judgement and insight appear normal. Flat affect   Data Reviewed: I have personally reviewed following labs and imaging studies  CBC: Recent Labs  Lab 02/10/18 0236 02/11/18 0607  WBC 23.8* 12.2*  NEUTROABS 18.5  --   HGB 10.3* 7.3*  HCT 31.3* 22.4*  MCV 86.0 86.8  PLT 923* 431*   Basic Metabolic Panel: Recent Labs  Lab 02/10/18 0236 02/11/18  4174  NA 131* 135  K 4.2 2.9*  CL 90* 106  CO2 24 22  GLUCOSE 191* 51*  BUN 41* 20  CREATININE 4.21* 0.96  CALCIUM 8.1* 7.0*   GFR: Estimated Creatinine Clearance: 76.1 mL/min (by C-G formula based on SCr of 0.96 mg/dL). Liver Function Tests: Recent Labs    Lab 02/10/18 0236  AST 38  ALT 27  ALKPHOS 129*  BILITOT 0.4  PROT 6.7  ALBUMIN 1.8*   No results for input(s): LIPASE, AMYLASE in the last 168 hours. No results for input(s): AMMONIA in the last 168 hours. Coagulation Profile: No results for input(s): INR, PROTIME in the last 168 hours. Cardiac Enzymes: Recent Labs  Lab 02/10/18 0236  TROPONINI <0.03   BNP (last 3 results) No results for input(s): PROBNP in the last 8760 hours. HbA1C: No results for input(s): HGBA1C in the last 72 hours. CBG: Recent Labs  Lab 02/11/18 0635 02/11/18 0700 02/11/18 0833 02/11/18 0922 02/11/18 0927  GLUCAP 51* 72 57* 61* 72   Lipid Profile: No results for input(s): CHOL, HDL, LDLCALC, TRIG, CHOLHDL, LDLDIRECT in the last 72 hours. Thyroid Function Tests: No results for input(s): TSH, T4TOTAL, FREET4, T3FREE, THYROIDAB in the last 72 hours. Anemia Panel: No results for input(s): VITAMINB12, FOLATE, FERRITIN, TIBC, IRON, RETICCTPCT in the last 72 hours. Sepsis Labs: Recent Labs  Lab 02/10/18 0300 02/10/18 0517 02/11/18 0607  LATICACIDVEN 3.56* 2.20* 1.2    Recent Results (from the past 240 hour(s))  Blood Culture (routine x 2)     Status: None (Preliminary result)   Collection Time: 02/10/18  2:36 AM  Result Value Ref Range Status   Specimen Description   Final    BLOOD LEFT ANTECUBITAL Performed at [facility] [address] [place], [place] [address]    Special Requests   Final    BOTTLES DRAWN AEROBIC AND ANAEROBIC Blood Culture adequate volume Performed at [facility] [address], [place], [place] [address]    Culture   Final    NO GROWTH 1 DAY Performed at [facility], [place] [address] [place], [place] [address]    Report Status PENDING  Incomplete  Urine culture     Status: Abnormal   Collection Time: 02/10/18  2:42 AM  Result Value Ref Range Status   Specimen Description   Final    URINE, RANDOM Performed at  [facility] [address] [place], [place] [address]    Special Requests   Final    NONE Performed at [facility], [place] [address] [place], [place] [address]    Culture >=100,000 COLONIES/mL YEAST (A)  Final   Report Status 02/11/2018 FINAL  Final  Blood Culture (routine x 2)     Status: None (Preliminary result)   Collection Time: 02/10/18  2:46 AM  Result Value Ref Range Status   Specimen Description   Final    BLOOD RIGHT ANTECUBITAL Performed at [facility] [address] [place], [place] [address]    Special Requests   Final    BOTTLES DRAWN AEROBIC AND ANAEROBIC Blood Culture adequate volume Performed at [facility] [address] [place], [place] [address]    Culture  Setup Time   Final    GRAM POSITIVE COCCI IN CLUSTERS AEROBIC BOTTLE ONLY Organism ID to follow CRITICAL RESULT CALLED TO, READ BACK BY AND VERIFIED WITH: B.GREEN,PHARMD AT 0620 ON 02/11/18 BY [email or web address]  Culture   Final    NO GROWTH 1 DAY Performed at [facility], [place] [address] [place], [place] [address]    Report Status PENDING  Incomplete  Blood Culture ID Panel (Reflexed)     Status: Abnormal   Collection Time: 02/10/18  2:46 AM  Result Value Ref Range Status   Enterococcus species NOT DETECTED NOT DETECTED Final   Listeria monocytogenes NOT DETECTED NOT DETECTED Final   Staphylococcus species DETECTED (A) NOT DETECTED Final    Comment: Methicillin (oxacillin) susceptible coagulase negative staphylococcus. Possible blood culture contaminant (unless isolated from more than one blood culture draw or clinical case suggests pathogenicity). No antibiotic treatment is indicated for blood  culture contaminants. CRITICAL RESULT CALLED TO, READ BACK BY AND VERIFIED WITH: B.GREEN,PHARMD AT 0620 ON 02/11/18 BY G.[email or web address]    Staphylococcus aureus NOT DETECTED NOT DETECTED Final   Methicillin resistance NOT DETECTED NOT  DETECTED Final   Streptococcus species NOT DETECTED NOT DETECTED Final   Streptococcus agalactiae NOT DETECTED NOT DETECTED Final   Streptococcus pneumoniae NOT DETECTED NOT DETECTED Final   Streptococcus pyogenes NOT DETECTED NOT DETECTED Final   Acinetobacter baumannii NOT DETECTED NOT DETECTED Final   Enterobacteriaceae species NOT DETECTED NOT DETECTED Final   Enterobacter cloacae complex NOT DETECTED NOT DETECTED Final   Escherichia coli NOT DETECTED NOT DETECTED Final   Klebsiella oxytoca NOT DETECTED NOT DETECTED Final   Klebsiella pneumoniae NOT DETECTED NOT DETECTED Final   Proteus species NOT DETECTED NOT DETECTED Final   Serratia marcescens NOT DETECTED NOT DETECTED Final   Haemophilus influenzae NOT DETECTED NOT DETECTED Final   Neisseria meningitidis NOT DETECTED NOT DETECTED Final   Pseudomonas aeruginosa NOT DETECTED NOT DETECTED Final   Candida albicans NOT DETECTED NOT DETECTED Final   Candida glabrata NOT DETECTED NOT DETECTED Final   Candida krusei NOT DETECTED NOT DETECTED Final   Candida parapsilosis NOT DETECTED NOT DETECTED Final   Candida tropicalis NOT DETECTED NOT DETECTED Final    Comment: Performed at [facility], [address], [place], [place] [address]         Radiology Studies: Ct Abdomen Pelvis Wo Contrast  Result Date: 02/10/2018 CLINICAL DATA:  Sepsis. Signs of urinary tract infection. EXAM: CT ABDOMEN AND PELVIS WITHOUT CONTRAST TECHNIQUE: Multidetector CT imaging of the abdomen and pelvis was performed following the standard protocol without IV contrast. COMPARISON:  None. FINDINGS: Lower chest: Minimal bibasilar atelectasis. Hepatobiliary: No focal liver abnormality is seen. Status post cholecystectomy. No biliary dilatation. Pancreas: Unremarkable. No pancreatic ductal dilatation or surrounding inflammatory changes. Spleen: Normal in size without focal abnormality. Adrenals/Urinary Tract: Normal appearing adrenal glands. 4 mm lower pole  right renal calculus. Normal appearing left kidney and ureters. Foley catheter in the urinary bladder. Stomach/Bowel: Prominent stool in the colon. Unremarkable stomach and small bowel. No evidence of appendicitis. Vascular/Lymphatic: Atheromatous arterial calcifications without aneurysm. No enlarged lymph nodes. Reproductive: Uterus and bilateral adnexa are unremarkable. Other: No abdominal wall hernia or abnormality. No abdominopelvic ascites. Musculoskeletal: Mild lumbar and lower thoracic spine degenerative changes. Mild bilateral hip degenerative changes. IMPRESSION: 1. No acute abnormality. 2. Prominent stool in the colon. 3. 4 mm nonobstructing lower pole right renal calculus. Electronically Signed   By: Claudie Revering M.D.   On: 02/10/2018 07:46   Dg Chest Port 1 View  Result Date: 02/10/2018 CLINICAL DATA:  62 y/o  F; altered mental status. EXAM: PORTABLE CHEST 1 VIEW COMPARISON:  None. FINDINGS: The heart size and  mediastinal contours are within normal limits. Both lungs are clear. ACDF hardware noted. No acute osseous abnormality is evident. Osteoarthrosis of the shoulder joints bilaterally. IMPRESSION: No active disease. Electronically Signed   By: Kristine Garbe M.D.   On: 02/10/2018 03:03        Scheduled Meds: . amitriptyline  100 mg Oral QHS  . collagenase   Topical Daily  . enoxaparin (LOVENOX) injection  40 mg Subcutaneous Q24H  . insulin regular  0-5 Units Subcutaneous QHS  . insulin regular  0-9 Units Subcutaneous TID WC  . levothyroxine  150 mcg Oral QAC breakfast  . multivitamin with minerals  1 tablet Oral Daily  . mupirocin cream   Topical Daily  . oxybutynin  15 mg Oral Daily  . pantoprazole  40 mg Oral Daily  . polyethylene glycol  17 g Oral BID  . potassium chloride  40 mEq Oral Q4H  . pravastatin  20 mg Oral Daily  . vitamin B-6  250 mg Oral Daily  . vitamin B-12  1,000 mcg Oral Daily  . vitamin E  400 Units Oral Daily   Continuous Infusions: . sodium  chloride    . [START ON 02/12/2018] fluconazole (DIFLUCAN) IV    . fluconazole (DIFLUCAN) IV 100 mL/hr at 02/11/18 1100  . lactated ringers Stopped (02/11/18 1047)  . piperacillin-tazobactam (ZOSYN)  IV    . vancomycin       LOS: 1 day     Cordelia Poche, MD [facility] 02/11/2018, 11:26 AM Pager: [phone]  If 7PM-7AM, please contact night-coverage [email or web address] 02/11/2018, 11:26 AM

## 2018-02-11 NOTE — Progress Notes (Signed)
Pt. Transferred to MRI with MRI techs. Orders for pt. To travel without RN and off telemetry per MD Net[place].

## 2018-02-11 NOTE — Evaluation (Signed)
Occupational Therapy Evaluation Patient Details Name: Natalie Macdonald MRN: [id number] DOB: [birth year] Today's Date: 02/11/2018    History of Present Illness 62 y.o. 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 She presented with altered mental status found to have septic shock.   Clinical Impression   This 62 y/o female presents with the above. OT consulted for evaluation of LUE deficits. Pt currently with decreased ROM to LUE and increased pain levels with PROM. Pt demonstrates ROM to L digits but is limited, with digits tending to rest in flexed position. Pt will benefit from L resting hand splint to maximize pt's functional use of UE and to prevent further contracture. Will continue to follow acutely for progression of LUE deficits and to ensure appropriate splinting needs.     Follow Up Recommendations  Supervision/Assistance - 24 hour;Other (comment)(pending level of available care at home, may need SNF )    Equipment Recommendations  None recommended by OT           Precautions / Restrictions Precautions Precautions: Fall Precaution Comments: sacral wound  Restrictions Weight Bearing Restrictions: No                                                    ADL either performed or assessed with clinical judgement   ADL Overall ADL's : Needs assistance/impaired                                       General ADL Comments: OT consulted for L hand/finger contractures (see extremity section for details), will benefit from LUE resting hand splint to reduce further risk of hand contractures; reports she typically uses RUE for self-feeding                         Pertinent Vitals/Pain Pain Assessment: Faces Faces Pain Scale: Hurts little more Pain Location: LUE with certain movements  Pain Descriptors / Indicators:  Grimacing;Guarding;Sore Pain Intervention(s): Monitored during session;Repositioned     Hand Dominance Right   Extremity/Trunk Assessment Upper Extremity Assessment Upper Extremity Assessment: LUE deficits/detail LUE Deficits / Details: pt with limited AROM in LUE; digits tend to rest in flexed position but demonstrates intermittent ability to extend/flex digits, minimally able to perform opposition, fluctuating spasticity noted in digits, pt reports numbness/tingling in UE   Lower Extremity Assessment Lower Extremity Assessment: Generalized weakness(not formally assessed this session )       Communication Communication Communication: No difficulties(soft spoke and some mumbled speech, hx of vocal cord paralysis )   Cognition Arousal/Alertness: Awake/alert;Lethargic Behavior During Therapy: Flat affect Overall Cognitive Status: No family/caregiver present to determine baseline cognitive functioning                                     General Comments       Exercises General Exercises - Upper Extremity Wrist Flexion: AROM;AAROM;Left;Supine Wrist Extension: AROM;AAROM;Left;Supine Digit Composite Flexion: AROM;AAROM;PROM;Left;Supine Composite Extension: AROM;AAROM;PROM;Left;Supine   Shoulder Instructions      Home Living Family/patient expects to be discharged to:: Private residence Living Arrangements: Spouse/significant other  Prior Functioning/Environment Level of Independence: Needs assistance  Gait / Transfers Assistance Needed: per RN pt is mostly w/c and bed bound ADL's / Homemaking Assistance Needed: requires assist for all ADLs             OT Problem List: Decreased strength;Impaired UE functional use;Decreased range of motion      OT Treatment/Interventions: Self-care/ADL training;DME and/or AE instruction;Splinting;Patient/family education    OT Goals(Current goals can be found in the  care plan section) Acute Rehab OT Goals Patient Stated Goal: none stated  OT Goal Formulation: With patient Time For Goal Achievement: 02/25/18 Potential to Achieve Goals: Fair  OT Frequency: Min 2X/week   Barriers to D/C:            Co-evaluation              AM-PAC PT "6 Clicks" Daily Activity     Outcome Measure Help from another person eating meals?: A Little Help from another person taking care of personal grooming?: A Lot Help from another person toileting, which includes using toliet, bedpan, or urinal?: Total Help from another person bathing (including washing, rinsing, drying)?: A Lot Help from another person to put on and taking off regular upper body clothing?: A Lot Help from another person to put on and taking off regular lower body clothing?: Total 6 Click Score: 11   End of Session Nurse Communication: Mobility status;Other (comment)(need for L hand splint )  Activity Tolerance: Patient tolerated treatment well Patient left: in bed;with call bell/phone within reach  OT Visit Diagnosis: Muscle weakness (generalized) (M62.81)                Time: 4193-7902 OT Time Calculation (min): 12 min Charges:  OT General Charges $OT Visit: 1 Visit OT Evaluation $OT Eval Moderate Complexity: 1 Mod  Lou Cal, [place] Pager [phone] 02/11/2018   Raymondo Band 02/11/2018, 1:22 PM

## 2018-02-11 NOTE — Progress Notes (Signed)
Attempted to get pt for MRI. Pt refused to come down. RN witnessed. Pt states she wants her husband to be here before coming down for her test.

## 2018-02-11 NOTE — Progress Notes (Addendum)
Natalie Macdonald  CWC:[id number] DOB: May 14, 1956 DOA: 02/10/2018 PCP: Clifton Custard, MD    Brief Narrative:  The patient is a 62 year old woman with prior episodes of sepsis attributed to decubitus ulcer from hemiparesis and immobility.   8/11 with confusion and decreased oral intake.  Noted to be hypotensive to SBP 70's and AKI with creatinine of 4.0 and an elevated lactate at 3.5. PCCM Called to see for persistent hypotension despite 3L of fluid resuscitation. 8/12: Received 1 extra liter of fluid last night.  Mean arterial pressure above 65 on a.m. rounds.  Urine culture showing greater than 100,000 colonies of yeast, blood with 1 out of 4 GPC's.  Remains encephalopathic.  Seen by wound therapy who suggests surgical evaluation  Subjective: Awake, multiple complaints.  Hemodynamically stable.  Assessment & Plan:  Septic shock, presumably due to infected decubitus ulcer, likely further c/b UTI (yeast) +/- bacteremia Hemodynamically stable w/ exception on mild tachycardia. This may be more d/t pain. Has chronic foley Plan Day # 2 vanc and zosyn  Start diflucan; removing foley; try pure wick  Needs surgical consult for sacral wound debridement  Cont MIVFs. Will change to balanced crystalloids   Sacral wound infection.  Plan Surgical consult recommended  Acute kidney injury: normalized w/ IVFs Plan Renal dose meds Cont IVFs Cont strict I&O Am chemistry   Fluid and electrolyte imbalance: hypokalemia  Plan Replaced and recheck   Acute encephalopathy Plan Supportive care Treat infection  Limit sedation  Frequent re-orientation   Hypoglycemia Plan Dc lantus Change to sensitive scale Ck cortisol  Protein calorie malnutrition  Plan Needs nutritional eval   Anemia w/out evidence of bleeding.  hgb has dropped from 10.3 to 7.3 but I think that must be at least somewhat due to prior hemoconcentration.  Plan Trend cbc Watch cor s/sx of bleeding  Leukocytosis d/t  sepsis Improved.  Plan Cont abx and trend   Thrombocytosis d/t sepsis-->improved Plan Trend cbc  Hypothyroidism  Plan Cont synthroid   Preventative quality  DVT prophylaxis: lmwh SUP:  Oral ppi Diet: regular  Activity: BR Code status: full code    Disposition : ICU-->critical care will sign off   Micro data:  Urine culture 8/11: > 100K yeast BCX2 8/11: (1 of 4) GPC clusters>>>  Antimicrobials:  Vancomycin and Zosyn started 02/09/18   Objective: Blood pressure (Abnormal) 130/113, pulse (Abnormal) 116, temperature 97.7 F (36.5 C), temperature source Oral, resp. rate (Abnormal) 21, height 5\' 6"  (1.676 m), weight 112 kg, SpO2 100 %.        Intake/Output Summary (Last 24 hours) at 02/11/2018 0819 Last data filed at 02/11/2018 0600 Gross per 24 hour  Intake 5217.24 ml  Output 900 ml  Net 4317.24 ml   Filed Weights   02/10/18 0427  Weight: 112 kg    Examination: General: Awake, no acute distress.  Somewhat confused. HENT: Nonicteric, no JVD, mucous membranes moist.   Lungs: Clear to auscultation without accessory use Cardiovascular: Sinus tachycardia on telemetry.  Regular rate and rhythm without murmur rub or gallop.  Brisk capillary refill Abdomen: Soft, nontender.  Positive bowel sounds.  No organomegaly. Extremities: Chronic lower extremity edema.  Soft tissue injury to left heel.   Neuro: Chronic left-sided weakness.  She is awake, oriented x1-2.  Rambles off, then reoriented briefly.   GU: Clear yellow via Foley catheter.  CBC: Recent Labs  Lab 02/10/18 0236 02/11/18 0607  WBC 23.8* 12.2*  NEUTROABS 18.5  --   HGB  10.3* 7.3*  HCT 31.3* 22.4*  MCV 86.0 86.8  PLT 923* 741*   Basic Metabolic Panel: Recent Labs  Lab 02/10/18 0236 02/11/18 0607  NA 131* 135  K 4.2 2.9*  CL 90* 106  CO2 24 22  GLUCOSE 191* 51*  BUN 41* 20  CREATININE 4.21* 0.96  CALCIUM 8.1* 7.0*   GFR: Estimated Creatinine Clearance: 76.1 mL/min (by C-G formula based on  SCr of 0.96 mg/dL). Recent Labs  Lab 02/10/18 0236 02/10/18 0300 02/10/18 0517 02/11/18 0607  WBC 23.8*  --   --  12.2*  LATICACIDVEN  --  3.56* 2.20* 1.2   Liver Function Tests: Recent Labs  Lab 02/10/18 0236  AST 38  ALT 27  ALKPHOS 129*  BILITOT 0.4  PROT 6.7  ALBUMIN 1.8*   No results for input(s): LIPASE, AMYLASE in the last 168 hours. No results for input(s): AMMONIA in the last 168 hours.  Coagulation Profile: No results for input(s): INR, PROTIME in the last 168 hours.  Cardiac Enzymes: Recent Labs  Lab 02/10/18 0236  TROPONINI <0.03    HbA1C: Hgb A1c MFr Bld  Date/Time Value Ref Range Status  01/03/2018 01:26 AM 9.0 (H) 4.8 - 5.6 % Final    Comment:    (NOTE) Pre diabetes:          5.7%-6.4% Diabetes:              >6.4% Glycemic control for   <7.0% adults with diabetes   10/10/2016 02:23 PM 9.3 (H) 4.8 - 5.6 % Final    Comment:    (NOTE)         Pre-diabetes: 5.7 - 6.4         Diabetes: >6.4         Glycemic control for adults with diabetes: <7.0     CBG: Recent Labs  Lab 02/10/18 1150 02/10/18 1554 02/10/18 2052 02/11/18 0635 02/11/18 0700  GLUCAP 141* 214* 119* 51* 72      LOS: 1 day    Erick Colace ACNP-BC [place] Pager # [phone] OR # [phone] if no answer

## 2018-02-11 NOTE — Consult Note (Signed)
Reason for Consult: sepsis with decubitus Referring Physician: Dr. Ewing Schlein  Natalie Macdonald is an 62 y.o. female.  HPI: Patient is a 62 year old female most recently hospitalized 7/4 -01/10/2018 with sepsis, and encephalopathy.  She is wheelchair-bound since a cervical surgery and had a decubitus at that time.  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  Wound cultures at that time showed MRSA she was treated with 8 days of IV vancomycin and Zosyn.  ID consult was requested but the patient and her husband refused exam by ID.  She also refused initial examination of her sacral decubitus.  Blood cultures and urine cultures were negative.  Patient was referred to a primary care doctor at a wound center on discharge 01/10/2018.  Patient was returned from home yesterday by EMS again with altered mental status.  Patient's orientation in the ED was questionable.  Family reported she "isn't acting right."  She has multiple ulcers reported on her coccyx and her shoulder blades on her feet, left anterior thigh, noted by the Emergency Department Triage nurse.  Dr. Venora Maples saw her in the ED and reports the family says she is normally alert and oriented and able to verbalize and eats a regular diet.  In the ED she was found to be in shock with blood pressure 61/43 low-grade temperature 100.6 heart rate 122, white count 23.8.  Creatinine was 4.2 from a baseline of 0.7 BUN was 41.  Sacral decubitus was all smelling with brownish drainage.  The husband reports that this has a new odor to it.  UA was suggestive of a UTI or just ATN.  Lactate on admission was 3.5.  She was admitted by Medicine and placed in the ICU.  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  Wheelchair-bound at home.  Additional issues include diabetes, hypertension, hypothyroid, sarcoidosis,  and hyperlipidemia.  Admission diagnosis at this time is septic shock, delirium, multiple skin ulcers, acute kidney injury uncontrolled diabetes.  Currently she is afebrile but tachycardic.  She is undergoing fluid replacement; she is not on any pressors at current time.  Labs this AM shows potassium of 2.9, creatinine has gone from 4.21 back to 0.96.  Lactate is gone from 3.5 to 2.20, then down to 1.2 this morning.  WBC is 12.2, hemoglobin 7.3, hematocrit 22.4, platelets are normal at 598,000.  CXR unremarkable.  CT of the abdomen the pelvis shows no acute abnormalities prominent stool in the colon and a 4 mm nonobstructing lower pole right renal calculus.  She also had mild bilateral hip degenerative changes.  She is scheduled for an MRI of the sacrum with and without contrast today. We are asked to see and evaluate her decubitus ulcers.   Past Medical History:  Diagnosis Date  . Allergic rhinitis   . Arthritis    knees, shoulder  . Bronchitis    history  . Cellulitis of left foot   . Cervical disc disorder   . 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 Chronic osteomyelitis ([place])    left foot  . Chronic respiratory infection   . Diabetes mellitus without complication ([place])   . Diabetes mellitus, type II ([place])   . Diabetic foot ulcer ([place])    left foot  . Fibromyalgia   . GERD (gastroesophageal reflux disease)   . Headache    sinus  . History  of blood transfusion   . Hypertension   . Hyponatremia   . hypothyroid   . Hypothyroidism   . Joint pain    bilateral legs  . Left hemiplegia ([place])   . Sarcoidosis   . Sepsis ([place])    HX  . Sepsis ([place])   . Uterine fibroid    hx  . Uterine fibroid     Past Surgical History:  Procedure Laterality Date  . ANTERIOR CERVICAL DECOMP/DISCECTOMY FUSION N/A 10/17/2016   Procedure: ANTERIOR CERVICAL DECOMPRESSION/DISCECTOMY FUSION CERVICAL FIVE- CERVICAL SIX, CERVICAL SIX- CERVICAL SEVEN, POSSIBLE CERVICAL SIX  CORPECTOMY;  Surgeon: Consuella Lose, MD;  Location: [facility];  Service: Neurosurgery;  Laterality: N/A;  . ANTERIOR CERVICAL DECOMP/DISCECTOMY FUSION    . APPLICATION OF WOUND VAC Left    foot  . CHOLECYSTECTOMY    . FOOT SURGERY Bilateral    implants  . IRRIGATION AND DEBRIDEMENT FOOT Left    X2  . KNEE ARTHROPLASTY    . THYROIDECTOMY    . UTERINE FIBROID SURGERY      Family History  Problem Relation Age of Onset  . Throat cancer Mother     Social History:  reports that she has never smoked. She has never used smokeless tobacco. She reports that she does not drink alcohol or use drugs.  Allergies:  Allergies  Allergen Reactions  . Grapefruit Bioflavonoid Complex Hives, Swelling and Other (See Comments)    Caused her to get hives, swelling of the eye, throat   . Latex Hives  . Procaine Anaphylaxis  . Procaine Anaphylaxis  . Grapefruit Flavor [Flavoring Agent]   . Novolog [Insulin Aspart]     Okay to use Regular insulin (patient doesn't tolerate Novolog)      Medications:  Prior to Admission:  Medications Prior to Admission  Medication Sig Dispense Refill Last Dose  . amitriptyline (ELAVIL) 100 MG tablet Take 100 mg by mouth at bedtime.    Past Week at Unknown time  . amLODipine-benazepril (LOTREL) 5-40 MG capsule Take 1 capsule by mouth daily.   Past Week at Unknown time  . APIDRA 100 UNIT/ML injection Inject 18-25 Units into the skin 3 (three) times daily with meals. Sliding Scale  3 02/09/2018 at Unknown time  . baclofen (LIORESAL) 10 MG tablet Take 10 mg by mouth 3 (three) times daily as needed for muscle spasms.   0 Past Week at Unknown time  . Calcium Carbonate (CALCIUM 600 PO) Take 600 mg by mouth daily.   Past Week at Unknown time  . carvedilol (COREG) 25 MG tablet Take 25 mg by mouth 2 (two) times daily with a meal.   Past Week at 8pm  . cetirizine (ZYRTEC) 10 MG tablet Take 10 mg by mouth daily as needed for allergies.    Past Week at Unknown time  .  chlorthalidone (HYGROTON) 25 MG tablet Take 25 mg by mouth daily.   Past Week at Unknown time  . CRANBERRY PO Take 2 capsules by mouth daily.   Past Week at Unknown time  . diclofenac (VOLTAREN) 75 MG EC tablet Take 75 mg by mouth 2 (two) times daily.  1 Past Week at Unknown time  . doxycycline (DORYX) 100 MG EC tablet Take 100 mg by mouth 2 (two) times daily.  0 Past Week at Unknown time  . DULoxetine (CYMBALTA) 30 MG capsule Take 60 mg by mouth daily.  2 Past Week at Unknown time  . fluticasone (FLONASE) 50 MCG/ACT nasal spray Place 2  sprays into both nostrils daily as needed for allergies.    Past Week at Unknown time  . gabapentin (NEURONTIN) 300 MG capsule Take 300 mg by mouth 3 (three) times daily as needed (for pain).    Past Week at Unknown time  . levothyroxine (SYNTHROID, LEVOTHROID) 150 MCG tablet Take 150 mcg by mouth daily before breakfast.   Past Week at Unknown time  . metFORMIN (GLUCOPHAGE) 500 MG tablet Take 500 mg by mouth 2 (two) times daily with a meal.   Past Week at Unknown time  . morphine (MS CONTIN) 30 MG 12 hr tablet Take 30 mg by mouth 2 (two) times daily.  0 02/09/2018 at Unknown time  . Multiple Vitamin (MULTIVITAMIN) tablet Take 1 tablet by mouth daily.   Past Week at Unknown time  . oxybutynin (DITROPAN XL) 15 MG 24 hr tablet Take 15 mg by mouth daily.   0 Past Week at Unknown time  . Oxycodone HCl 10 MG TABS Take 10 mg by mouth every 8 (eight) hours as needed (for pain).   0 02/09/2018 at Unknown time  . pantoprazole (PROTONIX) 40 MG tablet Take 40 mg by mouth daily.   Past Week at Unknown time  . pravastatin (PRAVACHOL) 20 MG tablet Take 20 mg by mouth daily.   Past Week at Unknown time  . Probiotic Product (PROBIOTIC PO) Take 2 capsules by mouth daily.   Past Week at Unknown time  . Pyridoxine HCl (VITAMIN B-6) 250 MG tablet Take 250 mg by mouth daily.   Past Week at Unknown time  . ranitidine (ZANTAC) 75 MG tablet Take 150 mg by mouth daily as needed for heartburn.    Past Week at Unknown time  . silver sulfADIAZINE (SILVADENE) 1 % cream Apply 1 application topically 2 (two) times daily.   0 Past Week at Unknown time  . tiZANidine (ZANAFLEX) 4 MG tablet Take 4 mg by mouth every 8 (eight) hours as needed for muscle spasms.   2 Past Week at Unknown time  . TOUJEO SOLOSTAR 300 UNIT/ML SOPN Inject 40-50 Units into the skin at bedtime. Sliding Scale  0 02/09/2018 at Unknown time  . vitamin B-12 (CYANOCOBALAMIN) 1000 MCG tablet Take 1,000 mcg by mouth daily.   Past Week at Unknown time  . vitamin E 400 UNIT capsule Take 400 Units by mouth daily.   Past Week at Unknown time   Scheduled: . amitriptyline  100 mg Oral QHS  . collagenase   Topical Daily  . enoxaparin (LOVENOX) injection  40 mg Subcutaneous Q24H  . insulin regular  0-5 Units Subcutaneous QHS  . insulin regular  0-9 Units Subcutaneous TID WC  . levothyroxine  150 mcg Oral QAC breakfast  . multivitamin with minerals  1 tablet Oral Daily  . mupirocin cream   Topical Daily  . oxybutynin  15 mg Oral Daily  . pantoprazole  40 mg Oral Daily  . polyethylene glycol  17 g Oral BID  . potassium chloride  40 mEq Oral Q4H  . pravastatin  20 mg Oral Daily  . vitamin B-6  250 mg Oral Daily  . vitamin B-12  1,000 mcg Oral Daily  . vitamin E  400 Units Oral Daily   Continuous: . sodium chloride    . [START ON 02/12/2018] fluconazole (DIFLUCAN) IV    . fluconazole (DIFLUCAN) IV 100 mL/hr at 02/11/18 1100  . lactated ringers Stopped (02/11/18 1047)  . piperacillin-tazobactam (ZOSYN)  IV    . vancomycin  Anti-infectives (From admission, onward)   Start     Dose/Rate Route Frequency Ordered Stop   02/12/18 1000  fluconazole (DIFLUCAN) IVPB 100 mg     100 mg 50 mL/hr over 60 Minutes Intravenous Every 24 hours 02/11/18 0902     02/11/18 1600  vancomycin (VANCOCIN) 1,250 mg in sodium chloride 0.9 % 250 mL IVPB     1,250 mg 166.7 mL/hr over 90 Minutes Intravenous Every 24 hours 02/11/18 0801     02/11/18  1200  piperacillin-tazobactam (ZOSYN) IVPB 3.375 g     3.375 g 12.5 mL/hr over 240 Minutes Intravenous Every 8 hours 02/11/18 0800     02/11/18 1000  fluconazole (DIFLUCAN) IVPB 200 mg     200 mg 100 mL/hr over 60 Minutes Intravenous  Once 02/11/18 0902     02/10/18 1000  piperacillin-tazobactam (ZOSYN) IVPB 2.25 g  Status:  Discontinued     2.25 g 100 mL/hr over 30 Minutes Intravenous Every 6 hours 02/10/18 0433 02/11/18 0800   02/10/18 0432  vancomycin variable dose per unstable renal function (pharmacist dosing)  Status:  Discontinued      Does not apply See admin instructions 02/10/18 0433 02/11/18 0802   02/10/18 0245  piperacillin-tazobactam (ZOSYN) IVPB 3.375 g     3.375 g 100 mL/hr over 30 Minutes Intravenous  Once 02/10/18 0231 02/10/18 0337   02/10/18 0245  vancomycin (VANCOCIN) IVPB 1000 mg/200 mL premix  Status:  Discontinued     1,000 mg 200 mL/hr over 60 Minutes Intravenous  Once 02/10/18 0231 02/10/18 0242   02/10/18 0245  vancomycin (VANCOCIN) 2,000 mg in sodium chloride 0.9 % 500 mL IVPB     2,000 mg 250 mL/hr over 120 Minutes Intravenous  Once 02/10/18 0242 02/10/18 0645      Results for orders placed or performed during the hospital encounter of 02/10/18 (from the past 48 hour(s))  Comprehensive metabolic panel     Status: Abnormal   Collection Time: 02/10/18  2:36 AM  Result Value Ref Range   Sodium 131 (L) 135 - 145 mmol/L   Potassium 4.2 3.5 - 5.1 mmol/L   Chloride 90 (L) 98 - 111 mmol/L   CO2 24 22 - 32 mmol/L   Glucose, Bld 191 (H) 70 - 99 mg/dL   BUN 41 (H) 8 - 23 mg/dL   Creatinine, Ser 4.21 (H) 0.44 - 1.00 mg/dL   Calcium 8.1 (L) 8.9 - 10.3 mg/dL   Total Protein 6.7 6.5 - 8.1 g/dL   Albumin 1.8 (L) 3.5 - 5.0 g/dL   AST 38 15 - 41 U/L   ALT 27 0 - 44 U/L   Alkaline Phosphatase 129 (H) 38 - 126 U/L   Total Bilirubin 0.4 0.3 - 1.2 mg/dL   GFR calc non Af Amer 10 (L) >60 mL/min   GFR calc Af Amer 12 (L) >60 mL/min    Comment: (NOTE) The eGFR has been  calculated using the CKD EPI equation. This calculation has not been validated in all clinical situations. eGFR's persistently <60 mL/min signify possible Chronic Kidney Disease.    Anion gap 17 (H) 5 - 15    Comment: Performed at [facility], [place] [address], [place], [place] [address]  CBC WITH DIFFERENTIAL     Status: Abnormal   Collection Time: 02/10/18  2:36 AM  Result Value Ref Range   WBC 23.8 (H) 4.0 - 10.5 K/uL   RBC 3.64 (L) 3.87 - 5.11 MIL/uL   Hemoglobin 10.3 (  L) 12.0 - 15.0 g/dL   HCT 31.3 (L) 36.0 - 46.0 %   MCV 86.0 78.0 - 100.0 fL   MCH 28.3 26.0 - 34.0 pg   MCHC 32.9 30.0 - 36.0 g/dL   RDW 15.6 (H) 11.5 - 15.5 %   Platelets 923 (HH) 150 - 400 K/uL    Comment: REPEATED TO VERIFY SPECIMEN CHECKED FOR CLOTS PLATELET COUNT CONFIRMED BY SMEAR CRITICAL RESULT CALLED TO, READ BACK BY AND VERIFIED WITH: FARRAR,S RN AT 0335 02/10/18 BY TIBBITTS,K    Neutrophils Relative % 77 %   Neutro Abs 18.5 1.7 - 7.7 K/uL   Lymphocytes Relative 15 %   Lymphs Abs 3.6 0.7 - 4.0 K/uL   Monocytes Relative 7 %   Monocytes Absolute 1.6 0.1 - 1.0 K/uL   Eosinophils Relative 1 %   Eosinophils Absolute 0.1 0.0 - 0.7 K/uL   Basophils Relative 0 %   Basophils Absolute 0.0 0.0 - 0.1 K/uL   WBC Morphology MILD LEFT SHIFT (1-5% METAS, OCC MYELO, OCC BANDS)     Comment: Performed at [facility], [place] [address] [place], [place] [address]  Troponin I     Status: None   Collection Time: 02/10/18  2:36 AM  Result Value Ref Range   Troponin I <0.03 <0.03 ng/mL    Comment: Performed at [facility], [place] [address] [place], [place] [address]  Urinalysis, Routine w reflex microscopic     Status: Abnormal   Collection Time: 02/10/18  2:42 AM  Result Value Ref Range   Color, Urine AMBER (A) YELLOW    Comment: BIOCHEMICALS MAY BE AFFECTED BY COLOR   APPearance HAZY (A) CLEAR   Specific Gravity, Urine 1.019 1.005 - 1.030   pH 5.0 5.0 - 8.0     Glucose, UA NEGATIVE NEGATIVE mg/dL   Hgb urine dipstick NEGATIVE NEGATIVE   Bilirubin Urine NEGATIVE NEGATIVE   Ketones, ur NEGATIVE NEGATIVE mg/dL   Protein, ur NEGATIVE NEGATIVE mg/dL   Nitrite NEGATIVE NEGATIVE   Leukocytes, UA MODERATE (A) NEGATIVE   RBC / HPF 21-50 0 - 5 RBC/hpf   WBC, UA 21-50 0 - 5 WBC/hpf   Bacteria, UA RARE (A) NONE SEEN   Squamous Epithelial / LPF 6-10 0 - 5   WBC Clumps PRESENT    Mucus PRESENT    Budding Yeast PRESENT    Hyaline Casts, UA PRESENT     Comment: Performed at [facility], [place] [address] [place], [place] [address]  Urine culture     Status: Abnormal   Collection Time: 02/10/18  2:42 AM  Result Value Ref Range   Specimen Description      URINE, RANDOM Performed at [place] [address], [place], [place] [address]    Special Requests      NONE Performed at [facility], [place] [address] [place], [place] [address]    Culture >=100,000 COLONIES/mL YEAST (A)    Report Status 02/11/2018 FINAL   Blood Culture (routine x 2)     Status: None (Preliminary result)   Collection Time: 02/10/18  2:46 AM  Result Value Ref Range   Specimen Description      BLOOD RIGHT ANTECUBITAL Performed at [facility] [address] [place], [place] [address]    Special Requests      BOTTLES DRAWN AEROBIC AND ANAEROBIC Blood Culture adequate volume Performed at [facility] [address]  [place], [place] [address]    Culture  Setup Time      GRAM POSITIVE COCCI IN CLUSTERS AEROBIC BOTTLE ONLY Organism ID to follow CRITICAL RESULT CALLED TO, READ BACK BY AND VERIFIED WITH: B.GREEN,PHARMD AT 0620 ON 02/11/18 BY [email or web address] Performed at [facility], [place] [address], [place], [place] [address]    Culture PENDING    Report Status PENDING   Blood Culture ID Panel (Reflexed)     Status: Abnormal   Collection Time: 02/10/18  2:46 AM  Result Value Ref  Range   Enterococcus species NOT DETECTED NOT DETECTED   Listeria monocytogenes NOT DETECTED NOT DETECTED   Staphylococcus species DETECTED (A) NOT DETECTED    Comment: Methicillin (oxacillin) susceptible coagulase negative staphylococcus. Possible blood culture contaminant (unless isolated from more than one blood culture draw or clinical case suggests pathogenicity). No antibiotic treatment is indicated for blood  culture contaminants. CRITICAL RESULT CALLED TO, READ BACK BY AND VERIFIED WITH: B.GREEN,PHARMD AT 0620 ON 02/11/18 BY G.[email or web address]    Staphylococcus aureus NOT DETECTED NOT DETECTED   Methicillin resistance NOT DETECTED NOT DETECTED   Streptococcus species NOT DETECTED NOT DETECTED   Streptococcus agalactiae NOT DETECTED NOT DETECTED   Streptococcus pneumoniae NOT DETECTED NOT DETECTED   Streptococcus pyogenes NOT DETECTED NOT DETECTED   Acinetobacter baumannii NOT DETECTED NOT DETECTED   Enterobacteriaceae species NOT DETECTED NOT DETECTED   Enterobacter cloacae complex NOT DETECTED NOT DETECTED   Escherichia coli NOT DETECTED NOT DETECTED   Klebsiella oxytoca NOT DETECTED NOT DETECTED   Klebsiella pneumoniae NOT DETECTED NOT DETECTED   Proteus species NOT DETECTED NOT DETECTED   Serratia marcescens NOT DETECTED NOT DETECTED   Haemophilus influenzae NOT DETECTED NOT DETECTED   Neisseria meningitidis NOT DETECTED NOT DETECTED   Pseudomonas aeruginosa NOT DETECTED NOT DETECTED   Candida albicans NOT DETECTED NOT DETECTED   Candida glabrata NOT DETECTED NOT DETECTED   Candida krusei NOT DETECTED NOT DETECTED   Candida parapsilosis NOT DETECTED NOT DETECTED   Candida tropicalis NOT DETECTED NOT DETECTED    Comment: Performed at [facility], [place] [address] [place], [place] [address]  I-Stat CG4 Lactic Acid, ED  (not at  [facility])     Status: Abnormal   Collection Time: 02/10/18  3:00 AM  Result Value Ref Range   Lactic Acid, Venous 3.56 (HH) 0.5 - 1.9 mmol/L   Comment  NOTIFIED PHYSICIAN   I-Stat CG4 Lactic Acid, ED  (not at  [facility])     Status: Abnormal   Collection Time: 02/10/18  5:17 AM  Result Value Ref Range   Lactic Acid, Venous 2.20 (HH) 0.5 - 1.9 mmol/L   Comment NOTIFIED PHYSICIAN   Glucose, capillary     Status: Abnormal   Collection Time: 02/10/18  8:34 AM  Result Value Ref Range   Glucose-Capillary 41 (LL) 70 - 99 mg/dL   Comment 1 Document in Chart   Glucose, capillary     Status: Abnormal   Collection Time: 02/10/18  8:59 AM  Result Value Ref Range   Glucose-Capillary 44 (LL) 70 - 99 mg/dL   Comment 1 Document in Chart   Glucose, capillary     Status: Abnormal   Collection Time: 02/10/18  9:34 AM  Result Value Ref Range   Glucose-Capillary 116 (H) 70 - 99 mg/dL  Glucose, capillary     Status: Abnormal   Collection Time: 02/10/18 11:50 AM  Result Value Ref Range   Glucose-Capillary 141 (H) 70 -  99 mg/dL  Glucose, capillary     Status: Abnormal   Collection Time: 02/10/18  3:54 PM  Result Value Ref Range   Glucose-Capillary 214 (H) 70 - 99 mg/dL  Glucose, capillary     Status: Abnormal   Collection Time: 02/10/18  8:52 PM  Result Value Ref Range   Glucose-Capillary 119 (H) 70 - 99 mg/dL  CBC     Status: Abnormal   Collection Time: 02/11/18  6:07 AM  Result Value Ref Range   WBC 12.2 (H) 4.0 - 10.5 K/uL   RBC 2.58 (L) 3.87 - 5.11 MIL/uL   Hemoglobin 7.3 (L) 12.0 - 15.0 g/dL    Comment: REPEATED TO VERIFY DELTA CHECK NOTED    HCT 22.4 (L) 36.0 - 46.0 %   MCV 86.8 78.0 - 100.0 fL   MCH 28.3 26.0 - 34.0 pg   MCHC 32.6 30.0 - 36.0 g/dL   RDW 16.0 (H) 11.5 - 15.5 %   Platelets 598 (H) 150 - 400 K/uL    Comment: Performed at [facility], [place] [address] [place], [place] [address]  Basic metabolic panel     Status: Abnormal   Collection Time: 02/11/18  6:07 AM  Result Value Ref Range   Sodium 135 135 - 145 mmol/L   Potassium 2.9 (L) 3.5 - 5.1 mmol/L    Comment: DELTA CHECK NOTED   Chloride 106 98 - 111  mmol/L   CO2 22 22 - 32 mmol/L   Glucose, Bld 51 (L) 70 - 99 mg/dL   BUN 20 8 - 23 mg/dL   Creatinine, Ser 0.96 0.44 - 1.00 mg/dL    Comment: DELTA CHECK NOTED   Calcium 7.0 (L) 8.9 - 10.3 mg/dL   GFR calc non Af Amer >60 >60 mL/min   GFR calc Af Amer >60 >60 mL/min    Comment: (NOTE) The eGFR has been calculated using the CKD EPI equation. This calculation has not been validated in all clinical situations. eGFR's persistently <60 mL/min signify possible Chronic Kidney Disease.    Anion gap 7 5 - 15    Comment: Performed at [facility], [place] [address], [place], [place] [address]  Lactic acid, plasma     Status: None   Collection Time: 02/11/18  6:07 AM  Result Value Ref Range   Lactic Acid, Venous 1.2 0.5 - 1.9 mmol/L    Comment: Performed at [facility], [place] [address] [place], [place] [address]  Cortisol     Status: None   Collection Time: 02/11/18  6:07 AM  Result Value Ref Range   Cortisol, Plasma 10.6 ug/dL    Comment: (NOTE) AM    6.7 - 22.6 ug/dL PM   <10.0       ug/dL Performed at [place] [address] [place], [place] [address]   C-reactive protein     Status: Abnormal   Collection Time: 02/11/18  6:07 AM  Result Value Ref Range   CRP 15.3 (H) <1.0 mg/dL    Comment: Performed at [place] [address], [place], [place] [address]  Glucose, capillary     Status: Abnormal   Collection Time: 02/11/18  6:35 AM  Result Value Ref Range   Glucose-Capillary 51 (L) 70 - 99 mg/dL  Glucose, capillary     Status: None   Collection Time: 02/11/18  7:00 AM  Result Value Ref Range   Glucose-Capillary 72 70 - 99 mg/dL  Glucose, capillary     Status: Abnormal   Collection Time: 02/11/18  8:33 AM  Result Value Ref Range   Glucose-Capillary 57 (L) 70 - 99 mg/dL  Glucose, capillary     Status: Abnormal   Collection Time: 02/11/18  9:22 AM  Result Value Ref Range   Glucose-Capillary 61 (L) 70 - 99 mg/dL    Glucose, capillary     Status: None   Collection Time: 02/11/18  9:27 AM  Result Value Ref Range   Glucose-Capillary 72 70 - 99 mg/dL    Ct Abdomen Pelvis Wo Contrast  Result Date: 02/10/2018 CLINICAL DATA:  Sepsis. Signs of urinary tract infection. EXAM: CT ABDOMEN AND PELVIS WITHOUT CONTRAST TECHNIQUE: Multidetector CT imaging of the abdomen and pelvis was performed following the standard protocol without IV contrast. COMPARISON:  None. FINDINGS: Lower chest: Minimal bibasilar atelectasis. Hepatobiliary: No focal liver abnormality is seen. Status post cholecystectomy. No biliary dilatation. Pancreas: Unremarkable. No pancreatic ductal dilatation or surrounding inflammatory changes. Spleen: Normal in size without focal abnormality. Adrenals/Urinary Tract: Normal appearing adrenal glands. 4 mm lower pole right renal calculus. Normal appearing left kidney and ureters. Foley catheter in the urinary bladder. Stomach/Bowel: Prominent stool in the colon. Unremarkable stomach and small bowel. No evidence of appendicitis. Vascular/Lymphatic: Atheromatous arterial calcifications without aneurysm. No enlarged lymph nodes. Reproductive: Uterus and bilateral adnexa are unremarkable. Other: No abdominal wall hernia or abnormality. No abdominopelvic ascites. Musculoskeletal: Mild lumbar and lower thoracic spine degenerative changes. Mild bilateral hip degenerative changes. IMPRESSION: 1. No acute abnormality. 2. Prominent stool in the colon. 3. 4 mm nonobstructing lower pole right renal calculus. Electronically Signed   By: Claudie Revering M.D.   On: 02/10/2018 07:46   Dg Chest Port 1 View  Result Date: 02/10/2018 CLINICAL DATA:  62 y/o  F; altered mental status. EXAM: PORTABLE CHEST 1 VIEW COMPARISON:  None. FINDINGS: The heart size and mediastinal contours are within normal limits. Both lungs are clear. ACDF hardware noted. No acute osseous abnormality is evident. Osteoarthrosis of the shoulder joints bilaterally.  IMPRESSION: No active disease. Electronically Signed   By: Kristine Garbe M.D.   On: 02/10/2018 03:03    Review of Systems  Unable to perform ROS: Mental status change     Blood pressure (!) 118/92, pulse (!) 112, temperature 98.7 F (37.1 C), temperature source Oral, resp. rate (!) 29, height 5' 6"  (1.676 m), weight 112 kg, SpO2 100 %. Physical Exam  Constitutional:  Patient is alert and her speech is clear this morning.  She insisted Thursday and she has been here since Monday.  Today's date is Monday, 02/12/2018.  She is aware of the year and she knows the president.  She initially refused to turn over and said they should turn her over when every one there because it hurts so much.  She was having a hard time understanding that they were changing her dressing because it had soaked through.  HENT:  Head: Normocephalic and atraumatic.  Eyes: Right eye exhibits no discharge. Left eye exhibits no discharge. No scleral icterus.  Pupils are equal  Neck: Normal range of motion. Neck supple. No JVD present. No tracheal deviation present. No thyromegaly present.  Cardiovascular: Normal heart sounds. Exam reveals no gallop.  No murmur heard. She is tachycardic but in a regular rhythm.  Both feet are in large protective boots, and pulses were not able to be palpated with removing the boots.  Respiratory: Effort normal and breath sounds normal. No respiratory  distress. She has no wheezes. She has no rales. She exhibits no tenderness.  GI: Soft. Bowel sounds are normal. She exhibits no distension and no mass. There is no tenderness. There is no rebound and no guarding.  Musculoskeletal: She exhibits edema and tenderness.  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  Neurological: She is alert.  She has a left hemiparesis.  She is somewhat confused about the day.  Any movement is really painful for her.  Tape used on the skin is causing further breakdown of  the skin when the tape was removed.  She reports an allergy to paper tape.  Skin:  "Left heel with dark reddish purple deep tissue injury; 10X10cm Left 2nd toe with full thickness wound to tip; .5X.5X.2cm, dark brown-red, no odor, drainage, or fluctuance.   Left posterior shoulder with unstageable pressure injury; 2X.3cm, 100% tightly adhered yellow slough, small amt yellow drainage, no odor or fluctuance Left outer thigh with unstageable pressure injury; 2X4cm, 100% tightly adhered eschar, no drainage, odor or fluctuance Sacrum with unstageable pressure injury; approx 20X20cm wound which tunnels to 4 cm when swab is inserted; large amt thick tan pus drainage. 40% red, 60% loose slough with fluctuance, some strong odor. There is approx 6 cm of dark reddish-purple deep tissue injuries surrounding the wound; expect this to evolve into full thickness tissue loss."  Above text is from Wound therapy nurse note, I did not measure the sites, it was all we could do to get her turned over and obtain the view in the picture.  As you can see from the picture she has Multiple areas of full skin loss on her buttocks.  There is at least a 10 x 8 cm area at the top of the gluteal fold that is necrotic, draining blood brown fluid and malodorous.  Picture also shows a large area of skin color change over the buttocks at the upper end of the gluteal fold. She has a wick in place but she also has perineal skin breakdown around her urethra.  Psychiatric:  She still seems to be somewhat confused.  She initially refused to turn over, she did this during her last admission also.  We do not know what her baseline is.  She is extremely uncomfortable, it hurts to move, it hurts to have tape removed.  Very difficult to assess her with all these concomitant issues.    Assessment/Plan: Septic shock with large sacral decubitus -MRSA  Positive 12/2017  Delirium improving Probable UTI Multiple skin ulcers Left hemiplegia, with  history of cervical decompression/discectomy 2018 Acute kidney injury -improved  Anemia - most likely from chronic illness/rehydration Hx of Sarcoidosis Hx of CVA  Nephrolithiasis - non obstructing Hypothyroid Hypokalemia - potassium is being replaced.    Plan: I will review with Dr. Harlow Asa, this area needs to be debrided.  The larger issue would be how much needs debridement, she most likely needs either a Foley or a suprapubic catheter.  We would also need to discuss possible diverting colostomy since this is such a large area.  We will have to discuss this with the family I do not think the patient can understand and assist in making this kind of decision.  The likelihood of his healing is very low.  MRI is pending.  Would also recommend ID consult,and a Plastics consult to heal.  I will make her n.p.o. after midnight, we will check pre-albumin.  We will discuss with the  family after Dr. Armandina Gemma has seen her.  If she is medically stable and everyone wishes to go forward, aim for surgery tomorrow.  Her husband left the hospital around 4 AM this morning.  Emergency contacts: Benner,Mykia Daughter [phone]    Shakiera, Edelson Spouse [phone]       Ranessa Kosta 02/11/2018, 11:18 AM

## 2018-02-11 NOTE — Consult Note (Addendum)
[facility] Nurse wound consult note Reason for Consult: Consult requested for several wounds. Left heel with dark reddish purple deep tissue injury; 10X10cm Left 2nd toe with full thickness wound to tip; .5X.5X.2cm, dark brown-red, no odor, drainage, or fluctuance.   Left posterior shoulder with unstageable pressure injury; 2X.3cm, 100% tightly adhered yellow slough, small amt yellow drainage, no odor or fluctuance Left outer thigh with unstageable pressure injury; 2X4cm, 100% tightly adhered eschar, no drainage, odor or fluctuance Sacrum with unstageable pressure injury; approx 20X20cm wound which tunnels to 4 cm when swab is inserted; large amt thick tan pus drainage. 40% red, 60% loose slough with fluctuance, some strong odor. There is approx 6 cm of dark reddish-purple deep tissue injuries surrounding the wound; expect this to evolve into full thickness tissue loss. Pressure Injury POA: Yes Pt is very upset and crying and states "no one would listen to me" when asked how long her wound has declined.  She is difficult to understand and determine who has been caring for the wound. She is currently on an air mattress to reduce pressure and has bilat Prevalon boots to reduce pressure to heels.  Dressing procedure/placement/frequency: Foam dressing to left heel to protect from further injury.  Bactroban to promote moist healing to left toe full thickness wound.  Santyl to provide enzymatic debridement of nonviable tissue to left posterior shoulder and left thigh. Recommend surgical consult for debridement of nonviable tissue to sacrum; suspect this is the source of sepsis.  Sent text page to primary team to discuss plan of care; no family members present. Please re-consult if further assistance is needed.  Thank-you,  Julien Girt MSN, [place], [place], [place], [place]

## 2018-02-12 ENCOUNTER — Inpatient Hospital Stay (HOSPITAL_COMMUNITY): Payer: Medicare Other

## 2018-02-12 LAB — COMPREHENSIVE METABOLIC PANEL
ALT: 27 U/L (ref 0–44)
AST: 27 U/L (ref 15–41)
Albumin: 1.9 g/dL — ABNORMAL LOW (ref 3.5–5.0)
Alkaline Phosphatase: 107 U/L (ref 38–126)
Anion gap: 10 (ref 5–15)
BUN: 10 mg/dL (ref 8–23)
CHLORIDE: 104 mmol/L (ref 98–111)
CO2: 25 mmol/L (ref 22–32)
Calcium: 8.2 mg/dL — ABNORMAL LOW (ref 8.9–10.3)
Creatinine, Ser: 0.69 mg/dL (ref 0.44–1.00)
GFR calc non Af Amer: >60 mL/min (ref >60)
Glucose, Bld: 92 mg/dL (ref 70–99)
Potassium: 4.2 mmol/L (ref 3.5–5.1)
SODIUM: 139 mmol/L (ref 135–145)
Total Bilirubin: 0.5 mg/dL (ref 0.3–1.2)
Total Protein: 5.8 g/dL — ABNORMAL LOW (ref 6.5–8.1)

## 2018-02-12 LAB — CBC
HCT: 25.9 % — ABNORMAL LOW (ref 36.0–46.0)
Hemoglobin: 8.5 g/dL — ABNORMAL LOW (ref 12.0–15.0)
MCH: 28.1 pg (ref 26.0–34.0)
MCHC: 32.8 g/dL (ref 30.0–36.0)
MCV: 85.5 fL (ref 78.0–100.0)
PLATELETS: 659 10*3/uL — AB (ref 150–400)
RBC: 3.03 MIL/uL — ABNORMAL LOW (ref 3.87–5.11)
RDW: 16.1 % — ABNORMAL HIGH (ref 11.5–15.5)
WBC: 11.5 10*3/uL — ABNORMAL HIGH (ref 4.0–10.5)

## 2018-02-12 LAB — GLUCOSE, CAPILLARY
GLUCOSE-CAPILLARY: 82 mg/dL (ref 70–99)
GLUCOSE-CAPILLARY: 96 mg/dL (ref 70–99)
Glucose-Capillary: 140 mg/dL — ABNORMAL HIGH (ref 70–99)
Glucose-Capillary: 119 mg/dL — ABNORMAL HIGH (ref 70–99)

## 2018-02-12 LAB — CULTURE, BLOOD (ROUTINE X 2): SPECIAL REQUESTS: ADEQUATE

## 2018-02-12 LAB — MAGNESIUM: Magnesium: 1.5 mg/dL — ABNORMAL LOW (ref 1.7–2.4)

## 2018-02-12 MED ORDER — CARVEDILOL 25 MG PO TABS
25.0000 mg | ORAL_TABLET | Freq: Two times a day (BID) | ORAL | Status: DC
  Administered 2018-02-12 – 2018-02-26 (×29): 25 mg via ORAL
  Filled 2018-02-12 (×16): qty 1
  Filled 2018-02-12: qty 2
  Filled 2018-02-12 (×12): qty 1

## 2018-02-12 MED ORDER — KETOROLAC TROMETHAMINE 15 MG/ML IJ SOLN
15.0000 mg | Freq: Once | INTRAMUSCULAR | Status: AC
  Administered 2018-02-12: 15 mg via INTRAVENOUS
  Filled 2018-02-12: qty 1

## 2018-02-12 NOTE — Care Management Note (Addendum)
Case Management Note  Patient Details  Name: Verbie Babic MRN: [id number] Date of Birth: October 30, 1955  Subjective/Objective:      Positive bld.cultures-GPC in clusters, wcb-11.5, IV lr at 100cc/hrs/ Iv diflucan, iv zosyn, iv vancomycin/ mental status improved              Action/Plan: being moved out of sdu today to telemetry bed.1415.  Expected Discharge Date:  (unknown)               Expected Discharge Plan:  Home/Self Care  In-House Referral:     Discharge planning Services  CM Consult  Post Acute Care Choice:    Choice offered to:     DME Arranged:    DME Agency:     HH Arranged:    HH Agency:     Status of Service:  In process, will continue to follow  If discussed at Long Length of Stay Meetings, dates discussed:    Additional Comments:  Jaleia, Hanke, RN 02/12/2018, 9:39 AM

## 2018-02-12 NOTE — Progress Notes (Signed)
Tried to call report to 4E. RN unavailable at this time. Will call back.

## 2018-02-12 NOTE — Progress Notes (Addendum)
CC:  AMS  Subjective: Husband in room and thinks tape caused the issue.  Refused NPO and is waiting to talk with Dr. Harlow Asa.  We will turn her over and discuss later today with patient and Mr. Wollin.   Wound looks much the same as before, ongoing malodorous drainage from upper portion of the wound.  More skin demarcation noted today.  She is very uncomfortable all the time, the perineum is irritated but not involved.      Objective: Vital signs in last 24 hours: Temp:  [98.3 F (36.8 C)-98.9 F (37.2 C)] 98.5 F (36.9 C) (08/13 0400) Pulse Rate:  [108-117] 110 (08/13 0700) Resp:  [5-29] 19 (08/13 0700) BP: (115-159)/(55-114) 159/83 (08/13 0600) SpO2:  [90 %-100 %] 100 % (08/13 0700) Last BM Date: (UTA) 720 PO 900 IV Urine 3500 Afebrile, tachycardic, BP improving Labs pending  MRI results pending  Intake/Output from previous day: 08/12 0701 - 08/13 0700 In: 1685.9 [P.O.:720; I.V.:563.5; IV Piggyback:402.4] Out: 3500 [Urine:3500] Intake/Output this shift: No intake/output data recorded.  General appearance: alert, cooperative, mild distress and it hurts to move.  Skin: the appearance in the picture from yesterday is about the same as it appears today.  The skin that was demarcating yesterday is darker today.  Ongoing drainage.  Examined today by Dr. Harlow Asa  Lab Results:  Recent Labs    02/10/18 0236 02/11/18 0607  WBC 23.8* 12.2*  HGB 10.3* 7.3*  HCT 31.3* 22.4*  PLT 923* 598*    BMET Recent Labs    02/10/18 0236 02/11/18 0607  NA 131* 135  K 4.2 2.9*  CL 90* 106  CO2 24 22  GLUCOSE 191* 51*  BUN 41* 20  CREATININE 4.21* 0.96  CALCIUM 8.1* 7.0*   PT/INR No results for input(s): LABPROT, INR in the last 72 hours.  Recent Labs  Lab 02/10/18 0236  AST 38  ALT 27  ALKPHOS 129*  BILITOT 0.4  PROT 6.7  ALBUMIN 1.8*     Lipase  No results found for: LIPASE   Medications: . amitriptyline  100 mg Oral QHS  . carvedilol  25 mg Oral BID WC  .  collagenase   Topical Daily  . enoxaparin (LOVENOX) injection  40 mg Subcutaneous Q24H  . feeding supplement (ENSURE ENLIVE)  237 mL Oral BID BM  . insulin regular  0-5 Units Subcutaneous QHS  . insulin regular  0-9 Units Subcutaneous TID WC  . levothyroxine  150 mcg Oral QAC breakfast  . morphine  30 mg Oral Q12H  . multivitamin with minerals  1 tablet Oral Daily  . mupirocin cream   Topical Daily  . oxybutynin  15 mg Oral Daily  . pantoprazole  40 mg Oral Daily  . polyethylene glycol  17 g Oral BID  . pravastatin  20 mg Oral Daily  . vitamin B-6  250 mg Oral Daily  . vitamin B-12  1,000 mcg Oral Daily  . vitamin E  400 Units Oral Daily   . sodium chloride    . fluconazole (DIFLUCAN) IV    . lactated ringers Stopped (02/11/18 2052)  . piperacillin-tazobactam (ZOSYN)  IV 12.5 mL/hr at 02/12/18 0600  . vancomycin Stopped (02/11/18 2018)   Anti-infectives (From admission, onward)   Start     Dose/Rate Route Frequency Ordered Stop   02/12/18 1000  fluconazole (DIFLUCAN) IVPB 100 mg     100 mg 50 mL/hr over 60 Minutes Intravenous Every 24 hours 02/11/18 0902  02/11/18 1600  vancomycin (VANCOCIN) 1,250 mg in sodium chloride 0.9 % 250 mL IVPB     1,250 mg 166.7 mL/hr over 90 Minutes Intravenous Every 24 hours 02/11/18 0801     02/11/18 1200  piperacillin-tazobactam (ZOSYN) IVPB 3.375 g     3.375 g 12.5 mL/hr over 240 Minutes Intravenous Every 8 hours 02/11/18 0800     02/11/18 1000  fluconazole (DIFLUCAN) IVPB 200 mg     200 mg 100 mL/hr over 60 Minutes Intravenous  Once 02/11/18 0902 02/11/18 1148   02/10/18 1000  piperacillin-tazobactam (ZOSYN) IVPB 2.25 g  Status:  Discontinued     2.25 g 100 mL/hr over 30 Minutes Intravenous Every 6 hours 02/10/18 0433 02/11/18 0800   02/10/18 0432  vancomycin variable dose per unstable renal function (pharmacist dosing)  Status:  Discontinued      Does not apply See admin instructions 02/10/18 0433 02/11/18 0802   02/10/18 0245   piperacillin-tazobactam (ZOSYN) IVPB 3.375 g     3.375 g 100 mL/hr over 30 Minutes Intravenous  Once 02/10/18 0231 02/10/18 0337   02/10/18 0245  vancomycin (VANCOCIN) IVPB 1000 mg/200 mL premix  Status:  Discontinued     1,000 mg 200 mL/hr over 60 Minutes Intravenous  Once 02/10/18 0231 02/10/18 0242   02/10/18 0245  vancomycin (VANCOCIN) 2,000 mg in sodium chloride 0.9 % 500 mL IVPB     2,000 mg 250 mL/hr over 120 Minutes Intravenous  Once 02/10/18 0242 02/10/18 0645      Assessment/Plan Acute kidney injury -improved  Anemia - most likely from chronic illness/rehydration Hx of Sarcoidosis Hx of CVA  Nephrolithiasis - non obstructing Hypothyroid Hypokalemia - potassium is being replaced.  Septic shock with large sacral decubitus -MRSA  Positive 12/2017  Staphylococcus species - positive blood culture Yeast UTI Multiple skin ulcers Left hemiplegia, with history of cervical decompression/discectomy 2018 Bed-chairbound  FEN: IV fluids/NPO ID: Vancomycin/Zosyn 8/11 =>> day 3,  Diflucan 8/12 =>> day 2 DVT:  Lovenox Follow up:  TBD  Plan: We will offer her surgical dbridement tomorrow.  Dr. Harlow Asa will talk with her husband between cases.    Emergency contacts: Klarisa, [place] Daughter [phone]    Felishia, [place] Spouse [phone]        LOS: 2 days    Earnstine Regal 02/12/2018 [phone]

## 2018-02-12 NOTE — Progress Notes (Signed)
Brought patient back down for MRI for 2nd attempt. Patient refused exam stating its too small and I cannot breathe in there. I asked her if she would try it with Anti anxiety medication and she aid she was not going to do the MRI.Clinical education was provided to the patient for significance of exam. Spoke to Janett Billow RN in ICU as well as [facility] representative 1 for room 1415.  BHJ

## 2018-02-12 NOTE — Progress Notes (Addendum)
PROGRESS NOTE    Natalie Macdonald  QJJ:[id number] DOB: 1956-02-24 DOA: 02/10/2018 PCP: Clifton Custard, MD   Brief Narrative: Natalie Macdonald is a 62 y.o. 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 She presented with altered mental status found to have septic shock.   Assessment & Plan:   Principal Problem:   Septic shock ([place]) Active Problems:   Diabetes mellitus type 2 with complications ([place])   Left hemiplegia ([place])   Sacral decubitus ulcer, stage III (HCC)   AKI (acute kidney injury) ([place])   Toxic encephalopathy   Septic shock Patient received 3.5 L in the ED with improvement in blood pressure. Blood pressure down again now. Associated kidney injury. Source is wound vs urinary. Leukocytosis. Patient received 8 L of IV fluids. Repeat lactic acid is normal. Urine culture significant for yeast and blood cultures significant for 1/4 GPC in clusters and 1/4 GNR -Blood cultures pending -Continue Vancomycin and Zosyn -Fluconazole added for UTI  Delirium In setting of septic shock. Resolved.  Pressure ulcers Multiple; unstageable sacral, unstageable left posterior shoulder, unstageable left outer thigh, left 2nd toe full thickness wound, left heel deep tissue injury. -MRI sacrum (reorder today as was unable to obtain yesterday secondary to pain) -General surgery recommendations: debridement (patient/husband refused plan for tentative surgery today until everything discussed with attending surgeon).  Acute kidney injury In setting of septic shock. Patient with good urine output. Resolved.  Diabetes mellitus, type 2 Uncontrolled with hyperglycemia at home and hypoglycemia during admission. Hypoglycemia improved. -Continue Lantus 8 units qhs -Continue SSI (qAC/HS)  Hypokalemia -Potassium supplementation -Repeat BMP pending  Anemia Chronic and worsened  in the setting of significant fluid resuscitation. No active bleeding. Stable.  Left hemiplegia Chronic. Baseline.  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   DVT prophylaxis: Lovenox Code Status:   Code Status: Full Code Family Communication: Husband at bedside Disposition Plan: Transfer to telemetry. Discharge when medically stable   Consultants:   General surgery  Procedures:   None  Antimicrobials:  Vancomycin  Zosyn    Subjective: No issues today. "had a night"  Objective: Vitals:   02/12/18 0400 02/12/18 0500 02/12/18 0600 02/12/18 0700  BP: 132/72 134/79 (!) 159/83   Pulse: (!) 108 (!) 109 (!) 110 (!) 110  Resp: (!) 23 (!) 21 16 19   Temp: 98.5 F (36.9 C)     TempSrc: Oral     SpO2: 100% 100% 100% 100%  Weight:      Height:        Intake/Output Summary (Last 24 hours) at 02/12/2018 0812 Last data filed at 02/12/2018 0600 Gross per 24 hour  Intake 1445.9 ml  Output 3500 ml  Net -2054.1 ml   Filed Weights   02/10/18 0427  Weight: 112 kg    Examination:  General exam: Appears calm and comfortable Respiratory system: Clear to auscultation. Respiratory effort normal. Cardiovascular system: S1 & S2 heard, fast rate with regular rhythm. No murmurs, rubs, gallops or clicks. Gastrointestinal system: Abdomen is nondistended, soft and nontender. No organomegaly or masses felt. Normal bowel sounds heard. Central nervous system: Alert and oriented. Extremities: 2+ LE edema. No calf tenderness Skin:  No cyanosis. No rashes Psychiatry: Judgement and insight appear normal. Mood & affect appropriate.    Data Reviewed: I have personally reviewed following labs  and imaging studies  CBC: Recent Labs  Lab 02/10/18 0236 02/11/18 0607 02/12/18 0802  WBC 23.8* 12.2* 11.5*  NEUTROABS 18.5  --   --   HGB 10.3* 7.3* 8.5*  HCT 31.3* 22.4* 25.9*  MCV 86.0 86.8 85.5  PLT 923* 598* 401*   Basic Metabolic Panel: Recent Labs  Lab 02/10/18 0236 02/11/18 0607  NA 131* 135  K 4.2 2.9*  CL 90* 106  CO2 24 22  GLUCOSE 191* 51*  BUN 41* 20  CREATININE 4.21* 0.96  CALCIUM 8.1* 7.0*   GFR: Estimated Creatinine Clearance: 76.1 mL/min (by C-G formula based on SCr of 0.96 mg/dL). Liver Function Tests: Recent Labs  Lab 02/10/18 0236  AST 38  ALT 27  ALKPHOS 129*  BILITOT 0.4  PROT 6.7  ALBUMIN 1.8*   No results for input(s): LIPASE, AMYLASE in the last 168 hours. No results for input(s): AMMONIA in the last 168 hours. Coagulation Profile: No results for input(s): INR, PROTIME in the last 168 hours. Cardiac Enzymes: Recent Labs  Lab 02/10/18 0236  TROPONINI <0.03   BNP (last 3 results) No results for input(s): PROBNP in the last 8760 hours. HbA1C: No results for input(s): HGBA1C in the last 72 hours. CBG: Recent Labs  Lab 02/11/18 0927 02/11/18 1143 02/11/18 1652 02/11/18 2240 02/12/18 0744  GLUCAP 72 112* 94 123* 82   Lipid Profile: No results for input(s): CHOL, HDL, LDLCALC, TRIG, CHOLHDL, LDLDIRECT in the last 72 hours. Thyroid Function Tests: No results for input(s): TSH, T4TOTAL, FREET4, T3FREE, THYROIDAB in the last 72 hours. Anemia Panel: No results for input(s): VITAMINB12, FOLATE, FERRITIN, TIBC, IRON, RETICCTPCT in the last 72 hours. Sepsis Labs: Recent Labs  Lab 02/10/18 0300 02/10/18 0517 02/11/18 0607  LATICACIDVEN 3.56* 2.20* 1.2    Recent Results (from the past 240 hour(s))  Blood Culture (routine x 2)     Status: None (Preliminary result)   Collection Time: 02/10/18  2:36 AM  Result Value Ref Range Status   Specimen Description   Final    BLOOD LEFT ANTECUBITAL Performed at [facility] [address], [place], [place] [address]    Special Requests   Final    BOTTLES DRAWN AEROBIC AND ANAEROBIC Blood Culture adequate volume Performed at [facility] [address] [place], [place] [address]    Culture  Setup Time   Final    GRAM NEGATIVE RODS ANAEROBIC BOTTLE ONLY Organism ID to follow CRITICAL RESULT CALLED TO, READ BACK BY AND VERIFIED WITH: Colin Rhein [facility] 02/11/18 1708 JDW Performed at [facility], [address], [place],  [address]    Culture GRAM NEGATIVE RODS  Final   Report Status PENDING  Incomplete  Blood Culture ID Panel (Reflexed)     Status: None   Collection Time: 02/10/18  2:36 AM  Result Value Ref Range Status   Enterococcus species NOT DETECTED NOT DETECTED Final    Comment: CRITICAL RESULT CALLED TO, READ BACK BY AND VERIFIED WITH: M BELL PHARMD 02/11/18 1708 JDW    Listeria monocytogenes NOT DETECTED NOT DETECTED Final   Staphylococcus species NOT DETECTED NOT DETECTED Final   Staphylococcus aureus NOT DETECTED NOT DETECTED Final   Streptococcus species NOT DETECTED NOT DETECTED Final   Streptococcus agalactiae NOT DETECTED NOT DETECTED Final   Streptococcus  pneumoniae NOT DETECTED NOT DETECTED Final   Streptococcus pyogenes NOT DETECTED NOT DETECTED Final   Acinetobacter baumannii NOT DETECTED NOT DETECTED Final   Enterobacteriaceae species NOT DETECTED NOT DETECTED Final   Enterobacter cloacae complex NOT DETECTED NOT DETECTED Final   Escherichia coli NOT DETECTED NOT DETECTED Final   Klebsiella oxytoca NOT DETECTED NOT DETECTED Final   Klebsiella pneumoniae NOT DETECTED NOT DETECTED Final   Proteus species NOT DETECTED NOT DETECTED Final   Serratia marcescens NOT DETECTED NOT DETECTED Final   Haemophilus influenzae NOT DETECTED NOT DETECTED Final   Neisseria meningitidis NOT DETECTED NOT DETECTED Final   Pseudomonas aeruginosa NOT DETECTED NOT DETECTED Final   Candida albicans NOT DETECTED NOT  DETECTED Final   Candida glabrata NOT DETECTED NOT DETECTED Final   Candida krusei NOT DETECTED NOT DETECTED Final   Candida parapsilosis NOT DETECTED NOT DETECTED Final   Candida tropicalis NOT DETECTED NOT DETECTED Final    Comment: Performed at [facility], [place] [address] [place], [place] [address]  Urine culture     Status: Abnormal   Collection Time: 02/10/18  2:42 AM  Result Value Ref Range Status   Specimen Description   Final    URINE, RANDOM Performed at [facility] [address], [place], [place] [address]    Special Requests   Final    NONE Performed at [facility], [place] [address] [place], [place] [address]    Culture >=100,000 COLONIES/mL YEAST (A)  Final   Report Status 02/11/2018 FINAL  Final  Blood Culture (routine x 2)     Status: None (Preliminary result)   Collection Time: 02/10/18  2:46 AM  Result Value Ref Range Status   Specimen Description   Final    BLOOD RIGHT ANTECUBITAL Performed at [facility] [address], [place], [place] [address]    Special Requests   Final    BOTTLES DRAWN AEROBIC AND ANAEROBIC Blood Culture adequate volume Performed at [facility] [address] [place], [place] [address]    Culture  Setup Time   Final    GRAM POSITIVE COCCI IN CLUSTERS AEROBIC BOTTLE ONLY Organism ID to follow CRITICAL RESULT CALLED TO, READ BACK BY AND VERIFIED WITH: B.GREEN,PHARMD AT 0620 ON 02/11/18 BY [email or web address]    Culture   Final    NO GROWTH 1 DAY Performed at [facility], [place] [address] [place],  [address]    Report Status PENDING  Incomplete  Blood Culture ID Panel (Reflexed)     Status: Abnormal   Collection Time: 02/10/18  2:46 AM  Result Value Ref Range Status   Enterococcus species NOT DETECTED NOT DETECTED Final   Listeria monocytogenes NOT DETECTED NOT DETECTED Final   Staphylococcus species DETECTED (A) NOT DETECTED Final     Comment: Methicillin (oxacillin) susceptible coagulase negative staphylococcus. Possible blood culture contaminant (unless isolated from more than one blood culture draw or clinical case suggests pathogenicity). No antibiotic treatment is indicated for blood  culture contaminants. CRITICAL RESULT CALLED TO, READ BACK BY AND VERIFIED WITH: B.GREEN,PHARMD AT 0620 ON 02/11/18 BY [email or web address]    Staphylococcus aureus NOT DETECTED NOT DETECTED Final   Methicillin resistance NOT DETECTED NOT DETECTED Final   Streptococcus species NOT DETECTED NOT DETECTED Final   Streptococcus agalactiae NOT DETECTED NOT DETECTED Final   Streptococcus pneumoniae NOT DETECTED NOT DETECTED Final   Streptococcus pyogenes NOT DETECTED NOT DETECTED Final   Acinetobacter  baumannii NOT DETECTED NOT DETECTED Final   Enterobacteriaceae species NOT DETECTED NOT DETECTED Final   Enterobacter cloacae complex NOT DETECTED NOT DETECTED Final   Escherichia coli NOT DETECTED NOT DETECTED Final   Klebsiella oxytoca NOT DETECTED NOT DETECTED Final   Klebsiella pneumoniae NOT DETECTED NOT DETECTED Final   Proteus species NOT DETECTED NOT DETECTED Final   Serratia marcescens NOT DETECTED NOT DETECTED Final   Haemophilus influenzae NOT DETECTED NOT DETECTED Final   Neisseria meningitidis NOT DETECTED NOT DETECTED Final   Pseudomonas aeruginosa NOT DETECTED NOT DETECTED Final   Candida albicans NOT DETECTED NOT DETECTED Final   Candida glabrata NOT DETECTED NOT DETECTED Final   Candida krusei NOT DETECTED NOT DETECTED Final   Candida parapsilosis NOT DETECTED NOT DETECTED Final   Candida tropicalis NOT DETECTED NOT DETECTED Final    Comment: Performed at [facility], [place] [address], [place], [place] [address]  MRSA PCR Screening     Status: Abnormal   Collection Time: 02/11/18 10:42 AM  Result Value Ref Range Status   MRSA by PCR POSITIVE (A) NEGATIVE Final    Comment:        The GeneXpert MRSA Assay (FDA approved for NASAL  specimens only), is one component of a comprehensive MRSA colonization surveillance program. It is not intended to diagnose MRSA infection nor to guide or monitor treatment for MRSA infections. RESULT CALLED TO, READ BACK BY AND VERIFIED WITHRuben Reason [id number] @ [address] [place] Performed at [facility] [address], [place], [place] [address]          Radiology Studies: No results found.      Scheduled Meds: . amitriptyline  100 mg Oral QHS  . carvedilol  25 mg Oral BID WC  . collagenase   Topical Daily  . enoxaparin (LOVENOX) injection  40 mg Subcutaneous Q24H  . feeding supplement (ENSURE ENLIVE)  237 mL Oral BID BM  . insulin regular  0-5 Units Subcutaneous QHS  . insulin regular  0-9 Units Subcutaneous TID WC  . levothyroxine  150 mcg Oral QAC breakfast  . morphine  30 mg Oral Q12H  . multivitamin with minerals  1 tablet Oral Daily  . mupirocin cream   Topical Daily  . oxybutynin  15 mg Oral Daily  . pantoprazole  40 mg Oral Daily  . polyethylene glycol  17 g Oral BID  . pravastatin  20 mg Oral Daily  . vitamin B-6  250 mg Oral Daily  . vitamin B-12  1,000 mcg Oral Daily  . vitamin E  400 Units Oral Daily   Continuous Infusions: . sodium chloride    . fluconazole (DIFLUCAN) IV    . lactated ringers Stopped (02/11/18 2052)  . piperacillin-tazobactam (ZOSYN)  IV 12.5 mL/hr at 02/12/18 0600  . vancomycin Stopped (02/11/18 2018)     LOS: 2 days     Cordelia Poche, MD [facility] 02/12/2018, 8:12 AM Pager: [phone]  If 7PM-7AM, please contact night-coverage [email or web address] 02/12/2018, 8:12 AM

## 2018-02-13 ENCOUNTER — Encounter (HOSPITAL_BASED_OUTPATIENT_CLINIC_OR_DEPARTMENT_OTHER): Payer: Medicare Other | Attending: Internal Medicine

## 2018-02-13 DIAGNOSIS — Z794 Long term (current) use of insulin: Secondary | ICD-10-CM

## 2018-02-13 DIAGNOSIS — E860 Dehydration: Secondary | ICD-10-CM

## 2018-02-13 DIAGNOSIS — E118 Type 2 diabetes mellitus with unspecified complications: Secondary | ICD-10-CM

## 2018-02-13 LAB — GLUCOSE, CAPILLARY
GLUCOSE-CAPILLARY: 159 mg/dL — AB (ref 70–99)
GLUCOSE-CAPILLARY: 150 mg/dL — AB (ref 70–99)
Glucose-Capillary: 123 mg/dL — ABNORMAL HIGH (ref 70–99)
Glucose-Capillary: 182 mg/dL — ABNORMAL HIGH (ref 70–99)

## 2018-02-13 LAB — CREATININE, SERUM
Creatinine, Ser: 0.74 mg/dL (ref 0.44–1.00)
GFR calc non Af Amer: >60 mL/min (ref >60)

## 2018-02-13 LAB — HEMOGLOBIN A1C
Hgb A1c MFr Bld: 8.3 % — ABNORMAL HIGH (ref 4.8–5.6)
Mean Plasma Glucose: 192 mg/dL

## 2018-02-13 MED ORDER — SUCRALFATE 1 GM/10ML PO SUSP
1.0000 g | Freq: Three times a day (TID) | ORAL | Status: DC
  Administered 2018-02-13 – 2018-02-24 (×15): 1 g via ORAL
  Filled 2018-02-13 (×26): qty 10

## 2018-02-13 MED ORDER — PANTOPRAZOLE SODIUM 40 MG PO TBEC
40.0000 mg | DELAYED_RELEASE_TABLET | Freq: Two times a day (BID) | ORAL | Status: DC
  Administered 2018-02-13 – 2018-02-26 (×27): 40 mg via ORAL
  Filled 2018-02-13 (×27): qty 1

## 2018-02-13 MED ORDER — DIPHENHYDRAMINE HCL 25 MG PO CAPS
25.0000 mg | ORAL_CAPSULE | ORAL | Status: DC | PRN
  Administered 2018-02-14 – 2018-02-15 (×2): 25 mg via ORAL
  Filled 2018-02-13 (×2): qty 1

## 2018-02-13 MED ORDER — SODIUM CHLORIDE 0.9 % IV SOLN
INTRAVENOUS | Status: DC | PRN
  Administered 2018-02-13 – 2018-02-22 (×4): 250 mL via INTRAVENOUS

## 2018-02-13 MED ORDER — DIPHENHYDRAMINE HCL 50 MG PO CAPS
50.0000 mg | ORAL_CAPSULE | Freq: Once | ORAL | Status: AC
  Administered 2018-02-13: 50 mg via ORAL
  Filled 2018-02-13: qty 1

## 2018-02-13 NOTE — Care Management Important Message (Deleted)
Important Message  Patient Details  Name: Natalie Macdonald MRN: [id number] Date of Birth: 05/15/1956   Medicare Important Message Given:       Kerin Salen 02/13/2018, 11:57 AMImportant Message  Patient Details  Name: Natalie Macdonald MRN: [id number] Date of Birth: 11-20-1955   Medicare Important Message Given:       Kerin Salen 02/13/2018, 11:57 AM

## 2018-02-13 NOTE — Progress Notes (Signed)
PROGRESS NOTE    Natalie Macdonald  MVH:[id number] DOB: 1955-11-08 DOA: 02/10/2018 PCP: Clifton Custard, MD    Brief Narrative:  62 year old female who presented with altered mental status.  She does have significant past medical history for type 2 diabetes mellitus, dyslipidemia, hypertension, hypothyroidism sarcoidosis and chronic left hemiparesis (wheelchair-bound).  Patient was recently discharged from the hospital (01/10/2018) after being treated for septic shock related to sacral decubitus ulcer stage III infection with MRSA.  Reported 24 hours of decreased p.o. intake and altered mentation.  On her initial physical examination her blood pressure was 61/43, temperature 100.6, heart rate 122 respiratory rate 19, oxygen saturation 98%, pupils were point point bilaterally, she had a hoarse voice, lungs clear to auscultation, heart S1-S2 present and rhythmic, abdomen protuberant with no rebound or guarding, stage III large decubitus ulcer, positive drainage, smaller lesion in the thoracic area also stage III.  131, potassium 4.2, chloride 90, bicarb 24, glucose 191, BUN 41, creatinine 4.2, white count 23.8, hemoglobin 10.3, hematocrit 31.3, platelets 923 urinalysis specific gravity 1.019, white cells and 21-50.  CT of the abdomen with no acute abnormalities.  Chest x-ray hypoinflated.  EKG sinus tachycardia 122 beats per minutes, normal axis, T wave inversions in V4 V5 and V6, left bundle branch block.   Patient was admitted to the hospital working diagnosis of sepsis due to decubitus ulcer infection.   Assessment & Plan:   Principal Problem:   Septic shock ([place]) Active Problems:   Diabetes mellitus type 2 with complications ([place])   Left hemiplegia ([place])   Sacral decubitus ulcer, stage III ([place])   AKI (acute kidney injury) ([place])   Toxic encephalopathy  1. Sepsis due to decubitus ulcer MRSA infection, present on admission. Will continue antibiotic therapy with IV vancomycin, and local wound  care. Patient had declined further workup with MRI. Indicated debridement but patient and her husband still no have decided. Microbiology with urine culture positive for yeast, blood cultures positive for gram negative rods and gram positive cocci in clusters. Continue with Zosyn and fluconazole. Wbc down to 11,5, patient has remained afebrile.   2. Urine infection due yeast. Present on admission, will continue fluconazole.   3. HTN. Continue carvedilol.    4. Dyspepsia. Will add antiacid therapy with pantoprazole bid and sucralfate with meals.   5. Chronic back pain. Will continue pain regimen with 30 mg morphine bid, and as needed oxycodone.   6. Hypothyroid. Continue levothyroxine.  7. T2DM. Will continue glucose cover and monitoring with insulin sliding scale. Capillary glucose 119, 123, 182.   8. Obesity. Continue nutritional supplements, with ensure.   DVT prophylaxis enoxaparin   Code Status: full Family Communication: no family at the bedside  Disposition Plan/ discharge barriers: pending surgical debridement    Consultants:   General surgery   Procedures:     Antimicrobials:   Vancomycin IV   Zosyn IV  Fluconazole IV.     Subjective: Patient continue to have back pain, at the site of her wounds, moderate to severe. Now with epigastric abdominal pain, colic in nature, moderate in intensity with no improving or worsening factors, no radiation.   Objective: Vitals:   02/12/18 1157 02/12/18 1258 02/12/18 2137 02/13/18 0529  BP: 115/78 (!) 135/102 123/70 128/71  Pulse: 90 90 85 93  Resp: 20 20 18 18   Temp: 99.7 F (37.6 C) 98.3 F (36.8 C) 99.4 F (37.4 C) 99.6 F (37.6 C)  TempSrc: Oral Oral Oral Oral  SpO2: 100% 98% 97%  97%  Weight:      Height:        Intake/Output Summary (Last 24 hours) at 02/13/2018 1252 Last data filed at 02/13/2018 0600 Gross per 24 hour  Intake 1831.16 ml  Output 250 ml  Net 1581.16 ml   Filed Weights   02/10/18 0427    Weight: 112 kg    Examination:   General: Not in pain or dyspnea, deconditioned  Neurology: Awake and alert, non focal  E ENT: positive pallor, no icterus, oral mucosa moist Cardiovascular: wide neck, with no JVD. S1-S2 present, rhythmic, no gallops, rubs, or murmurs. ++ non pitting lower extremity edema. Pulmonary: decreased breath sounds bilaterally, decreased air movement, no wheezing, rhonchi or rales. Gastrointestinal. Abdomen protuberant with no organomegaly, non tender, no rebound or guarding Skin. No rashes Musculoskeletal: no joint deformities     Data Reviewed: I have personally reviewed following labs and imaging studies  CBC: Recent Labs  Lab 02/10/18 0236 02/11/18 0607 02/12/18 0802  WBC 23.8* 12.2* 11.5*  NEUTROABS 18.5  --   --   HGB 10.3* 7.3* 8.5*  HCT 31.3* 22.4* 25.9*  MCV 86.0 86.8 85.5  PLT 923* 598* 993*   Basic Metabolic Panel: Recent Labs  Lab 02/10/18 0236 02/11/18 0607 02/12/18 0802 02/13/18 0452  NA 131* 135 139  --   K 4.2 2.9* 4.2  --   CL 90* 106 104  --   CO2 24 22 25   --   GLUCOSE 191* 51* 92  --   BUN 41* 20 10  --   CREATININE 4.21* 0.96 0.69 0.74  CALCIUM 8.1* 7.0* 8.2*  --   MG  --   --  1.5*  --    GFR: Estimated Creatinine Clearance: 91.4 mL/min (by C-G formula based on SCr of 0.74 mg/dL). Liver Function Tests: Recent Labs  Lab 02/10/18 0236 02/12/18 0802  AST 38 27  ALT 27 27  ALKPHOS 129* 107  BILITOT 0.4 0.5  PROT 6.7 5.8*  ALBUMIN 1.8* 1.9*   No results for input(s): LIPASE, AMYLASE in the last 168 hours. No results for input(s): AMMONIA in the last 168 hours. Coagulation Profile: No results for input(s): INR, PROTIME in the last 168 hours. Cardiac Enzymes: Recent Labs  Lab 02/10/18 0236  TROPONINI <0.03   BNP (last 3 results) No results for input(s): PROBNP in the last 8760 hours. HbA1C: Recent Labs    02/12/18 0802  HGBA1C 8.3*   CBG: Recent Labs  Lab 02/12/18 1156 02/12/18 1700  02/12/18 2142 02/13/18 0721 02/13/18 1146  GLUCAP 96 140* 119* 123* 182*   Lipid Profile: No results for input(s): CHOL, HDL, LDLCALC, TRIG, CHOLHDL, LDLDIRECT in the last 72 hours. Thyroid Function Tests: No results for input(s): TSH, T4TOTAL, FREET4, T3FREE, THYROIDAB in the last 72 hours. Anemia Panel: No results for input(s): VITAMINB12, FOLATE, FERRITIN, TIBC, IRON, RETICCTPCT in the last 72 hours.    Radiology Studies: I have reviewed all of the imaging during this hospital visit personally     Scheduled Meds: . amitriptyline  100 mg Oral QHS  . carvedilol  25 mg Oral BID WC  . collagenase   Topical Daily  . enoxaparin (LOVENOX) injection  40 mg Subcutaneous Q24H  . feeding supplement (ENSURE ENLIVE)  237 mL Oral BID BM  . insulin regular  0-5 Units Subcutaneous QHS  . insulin regular  0-9 Units Subcutaneous TID WC  . levothyroxine  150 mcg Oral QAC breakfast  . morphine  30 mg  Oral Q12H  . multivitamin with minerals  1 tablet Oral Daily  . mupirocin cream   Topical Daily  . oxybutynin  15 mg Oral Daily  . pantoprazole  40 mg Oral BID  . polyethylene glycol  17 g Oral BID  . pravastatin  20 mg Oral Daily  . vitamin B-6  250 mg Oral Daily  . sucralfate  1 g Oral TID WC & HS  . vitamin B-12  1,000 mcg Oral Daily  . vitamin E  400 Units Oral Daily   Continuous Infusions: . sodium chloride    . sodium chloride 250 mL (02/13/18 0452)  . fluconazole (DIFLUCAN) IV 100 mg (02/13/18 1019)  . piperacillin-tazobactam (ZOSYN)  IV 3.375 g (02/13/18 1233)  . vancomycin 20 mL/hr at 02/12/18 1800     LOS: 3 days        Mauricio Gerome Apley, MD [facility] Pager [phone]

## 2018-02-13 NOTE — Care Management Important Message (Signed)
Important Message  Patient Details  Name: Natalie Macdonald MRN: [id number] Date of Birth: May 27, 1956   Medicare Important Message Given:       Kerin Salen 02/13/2018, 11:56 AM

## 2018-02-13 NOTE — Progress Notes (Signed)
ZO:[id number] Mental status  Subjective: No real changes, her husband could not wait for Dr. Harlow Asa, said he had to go to an auction.  Appears angry he did not come earlier or last night to talk, he acknowledges Dr. Harlow Asa tried to call at 5:30 PM and 6:30 PM last evening.  His wife is asking what they decided after he left.    Objective: Vital signs in last 24 hours: Temp:  [98.3 F (36.8 C)-99.7 F (37.6 C)] 99.6 F (37.6 C) (08/14 0529) Pulse Rate:  [85-111] 93 (08/14 0529) Resp:  [18-20] 18 (08/14 0529) BP: (115-135)/(70-102) 128/71 (08/14 0529) SpO2:  [97 %-100 %] 97 % (08/14 0529) Last BM Date: (UTA) 1540 PO 1100 IV Urine x 3 No BM recorded TM 99.6/VSS Creatinine 0.74 No CBC this AM Intake/Output from previous day: 08/13 0701 - 08/14 0700 In: 2688.8 [P.O.:1540; I.V.:700.1; IV Piggyback:448.7] Out: 550 [Urine:550] Intake/Output this shift: No intake/output data recorded.  General appearance: alert, cooperative, no distress and She does not seem to have any input to the decisions and is leaving to her husband.    Lab Results:  Recent Labs    02/11/18 0607 02/12/18 0802  WBC 12.2* 11.5*  HGB 7.3* 8.5*  HCT 22.4* 25.9*  PLT 598* 659*    BMET Recent Labs    02/11/18 0607 02/12/18 0802 02/13/18 0452  NA 135 139  --   K 2.9* 4.2  --   CL 106 104  --   CO2 22 25  --   GLUCOSE 51* 92  --   BUN 20 10  --   CREATININE 0.96 0.69 0.74  CALCIUM 7.0* 8.2*  --    PT/INR No results for input(s): LABPROT, INR in the last 72 hours.  Recent Labs  Lab 02/10/18 0236 02/12/18 0802  AST 38 27  ALT 27 27  ALKPHOS 129* 107  BILITOT 0.4 0.5  PROT 6.7 5.8*  ALBUMIN 1.8* 1.9*     Lipase  No results found for: LIPASE   Medications: . amitriptyline  100 mg Oral QHS  . carvedilol  25 mg Oral BID WC  . collagenase   Topical Daily  . enoxaparin (LOVENOX) injection  40 mg Subcutaneous Q24H  . feeding supplement (ENSURE ENLIVE)  237 mL Oral BID BM  . insulin  regular  0-5 Units Subcutaneous QHS  . insulin regular  0-9 Units Subcutaneous TID WC  . levothyroxine  150 mcg Oral QAC breakfast  . morphine  30 mg Oral Q12H  . multivitamin with minerals  1 tablet Oral Daily  . mupirocin cream   Topical Daily  . oxybutynin  15 mg Oral Daily  . pantoprazole  40 mg Oral Daily  . polyethylene glycol  17 g Oral BID  . pravastatin  20 mg Oral Daily  . vitamin B-6  250 mg Oral Daily  . vitamin B-12  1,000 mcg Oral Daily  . vitamin E  400 Units Oral Daily   Anti-infectives (From admission, onward)   Start     Dose/Rate Route Frequency Ordered Stop   02/12/18 1000  fluconazole (DIFLUCAN) IVPB 100 mg     100 mg 50 mL/hr over 60 Minutes Intravenous Every 24 hours 02/11/18 0902     02/11/18 1600  vancomycin (VANCOCIN) 1,250 mg in sodium chloride 0.9 % 250 mL IVPB     1,250 mg 166.7 mL/hr over 90 Minutes Intravenous Every 24 hours 02/11/18 0801     02/11/18 1200  piperacillin-tazobactam (ZOSYN)  IVPB 3.375 g     3.375 g 12.5 mL/hr over 240 Minutes Intravenous Every 8 hours 02/11/18 0800     02/11/18 1000  fluconazole (DIFLUCAN) IVPB 200 mg     200 mg 100 mL/hr over 60 Minutes Intravenous  Once 02/11/18 0902 02/11/18 1148   02/10/18 1000  piperacillin-tazobactam (ZOSYN) IVPB 2.25 g  Status:  Discontinued     2.25 g 100 mL/hr over 30 Minutes Intravenous Every 6 hours 02/10/18 0433 02/11/18 0800   02/10/18 0432  vancomycin variable dose per unstable renal function (pharmacist dosing)  Status:  Discontinued      Does not apply See admin instructions 02/10/18 0433 02/11/18 0802   02/10/18 0245  piperacillin-tazobactam (ZOSYN) IVPB 3.375 g     3.375 g 100 mL/hr over 30 Minutes Intravenous  Once 02/10/18 0231 02/10/18 0337   02/10/18 0245  vancomycin (VANCOCIN) IVPB 1000 mg/200 mL premix  Status:  Discontinued     1,000 mg 200 mL/hr over 60 Minutes Intravenous  Once 02/10/18 0231 02/10/18 0242   02/10/18 0245  vancomycin (VANCOCIN) 2,000 mg in sodium chloride  0.9 % 500 mL IVPB     2,000 mg 250 mL/hr over 120 Minutes Intravenous  Once 02/10/18 0242 02/10/18 0645       Assessment/Plan Acute kidney injury-improved  Anemia-most likely from chronic illness/rehydration Hx of Sarcoidosis Hx of CVA Nephrolithiasis- non obstructing Hypothyroid Hypokalemia-potassium is being replaced.  Septic shock with large sacral decubitus-MRSAPositive7/2019  Staphylococcus species - positive blood culture Yeast UTI Multiple skin ulcers Left hemiplegia, with history of cervical decompression/discectomy 2018 Bed-chairbound  FEN: IV fluids/NPO ID: Vancomycin/Zosyn 8/11 =>> day 4,  Diflucan 8/12 =>> day 3 DVT:  Lovenox Follow up:  TBD   Plan:  We will continue to try and contact her husband.  She needs surgical debridement of the site. Recommending operative debridement of devitalized tissue and unroofing of cavities to facilitate wound care.  Would need to be done under anesthesia in OR with open packing of wound.  She remains NPO.      LOS: 3 days    Mostyn Varnell 02/13/2018 [phone]

## 2018-02-13 NOTE — Care Management Important Message (Signed)
Important Message  Patient Details IM Letter given to Kathy/Case Manager to present to the Patient Name: Addaleigh Nicholls MRN: [id number] Date of Birth: [birth year]   Medicare Important Message Given:       Kerin Salen 02/13/2018, 12:02 PM

## 2018-02-13 NOTE — Progress Notes (Signed)
Patient no longer NPO and will be resume diet order per Modena Jansky PA verbal order d/t surgery cancellation.  Will continue to monitor.

## 2018-02-13 NOTE — Progress Notes (Addendum)
Occupational Therapy Treatment Patient Details Name: Natalie Macdonald MRN: [id number] DOB: 04-03-1956 Today's Date: 02/13/2018    History of present illness 62 y.o. 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 She presented with altered mental status found to have septic shock.   OT comments  NOTED THAT THE WRONG SPLINT WAS DELIVERED TO PT.  LEFT RESTING HAND WAS REQUESTED (may need to come through [employer]) [place].  I AM NOT SURE PT IS INTERESTED IN A DIFFERENT SPLINT.  She uses a copper sleeve for wrist at home and states that wrist cock up feels similar.  She participated minimally in OT today; stated she didn't feel well.  PIPs are tight joints. She did not want to be positioned for edema management but verbalizes understanding.  Will try to follow up one more time to see if she desires further intervention.    Follow Up Recommendations  Supervision/Assistance - 24 hour;Other (comment)    Equipment Recommendations  None recommended by OT    Recommendations for Other Services      Precautions / Restrictions Precautions Precautions: Fall Precaution Comments: sacral wound  Restrictions Weight Bearing Restrictions: No       Mobility Bed Mobility                  Transfers                      Balance                                           ADL either performed or assessed with clinical judgement   ADL                                               Vision       Perception     Praxis      Cognition Arousal/Alertness: Awake/alert;Lethargic Behavior During Therapy: Flat affect Overall Cognitive Status: No family/caregiver present to determine baseline cognitive functioning                                          Exercises     Shoulder  Instructions       General Comments went in to check on splint. Last OT requested L resting hand splint and a prefabricated wrist cock up was delivered. Pt may need resting hand splint either through [employer].  Getting to an OP clinic is another option, but this may be difficulty given PLOF. Pt reports that she wore wrist cock up for a long time without problems.  Unsure how long it had been off; skin looked fine, but we did not tolerance test it.   Educated on edema management--pt did not want hand posiitioned up, but she verbalizes understanding. Also educated on wiggling fingers.  Movement is limited.  Gently PROM/stretch provided.     Pertinent Vitals/ Pain       Pain Assessment: Faces Faces Pain Scale: Hurts little more Pain Location: LUE Pain Descriptors / Indicators: Grimacing Pain Intervention(s): Limited  activity within patient's tolerance;Monitored during session  Home Living                                          Prior Functioning/Environment              Frequency           Progress Toward Goals  OT Goals(current goals can now be found in the care plan section)  Progress towards OT goals: (slow progress)     Plan      Co-evaluation                 AM-PAC PT "6 Clicks" Daily Activity     Outcome Measure   Help from another person eating meals?: A Little Help from another person taking care of personal grooming?: A Lot Help from another person toileting, which includes using toliet, bedpan, or urinal?: Total Help from another person bathing (including washing, rinsing, drying)?: A Lot Help from another person to put on and taking off regular upper body clothing?: A Lot Help from another person to put on and taking off regular lower body clothing?: Total 6 Click Score: 11    End of Session        Activity Tolerance Patient limited by fatigue;Patient limited by pain   Patient Left in bed;with call bell/phone within reach    Nurse Communication          Time: 7588-3254 OT Time Calculation (min): 8 min  Charges: OT General Charges $OT Visit: 1 Visit OT Treatments $Therapeutic Activity: 8-22 mins  Lesle Chris, OTR/L [phone] 02/13/2018   Trenee Igoe 02/13/2018, 2:38 PM

## 2018-02-13 NOTE — Care Management Note (Signed)
Case Management Note  Patient Details  Name: Natalie Macdonald MRN: [id number] Date of Birth: [birth year]  Subjective/Objective: Patient ordered for overlay gell mattress-[facility] dme rep Jermaine aware of order,& will deliver to home @ d/c. Patient states she plans to d/c home w/spouse,has pcp(make own appt),has pharmacy,&transp but if transp needed she will inform staff. Continue to monitor for d/c needs.                    Action/Plan:d/c home.   Expected Discharge Date:  (unknown)               Expected Discharge Plan:  Home/Self Care  In-House Referral:     Discharge planning Services  CM Consult  Post Acute Care Choice:    Choice offered to:     DME Arranged:  Gel overlay DME Agency:  [place]:    [place] Agency:     Status of Service:  In process, will continue to follow  If discussed at Long Length of Stay Meetings, dates discussed:    Additional Comments:  Dessa Phi, RN 02/13/2018, 3:26 PM

## 2018-02-14 DIAGNOSIS — E11622 Type 2 diabetes mellitus with other skin ulcer: Secondary | ICD-10-CM

## 2018-02-14 LAB — CBC WITH DIFFERENTIAL/PLATELET
BASOS PCT: 0 %
Basophils Absolute: 0 10*3/uL (ref 0.0–0.1)
EOS ABS: 0.2 10*3/uL (ref 0.0–0.7)
Eosinophils Relative: 2 %
HCT: 25.5 % — ABNORMAL LOW (ref 36.0–46.0)
Hemoglobin: 8.3 g/dL — ABNORMAL LOW (ref 12.0–15.0)
Lymphocytes Relative: 33 %
Lymphs Abs: 3.7 10*3/uL (ref 0.7–4.0)
MCH: 27.6 pg (ref 26.0–34.0)
MCHC: 32.5 g/dL (ref 30.0–36.0)
MCV: 84.7 fL (ref 78.0–100.0)
Monocytes Absolute: 1.4 10*3/uL — ABNORMAL HIGH (ref 0.1–1.0)
Monocytes Relative: 13 %
NEUTROS PCT: 52 %
Neutro Abs: 5.9 10*3/uL (ref 1.7–7.7)
PLATELETS: 492 10*3/uL — AB (ref 150–400)
RBC: 3.01 MIL/uL — ABNORMAL LOW (ref 3.87–5.11)
RDW: 16.2 % — ABNORMAL HIGH (ref 11.5–15.5)
WBC: 11.2 10*3/uL — AB (ref 4.0–10.5)

## 2018-02-14 LAB — BASIC METABOLIC PANEL
Anion gap: 9 (ref 5–15)
BUN: 8 mg/dL (ref 8–23)
CALCIUM: 7.7 mg/dL — AB (ref 8.9–10.3)
CO2: 26 mmol/L (ref 22–32)
CREATININE: 0.78 mg/dL (ref 0.44–1.00)
Chloride: 100 mmol/L (ref 98–111)
GFR calc Af Amer: >60 mL/min (ref >60)
Glucose, Bld: 147 mg/dL — ABNORMAL HIGH (ref 70–99)
Potassium: 4 mmol/L (ref 3.5–5.1)
SODIUM: 135 mmol/L (ref 135–145)

## 2018-02-14 LAB — GLUCOSE, CAPILLARY
GLUCOSE-CAPILLARY: 143 mg/dL — AB (ref 70–99)
GLUCOSE-CAPILLARY: 206 mg/dL — AB (ref 70–99)
Glucose-Capillary: 191 mg/dL — ABNORMAL HIGH (ref 70–99)
Glucose-Capillary: 196 mg/dL — ABNORMAL HIGH (ref 70–99)

## 2018-02-14 NOTE — Progress Notes (Signed)
Pt lost IV access due to infiltration.  Pt refused IV reinsertion by both myself, and IV team. Pt educated about the necessity of IV antibiotics. Pt continuously refused. No family at the bedside at the time. On call K Schorr paged and made aware.

## 2018-02-14 NOTE — Progress Notes (Signed)
Occupational Therapy Treatment Patient Details Name: Natalie Macdonald MRN: [id number] DOB: Nov 23, 1955 Today's Date: 02/14/2018    History of present illness 62 y.o. 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 She presented with altered mental status found to have septic shock.   OT comments  Pt is not interested in resting hand splint--brought photo. She is participates minimally at this time.  Will sign off from acute OT  Follow Up Recommendations  Supervision/Assistance - 24 hour;Other (comment)    Equipment Recommendations  None recommended by OT    Recommendations for Other Services      Precautions / Restrictions Precautions Precautions: Fall Precaution Comments: sacral wound  Restrictions Weight Bearing Restrictions: No       Mobility Bed Mobility                  Transfers                      Balance                                           ADL either performed or assessed with clinical judgement   ADL                                               Vision       Perception     Praxis      Cognition Arousal/Alertness: Awake/alert;Lethargic Behavior During Therapy: Flat affect Overall Cognitive Status: Difficult to assess                                 General Comments: pt states she knows to position LUE up for edema but not willing to do this        Exercises     Shoulder Instructions       General Comments brought photo of resting hand splint:  pt states her splint at home works and she is not interested.  Wrist cock up with thumb component in room.  Pt was initially donning wrong.  She did not want to wear this due to swelling. Performed a couple of repetitions of exercise only    Pertinent Vitals/ Pain       Pain Assessment: Faces Faces Pain Scale:  Hurts little more Pain Location: LUE Pain Descriptors / Indicators: Grimacing Pain Intervention(s): Limited activity within patient's tolerance;Monitored during session  Home Living                                          Prior Functioning/Environment              Frequency           Progress Toward Goals  OT Goals(current goals can now be found in the care plan section)        Plan      Co-evaluation                 AM-PAC PT "6 Clicks" Daily  Activity     Outcome Measure   Help from another person eating meals?: A Little Help from another person taking care of personal grooming?: A Lot Help from another person toileting, which includes using toliet, bedpan, or urinal?: Total Help from another person bathing (including washing, rinsing, drying)?: A Lot Help from another person to put on and taking off regular upper body clothing?: A Lot Help from another person to put on and taking off regular lower body clothing?: Total 6 Click Score: 11    End of Session        Activity Tolerance Patient limited by fatigue;Patient limited by pain   Patient Left in bed;with call bell/phone within reach   Nurse Communication          Time: 0071-2197 OT Time Calculation (min): 10 min  Charges: OT General Charges $OT Visit: 1 Visit OT Treatments $Therapeutic Activity: 8-22 mins  Lesle Chris, OTR/L [phone] 02/14/2018   Malcolm 02/14/2018, 3:35 PM

## 2018-02-14 NOTE — Progress Notes (Signed)
Pharmacy Antibiotic Note  Natalie Macdonald is a 62 y.o. female admitted on 02/10/2018 with sepsis.  Pharmacy has been consulted for zosyn and vancomycin dosing.  02/14/2018  WBC 11.2 Scr 0.78, CrCl ~ 73mls/min (N) Afebrile Pt lost IV access this am, initially refused reinsertion but now has agreed to have IV re-started  Plan:  continue Zosyn 3.375gm IV q8h (4hr extended infusions)  Continue vancomycin at 1250mg  q24h  (AUC 469, SCr 0.96)  Fluconazole  100mg  IV q24h  Follow up on cultures to narrow abx  Height: 5\' 6"  (167.6 cm) Weight: 246 lb 14.6 oz (112 kg) IBW/kg (Calculated) : 59.3  Temp (24hrs), Avg:98.6 F (37 C), Min:98.4 F (36.9 C), Max:98.8 F (37.1 C)  Recent Labs  Lab 02/10/18 0236 02/10/18 0300 02/10/18 0517 02/11/18 0607 02/12/18 0802 02/13/18 0452 02/14/18 0752  WBC 23.8*  --   --  12.2* 11.5*  --  11.2*  CREATININE 4.21*  --   --  0.96 0.69 0.74 0.78  LATICACIDVEN  --  3.56* 2.20* 1.2  --   --   --     Estimated Creatinine Clearance: 91.4 mL/min (by C-G formula based on SCr of 0.78 mg/dL).    Allergies  Allergen Reactions  . Grapefruit Bioflavonoid Complex Hives, Swelling and Other (See Comments)    Caused her to get hives, swelling of the eye, throat   . Latex Hives  . Procaine Anaphylaxis  . Procaine Anaphylaxis  . Grapefruit Flavor [Flavoring Agent]   . Novolog [Insulin Aspart]     Okay to use Regular insulin (patient doesn't tolerate Novolog)      Antimicrobials this admission: 8/11 Zosyn >>  8/11 Vancomycin >>  8/12 Fluconazole (UTI) >>  Dose adjustments this admission:  Microbiology results: 8/11 BCx: BCID: 1 of 4 bottle staph species, no Mec A detected - CNS 8/11 BCx: 1 of 4 bottles GNR - still pending 8/11 UCx: >100k yeast 8/12 MRSA PCR: positive Thank you for allowing pharmacy to be a part of this patient's care.  Dolly Rias RPh 02/14/2018, 11:42 AM Pager [phone]

## 2018-02-14 NOTE — Progress Notes (Signed)
Patient ID: Natalie Macdonald, female   DOB: [birth year], 62 y.o.   MRN: [id number]  [facility]  I met with the patient, her husband, Natalie Macdonald, and another family member this afternoon.  They initially asked if I were a wound care specialist.  I told them I was not, that I was a Education officer, environmental.  They ask how many members were in my group and I told them 70.  They asked what was my plan for managing her sacral decubitus wound.  I explained to them that this was a very large wound.  There were areas of necrosis.  There were areas of infection.  There were areas of undermining.  At this point it is a difficult wound to take care of with dressing changes due to the above findings.  I recommended surgical debridement under general anesthesia with the patient in a prone position.  I explained that we would need to remove all devitalized tissue, infected tissue, and remove a moderate amount of skin overlying the undermined areas in order to expose these areas for wound care and dressing changes.  They asked me what was my plan for healing this wound.  I explained to them that there may be some healing of the wound once the wound was clean, but if the patient was not ambulatory and could not keep pressure off of the wound, it would never heal.  In my opinion, and in all likelihood, this wound will never heal to the point where there is complete coverage by healthy skin.  The patient, her husband, and their family member did not seem very happy with my assessment or recommendations.  They have requested to be evaluated by a wound care specialist.  In particular, they have asked for Dr. Dossie Der from the wound care center here at [facility].  I know Dr. Dellia Nims and will be happy to discuss the case with him if he becomes involved during her inpatient stay.  At this time we will await further notification from the primary service or from the family and patient  regarding our involvement in her care.  We will be happy to provide care if requested.  Armandina Gemma, [facility] Office: [phone]

## 2018-02-14 NOTE — Progress Notes (Signed)
PROGRESS NOTE    Natalie Macdonald  EHU:[id number] DOB: 06-02-1956 DOA: 02/10/2018 PCP: Clifton Custard, MD    Brief Narrative:  62 year old female who presented with altered mental status.  She does have significant past medical history for type 2 diabetes mellitus, dyslipidemia, hypertension, hypothyroidism sarcoidosis and chronic left hemiparesis (wheelchair-bound).  Patient was recently discharged from the hospital (01/10/2018) after being treated for septic shock related to sacral decubitus ulcer stage III infection with MRSA.  Reported 24 hours of decreased p.o. intake and altered mentation.  On her initial physical examination her blood pressure was 61/43, temperature 100.6, heart rate 122 respiratory rate 19, oxygen saturation 98%, pupils were point point bilaterally, she had a hoarse voice, lungs clear to auscultation, heart S1-S2 present and rhythmic, abdomen protuberant with no rebound or guarding, stage III large decubitus ulcer, positive drainage, smaller lesion in the thoracic area also stage III.  131, potassium 4.2, chloride 90, bicarb 24, glucose 191, BUN 41, creatinine 4.2, white count 23.8, hemoglobin 10.3, hematocrit 31.3, platelets 923 urinalysis specific gravity 1.019, white cells and 21-50.  CT of the abdomen with no acute abnormalities.  Chest x-ray hypoinflated.  EKG sinus tachycardia 122 beats per minutes, normal axis, T wave inversions in V4 V5 and V6, left bundle branch block.   Patient was admitted to the hospital working diagnosis of sepsis due to decubitus ulcer infection.   Assessment & Plan:   Principal Problem:   Septic shock ([place]) Active Problems:   Diabetes mellitus type 2 with complications ([place])   Left hemiplegia ([place])   Sacral decubitus ulcer, stage III ([place])   AKI (acute kidney injury) ([place])   Toxic encephalopathy  1. Sepsis due to decubitus ulcer MRSA infection, present on admission with gram negative bactremia.  Antibiotic therapy with IV vancomycin  and Zosyn, pending debridement but patient and her husband still no have decided.Patient has remained afebrile, wbc at 11.2, pending follow up blood cultures.  2. Urine infection due yeast (present on admission). Will contnue oral fluconazole.   3. HTN. On carvedilol with good toleration.    4. Dyspepsia. Continue pantoprazole bid and sucralfate with meals, patient with persistent pin, no nausea or vomiting.   5. Chronic back pain. Pain control with 30 mg morphine bid, and as needed oxycodone.   6. Hypothyroid. On levothyroxine.  7. T2DM. Glucose cover and monitoring with insulin sliding scale. Capillary glucose 123, 182, 159, 143, 191.   8. Obesity. On nutritional supplements.   DVT prophylaxis enoxaparin   Code Status: full Family Communication: no family at the bedside  Disposition Plan/ discharge barriers: pending surgical debridement    Consultants:   General surgery   Procedures:     Antimicrobials:   Vancomycin IV   Zosyn IV  Fluconazole IV.   Subjective: Continue to have epigastric pain, moderate in intensity, dull in nature, no radiation and no associated with nausea or vomiting. This am declined IV access but now is agreeable for IV antibiotics.   Objective: Vitals:   02/13/18 0529 02/13/18 1328 02/13/18 2220 02/14/18 0639  BP: 128/71 128/76 114/64 140/71  Pulse: 93 85 86 92  Resp: 18 14 20 20   Temp: 99.6 F (37.6 C) 98.4 F (36.9 C) 98.8 F (37.1 C) 98.7 F (37.1 C)  TempSrc: Oral Oral Oral Oral  SpO2: 97% 99% 98% 100%  Weight:      Height:        Intake/Output Summary (Last 24 hours) at 02/14/2018 1109 Last data filed at 02/14/2018 0600  Gross per 24 hour  Intake 958.18 ml  Output 400 ml  Net 558.18 ml   Filed Weights   02/10/18 0427  Weight: 112 kg    Examination:   General: Not in pain or dyspnea, deconditioned Neurology: Awake and alert, non focal  E ENT: positive pallor, no icterus, oral mucosa moist Cardiovascular:  No JVD. S1-S2 present, rhythmic, no gallops, rubs, or murmurs. Non pitting lower extremity edema. Pulmonary: decreased breath sounds bilaterally at bases, adequate air movement, no wheezing, rhonchi or rales. Gastrointestinal. Abdomen protuberant with no organomegaly, non tender, no rebound or guarding Skin. Large stage III to IV sacral ulcer.  Musculoskeletal: no joint deformities     Data Reviewed: I have personally reviewed following labs and imaging studies  CBC: Recent Labs  Lab 02/10/18 0236 02/11/18 0607 02/12/18 0802 02/14/18 0752  WBC 23.8* 12.2* 11.5* 11.2*  NEUTROABS 18.5  --   --  5.9  HGB 10.3* 7.3* 8.5* 8.3*  HCT 31.3* 22.4* 25.9* 25.5*  MCV 86.0 86.8 85.5 84.7  PLT 923* 598* 659* 937*   Basic Metabolic Panel: Recent Labs  Lab 02/10/18 0236 02/11/18 0607 02/12/18 0802 02/13/18 0452 02/14/18 0752  NA 131* 135 139  --  135  K 4.2 2.9* 4.2  --  4.0  CL 90* 106 104  --  100  CO2 24 22 25   --  26  GLUCOSE 191* 51* 92  --  147*  BUN 41* 20 10  --  8  CREATININE 4.21* 0.96 0.69 0.74 0.78  CALCIUM 8.1* 7.0* 8.2*  --  7.7*  MG  --   --  1.5*  --   --    GFR: Estimated Creatinine Clearance: 91.4 mL/min (by C-G formula based on SCr of 0.78 mg/dL). Liver Function Tests: Recent Labs  Lab 02/10/18 0236 02/12/18 0802  AST 38 27  ALT 27 27  ALKPHOS 129* 107  BILITOT 0.4 0.5  PROT 6.7 5.8*  ALBUMIN 1.8* 1.9*   No results for input(s): LIPASE, AMYLASE in the last 168 hours. No results for input(s): AMMONIA in the last 168 hours. Coagulation Profile: No results for input(s): INR, PROTIME in the last 168 hours. Cardiac Enzymes: Recent Labs  Lab 02/10/18 0236  TROPONINI <0.03   BNP (last 3 results) No results for input(s): PROBNP in the last 8760 hours. HbA1C: Recent Labs    02/12/18 0802  HGBA1C 8.3*   CBG: Recent Labs  Lab 02/13/18 0721 02/13/18 1146 02/13/18 1723 02/13/18 2230 02/14/18 0746  GLUCAP 123* 182* 159* 150* 143*   Lipid  Profile: No results for input(s): CHOL, HDL, LDLCALC, TRIG, CHOLHDL, LDLDIRECT in the last 72 hours. Thyroid Function Tests: No results for input(s): TSH, T4TOTAL, FREET4, T3FREE, THYROIDAB in the last 72 hours. Anemia Panel: No results for input(s): VITAMINB12, FOLATE, FERRITIN, TIBC, IRON, RETICCTPCT in the last 72 hours.    Radiology Studies: I have reviewed all of the imaging during this hospital visit personally     Scheduled Meds: . amitriptyline  100 mg Oral QHS  . carvedilol  25 mg Oral BID WC  . collagenase   Topical Daily  . enoxaparin (LOVENOX) injection  40 mg Subcutaneous Q24H  . feeding supplement (ENSURE ENLIVE)  237 mL Oral BID BM  . insulin regular  0-5 Units Subcutaneous QHS  . insulin regular  0-9 Units Subcutaneous TID WC  . levothyroxine  150 mcg Oral QAC breakfast  . morphine  30 mg Oral Q12H  . multivitamin with minerals  1 tablet Oral Daily  . mupirocin cream   Topical Daily  . oxybutynin  15 mg Oral Daily  . pantoprazole  40 mg Oral BID  . polyethylene glycol  17 g Oral BID  . pravastatin  20 mg Oral Daily  . vitamin B-6  250 mg Oral Daily  . sucralfate  1 g Oral TID WC & HS  . vitamin B-12  1,000 mcg Oral Daily  . vitamin E  400 Units Oral Daily   Continuous Infusions: . sodium chloride    . sodium chloride Stopped (02/13/18 1233)  . fluconazole (DIFLUCAN) IV Stopped (02/13/18 1119)  . piperacillin-tazobactam (ZOSYN)  IV 3.375 g (02/13/18 1949)  . vancomycin 1,250 mg (02/13/18 1724)     LOS: 4 days        Torry Istre Gerome Apley, MD [facility] Pager [phone]

## 2018-02-15 LAB — CBC WITH DIFFERENTIAL/PLATELET
BASOS ABS: 0 10*3/uL (ref 0.0–0.1)
Basophils Relative: 0 %
EOS PCT: 1 %
Eosinophils Absolute: 0.2 10*3/uL (ref 0.0–0.7)
HEMATOCRIT: 26.1 % — AB (ref 36.0–46.0)
Hemoglobin: 8.5 g/dL — ABNORMAL LOW (ref 12.0–15.0)
LYMPHS ABS: 3.4 10*3/uL (ref 0.7–4.0)
LYMPHS PCT: 25 %
MCH: 27.2 pg (ref 26.0–34.0)
MCHC: 32.6 g/dL (ref 30.0–36.0)
MCV: 83.7 fL (ref 78.0–100.0)
MONO ABS: 1.4 10*3/uL — AB (ref 0.1–1.0)
MONOS PCT: 10 %
NEUTROS ABS: 8.5 10*3/uL — AB (ref 1.7–7.7)
Neutrophils Relative %: 64 %
PLATELETS: 520 10*3/uL — AB (ref 150–400)
RBC: 3.12 MIL/uL — ABNORMAL LOW (ref 3.87–5.11)
RDW: 16 % — AB (ref 11.5–15.5)
WBC: 13.5 10*3/uL — ABNORMAL HIGH (ref 4.0–10.5)

## 2018-02-15 LAB — GLUCOSE, CAPILLARY
GLUCOSE-CAPILLARY: 260 mg/dL — AB (ref 70–99)
GLUCOSE-CAPILLARY: 174 mg/dL — AB (ref 70–99)
Glucose-Capillary: 177 mg/dL — ABNORMAL HIGH (ref 70–99)
Glucose-Capillary: 192 mg/dL — ABNORMAL HIGH (ref 70–99)

## 2018-02-15 LAB — BASIC METABOLIC PANEL
ANION GAP: 9 (ref 5–15)
BUN: 7 mg/dL — AB (ref 8–23)
CALCIUM: 7.7 mg/dL — AB (ref 8.9–10.3)
CO2: 26 mmol/L (ref 22–32)
Chloride: 97 mmol/L — ABNORMAL LOW (ref 98–111)
Creatinine, Ser: 0.75 mg/dL (ref 0.44–1.00)
GFR calc Af Amer: >60 mL/min (ref >60)
GFR calc non Af Amer: >60 mL/min (ref >60)
GLUCOSE: 183 mg/dL — AB (ref 70–99)
Potassium: 3.9 mmol/L (ref 3.5–5.1)
Sodium: 132 mmol/L — ABNORMAL LOW (ref 135–145)

## 2018-02-15 MED ORDER — SIMETHICONE 80 MG PO CHEW
80.0000 mg | CHEWABLE_TABLET | Freq: Once | ORAL | Status: AC
Start: 2018-02-15 — End: 2018-02-15
  Administered 2018-02-15: 80 mg via ORAL
  Filled 2018-02-15: qty 1

## 2018-02-15 MED ORDER — ALUM & MAG HYDROXIDE-SIMETH 200-200-20 MG/5ML PO SUSP
30.0000 mL | ORAL | Status: DC | PRN
  Administered 2018-02-21 – 2018-02-23 (×3): 30 mL via ORAL
  Filled 2018-02-15 (×4): qty 30

## 2018-02-15 NOTE — Care Management Important Message (Addendum)
Important Message  Patient Details IM Letter given to Kathy/Case Manager to present to the Patient Name: Natalie Macdonald MRN: [id number] Date of Birth: [birth year]   Medicare Important Message Given:  Yes    Kerin Salen 02/15/2018, 11:44 AMImportant Message  Patient Details  Name: Natalie Macdonald MRN: [id number] Date of Birth: Mar 29, 1956   Medicare Important Message Given:  Yes    Kerin Salen 02/15/2018, 11:44 AM

## 2018-02-15 NOTE — Progress Notes (Signed)
PROGRESS NOTE    Natalie Macdonald  EGB:[id number] DOB: April 18, 1956 DOA: 02/10/2018 PCP: Clifton Custard, MD    Brief Narrative:  62 year old female who presented with altered mental status. She does have significant past medical history for type 2 diabetes mellitus, dyslipidemia, hypertension, hypothyroidism sarcoidosis and chronicleft hemiparesis(wheelchair-bound).Patient was recently discharged from the hospital (01/10/2018)after being treated for septic shock related to sacral decubitus ulcer stage III infection with MRSA.Reported 24 hours of decreased p.o. intake and altered mentation. On her initial physical examination her blood pressure was 61/43, temperature 100.6, heart rate 122 respiratory rate 19, oxygen saturation 98%, pupils were point point bilaterally, she had a hoarse voice, lungs clear to auscultation, heart S1-S2 present and rhythmic, abdomen protuberant with no rebound or guarding, stage III large decubitus ulcer, positive drainage, smaller lesion in the thoracic area also stage III. 131, potassium 4.2, chloride 90, bicarb 24, glucose 191, BUN 41, creatinine 4.2, white count 23.8, hemoglobin 10.3, hematocrit 31.3, platelets 923 urinalysis specific gravity 1.019, white cells and 21-50. CT of the abdomen with no acute abnormalities. Chest x-ray hypoinflated. EKG sinus tachycardia 122 beats per minutes, normal axis, T wave inversions in V4 V5 and V6, left bundle branch block.  Patient was admitted to the hospital working diagnosis ofsepsisdue to decubitus ulcer infection.   Assessment & Plan:   Principal Problem:   Septic shock ([place]) Active Problems:   Diabetes mellitus type 2 with complications ([place])   Left hemiplegia ([place])   Sacral decubitus ulcer, stage III ([place])   AKI (acute kidney injury) ([place])   Toxic encephalopathy  1. Sepsis due to decubitus ulcer MRSAinfection, present on admission with gram negative bactremia. Will continue therapy with IV vancomycin  and Zosyn. Patient continue to decline MRI for now. Will attempt to get second surgical opinion per patient's husband request, if not able to do that then patient will follow up as outpatient with the wound clinic. Patient may need SNF and transportation to the wound clinic. Today with elevated wbc at 13,5, cultures have remained with no growth. Follow on further physical therapy recommendations.   2. Urine infection due yeast (present on admission). On oral fluconazole.   3. HTN. Continue blood pressure control with carvedilol.    4. Dyspepsia. Improved abdominal pain will continue pantoprazole bid and sucralfate with meals.   5. Chronic back pain. Continue pain control with 30 mg morphine bid, and as needed oxycodone.   6. Hypothyroid. Continue levothyroxine.  7. T2DM. Continue glucose cover and monitoring with insulin sliding scale. Capillary glucose 191, 206, 196, 177, 192  8. Obesity. Continue nutritional supplements.  DVT prophylaxisenoxaparin Code Status:full Family Communication:I spoke with patient's husband at the bedside and all questions were addressed.  Disposition Plan/ discharge barriers:pending surgical debridement   Consultants:  General surgery  Procedures:    Antimicrobials:  Vancomycin IV   Zosyn IV  Fluconazole IV.  Subjective: Patient continue to have back pain and abdominal pain, placed now on a air mattress. No nausea or vomiting, no fever or chills. Continue to decline debridement.   Objective: Vitals:   02/14/18 0639 02/14/18 1222 02/14/18 2110 02/15/18 0538  BP: 140/71 (!) 146/85 (!) 145/90 132/84  Pulse: 92 87 92 (!) 101  Resp: 20  18 16   Temp: 98.7 F (37.1 C) 97.6 F (36.4 C) 98.7 F (37.1 C) 99 F (37.2 C)  TempSrc: Oral Oral Oral Oral  SpO2: 100% 100% 96% 98%  Weight:      Height:  Intake/Output Summary (Last 24 hours) at 02/15/2018 1316 Last data filed at 02/14/2018 2259 Gross per 24 hour    Intake 172.49 ml  Output 1350 ml  Net -1177.51 ml   Filed Weights   02/10/18 0427  Weight: 112 kg    Examination:   General: Not in pain or dyspnea, deconditioned  Neurology: Awake and alert, non focal  E ENT: mild pallor, no icterus, oral mucosa moist Cardiovascular: No JVD. S1-S2 present, rhythmic, no gallops, rubs, or murmurs. Non pitting lower extremity edema. Pulmonary: vesicular breath sounds bilaterally, adequate air movement, no wheezing, rhonchi or rales. Gastrointestinal. Abdomen protuberant no organomegaly, non tender, no rebound or guarding Skin. Large decubitus ulcer stage IV.  Musculoskeletal: no joint deformities     Data Reviewed: I have personally reviewed following labs and imaging studies  CBC: Recent Labs  Lab 02/10/18 0236 02/11/18 0607 02/12/18 0802 02/14/18 0752 02/15/18 0548  WBC 23.8* 12.2* 11.5* 11.2* 13.5*  NEUTROABS 18.5  --   --  5.9 8.5*  HGB 10.3* 7.3* 8.5* 8.3* 8.5*  HCT 31.3* 22.4* 25.9* 25.5* 26.1*  MCV 86.0 86.8 85.5 84.7 83.7  PLT 923* 598* 659* 492* 163*   Basic Metabolic Panel: Recent Labs  Lab 02/10/18 0236 02/11/18 0607 02/12/18 0802 02/13/18 0452 02/14/18 0752 02/15/18 0548  NA 131* 135 139  --  135 132*  K 4.2 2.9* 4.2  --  4.0 3.9  CL 90* 106 104  --  100 97*  CO2 24 22 25   --  26 26  GLUCOSE 191* 51* 92  --  147* 183*  BUN 41* 20 10  --  8 7*  CREATININE 4.21* 0.96 0.69 0.74 0.78 0.75  CALCIUM 8.1* 7.0* 8.2*  --  7.7* 7.7*  MG  --   --  1.5*  --   --   --    GFR: Estimated Creatinine Clearance: 91.4 mL/min (by C-G formula based on SCr of 0.75 mg/dL). Liver Function Tests: Recent Labs  Lab 02/10/18 0236 02/12/18 0802  AST 38 27  ALT 27 27  ALKPHOS 129* 107  BILITOT 0.4 0.5  PROT 6.7 5.8*  ALBUMIN 1.8* 1.9*   No results for input(s): LIPASE, AMYLASE in the last 168 hours. No results for input(s): AMMONIA in the last 168 hours. Coagulation Profile: No results for input(s): INR, PROTIME in the last  168 hours. Cardiac Enzymes: Recent Labs  Lab 02/10/18 0236  TROPONINI <0.03   BNP (last 3 results) No results for input(s): PROBNP in the last 8760 hours. HbA1C: No results for input(s): HGBA1C in the last 72 hours. CBG: Recent Labs  Lab 02/14/18 1218 02/14/18 1749 02/14/18 2140 02/15/18 0739 02/15/18 1139  GLUCAP 191* 206* 196* 177* 192*   Lipid Profile: No results for input(s): CHOL, HDL, LDLCALC, TRIG, CHOLHDL, LDLDIRECT in the last 72 hours. Thyroid Function Tests: No results for input(s): TSH, T4TOTAL, FREET4, T3FREE, THYROIDAB in the last 72 hours. Anemia Panel: No results for input(s): VITAMINB12, FOLATE, FERRITIN, TIBC, IRON, RETICCTPCT in the last 72 hours.    Radiology Studies: I have reviewed all of the imaging during this hospital visit personally     Scheduled Meds: . amitriptyline  100 mg Oral QHS  . carvedilol  25 mg Oral BID WC  . collagenase   Topical Daily  . enoxaparin (LOVENOX) injection  40 mg Subcutaneous Q24H  . feeding supplement (ENSURE ENLIVE)  237 mL Oral BID BM  . insulin regular  0-5 Units Subcutaneous QHS  .  insulin regular  0-9 Units Subcutaneous TID WC  . levothyroxine  150 mcg Oral QAC breakfast  . morphine  30 mg Oral Q12H  . multivitamin with minerals  1 tablet Oral Daily  . mupirocin cream   Topical Daily  . oxybutynin  15 mg Oral Daily  . pantoprazole  40 mg Oral BID  . polyethylene glycol  17 g Oral BID  . pravastatin  20 mg Oral Daily  . vitamin B-6  250 mg Oral Daily  . sucralfate  1 g Oral TID WC & HS  . vitamin B-12  1,000 mcg Oral Daily  . vitamin E  400 Units Oral Daily   Continuous Infusions: . sodium chloride    . sodium chloride 250 mL (02/15/18 1232)  . fluconazole (DIFLUCAN) IV 100 mg (02/15/18 1115)  . piperacillin-tazobactam (ZOSYN)  IV 3.375 g (02/15/18 1232)  . vancomycin 1,250 mg (02/14/18 1652)     LOS: 5 days        Renardo Cheatum Gerome Apley, MD [facility] Pager [phone]

## 2018-02-16 DIAGNOSIS — R0689 Other abnormalities of breathing: Secondary | ICD-10-CM

## 2018-02-16 DIAGNOSIS — G92 Toxic encephalopathy: Secondary | ICD-10-CM

## 2018-02-16 DIAGNOSIS — E1169 Type 2 diabetes mellitus with other specified complication: Secondary | ICD-10-CM

## 2018-02-16 LAB — CBC WITH DIFFERENTIAL/PLATELET
Basophils Absolute: 0 10*3/uL (ref 0.0–0.1)
Basophils Relative: 0 %
EOS ABS: 0.2 10*3/uL (ref 0.0–0.7)
Eosinophils Relative: 2 %
HCT: 25.4 % — ABNORMAL LOW (ref 36.0–46.0)
Hemoglobin: 8.4 g/dL — ABNORMAL LOW (ref 12.0–15.0)
Lymphocytes Relative: 25 %
Lymphs Abs: 3 10*3/uL (ref 0.7–4.0)
MCH: 27.5 pg (ref 26.0–34.0)
MCHC: 33.1 g/dL (ref 30.0–36.0)
MCV: 83.3 fL (ref 78.0–100.0)
MONO ABS: 1.1 10*3/uL — AB (ref 0.1–1.0)
Monocytes Relative: 9 %
Neutro Abs: 7.6 10*3/uL (ref 1.7–7.7)
Neutrophils Relative %: 64 %
PLATELETS: 303 10*3/uL (ref 150–400)
RBC: 3.05 MIL/uL — AB (ref 3.87–5.11)
RDW: 16.3 % — ABNORMAL HIGH (ref 11.5–15.5)
WBC: 11.9 10*3/uL — AB (ref 4.0–10.5)

## 2018-02-16 LAB — CULTURE, BLOOD (ROUTINE X 2): SPECIAL REQUESTS: ADEQUATE

## 2018-02-16 LAB — CREATININE, SERUM: Creatinine, Ser: 0.84 mg/dL (ref 0.44–1.00)

## 2018-02-16 LAB — GLUCOSE, CAPILLARY
GLUCOSE-CAPILLARY: 249 mg/dL — AB (ref 70–99)
GLUCOSE-CAPILLARY: 205 mg/dL — AB (ref 70–99)
Glucose-Capillary: 146 mg/dL — ABNORMAL HIGH (ref 70–99)
Glucose-Capillary: 237 mg/dL — ABNORMAL HIGH (ref 70–99)

## 2018-02-16 LAB — VANCOMYCIN, PEAK: Vancomycin Pk: 28 ug/mL — ABNORMAL LOW (ref 30–40)

## 2018-02-16 MED ORDER — SIMETHICONE 80 MG PO CHEW
80.0000 mg | CHEWABLE_TABLET | Freq: Four times a day (QID) | ORAL | Status: DC | PRN
  Administered 2018-02-16 – 2018-02-23 (×4): 80 mg via ORAL
  Filled 2018-02-16 (×4): qty 1

## 2018-02-16 NOTE — Progress Notes (Signed)
PROGRESS NOTE    Natalie Macdonald  TIR:[id number] DOB: 29-Nov-1955 DOA: 02/10/2018 PCP: Clifton Custard, MD    Brief Narrative:  61 year old female who presented with altered mental status. She does have significant past medical history for type 2 diabetes mellitus, dyslipidemia, hypertension, hypothyroidism sarcoidosis and chronicleft hemiparesis(wheelchair-bound).Patient was recently discharged from the hospital (01/10/2018)after being treated for septic shock related to sacral decubitus ulcer stage III infection with MRSA.Reported 24 hours of decreased p.o. intake and altered mentation. On her initial physical examination her blood pressure was 61/43, temperature 100.6, heart rate 122 respiratory rate 19, oxygen saturation 98%, pupils were point point bilaterally, she had a hoarse voice, lungs clear to auscultation, heart S1-S2 present and rhythmic, abdomen protuberant with no rebound or guarding, stage III large decubitus ulcer, positive drainage, smaller lesion in the thoracic area also stage III. 131, potassium 4.2, chloride 90, bicarb 24, glucose 191, BUN 41, creatinine 4.2, white count 23.8, hemoglobin 10.3, hematocrit 31.3, platelets 923 urinalysis specific gravity 1.019, white cells and 21-50. CT of the abdomen with no acute abnormalities. Chest x-ray hypoinflated. EKG sinus tachycardia 122 beats per minutes, normal axis, T wave inversions in V4 V5 and V6, left bundle branch block.  Patient was admitted to the hospital working diagnosis ofsepsisdue to decubitus ulcer infection.   Assessment & Plan:   Principal Problem:   Septic shock ([place]) Active Problems:   Diabetes mellitus type 2 with complications ([place])   Left hemiplegia ([place])   Sacral decubitus ulcer, stage III ([place])   AKI (acute kidney injury) ([place])   Toxic encephalopathy  1. Sepsis due to decubitus ulcer MRSAinfection, present on admissionwith gram negative bactremia.On IV vancomycinand Zosyn #6 continue to  decline MRI and debridement. Will contact Dr. Dellia Nims from wound clinic for further advice. For now will continue local wound care. WBC today down to 11.9 from 13,5. Will continue to follow cell count and temperature curve.   2. Urine infection due yeast(present on admission). Continue oralfluconazole #4/8.   3. HTN.Oncarvedilol for blood pressure control.     4. Dyspepsia. Continue antiacid therapy withpantoprazole bid and sucralfate with meals.   5. Chronic back pain.Home regimen with30 mg morphine bid, and as needed oxycodone.   6. Hypothyroid.Onlevothyroxine.  7. T2DM.Continue glucose cover and monitoring with insulin sliding scale. Capillary glucose192, 260, 174, 146, 249.   8. Obesity.On nutritional supplements.  DVT prophylaxisenoxaparin Code Status:full Family Communication:no family at the bedside   Disposition Plan/ discharge barriers:pending clinical improvement.    Consultants:  General surgery  Procedures:    Antimicrobials:  Vancomycin IV   Zosyn IV Fluconazole   Subjective: Patient complains of back pain and abdominal pain, no nausea or vomiting, pain continue to be controlled with analgesics.   Objective: Vitals:   02/15/18 0538 02/15/18 2208 02/16/18 0521 02/16/18 1202  BP: 132/84 127/73 128/74 116/69  Pulse: (!) 101 87 93 89  Resp: 16 20 18 14   Temp: 99 F (37.2 C) 98.5 F (36.9 C) 98.7 F (37.1 C) 98.5 F (36.9 C)  TempSrc: Oral Oral Oral Oral  SpO2: 98% 100% 100% 100%  Weight:      Height:        Intake/Output Summary (Last 24 hours) at 02/16/2018 1222 Last data filed at 02/16/2018 1206 Gross per 24 hour  Intake 1231.62 ml  Output 1400 ml  Net -168.38 ml   Filed Weights   02/10/18 0427  Weight: 112 kg    Examination:   General: Not in pain or dyspnea, deconditioned  Neurology: Awake and alert, non focal  E ENT: mild pallor, no icterus, oral mucosa moist Cardiovascular: No JVD. S1-S2  present, rhythmic, no gallops, rubs, or murmurs. No lower extremity edema. Pulmonary: vesicular breath sounds bilaterally, adequate air movement, no wheezing, rhonchi or rales. Gastrointestinal. Abdomen protuberant with no organomegaly, non tender, no rebound or guarding Skin. Very large stage IV sacrum decubitus ulcer.  Musculoskeletal: no joint deformities     Data Reviewed: I have personally reviewed following labs and imaging studies  CBC: Recent Labs  Lab 02/10/18 0236 02/11/18 0607 02/12/18 0802 02/14/18 0752 02/15/18 0548 02/16/18 1032  WBC 23.8* 12.2* 11.5* 11.2* 13.5* 11.9*  NEUTROABS 18.5  --   --  5.9 8.5* 7.6  HGB 10.3* 7.3* 8.5* 8.3* 8.5* 8.4*  HCT 31.3* 22.4* 25.9* 25.5* 26.1* 25.4*  MCV 86.0 86.8 85.5 84.7 83.7 83.3  PLT 923* 598* 659* 492* 520* 621   Basic Metabolic Panel: Recent Labs  Lab 02/10/18 0236 02/11/18 0607 02/12/18 0802 02/13/18 0452 02/14/18 0752 02/15/18 0548 02/16/18 0527  NA 131* 135 139  --  135 132*  --   K 4.2 2.9* 4.2  --  4.0 3.9  --   CL 90* 106 104  --  100 97*  --   CO2 24 22 25   --  26 26  --   GLUCOSE 191* 51* 92  --  147* 183*  --   BUN 41* 20 10  --  8 7*  --   CREATININE 4.21* 0.96 0.69 0.74 0.78 0.75 0.84  CALCIUM 8.1* 7.0* 8.2*  --  7.7* 7.7*  --   MG  --   --  1.5*  --   --   --   --    GFR: Estimated Creatinine Clearance: 87 mL/min (by C-G formula based on SCr of 0.84 mg/dL). Liver Function Tests: Recent Labs  Lab 02/10/18 0236 02/12/18 0802  AST 38 27  ALT 27 27  ALKPHOS 129* 107  BILITOT 0.4 0.5  PROT 6.7 5.8*  ALBUMIN 1.8* 1.9*   No results for input(s): LIPASE, AMYLASE in the last 168 hours. No results for input(s): AMMONIA in the last 168 hours. Coagulation Profile: No results for input(s): INR, PROTIME in the last 168 hours. Cardiac Enzymes: Recent Labs  Lab 02/10/18 0236  TROPONINI <0.03   BNP (last 3 results) No results for input(s): PROBNP in the last 8760 hours. HbA1C: No results for  input(s): HGBA1C in the last 72 hours. CBG: Recent Labs  Lab 02/15/18 1139 02/15/18 1650 02/15/18 2214 02/16/18 0727 02/16/18 1154  GLUCAP 192* 260* 174* 146* 249*   Lipid Profile: No results for input(s): CHOL, HDL, LDLCALC, TRIG, CHOLHDL, LDLDIRECT in the last 72 hours. Thyroid Function Tests: No results for input(s): TSH, T4TOTAL, FREET4, T3FREE, THYROIDAB in the last 72 hours. Anemia Panel: No results for input(s): VITAMINB12, FOLATE, FERRITIN, TIBC, IRON, RETICCTPCT in the last 72 hours.    Radiology Studies: I have reviewed all of the imaging during this hospital visit personally     Scheduled Meds: . amitriptyline  100 mg Oral QHS  . carvedilol  25 mg Oral BID WC  . collagenase   Topical Daily  . enoxaparin (LOVENOX) injection  40 mg Subcutaneous Q24H  . feeding supplement (ENSURE ENLIVE)  237 mL Oral BID BM  . insulin regular  0-5 Units Subcutaneous QHS  . insulin regular  0-9 Units Subcutaneous TID WC  . levothyroxine  150 mcg Oral QAC breakfast  . morphine  30 mg Oral Q12H  . multivitamin with minerals  1 tablet Oral Daily  . mupirocin cream   Topical Daily  . oxybutynin  15 mg Oral Daily  . pantoprazole  40 mg Oral BID  . polyethylene glycol  17 g Oral BID  . pravastatin  20 mg Oral Daily  . vitamin B-6  250 mg Oral Daily  . sucralfate  1 g Oral TID WC & HS  . vitamin B-12  1,000 mcg Oral Daily  . vitamin E  400 Units Oral Daily   Continuous Infusions: . sodium chloride    . sodium chloride 250 mL (02/15/18 1232)  . fluconazole (DIFLUCAN) IV 100 mg (02/16/18 1055)  . piperacillin-tazobactam (ZOSYN)  IV 3.375 g (02/16/18 0335)  . vancomycin 1,250 mg (02/15/18 1802)     LOS: 6 days        Mauricio Gerome Apley, MD [facility] Pager [phone]

## 2018-02-16 NOTE — Progress Notes (Signed)
Pharmacy Antibiotic Note  Natalie Macdonald is a 62 y.o. female admitted on 02/10/2018 with sepsis.  Pharmacy has been consulted for zosyn and vancomycin dosing.  02/16/2018  8/11 Blood Cx with GNR resulted today with Bacteroides fragilis, Zosyn will cover Repeat Blood Cx 8/14 - no growth Vancomycin peak level tonight, trough level prior to 8/18 dose  Plan:  Continue Zosyn 3.375gm IV q8h (4hr extended infusions)  Continue vancomycin at 1250mg  q24h  (AUC 469, SCr 0.96)  Fluconazole  100mg  IV q24h  Pt lost IV access 8/15, initially refused reinsertion but now has agreed to have IV re-started  Height: 5\' 6"  (167.6 cm) Weight: 246 lb 14.6 oz (112 kg) IBW/kg (Calculated) : 59.3  Temp (24hrs), Avg:98.6 F (37 C), Min:98.5 F (36.9 C), Max:98.7 F (37.1 C)  Recent Labs  Lab 02/10/18 0300 02/10/18 0517 02/11/18 0607 02/12/18 0802 02/13/18 0452 02/14/18 0752 02/15/18 0548 02/16/18 0527 02/16/18 1032  WBC  --   --  12.2* 11.5*  --  11.2* 13.5*  --  11.9*  CREATININE  --   --  0.96 0.69 0.74 0.78 0.75 0.84  --   LATICACIDVEN 3.56* 2.20* 1.2  --   --   --   --   --   --     Estimated Creatinine Clearance: 87 mL/min (by C-G formula based on SCr of 0.84 mg/dL).    Allergies  Allergen Reactions  . Grapefruit Bioflavonoid Complex Hives, Swelling and Other (See Comments)    Caused her to get hives, swelling of the eye, throat   . Latex Hives  . Procaine Anaphylaxis  . Procaine Anaphylaxis  . Grapefruit Flavor [Flavoring Agent]   . Novolog [Insulin Aspart]     Okay to use Regular insulin (patient doesn't tolerate Novolog)     Antimicrobials this admission: 8/11 Zosyn >>  8/11 Vancomycin >>  8/12 Fluconazole (UTI) >>  Dose adjustments this admission: Vancomycin peak 8/17: Vancomycin trough 8/18:  Microbiology results: 8/11 BCx: BCID: 1 of 4 bottle staph species, no Mec A detected - CNS 8/11 BCx: 1 of 4 bottles GNR - Bacteroides fragilis 8/11 UCx: >100k yeast 8/12 MRSA  PCR: positive Thank you for allowing pharmacy to be a part of this patient's care.  Minda Ditto PharmD Pager [phone] 02/16/2018, 7:28 PM

## 2018-02-17 DIAGNOSIS — E1159 Type 2 diabetes mellitus with other circulatory complications: Secondary | ICD-10-CM

## 2018-02-17 LAB — BASIC METABOLIC PANEL
ANION GAP: 9 (ref 5–15)
BUN: 6 mg/dL — ABNORMAL LOW (ref 8–23)
CALCIUM: 7.8 mg/dL — AB (ref 8.9–10.3)
CHLORIDE: 96 mmol/L — AB (ref 98–111)
CO2: 28 mmol/L (ref 22–32)
Creatinine, Ser: 0.81 mg/dL (ref 0.44–1.00)
GFR calc non Af Amer: >60 mL/min (ref >60)
GLUCOSE: 187 mg/dL — AB (ref 70–99)
POTASSIUM: 3.9 mmol/L (ref 3.5–5.1)
Sodium: 133 mmol/L — ABNORMAL LOW (ref 135–145)

## 2018-02-17 LAB — CBC WITH DIFFERENTIAL/PLATELET
BASOS ABS: 0 10*3/uL (ref 0.0–0.1)
Basophils Relative: 0 %
Eosinophils Absolute: 0.2 10*3/uL (ref 0.0–0.7)
Eosinophils Relative: 2 %
HEMATOCRIT: 25.3 % — AB (ref 36.0–46.0)
HEMOGLOBIN: 8.1 g/dL — AB (ref 12.0–15.0)
LYMPHS PCT: 31 %
Lymphs Abs: 3.9 10*3/uL (ref 0.7–4.0)
MCH: 27.4 pg (ref 26.0–34.0)
MCHC: 32 g/dL (ref 30.0–36.0)
MCV: 85.5 fL (ref 78.0–100.0)
Monocytes Absolute: 1.1 10*3/uL — ABNORMAL HIGH (ref 0.1–1.0)
Monocytes Relative: 9 %
NEUTROS ABS: 7.4 10*3/uL (ref 1.7–7.7)
NEUTROS PCT: 58 %
Platelets: 465 10*3/uL — ABNORMAL HIGH (ref 150–400)
RBC: 2.96 MIL/uL — AB (ref 3.87–5.11)
RDW: 16.6 % — ABNORMAL HIGH (ref 11.5–15.5)
WBC: 12.6 10*3/uL — ABNORMAL HIGH (ref 4.0–10.5)

## 2018-02-17 LAB — GLUCOSE, CAPILLARY
GLUCOSE-CAPILLARY: 199 mg/dL — AB (ref 70–99)
GLUCOSE-CAPILLARY: 224 mg/dL — AB (ref 70–99)
GLUCOSE-CAPILLARY: 208 mg/dL — AB (ref 70–99)

## 2018-02-17 LAB — VANCOMYCIN, TROUGH: Vancomycin Tr: 10 ug/mL — ABNORMAL LOW (ref 15–20)

## 2018-02-17 MED ORDER — INSULIN DETEMIR 100 UNIT/ML ~~LOC~~ SOLN
10.0000 [IU] | Freq: Every day | SUBCUTANEOUS | Status: DC
  Filled 2018-02-17: qty 0.1

## 2018-02-17 MED ORDER — GABAPENTIN 300 MG PO CAPS
300.0000 mg | ORAL_CAPSULE | Freq: Three times a day (TID) | ORAL | Status: DC
  Administered 2018-02-17 – 2018-02-26 (×28): 300 mg via ORAL
  Filled 2018-02-17 (×28): qty 1

## 2018-02-17 MED ORDER — INSULIN GLARGINE 100 UNIT/ML ~~LOC~~ SOLN
10.0000 [IU] | Freq: Every day | SUBCUTANEOUS | Status: DC
  Administered 2018-02-17 – 2018-02-26 (×10): 10 [IU] via SUBCUTANEOUS
  Filled 2018-02-17 (×10): qty 0.1

## 2018-02-17 MED ORDER — VANCOMYCIN HCL 10 G IV SOLR
1500.0000 mg | INTRAVENOUS | Status: DC
  Administered 2018-02-18: 1500 mg via INTRAVENOUS
  Filled 2018-02-17 (×2): qty 1500

## 2018-02-17 NOTE — Progress Notes (Signed)
Dressing change done to sacral/bilateral buttocks wound. Pressure wound is very large, malodorous, draining large amounts of purulent drainage. Wound was packed with moist kerlex and ABD pad was placed on top. No tape was used. Pt lying on right side at this time.

## 2018-02-17 NOTE — Progress Notes (Signed)
Pharmacy Antibiotic Note  Natalie Macdonald is a 62 y.o. female admitted on 02/10/2018 with sepsis.  Pharmacy has been consulted for zosyn and vancomycin dosing.  02/17/2018  Day 7 full abx 8/11 Blood Cx with GNR resulted 8/17 with Bacteroides fragilis, Zosyn will cover Repeat Blood Cx 8/14 - no growth Vancomycin peak level 8/17 = 28, trough level prior to 8/18 dose = 10 Calculated Vancomycin dose increased to 1500mg  q24  Plan:  Continue Zosyn 3.375gm IV q8h (4hr extended infusions)  Increase Vancomycin to 1500mg  q24h beginnning with 8/19 dose  Fluconazole 200mg  x1, then 100mg  IV q24h from 8/13  Height: 5\' 6"  (167.6 cm) Weight: 246 lb 14.6 oz (112 kg) IBW/kg (Calculated) : 59.3  Temp (24hrs), Avg:99.2 F (37.3 C), Min:99 F (37.2 C), Max:99.4 F (37.4 C)  Recent Labs  Lab 02/11/18 0607 02/12/18 0802 02/13/18 0452 02/14/18 0752 02/15/18 0548 02/16/18 0527 02/16/18 1032 02/16/18 2022 02/17/18 0526 02/17/18 1545  WBC 12.2* 11.5*  --  11.2* 13.5*  --  11.9*  --  12.6*  --   CREATININE 0.96 0.69 0.74 0.78 0.75 0.84  --   --  0.81  --   LATICACIDVEN 1.2  --   --   --   --   --   --   --   --   --   VANCOTROUGH  --   --   --   --   --   --   --   --   --  10*  VANCOPEAK  --   --   --   --   --   --   --  28*  --   --     Estimated Creatinine Clearance: 90.2 mL/min (by C-G formula based on SCr of 0.81 mg/dL).    Allergies  Allergen Reactions  . Grapefruit Bioflavonoid Complex Hives, Swelling and Other (See Comments)    Caused her to get hives, swelling of the eye, throat   . Latex Hives  . Procaine Anaphylaxis  . Procaine Anaphylaxis  . Grapefruit Flavor [Flavoring Agent]   . Novolog [Insulin Aspart]     Okay to use Regular insulin (patient doesn't tolerate Novolog)     Antimicrobials this admission: 8/11 Zosyn >>  8/11 Vancomycin >>  8/12 Fluconazole (UTI) >>  Dose adjustments this admission: Vancomycin peak 8/17: 28 Vancomycin trough 8/18: 10  Microbiology  results: 8/11 BCx: BCID: 1 of 4 bottle staph species, no Mec A detected - CNS 8/11 BCx: 1 of 4 bottles GNR - Bacteroides fragilis 8/11 UCx: >100k yeast 8/12 MRSA PCR: positive Thank you for allowing pharmacy to be a part of this patient's care.  Minda Ditto PharmD Pager [phone] 02/17/2018, 5:10 PM

## 2018-02-17 NOTE — Progress Notes (Signed)
PROGRESS NOTE    Natalie Macdonald  ASN:[id number] DOB: 1955/07/04 DOA: 02/10/2018 PCP: Clifton Custard, MD    Brief Narrative:  62 year old female who presented with altered mental status. She does have significant past medical history for type 2 diabetes mellitus, dyslipidemia, hypertension, hypothyroidism sarcoidosis and chronicleft hemiparesis(wheelchair-bound).Patient was recently discharged from the hospital (01/10/2018)after being treated for septic shock related to sacral decubitus ulcer stage III infection with MRSA.Reported 24 hours of decreased p.o. intake and altered mentation. On her initial physical examination her blood pressure was 61/43, temperature 100.6, heart rate 122 respiratory rate 19, oxygen saturation 98%, pupils were point point bilaterally, she had a hoarse voice, lungs clear to auscultation, heart S1-S2 present and rhythmic, abdomen protuberant with no rebound or guarding, stage III large decubitus ulcer, positive drainage, smaller lesion in the thoracic area also stage III. 131, potassium 4.2, chloride 90, bicarb 24, glucose 191, BUN 41, creatinine 4.2, white count 23.8, hemoglobin 10.3, hematocrit 31.3, platelets 923 urinalysis specific gravity 1.019, white cells and 21-50. CT of the abdomen with no acute abnormalities. Chest x-ray hypoinflated. EKG sinus tachycardia 122 beats per minutes, normal axis, T wave inversions in V4 V5 and V6, left bundle branch block.  Patient was admitted to the hospital working diagnosis ofsepsisdue to decubitus ulcer infection.   Assessment & Plan:   Principal Problem:   Septic shock ([place]) Active Problems:   Diabetes mellitus type 2 with complications ([place])   Left hemiplegia ([place])   Sacral decubitus ulcer, stage III ([place])   AKI (acute kidney injury) ([place])   Toxic encephalopathy   1. Sepsis due to decubitus ulcer MRSAinfection, present on admissionwith gram negative bactremia.Continue with IV vancomycinand Zosyn  #7. WBC at 12,6, patient has remained afebrile, continue to decline further MRI and surgical debridement. Lesion is very extensive and deep. I agree with debridement, and I will talk to her husband in order to have procedure now that patient is hospitalized.   2. Urine infection due yeast(present on admission).Antibiotic therapy with oral fluconazole #5/8.   3. HTN.Continue carvedilol for blood pressure control, with good toleration.   4. Dyspepsia.Continue pantoprazole bid and sucralfate with meals.  5. Chronic back pain.Continue30 mg morphine bid,  PRN oxycodone and weill resume gabapentin.   6. Hypothyroid.Continue with levothyroxine.  7. T2DM.Glucose cover and monitoring with insulin sliding scale. Capillary glucose249, 205, 237, 199, 224; had 14 units coverage, will add basal insulin with levimir, 10 units and continue titration.   8. Obesity.Continue with nutritional supplements.Patient is non ambulatory.   DVT prophylaxisenoxaparin Code Status:full Family Communication:no family at the bedside   Disposition Plan/ discharge barriers:pending clinical improvement.    Consultants:  General surgery  Procedures:    Antimicrobials:  Vancomycin IV   Zosyn IV Fluconazole     Subjective: Patient continue to have pain, no generalized, asking for gabapentin to be resumed, no nausea or vomiting, no chest pain or dyspnea.   Objective: Vitals:   02/16/18 1202 02/16/18 1203 02/16/18 2028 02/17/18 0418  BP: 116/69  139/82 117/71  Pulse: 89 89 93 98  Resp: 14  20 18   Temp: 98.5 F (36.9 C)  99 F (37.2 C) 99.3 F (37.4 C)  TempSrc: Oral  Oral Oral  SpO2: 100% 100% 100% 99%  Weight:      Height:        Intake/Output Summary (Last 24 hours) at 02/17/2018 1143 Last data filed at 02/17/2018 1108 Gross per 24 hour  Intake 1407.73 ml  Output 700 ml  Net  707.73 ml   Filed Weights   02/10/18 0427  Weight: 112 kg    Examination:    General: Not in pain or dyspnea, deconditioned  Neurology: Awake and alert, non focal  E ENT: mild pallor, no icterus, oral mucosa moist Cardiovascular: No JVD. S1-S2 present, rhythmic, no gallops, rubs, or murmurs. +++ non pitting lower extremity edema. Pulmonary: positive breath sounds bilaterally, adequate air movement, no wheezing, rhonchi or rales. Gastrointestinal. Abdomen protuberant with no organomegaly, non tender, no rebound or guarding Skin. No rashes Musculoskeletal: no joint deformities         Data Reviewed: I have personally reviewed following labs and imaging studies  CBC: Recent Labs  Lab 02/12/18 0802 02/14/18 0752 02/15/18 0548 02/16/18 1032 02/17/18 0526  WBC 11.5* 11.2* 13.5* 11.9* 12.6*  NEUTROABS  --  5.9 8.5* 7.6 7.4  HGB 8.5* 8.3* 8.5* 8.4* 8.1*  HCT 25.9* 25.5* 26.1* 25.4* 25.3*  MCV 85.5 84.7 83.7 83.3 85.5  PLT 659* 492* 520* 303 366*   Basic Metabolic Panel: Recent Labs  Lab 02/11/18 0607 02/12/18 0802 02/13/18 0452 02/14/18 0752 02/15/18 0548 02/16/18 0527 02/17/18 0526  NA 135 139  --  135 132*  --  133*  K 2.9* 4.2  --  4.0 3.9  --  3.9  CL 106 104  --  100 97*  --  96*  CO2 22 25  --  26 26  --  28  GLUCOSE 51* 92  --  147* 183*  --  187*  BUN 20 10  --  8 7*  --  6*  CREATININE 0.96 0.69 0.74 0.78 0.75 0.84 0.81  CALCIUM 7.0* 8.2*  --  7.7* 7.7*  --  7.8*  MG  --  1.5*  --   --   --   --   --    GFR: Estimated Creatinine Clearance: 90.2 mL/min (by C-G formula based on SCr of 0.81 mg/dL). Liver Function Tests: Recent Labs  Lab 02/12/18 0802  AST 27  ALT 27  ALKPHOS 107  BILITOT 0.5  PROT 5.8*  ALBUMIN 1.9*   No results for input(s): LIPASE, AMYLASE in the last 168 hours. No results for input(s): AMMONIA in the last 168 hours. Coagulation Profile: No results for input(s): INR, PROTIME in the last 168 hours. Cardiac Enzymes: No results for input(s): CKTOTAL, CKMB, CKMBINDEX, TROPONINI in the last 168 hours. BNP  (last 3 results) No results for input(s): PROBNP in the last 8760 hours. HbA1C: No results for input(s): HGBA1C in the last 72 hours. CBG: Recent Labs  Lab 02/16/18 0727 02/16/18 1154 02/16/18 1752 02/16/18 2036 02/17/18 0725  GLUCAP 146* 249* 205* 237* 199*   Lipid Profile: No results for input(s): CHOL, HDL, LDLCALC, TRIG, CHOLHDL, LDLDIRECT in the last 72 hours. Thyroid Function Tests: No results for input(s): TSH, T4TOTAL, FREET4, T3FREE, THYROIDAB in the last 72 hours. Anemia Panel: No results for input(s): VITAMINB12, FOLATE, FERRITIN, TIBC, IRON, RETICCTPCT in the last 72 hours.    Radiology Studies: I have reviewed all of the imaging during this hospital visit personally     Scheduled Meds: . amitriptyline  100 mg Oral QHS  . carvedilol  25 mg Oral BID WC  . collagenase   Topical Daily  . enoxaparin (LOVENOX) injection  40 mg Subcutaneous Q24H  . feeding supplement (ENSURE ENLIVE)  237 mL Oral BID BM  . insulin regular  0-5 Units Subcutaneous QHS  . insulin regular  0-9 Units Subcutaneous TID WC  .  levothyroxine  150 mcg Oral QAC breakfast  . morphine  30 mg Oral Q12H  . multivitamin with minerals  1 tablet Oral Daily  . mupirocin cream   Topical Daily  . oxybutynin  15 mg Oral Daily  . pantoprazole  40 mg Oral BID  . polyethylene glycol  17 g Oral BID  . pravastatin  20 mg Oral Daily  . vitamin B-6  250 mg Oral Daily  . sucralfate  1 g Oral TID WC & HS  . vitamin B-12  1,000 mcg Oral Daily  . vitamin E  400 Units Oral Daily   Continuous Infusions: . sodium chloride    . sodium chloride 250 mL (02/15/18 1232)  . fluconazole (DIFLUCAN) IV 100 mg (02/17/18 1102)  . piperacillin-tazobactam (ZOSYN)  IV 3.375 g (02/17/18 0403)  . vancomycin 1,250 mg (02/16/18 1641)     LOS: 7 days        Abhiram Criado Gerome Apley, MD [facility] Pager [phone]

## 2018-02-17 NOTE — Progress Notes (Signed)
Dressing packing was done twice this shift. ABD pads were changed 5 times d/t wound draining large amounts of purulent drainage. Purewick catheter in place and has been functioning correctly this shift. Pt has been turned or respoitioned at least every 2 hours. Husband now at bedside.

## 2018-02-18 ENCOUNTER — Inpatient Hospital Stay (HOSPITAL_COMMUNITY): Payer: Medicare Other

## 2018-02-18 DIAGNOSIS — E11628 Type 2 diabetes mellitus with other skin complications: Secondary | ICD-10-CM

## 2018-02-18 LAB — CBC WITH DIFFERENTIAL/PLATELET
BASOS ABS: 0 10*3/uL (ref 0.0–0.1)
BASOS PCT: 0 %
Eosinophils Absolute: 0.2 10*3/uL (ref 0.0–0.7)
Eosinophils Relative: 1 %
HCT: 24.7 % — ABNORMAL LOW (ref 36.0–46.0)
Hemoglobin: 7.8 g/dL — ABNORMAL LOW (ref 12.0–15.0)
Lymphocytes Relative: 36 %
Lymphs Abs: 4.3 10*3/uL — ABNORMAL HIGH (ref 0.7–4.0)
MCH: 27.5 pg (ref 26.0–34.0)
MCHC: 31.6 g/dL (ref 30.0–36.0)
MCV: 87 fL (ref 78.0–100.0)
MONO ABS: 1.1 10*3/uL — AB (ref 0.1–1.0)
Monocytes Relative: 9 %
Neutro Abs: 6.2 10*3/uL (ref 1.7–7.7)
Neutrophils Relative %: 54 %
PLATELETS: 397 10*3/uL (ref 150–400)
RBC: 2.84 MIL/uL — ABNORMAL LOW (ref 3.87–5.11)
RDW: 17.1 % — AB (ref 11.5–15.5)
WBC: 11.8 10*3/uL — AB (ref 4.0–10.5)

## 2018-02-18 LAB — GLUCOSE, CAPILLARY
GLUCOSE-CAPILLARY: 245 mg/dL — AB (ref 70–99)
GLUCOSE-CAPILLARY: 147 mg/dL — AB (ref 70–99)
GLUCOSE-CAPILLARY: 176 mg/dL — AB (ref 70–99)
Glucose-Capillary: 177 mg/dL — ABNORMAL HIGH (ref 70–99)
Glucose-Capillary: 127 mg/dL — ABNORMAL HIGH (ref 70–99)

## 2018-02-18 LAB — CULTURE, BLOOD (ROUTINE X 2)
CULTURE: NO GROWTH
Culture: NO GROWTH
SPECIAL REQUESTS: ADEQUATE
Special Requests: ADEQUATE

## 2018-02-18 LAB — BASIC METABOLIC PANEL
Anion gap: 8 (ref 5–15)
BUN: 6 mg/dL — AB (ref 8–23)
CO2: 26 mmol/L (ref 22–32)
Calcium: 7.8 mg/dL — ABNORMAL LOW (ref 8.9–10.3)
Chloride: 98 mmol/L (ref 98–111)
Creatinine, Ser: 0.77 mg/dL (ref 0.44–1.00)
GFR calc Af Amer: >60 mL/min (ref >60)
GLUCOSE: 145 mg/dL — AB (ref 70–99)
Potassium: 3.7 mmol/L (ref 3.5–5.1)
Sodium: 132 mmol/L — ABNORMAL LOW (ref 135–145)

## 2018-02-18 MED ORDER — FLUCONAZOLE 100 MG PO TABS
100.0000 mg | ORAL_TABLET | Freq: Every day | ORAL | Status: DC
  Administered 2018-02-18 – 2018-02-19 (×2): 100 mg via ORAL
  Filled 2018-02-18 (×2): qty 1

## 2018-02-18 MED ORDER — INSULIN REGULAR HUMAN 100 UNIT/ML IJ SOLN
3.0000 [IU] | Freq: Three times a day (TID) | INTRAMUSCULAR | Status: DC
  Administered 2018-02-18 – 2018-02-26 (×24): 3 [IU] via SUBCUTANEOUS
  Filled 2018-02-18: qty 0.03

## 2018-02-18 NOTE — Progress Notes (Addendum)
PROGRESS NOTE    Natalie Macdonald  OFB:[id number] DOB: [birth year] DOA: 02/10/2018 PCP: Clifton Custard, MD    Brief Narrative:  62 year old female who presented with altered mental status. She does have significant past medical history for type 2 diabetes mellitus, dyslipidemia, hypertension, hypothyroidism sarcoidosis and chronicleft hemiparesis(wheelchair-bound).Patient was recently discharged from the hospital (01/10/2018)after being treated for septic shock related to sacral decubitus ulcer stage III infection with MRSA.Reported 24 hours of decreased p.o. intake and altered mentation. On her initial physical examination her blood pressure was 61/43, temperature 100.6, heart rate 122 respiratory rate 19, oxygen saturation 98%, pupils were point point bilaterally, she had a hoarse voice, lungs clear to auscultation, heart S1-S2 present and rhythmic, abdomen protuberant with no rebound or guarding, stage III large decubitus ulcer, positive drainage, smaller lesion in the thoracic area also stage III. 131, potassium 4.2, chloride 90, bicarb 24, glucose 191, BUN 41, creatinine 4.2, white count 23.8, hemoglobin 10.3, hematocrit 31.3, platelets 923 urinalysis specific gravity 1.019, white cells and 21-50. CT of the abdomen with no acute abnormalities. Chest x-ray hypoinflated. EKG sinus tachycardia 122 beats per minutes, normal axis, T wave inversions in V4 V5 and V6, left bundle branch block.  Patient was admitted to the hospital working diagnosis ofsepsisdue to decubitus ulcer infection.   Assessment & Plan:   Principal Problem:   Septic shock ([place]) Active Problems:   Diabetes mellitus type 2 with complications ([place])   Left hemiplegia ([place])   Sacral decubitus ulcer, stage III ([place])   AKI (acute kidney injury) ([place])   Toxic encephalopathy  1. Sepsis due to decubitus ulcer MRSAinfection, present on admission.Will continue with IV vancomycin #8. WBC continue to trend down to  11,8. Blood culture with B lactamase bacterioids fragilis, suspected contamination, will hold on Zosyn.  Will plan for MRI today, that patient has agree to have the study. I spoke with patient's husband about the patient's condition and further plan. He is concerned about the extension of the debridement and the amount of tissue that was originally plan to remove, he is concerned about the healing of a larger wound. He agrees in me calling Dr. Windle Guard today for a second wound evaluation. He wants to talk in person to Dr. Windle Guard after the evaluation before consenting for any surgical procedure.   2. Urine infection due yeast(present on admission).Tolerating well antifungal therapy with oral fluconazole#6/8.   3. HTN.On Carvedilol for blood pressure control.    4. Dyspepsia. Her abdominal pain has improved, tolerating po well, will continue antiacid therapy.   5. Chronic back pain. On30 mg morphine bid, gabapentin tid plus as needed oxycodone.  6. Hypothyroid.Onlevothyroxine.  7. T2DM.Total insulin requirements, about 30 units, fasting glucose this am 145, capillary glucose 224, 208, 245, 147, 177. Will continue 10 units of basal insulin with glargine and will add 3 units pre-meal of insulin aspart.    8. Obesity.On nutritional supplements. Patient has requested to liberate her diet.   DVT prophylaxisenoxaparin Code Status:full Family Communication:no family at the bedside  Disposition Plan/ discharge barriers:pending clinical improvement.   Consultants:  General surgery  Procedures:    Antimicrobials:  Vancomycin IV   Zosyn IV Fluconazole  Subjective: Patient's abdominal pain and back pain has improved, but not resolved, no nausea or vomiting, no chest pain or dyspnea, she has refused to be turned q 2 hours. Diet has been liberated per her request to regular.   Objective: Vitals:   02/17/18 7824 02/17/18 1312 02/17/18 2044 02/18/18 2353  BP: 117/71 139/84 132/78 110/61  Pulse: 98 92 98 99  Resp: 18 10 (!) 22 17  Temp: 99.3 F (37.4 C) 99.4 F (37.4 C) 99.2 F (37.3 C) 99.5 F (37.5 C)  TempSrc: Oral Oral Oral Oral  SpO2: 99% 99% 99% 96%  Weight:      Height:        Intake/Output Summary (Last 24 hours) at 02/18/2018 1147 Last data filed at 02/18/2018 0400 Gross per 24 hour  Intake 671.76 ml  Output 1775 ml  Net -1103.24 ml   Filed Weights   02/10/18 0427  Weight: 112 kg    Examination:   General: deconditioned  Neurology: Awake and alert, non focal  E ENT: mild pallor, no icterus, oral mucosa moist Cardiovascular: No JVD. S1-S2 present, rhythmic, no gallops, rubs, or murmurs. No lower extremity edema. Pulmonary: positive breath sounds bilaterally, adequate air movement, no wheezing, rhonchi or rales. Gastrointestinal. Abdomen with, no organomegaly, non tender, no rebound or guarding Skin. No rashes Musculoskeletal: no joint deformities       Data Reviewed: I have personally reviewed following labs and imaging studies  CBC: Recent Labs  Lab 02/14/18 0752 02/15/18 0548 02/16/18 1032 02/17/18 0526 02/18/18 0520  WBC 11.2* 13.5* 11.9* 12.6* 11.8*  NEUTROABS 5.9 8.5* 7.6 7.4 6.2  HGB 8.3* 8.5* 8.4* 8.1* 7.8*  HCT 25.5* 26.1* 25.4* 25.3* 24.7*  MCV 84.7 83.7 83.3 85.5 87.0  PLT 492* 520* 303 465* 371   Basic Metabolic Panel: Recent Labs  Lab 02/12/18 0802  02/14/18 0752 02/15/18 0548 02/16/18 0527 02/17/18 0526 02/18/18 0520  NA 139  --  135 132*  --  133* 132*  K 4.2  --  4.0 3.9  --  3.9 3.7  CL 104  --  100 97*  --  96* 98  CO2 25  --  26 26  --  28 26  GLUCOSE 92  --  147* 183*  --  187* 145*  BUN 10  --  8 7*  --  6* 6*  CREATININE 0.69   < > 0.78 0.75 0.84 0.81 0.77  CALCIUM 8.2*  --  7.7* 7.7*  --  7.8* 7.8*  MG 1.5*  --   --   --   --   --   --    < > = values in this interval not displayed.   GFR: Estimated Creatinine Clearance: 91.4 mL/min (by C-G formula based on  SCr of 0.77 mg/dL). Liver Function Tests: Recent Labs  Lab 02/12/18 0802  AST 27  ALT 27  ALKPHOS 107  BILITOT 0.5  PROT 5.8*  ALBUMIN 1.9*   No results for input(s): LIPASE, AMYLASE in the last 168 hours. No results for input(s): AMMONIA in the last 168 hours. Coagulation Profile: No results for input(s): INR, PROTIME in the last 168 hours. Cardiac Enzymes: No results for input(s): CKTOTAL, CKMB, CKMBINDEX, TROPONINI in the last 168 hours. BNP (last 3 results) No results for input(s): PROBNP in the last 8760 hours. HbA1C: No results for input(s): HGBA1C in the last 72 hours. CBG: Recent Labs  Lab 02/17/18 0725 02/17/18 1157 02/17/18 1623 02/17/18 2120 02/18/18 0744  GLUCAP 199* 224* 208* 245* 147*   Lipid Profile: No results for input(s): CHOL, HDL, LDLCALC, TRIG, CHOLHDL, LDLDIRECT in the last 72 hours. Thyroid Function Tests: No results for input(s): TSH, T4TOTAL, FREET4, T3FREE, THYROIDAB in the last 72 hours. Anemia Panel: No results for input(s): VITAMINB12, FOLATE, FERRITIN, TIBC, IRON, RETICCTPCT in  the last 72 hours.    Radiology Studies: I have reviewed all of the imaging during this hospital visit personally     Scheduled Meds: . amitriptyline  100 mg Oral QHS  . carvedilol  25 mg Oral BID WC  . collagenase   Topical Daily  . enoxaparin (LOVENOX) injection  40 mg Subcutaneous Q24H  . feeding supplement (ENSURE ENLIVE)  237 mL Oral BID BM  . gabapentin  300 mg Oral TID  . insulin glargine  10 Units Subcutaneous Daily  . insulin regular  0-5 Units Subcutaneous QHS  . insulin regular  0-9 Units Subcutaneous TID WC  . levothyroxine  150 mcg Oral QAC breakfast  . morphine  30 mg Oral Q12H  . multivitamin with minerals  1 tablet Oral Daily  . mupirocin cream   Topical Daily  . oxybutynin  15 mg Oral Daily  . pantoprazole  40 mg Oral BID  . polyethylene glycol  17 g Oral BID  . pravastatin  20 mg Oral Daily  . vitamin B-6  250 mg Oral Daily  .  sucralfate  1 g Oral TID WC & HS  . vitamin B-12  1,000 mcg Oral Daily  . vitamin E  400 Units Oral Daily   Continuous Infusions: . sodium chloride    . sodium chloride Stopped (02/17/18 1709)  . fluconazole (DIFLUCAN) IV 100 mg (02/18/18 0904)  . piperacillin-tazobactam (ZOSYN)  IV 3.375 g (02/18/18 0355)  . vancomycin       LOS: 8 days        Tawni Millers, MD [facility] Pager [phone]

## 2018-02-18 NOTE — Progress Notes (Signed)
Wound packing changed during scheduled turning. Wound continues to have copious amount of foul smelling purulent drainage. Noted deterioration of peri-wound secondary to constant moisture from wound drainage and urinary incontinence. Will continue prn dressing changes.

## 2018-02-18 NOTE — Care Management Important Message (Signed)
Important Message  Patient Details  Name: Natalie Macdonald MRN: [id number] Date of Birth: September 21, 1955   Medicare Important Message Given:  Yes    Kerin Salen 02/18/2018, 12:57 [place] Message  Patient Details  Name: Natalie Macdonald MRN: [id number] Date of Birth: [birth year]   Medicare Important Message Given:  Yes    Kerin Salen 02/18/2018, 12:57 PM

## 2018-02-18 NOTE — Progress Notes (Signed)
CC:  Admitted with AMS  Subjective: Requested to reevaluate the wound by Dr. Cathlean Sauer, with the permission of the patient's husband.   Objective: Vital signs in last 24 hours: Temp:  [99.2 F (37.3 C)-100 F (37.8 C)] 100 F (37.8 C) (08/19 1235) Pulse Rate:  [92-99] 98 (08/19 1235) Resp:  [10-22] 17 (08/19 0412) BP: (110-139)/(61-84) 122/65 (08/19 1235) SpO2:  [96 %-99 %] 98 % (08/19 1235) Last BM Date: 02/17/18 846 PO 1171 IV 1775 urine No Bm recorded TM 100  VSS Na 132, glucose 145,  Creatinine 0.77 WBC 11.8 H/H 7.8/24.7;  Platelets 397K  Intake/Output from previous day: 08/18 0701 - 08/19 0700 In: 1171.8 [P.O.:846; I.V.:178.7; IV Piggyback:147.1] Out: 1775 [Urine:1775] Intake/Output this shift: No intake/output data recorded.  She is alert and cooperative. She is oriented x 4.  Unlabored respirations. Abdomen soft. WICK in place.  Sacral wound with extensive necrotic tissue and undermining. Periosteum of the coccyx is exposed.   Lab Results:  Recent Labs    02/17/18 0526 02/18/18 0520  WBC 12.6* 11.8*  HGB 8.1* 7.8*  HCT 25.3* 24.7*  PLT 465* 397    BMET Recent Labs    02/17/18 0526 02/18/18 0520  NA 133* 132*  K 3.9 3.7  CL 96* 98  CO2 28 26  GLUCOSE 187* 145*  BUN 6* 6*  CREATININE 0.81 0.77  CALCIUM 7.8* 7.8*   PT/INR No results for input(s): LABPROT, INR in the last 72 hours.  Recent Labs  Lab 02/12/18 0802  AST 27  ALT 27  ALKPHOS 107  BILITOT 0.5  PROT 5.8*  ALBUMIN 1.9*     Lipase  No results found for: LIPASE   Medications: . amitriptyline  100 mg Oral QHS  . carvedilol  25 mg Oral BID WC  . collagenase   Topical Daily  . enoxaparin (LOVENOX) injection  40 mg Subcutaneous Q24H  . feeding supplement (ENSURE ENLIVE)  237 mL Oral BID BM  . gabapentin  300 mg Oral TID  . insulin aspart  3 Units Subcutaneous TID WC  . insulin glargine  10 Units Subcutaneous Daily  . insulin regular  0-5 Units Subcutaneous QHS  .  insulin regular  0-9 Units Subcutaneous TID WC  . levothyroxine  150 mcg Oral QAC breakfast  . morphine  30 mg Oral Q12H  . multivitamin with minerals  1 tablet Oral Daily  . mupirocin cream   Topical Daily  . oxybutynin  15 mg Oral Daily  . pantoprazole  40 mg Oral BID  . polyethylene glycol  17 g Oral BID  . pravastatin  20 mg Oral Daily  . vitamin B-6  250 mg Oral Daily  . sucralfate  1 g Oral TID WC & HS  . vitamin B-12  1,000 mcg Oral Daily  . vitamin E  400 Units Oral Daily    Assessment/Plan Acute kidney injury-improved  Anemia-most likely from chronic illness/rehydration, stable Hx of Sarcoidosis Hx of CVA Nephrolithiasis- non obstructing Hypothyroid Hypokalemia-resolved.  Septic shock with large sacral decubitus-MRSAPositive7/2019improving Staphylococcus species- positive blood culture Yeast UTI Multiple skin ulcers Left hemiplegia, with history of cervical decompression/discectomy 2018 Bed-chairbound  FEN: IV fluids/NPO ID: Zosyn stopped; Vancomycin 8/11 =>>, Diflucan 8/12 =>> DVT: Lovenox Follow up: TBD  I spoke with her husband, Vinia Jemmott, by phone immediately following my evaluation. I am in complete agreement with the recommendations outlined by Dr. Harlow Asa last week in his note dated 02/14/18 and reiterated those points to Mr.  Deuser. Specifically, the use of sharp debridement of necrotic tissue as there is extensive necrosis and now mild osteomyelitis noted on MRI; that with her non-ambulatory status there is little chance of complete healing no matter what we do but the aim is to improve control of the source of her sepsis. He remains unsatisfied with this recommendation and made it clear that he DOES NOT CONSENT to any procedure on his wife at this time.   Please call us if there is a change in status. Could consider hydrotherapy to be a little more aggressive if patient and husband are in agreement. Will follow peripherally.     LOS: 8  days   Clovis Riley MD

## 2018-02-18 NOTE — Progress Notes (Signed)
Nutrition Follow-up  DOCUMENTATION CODES:   Morbid obesity  INTERVENTION:   - Continue Ensure Enlive po BID, each supplement provides 350 kcal and 20 grams of protein  - Continue Magic cup once daily with meals, each supplement provides 290 kcal and 9 grams of protein  - Once septic shock resolves, 1 packet Juven BID, each packet provides 80 calories, 8 grams of carbohydrate, and 14 grams of amino acids; supplement contains CaHMB, glutamine, and arginine, to promote wound healing  - Continue to encourage adequate PO intake  NUTRITION DIAGNOSIS:   Increased nutrient needs related to wound healing, acute illness as evidenced by estimated needs.  Ongoing, being addressed via oral nutrition supplements  GOAL:   Patient will meet greater than or equal to 90% of their needs  Progressing  MONITOR:   PO intake, Supplement acceptance, Weight trends, Labs, Skin  REASON FOR ASSESSMENT:   Consult Assessment of nutrition requirement/status  ASSESSMENT:   62 y.o. 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 She presented with altered mental status found to be in septic shock.  Noted pt continues to decline MRI and family requests second opinion regarding decubitus ulcer.  Spoke with pt at bedside who reports that her appetite is "getting better." RD noted 50% completed lunch meal tray at bedside at time of visit. Pt states that she couldn't eat all of her lunch because people kept coming in to talk to her.  Pt states that the Ensure Enlive sometimes makes her nauseous but states she will try to drink it. Discussed the importance of adequate kcal and protein intake in wound healing. Pt expressed understanding.  Pt willing to try Juven once septic shock has resolved.  Meal Completion: 50-100%  Medications reviewed and include: Ensure Enlive BID - pt refusing,  10 units Lantus daily, sliding scale Novolin, 150 mcg levothyroxine daily, MVI with minerals daily, 40 mg Protonix BID, Miralax BID, 250 mg vitamin B-6 daily, 1,000 mcg vitamin B-12 daily, 400 units vitamin E daily, IV antibioitics  Labs reviewed: sodium 132 (L), BUN 6 (L), hemoglobin 7.8 (L), HCT 24.7 (L) CBG's: 147, 245, 208, 224 x 24 hours  UOP: 1775 ml x 24 hours I/O's: +7.5 L since admission  Diet Order:   Diet Order            Diet regular Room service appropriate? Yes; Fluid consistency: Thin  Diet effective now              EDUCATION NEEDS:   No education needs have been identified at this time  Skin:  Skin Assessment: Skin Integrity Issues: DTI: L heel Unstageable: full thickness to sacrum, along vertebral column, and to L 2nd toe Other: non-pressure wound to L thigh  Last BM:  02/17/18  Height:   Ht Readings from Last 1 Encounters:  02/10/18 5\' 6"  (4.920 m)    Weight:   Wt Readings from Last 1 Encounters:  02/10/18 112 kg    Ideal Body Weight:  59.09 kg  BMI:  Body mass index is 39.85 kg/m.  Estimated Nutritional Needs:   Kcal:  1007-1219 (20-22 kcal/kg)  Protein:  125-135 grams  Fluid:  >/= 2 L/day    Gaynell Face, MS, RD, LDN Pager: [phone] Weekend/After Hours: [phone]

## 2018-02-19 LAB — CBC WITH DIFFERENTIAL/PLATELET
BASOS ABS: 0 10*3/uL (ref 0.0–0.1)
Basophils Relative: 0 %
EOS ABS: 0.1 10*3/uL (ref 0.0–0.7)
EOS PCT: 1 %
HCT: 26 % — ABNORMAL LOW (ref 36.0–46.0)
Hemoglobin: 8.4 g/dL — ABNORMAL LOW (ref 12.0–15.0)
LYMPHS ABS: 3.6 10*3/uL (ref 0.7–4.0)
LYMPHS PCT: 25 %
MCH: 27.9 pg (ref 26.0–34.0)
MCHC: 32.3 g/dL (ref 30.0–36.0)
MCV: 86.4 fL (ref 78.0–100.0)
MONO ABS: 1.2 10*3/uL — AB (ref 0.1–1.0)
Monocytes Relative: 8 %
Neutro Abs: 9.5 10*3/uL — ABNORMAL HIGH (ref 1.7–7.7)
Neutrophils Relative %: 66 %
PLATELETS: 466 10*3/uL — AB (ref 150–400)
RBC: 3.01 MIL/uL — AB (ref 3.87–5.11)
RDW: 17.4 % — AB (ref 11.5–15.5)
WBC: 14.5 10*3/uL — AB (ref 4.0–10.5)

## 2018-02-19 LAB — GLUCOSE, CAPILLARY
GLUCOSE-CAPILLARY: 200 mg/dL — AB (ref 70–99)
Glucose-Capillary: 139 mg/dL — ABNORMAL HIGH (ref 70–99)
Glucose-Capillary: 119 mg/dL — ABNORMAL HIGH (ref 70–99)

## 2018-02-19 LAB — BASIC METABOLIC PANEL
Anion gap: 9 (ref 5–15)
BUN: 8 mg/dL (ref 8–23)
CO2: 28 mmol/L (ref 22–32)
Calcium: 8 mg/dL — ABNORMAL LOW (ref 8.9–10.3)
Chloride: 98 mmol/L (ref 98–111)
Creatinine, Ser: 0.85 mg/dL (ref 0.44–1.00)
GFR calc Af Amer: >60 mL/min (ref >60)
GLUCOSE: 173 mg/dL — AB (ref 70–99)
POTASSIUM: 3.8 mmol/L (ref 3.5–5.1)
SODIUM: 135 mmol/L (ref 135–145)

## 2018-02-19 MED ORDER — METRONIDAZOLE 500 MG PO TABS
500.0000 mg | ORAL_TABLET | Freq: Three times a day (TID) | ORAL | Status: DC
  Administered 2018-02-19 – 2018-02-26 (×22): 500 mg via ORAL
  Filled 2018-02-19 (×23): qty 1

## 2018-02-19 MED ORDER — DOXYCYCLINE HYCLATE 100 MG PO TABS
100.0000 mg | ORAL_TABLET | Freq: Two times a day (BID) | ORAL | Status: DC
  Administered 2018-02-19 – 2018-02-26 (×15): 100 mg via ORAL
  Filled 2018-02-19 (×15): qty 1

## 2018-02-19 NOTE — Progress Notes (Signed)
PROGRESS NOTE    Natalie Macdonald  YIF:[id number] DOB: Feb 14, 1956 DOA: 02/10/2018 PCP: Clifton Custard, MD    Brief Narrative:  62 year old female who presented with altered mental status. She does have significant past medical history for type 2 diabetes mellitus, dyslipidemia, hypertension, hypothyroidism sarcoidosis and chronicleft hemiparesis(wheelchair-bound).Patient was recently discharged from the hospital (01/10/2018)after being treated for septic shock related to sacral decubitus ulcer stage III infection with MRSA.Reported 24 hours of decreased p.o. intake and altered mentation. On her initial physical examination her blood pressure was 61/43, temperature 100.6, heart rate 122 respiratory rate 19, oxygen saturation 98%, pupils were point point bilaterally, she had a hoarse voice, lungs clear to auscultation, heart S1-S2 present and rhythmic, abdomen protuberant with no rebound or guarding, stage III large decubitus ulcer, positive drainage, smaller lesion in the thoracic area also stage III. 131, potassium 4.2, chloride 90, bicarb 24, glucose 191, BUN 41, creatinine 4.2, white count 23.8, hemoglobin 10.3, hematocrit 31.3, platelets 923 urinalysis specific gravity 1.019, white cells and 21-50. CT of the abdomen with no acute abnormalities. Chest x-ray hypoinflated. EKG sinus tachycardia 122 beats per minutes, normal axis, T wave inversions in V4 V5 and V6, left bundle branch block.  Patient was admitted to the hospital working diagnosis ofsepsisdue to decubitus ulcer infection.   Assessment & Plan:   Principal Problem:   Septic shock ([place]) Active Problems:   Diabetes mellitus type 2 with complications ([place])   Left hemiplegia ([place])   Sacral decubitus ulcer, stage III ([place])   AKI (acute kidney injury) ([place])   Toxic encephalopathy   1. Sepsis due to decubitus ulcer MRSAinfection, present on admission.Wound continue to be infected, MRI with possible lower coccyx mild  osteomyelitis. Patient had about 8 days of IV antibiotics, will change to oral doxycyline and metronidazole, trial of po antibiotic therapy. Wound with persistent drainage, odor, pain and necrotic tissue. Will need debridement. I spoke with Mr. Hentges, about patient's condition and surgery recommendations. He would like an independent evaluation before consenting for debridement. I will check with hospital administration about other resources we have in the hospital.    2. Urine infection due yeast(present on admission).Completed antibiotic therapy.   3. HTN.Continue with  Carvedilol for blood pressure control.   4. Dyspepsia.  Will continue antiacid therapy with pantoprazole and sucralfate.   5. Chronic back pain. Continue pain control with30 mg morphine bid, gabapentin tid and as neededoxycodone.  6. Hypothyroid.Continuelevothyroxine.  7. T2DM with uncontrolled hyperglycemia. Continue basal insulin with 10 units, plus pre-meal 3 units, plus sliding scale for glucose cover and monitoring.  Capillary glucose 200, 139, 119.   8. Obesity.Continue nutritional supplements.   DVT prophylaxisenoxaparin Code Status:full Family Communication:no family at the bedside  Disposition Plan/ discharge barriers:pending clinical improvement.   Consultants:  General surgery  Procedures:    Antimicrobials: Doxycyline  Metronidazole   Subjective: Patient not feeling well this am, no nausea or vomiting, no chest pain or dyspnea. Her wound continue to have copious drainage, and bad odor. She continue to decline debridement.   Objective: Vitals:   02/18/18 0412 02/18/18 1235 02/18/18 2115 02/19/18 0514  BP: 110/61 122/65 104/63 111/74  Pulse: 99 98 100 (!) 104  Resp: 17  18 18   Temp: 99.5 F (37.5 C) 100 F (37.8 C) 100.1 F (37.8 C) 99.5 F (37.5 C)  TempSrc: Oral Oral Oral Oral  SpO2: 96% 98% 96% 98%  Weight:      Height:        Intake/Output  Summary (Last 24 hours) at 02/19/2018 1031 Last data filed at 02/19/2018 0700 Gross per 24 hour  Intake 880 ml  Output 600 ml  Net 280 ml   Filed Weights   02/10/18 0427  Weight: 112 kg    Examination:   General: deconditioned  Neurology: Awake and alert, non focal  E ENT: mild pallor, no icterus, oral mucosa moist Cardiovascular: No JVD. S1-S2 present, rhythmic, no gallops, rubs, or murmurs. Non pitting lower extremity edema. Pulmonary: positive breath sounds bilaterally, adequate air movement, no wheezing, rhonchi or rales. Gastrointestinal. Abdomen protuberant, with no organomegaly, non tender, no rebound or guarding Skin. Large pressure decubitus ulcer stage 4.  Musculoskeletal: no joint deformities       Data Reviewed: I have personally reviewed following labs and imaging studies  CBC: Recent Labs  Lab 02/15/18 0548 02/16/18 1032 02/17/18 0526 02/18/18 0520 02/19/18 0558  WBC 13.5* 11.9* 12.6* 11.8* 14.5*  NEUTROABS 8.5* 7.6 7.4 6.2 9.5*  HGB 8.5* 8.4* 8.1* 7.8* 8.4*  HCT 26.1* 25.4* 25.3* 24.7* 26.0*  MCV 83.7 83.3 85.5 87.0 86.4  PLT 520* 303 465* 397 542*   Basic Metabolic Panel: Recent Labs  Lab 02/14/18 0752 02/15/18 0548 02/16/18 0527 02/17/18 0526 02/18/18 0520 02/19/18 0558  NA 135 132*  --  133* 132* 135  K 4.0 3.9  --  3.9 3.7 3.8  CL 100 97*  --  96* 98 98  CO2 26 26  --  28 26 28   GLUCOSE 147* 183*  --  187* 145* 173*  BUN 8 7*  --  6* 6* 8  CREATININE 0.78 0.75 0.84 0.81 0.77 0.85  CALCIUM 7.7* 7.7*  --  7.8* 7.8* 8.0*   GFR: Estimated Creatinine Clearance: 86 mL/min (by C-G formula based on SCr of 0.85 mg/dL). Liver Function Tests: No results for input(s): AST, ALT, ALKPHOS, BILITOT, PROT, ALBUMIN in the last 168 hours. No results for input(s): LIPASE, AMYLASE in the last 168 hours. No results for input(s): AMMONIA in the last 168 hours. Coagulation Profile: No results for input(s): INR, PROTIME in the last 168 hours. Cardiac  Enzymes: No results for input(s): CKTOTAL, CKMB, CKMBINDEX, TROPONINI in the last 168 hours. BNP (last 3 results) No results for input(s): PROBNP in the last 8760 hours. HbA1C: No results for input(s): HGBA1C in the last 72 hours. CBG: Recent Labs  Lab 02/18/18 0744 02/18/18 1224 02/18/18 1726 02/18/18 2243 02/19/18 0728  GLUCAP 147* 177* 176* 127* 200*   Lipid Profile: No results for input(s): CHOL, HDL, LDLCALC, TRIG, CHOLHDL, LDLDIRECT in the last 72 hours. Thyroid Function Tests: No results for input(s): TSH, T4TOTAL, FREET4, T3FREE, THYROIDAB in the last 72 hours. Anemia Panel: No results for input(s): VITAMINB12, FOLATE, FERRITIN, TIBC, IRON, RETICCTPCT in the last 72 hours.    Radiology Studies: I have reviewed all of the imaging during this hospital visit personally     Scheduled Meds: . amitriptyline  100 mg Oral QHS  . carvedilol  25 mg Oral BID WC  . collagenase   Topical Daily  . enoxaparin (LOVENOX) injection  40 mg Subcutaneous Q24H  . feeding supplement (ENSURE ENLIVE)  237 mL Oral BID BM  . fluconazole  100 mg Oral Daily  . gabapentin  300 mg Oral TID  . insulin glargine  10 Units Subcutaneous Daily  . insulin regular  0-5 Units Subcutaneous QHS  . insulin regular  0-9 Units Subcutaneous TID WC  . insulin regular  3 Units Subcutaneous TID WC  .  levothyroxine  150 mcg Oral QAC breakfast  . morphine  30 mg Oral Q12H  . multivitamin with minerals  1 tablet Oral Daily  . mupirocin cream   Topical Daily  . oxybutynin  15 mg Oral Daily  . pantoprazole  40 mg Oral BID  . polyethylene glycol  17 g Oral BID  . pravastatin  20 mg Oral Daily  . vitamin B-6  250 mg Oral Daily  . sucralfate  1 g Oral TID WC & HS  . vitamin B-12  1,000 mcg Oral Daily  . vitamin E  400 Units Oral Daily   Continuous Infusions: . sodium chloride    . sodium chloride Stopped (02/17/18 1709)  . vancomycin 1,500 mg (02/18/18 1223)     LOS: 9 days        Mauricio Gerome Apley, MD [facility] Pager [phone]

## 2018-02-20 LAB — BASIC METABOLIC PANEL
ANION GAP: 12 (ref 5–15)
BUN: 8 mg/dL (ref 8–23)
CHLORIDE: 98 mmol/L (ref 98–111)
CO2: 25 mmol/L (ref 22–32)
Calcium: 8.3 mg/dL — ABNORMAL LOW (ref 8.9–10.3)
Creatinine, Ser: 0.85 mg/dL (ref 0.44–1.00)
GFR calc Af Amer: >60 mL/min (ref >60)
GFR calc non Af Amer: >60 mL/min (ref >60)
GLUCOSE: 161 mg/dL — AB (ref 70–99)
POTASSIUM: 4.2 mmol/L (ref 3.5–5.1)
Sodium: 135 mmol/L (ref 135–145)

## 2018-02-20 LAB — CBC WITH DIFFERENTIAL/PLATELET
BASOS ABS: 0 10*3/uL (ref 0.0–0.1)
Basophils Relative: 0 %
EOS PCT: 1 %
Eosinophils Absolute: 0.1 10*3/uL (ref 0.0–0.7)
HCT: 25.9 % — ABNORMAL LOW (ref 36.0–46.0)
HEMOGLOBIN: 8.2 g/dL — AB (ref 12.0–15.0)
LYMPHS ABS: 4.1 10*3/uL — AB (ref 0.7–4.0)
LYMPHS PCT: 35 %
MCH: 27.9 pg (ref 26.0–34.0)
MCHC: 31.7 g/dL (ref 30.0–36.0)
MCV: 88.1 fL (ref 78.0–100.0)
Monocytes Absolute: 1 10*3/uL (ref 0.1–1.0)
Monocytes Relative: 8 %
NEUTROS ABS: 6.4 10*3/uL (ref 1.7–7.7)
NEUTROS PCT: 56 %
Platelets: 489 10*3/uL — ABNORMAL HIGH (ref 150–400)
RBC: 2.94 MIL/uL — AB (ref 3.87–5.11)
RDW: 17.7 % — ABNORMAL HIGH (ref 11.5–15.5)
WBC: 11.6 10*3/uL — ABNORMAL HIGH (ref 4.0–10.5)

## 2018-02-20 LAB — GLUCOSE, CAPILLARY
GLUCOSE-CAPILLARY: 185 mg/dL — AB (ref 70–99)
GLUCOSE-CAPILLARY: 110 mg/dL — AB (ref 70–99)
GLUCOSE-CAPILLARY: 141 mg/dL — AB (ref 70–99)
Glucose-Capillary: 192 mg/dL — ABNORMAL HIGH (ref 70–99)

## 2018-02-20 NOTE — Progress Notes (Addendum)
PROGRESS NOTE    Natalie Macdonald  IRW:[id number] DOB: 11-20-1955 DOA: 02/10/2018 PCP: Clifton Custard, MD   Brief Narrative: Natalie Macdonald is a 62 y.o. 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 She presented with altered mental status found to have septic shock. MRI significant for osteomyelitis. Patient declining debridement of her necrotic sacral wound.   Assessment & Plan:   Principal Problem:   Septic shock ([place]) Active Problems:   Diabetes mellitus type 2 with complications ([place])   Left hemiplegia ([place])   Sacral decubitus ulcer, stage III ([facility])   AKI (acute kidney injury) ([place])   Toxic encephalopathy   Septic shock Patient received 3.5 L in the ED with improvement in blood pressure. Blood pressure down again now. Associated kidney injury. Source is wound vs urinary. Leukocytosis. Patient received 8 L of IV fluids. Repeat lactic acid is normal. Urine culture significant for yeast and blood cultures significant for Bacteroides fragilis and coagulase negative staph -Continue antibiotics  Bacteremia In setting of sacral ulcer. -Continue Doxycycline/Flagyl (plan for 6 weeks in setting of osteomyelitis)  Delirium In setting of septic shock. Resolved.  Pressure ulcers Multiple; unstageable sacral, unstageable left posterior shoulder, unstageable left outer thigh, left 2nd toe full thickness wound, left heel deep tissue injury. MRI significant for mild osteomyelitis. General surgery recommending debridement for which the patient refuses.  Acute kidney injury In setting of septic shock. Patient with good urine output. Resolved.  Diabetes mellitus, type 2 Uncontrolled with hyperglycemia at home and hypoglycemia during admission. Hypoglycemia improved. -Continue Lantus 10 units -Continue SSI (qAC/HS)  Hypokalemia Resolved.  Anemia Chronic.  Stable.  Left hemiplegia Chronic. Baseline.  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   DVT prophylaxis: Lovenox Code Status:   Code Status: Full Code Family Communication: Husband at bedside Disposition Plan: Transfer to telemetry. Discharge when medically stable   Consultants:   General surgery  Procedures:   None  Antimicrobials:  Vancomycin  Zosyn    Subjective: No concerns. No problems overnight.  Objective: Vitals:   02/19/18 2253 02/20/18 0529 02/20/18 0850 02/20/18 1521  BP: 117/60 (!) 123/46 122/74 (!) 109/57  Pulse: 91 93 92 86  Resp:  18  20  Temp: 98.7 F (37.1 C) 98.5 F (36.9 C)    TempSrc: Oral Oral    SpO2: 95% 99%  97%  Weight:      Height:        Intake/Output Summary (Last 24 hours) at 02/20/2018 1729 Last data filed at 02/20/2018 1500 Gross per 24 hour  Intake 438.53 ml  Output 350 ml  Net 88.53 ml   Filed Weights   02/10/18 0427  Weight: 112 kg    Examination:  General exam: Appears calm and comfortable Respiratory system: Clear to auscultation. Respiratory effort normal. Cardiovascular system: S1 & S2 heard, RRR. No murmurs, rubs, gallops or clicks. Gastrointestinal system: Abdomen is distended, soft and nontender. No organomegaly or masses felt. Normal bowel sounds heard. Central nervous system: Alert and oriented. No focal neurological deficits. Extremities: LE edema. No calf tenderness Skin: No cyanosis. No rashes Psychiatry: Judgement and insight appear normal. Mood & affect appropriate.    Data  Reviewed: I have personally reviewed following labs and imaging studies  CBC: Recent Labs  Lab 02/16/18 1032 02/17/18 0526  02/18/18 0520 02/19/18 0558 02/20/18 0610  WBC 11.9* 12.6* 11.8* 14.5* 11.6*  NEUTROABS 7.6 7.4 6.2 9.5* 6.4  HGB 8.4* 8.1* 7.8* 8.4* 8.2*  HCT 25.4* 25.3* 24.7* 26.0* 25.9*  MCV 83.3 85.5 87.0 86.4 88.1  PLT 303 465* 397 466* 409*   Basic Metabolic Panel: Recent Labs  Lab 02/15/18 0548 02/16/18 0527 02/17/18 0526 02/18/18 0520 02/19/18 0558 02/20/18 0610  NA 132*  --  133* 132* 135 135  K 3.9  --  3.9 3.7 3.8 4.2  CL 97*  --  96* 98 98 98  CO2 26  --  28 26 28 25   GLUCOSE 183*  --  187* 145* 173* 161*  BUN 7*  --  6* 6* 8 8  CREATININE 0.75 0.84 0.81 0.77 0.85 0.85  CALCIUM 7.7*  --  7.8* 7.8* 8.0* 8.3*   GFR: Estimated Creatinine Clearance: 86 mL/min (by C-G formula based on SCr of 0.85 mg/dL). Liver Function Tests: No results for input(s): AST, ALT, ALKPHOS, BILITOT, PROT, ALBUMIN in the last 168 hours. No results for input(s): LIPASE, AMYLASE in the last 168 hours. No results for input(s): AMMONIA in the last 168 hours. Coagulation Profile: No results for input(s): INR, PROTIME in the last 168 hours. Cardiac Enzymes: No results for input(s): CKTOTAL, CKMB, CKMBINDEX, TROPONINI in the last 168 hours. BNP (last 3 results) No results for input(s): PROBNP in the last 8760 hours. HbA1C: No results for input(s): HGBA1C in the last 72 hours. CBG: Recent Labs  Lab 02/19/18 1151 02/19/18 1632 02/20/18 0731 02/20/18 1157 02/20/18 1649  GLUCAP 139* 119* 185* 192* 110*   Lipid Profile: No results for input(s): CHOL, HDL, LDLCALC, TRIG, CHOLHDL, LDLDIRECT in the last 72 hours. Thyroid Function Tests: No results for input(s): TSH, T4TOTAL, FREET4, T3FREE, THYROIDAB in the last 72 hours. Anemia Panel: No results for input(s): VITAMINB12, FOLATE, FERRITIN, TIBC, IRON, RETICCTPCT in the last 72 hours. Sepsis Labs: No results for input(s): PROCALCITON, LATICACIDVEN in the last 168 hours.  Recent Results (from the past 240 hour(s))  MRSA PCR Screening     Status:  Abnormal   Collection Time: 02/11/18 10:42 AM  Result Value Ref Range Status   MRSA by PCR POSITIVE (A) NEGATIVE Final    Comment:        The GeneXpert MRSA Assay (FDA approved for NASAL specimens only), is one component of a comprehensive MRSA colonization surveillance program. It is not intended to diagnose MRSA infection nor to guide or monitor treatment for MRSA infections. RESULT CALLED TO, READ BACK BY AND VERIFIED WITHRuben Reason [id number] @ [address] Performed at [facility] [address], [place], [place] [address]   Culture, blood (routine x 2)     Status: None   Collection Time: 02/13/18  3:18 PM  Result Value Ref Range Status   Specimen Description   Final    BLOOD LEFT ANTECUBITAL Performed at [facility] [address] [place], [place] [address]    Special Requests   Final    BOTTLES DRAWN AEROBIC ONLY Blood Culture adequate volume Performed at [facility] [address] [place],  [address]    Culture   Final    NO GROWTH 5 DAYS Performed at [facility], [place] [address], [place],  [address]    Report  Status 02/18/2018 FINAL  Final  Culture, blood (routine x 2)     Status: None   Collection Time: 02/13/18  3:19 PM  Result Value Ref Range Status   Specimen Description   Final    BLOOD LEFT FOREARM Performed at [facility] [address], [place], [place] [address]    Special Requests   Final    BOTTLES DRAWN AEROBIC ONLY Blood Culture adequate volume Performed at [facility] [address], [place], [place] [address]    Culture   Final    NO GROWTH 5 DAYS Performed at [facility], [place] [address] [place],  [address]    Report Status 02/18/2018 FINAL  Final         Radiology Studies: No results found.      Scheduled Meds: . amitriptyline  100 mg Oral QHS  . carvedilol  25 mg Oral BID WC  .  collagenase   Topical Daily  . doxycycline  100 mg Oral Q12H  . enoxaparin (LOVENOX) injection  40 mg Subcutaneous Q24H  . feeding supplement (ENSURE ENLIVE)  237 mL Oral BID BM  . gabapentin  300 mg Oral TID  . insulin glargine  10 Units Subcutaneous Daily  . insulin regular  0-5 Units Subcutaneous QHS  . insulin regular  0-9 Units Subcutaneous TID WC  . insulin regular  3 Units Subcutaneous TID WC  . levothyroxine  150 mcg Oral QAC breakfast  . metroNIDAZOLE  500 mg Oral Q8H  . morphine  30 mg Oral Q12H  . multivitamin with minerals  1 tablet Oral Daily  . mupirocin cream   Topical Daily  . oxybutynin  15 mg Oral Daily  . pantoprazole  40 mg Oral BID  . polyethylene glycol  17 g Oral BID  . pravastatin  20 mg Oral Daily  . vitamin B-6  250 mg Oral Daily  . sucralfate  1 g Oral TID WC & HS  . vitamin B-12  1,000 mcg Oral Daily  . vitamin E  400 Units Oral Daily   Continuous Infusions: . sodium chloride    . sodium chloride 10 mL/hr at 02/20/18 1500     LOS: 10 days     Cordelia Poche, MD [facility] 02/20/2018, 5:29 PM Pager: [phone]  If 7PM-7AM, please contact night-coverage [email or web address] 02/20/2018, 5:29 PM

## 2018-02-20 NOTE — Progress Notes (Addendum)
Patient's husband stated to this RN that he is "not happy" because patient was "dropped" in MRI yesterday. Husband also stated pt now has a new bruise to her mid lower back and he stated "I want that documented. It better be documented." Patient's back was assessed- charge RN was also called to bedside to assess. No bruise was noted. Only extensive pressure ulcer wound was noted. Pt also complained that when she was transferred from hospital bed to this air mattress bed she is currently lying in, a "nurse fell on my leg." Pt states the nurse lied on her left knee, and herb knee is now painful and swollen. Pt states this happened days ago after admission. Left knee does appear slightly more swollen than right knee. MD Nettey made aware of complaints and of patient's new pain. Will speak to husband and patient. DD of this floor also to be made aware. Husband states he will notify "risk management" of his concerns. Patient currently resting in bed, eating supper. Has been repositioned as closely to every 2 hours as possible this shift. All dressings were changed this AM and dressing care done as ordered to each wound.

## 2018-02-21 LAB — GLUCOSE, CAPILLARY
GLUCOSE-CAPILLARY: 204 mg/dL — AB (ref 70–99)
Glucose-Capillary: 222 mg/dL — ABNORMAL HIGH (ref 70–99)
Glucose-Capillary: 177 mg/dL — ABNORMAL HIGH (ref 70–99)
Glucose-Capillary: 155 mg/dL — ABNORMAL HIGH (ref 70–99)

## 2018-02-21 NOTE — Care Management Note (Signed)
Case Management Note  Patient Details  Name: Marijo Quizon MRN: [id number] Date of Birth: [birth year]  Subjective/Objective:Attending has discussed medical treatment currently, recc-noted plastics recc-all specialsits agree to debridement of sacral wound, leave open,currently not a candidate for a flap closure. Patient's concern is leaving the wound open, want a 2nd opinion-she defers to spouse-attending will discuss options of transfer to another facility if accepted-acute to acute transfer, CM informed patient another option to d/c home w/home care if transfer to another hospital if not accepted-patient was receptive. [facility] is already following.                   Action/Plan:d/c plan acute to acute transfer   Expected Discharge Date:  (unknown)               Expected Discharge Plan:  Acute to Acute Transfer  In-House Referral:     Discharge planning Services  CM Consult  Post Acute Care Choice:    Choice offered to:     DME Arranged:  [facility] DME Agency:  [place]:    [place] Agency:     Status of Service:  In process, will continue to follow  If discussed at Long Length of Stay Meetings, dates discussed:    Additional Comments:  Dessa Phi, RN 02/21/2018, 12:52 PM

## 2018-02-21 NOTE — Care Management Important Message (Addendum)
Important Message  Patient Details IM Letter given to Kathy/Case Manager to present to the Patient Name: Natalie Macdonald MRN: [id number] Date of Birth: [birth year]   Medicare Important Message Given:  Yes    Kerin Salen 02/21/2018, 12:08 [place] Message  Patient Details  Name: Natalie Macdonald MRN: [id number] Date of Birth: 19-Sep-1955   Medicare Important Message Given:  Yes    Kerin Salen 02/21/2018, 12:08 PM

## 2018-02-21 NOTE — Progress Notes (Signed)
PT Cancellation Note  Patient Details Name: Natalie Macdonald MRN: [id number] DOB: 1956/07/02   Cancelled Treatment:    Reason Eval/Treat Not Completed: Attempted PT eval-pt declined participation on today due to not feeling well. Will check back another day.    Weston Anna, MPT Pager: [phone]

## 2018-02-21 NOTE — Progress Notes (Signed)
PROGRESS NOTE    Natalie Macdonald  ZOX:[id number] DOB: 1955-09-22 DOA: 02/10/2018 PCP: Clifton Custard, MD   Brief Narrative: Natalie Macdonald is a 62 y.o. 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 She presented with altered mental status found to have septic shock. MRI significant for osteomyelitis. Patient declining debridement of her necrotic sacral wound.   Assessment & Plan:   Principal Problem:   Septic shock ([place]) Active Problems:   Diabetes mellitus type 2 with complications ([place])   Left hemiplegia ([place])   Sacral decubitus ulcer, stage III (H[facility])   AKI (acute kidney injury) ([place])   Toxic encephalopathy   Septic shock Patient received 3.5 L in the ED with improvement in blood pressure. Blood pressure down again now. Associated kidney injury. Source is wound vs urinary. Leukocytosis. Patient received 8 L of IV fluids. Repeat lactic acid is normal. Urine culture significant for yeast and blood cultures significant for Bacteroides fragilis and coagulase negative staph -Continue antibiotics  Bacteremia Bacteroides fragilis. In setting of sacral ulcer. -Continue Doxycycline/Flagyl (plan for 6 weeks in setting of osteomyelitis)  Delirium In setting of septic shock. Resolved.  Pressure ulcers Multiple; unstageable sacral, unstageable left posterior shoulder, unstageable left outer thigh, left 2nd toe full thickness wound, left heel deep tissue injury. MRI significant for mild osteomyelitis. General surgery recommending debridement for which the patient refuses. Consulted plastic surgery who agrees with general surgery management plan. -PT eval  Acute kidney injury In setting of septic shock. Patient with good urine output. Resolved.  Diabetes mellitus, type 2 Uncontrolled with hyperglycemia at home and hypoglycemia during admission. Hypoglycemia  improved. -Continue Lantus 10 units -Continue SSI (qAC/HS)  Hypokalemia Resolved.  Anemia Chronic. Stable.  Left hemiplegia Chronic. Baseline.  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   DVT prophylaxis: Lovenox Code Status:   Code Status: Full Code Family Communication: Husband at bedside Disposition Plan: Transfer to telemetry. Discharge when medically stable   Consultants:   General surgery  Procedures:   None  Antimicrobials:  Vancomycin  Zosyn    Subjective: No concerns. No problems overnight.  Objective: Vitals:   02/20/18 1521 02/20/18 2240 02/21/18 0455 02/21/18 0652  BP: (!) 109/57 110/73 116/65 (!) 100/57  Pulse: 86 86 91 91  Resp: 20  18 14   Temp:  98.6 F (37 C) 98.8 F (37.1 C) 99.2 F (37.3 C)  TempSrc:  Oral Oral Oral  SpO2: 97% 100% 99% 98%  Weight:      Height:        Intake/Output Summary (Last 24 hours) at 02/21/2018 1400 Last data filed at 02/21/2018 1319 Gross per 24 hour  Intake 1071.77 ml  Output 2200 ml  Net -1128.23 ml   Filed Weights   02/10/18 0427  Weight: 112 kg    Examination:  General exam: Appears calm and comfortable Respiratory system: Clear to auscultation. Respiratory effort normal. Cardiovascular system: S1 & S2 heard, RRR. No murmurs, rubs, gallops or clicks. Gastrointestinal system: Abdomen is nondistended, soft and nontender. Normal bowel sounds heard. Central nervous system: Alert and oriented. No focal neurological deficits. Extremities: No edema. No calf tenderness. Left knee with no effusion, point tenderness,  joint line tenderness Skin: No cyanosis. No rashes Psychiatry: Judgement and insight appear  normal. Mood & affect appropriate.    Data Reviewed: I have personally reviewed following labs and imaging studies  CBC: Recent Labs  Lab 02/16/18 1032 02/17/18 0526 02/18/18 0520 02/19/18 0558 02/20/18 0610  WBC 11.9* 12.6* 11.8* 14.5* 11.6*  NEUTROABS 7.6 7.4 6.2 9.5* 6.4  HGB 8.4* 8.1* 7.8* 8.4* 8.2*  HCT 25.4* 25.3* 24.7* 26.0* 25.9*  MCV 83.3 85.5 87.0 86.4 88.1  PLT 303 465* 397 466* 426*   Basic Metabolic Panel: Recent Labs  Lab 02/15/18 0548 02/16/18 0527 02/17/18 0526 02/18/18 0520 02/19/18 0558 02/20/18 0610  NA 132*  --  133* 132* 135 135  K 3.9  --  3.9 3.7 3.8 4.2  CL 97*  --  96* 98 98 98  CO2 26  --  28 26 28 25   GLUCOSE 183*  --  187* 145* 173* 161*  BUN 7*  --  6* 6* 8 8  CREATININE 0.75 0.84 0.81 0.77 0.85 0.85  CALCIUM 7.7*  --  7.8* 7.8* 8.0* 8.3*   GFR: Estimated Creatinine Clearance: 86 mL/min (by C-G formula based on SCr of 0.85 mg/dL). Liver Function Tests: No results for input(s): AST, ALT, ALKPHOS, BILITOT, PROT, ALBUMIN in the last 168 hours. No results for input(s): LIPASE, AMYLASE in the last 168 hours. No results for input(s): AMMONIA in the last 168 hours. Coagulation Profile: No results for input(s): INR, PROTIME in the last 168 hours. Cardiac Enzymes: No results for input(s): CKTOTAL, CKMB, CKMBINDEX, TROPONINI in the last 168 hours. BNP (last 3 results) No results for input(s): PROBNP in the last 8760 hours. HbA1C: No results for input(s): HGBA1C in the last 72 hours. CBG: Recent Labs  Lab 02/20/18 1157 02/20/18 1649 02/20/18 2237 02/21/18 0818 02/21/18 1208  GLUCAP 192* 110* 141* 222* 204*   Lipid Profile: No results for input(s): CHOL, HDL, LDLCALC, TRIG, CHOLHDL, LDLDIRECT in the last 72 hours. Thyroid Function Tests: No results for input(s): TSH, T4TOTAL, FREET4, T3FREE, THYROIDAB in the last 72 hours. Anemia Panel: No results for input(s): VITAMINB12, FOLATE, FERRITIN, TIBC, IRON, RETICCTPCT in the last 72  hours. Sepsis Labs: No results for input(s): PROCALCITON, LATICACIDVEN in the last 168 hours.  Recent Results (from the past 240 hour(s))  Culture, blood (routine x 2)     Status: None   Collection Time: 02/13/18  3:18 PM  Result Value Ref Range Status   Specimen Description   Final    BLOOD LEFT ANTECUBITAL Performed at [facility] [address] [place], [place] [address]    Special Requests   Final    BOTTLES DRAWN AEROBIC ONLY Blood Culture adequate volume Performed at [facility] [address] [place], [place] [address]    Culture   Final    NO GROWTH 5 DAYS Performed at [facility], [place] [address], [place], [place] [address]    Report Status 02/18/2018 FINAL  Final  Culture, blood (routine x 2)     Status: None   Collection Time: 02/13/18  3:19 PM  Result Value Ref Range Status   Specimen Description   Final    BLOOD LEFT FOREARM Performed at [facility] [address], [place], [place] [address]    Special Requests   Final    BOTTLES DRAWN AEROBIC ONLY Blood Culture adequate volume Performed at [facility] [address], [place],  [address]  Culture   Final    NO GROWTH 5 DAYS Performed at [facility], [place] [address] [place], [place] [address]    Report Status 02/18/2018 FINAL  Final  Culture, blood (routine x 2)     Status: None (Preliminary result)   Collection Time: 02/20/18  3:42 PM  Result Value Ref Range Status   Specimen Description   Final    BLOOD LEFT ANTECUBITAL Performed at [facility] [address] [place], [place] [address]    Special Requests   Final    BOTTLES DRAWN AEROBIC AND ANAEROBIC Blood Culture adequate volume Performed at [facility] [address], [place], [place] [address]    Culture   Final    NO GROWTH < 24 HOURS Performed at [facility] [address], [place],  [place] [address]    Report Status PENDING  Incomplete  Culture, blood (routine x 2)     Status: None (Preliminary result)   Collection Time: 02/20/18  3:43 PM  Result Value Ref Range Status   Specimen Description   Final    BLOOD LEFT HAND Performed at [facility] [address], [place], [place] [address]    Special Requests   Final    BOTTLES DRAWN AEROBIC ONLY Blood Culture results may not be optimal due to an inadequate volume of blood received in culture bottles Performed at [facility] [address] [place], [place] [address]    Culture   Final    NO GROWTH < 24 HOURS Performed at [facility] [address] [place], [place] [address]    Report Status PENDING  Incomplete         Radiology Studies: No results found.      Scheduled Meds: . amitriptyline  100 mg Oral QHS  . carvedilol  25 mg Oral BID WC  . collagenase   Topical Daily  . doxycycline  100 mg Oral Q12H  . enoxaparin (LOVENOX) injection  40 mg Subcutaneous Q24H  . feeding supplement (ENSURE ENLIVE)  237 mL Oral BID BM  . gabapentin  300 mg Oral TID  . insulin glargine  10 Units Subcutaneous Daily  . insulin regular  0-5 Units Subcutaneous QHS  . insulin regular  0-9 Units Subcutaneous TID WC  . insulin regular  3 Units Subcutaneous TID WC  . levothyroxine  150 mcg Oral QAC breakfast  . metroNIDAZOLE  500 mg Oral Q8H  . morphine  30 mg Oral Q12H  . multivitamin with minerals  1 tablet Oral Daily  . mupirocin cream   Topical Daily  . oxybutynin  15 mg Oral Daily  . pantoprazole  40 mg Oral BID  . polyethylene glycol  17 g Oral BID  . pravastatin  20 mg Oral Daily  . vitamin B-6  250 mg Oral Daily  . sucralfate  1 g Oral TID WC & HS  . vitamin B-12  1,000 mcg Oral Daily  . vitamin E  400 Units Oral Daily   Continuous Infusions: . sodium chloride    . sodium chloride 10 mL/hr at 02/20/18 1500     LOS: 11 days     Cordelia Poche, MD [facility] 02/21/2018, 2:00 PM Pager: [phone]  If 7PM-7AM, please contact night-coverage [email or web address] 02/21/2018, 2:00 PM

## 2018-02-21 NOTE — Consult Note (Addendum)
Reason for Consult: second opinion pressure ulcer Referring Physician: Lurline Del MD Location: [facility] Date: 8.22.2019  Natalie Macdonald is an 62 y.o. female.  HPI: Patient with partial left hemiparesis following ACDF 2018. She has had known pressure ulcer sacrum for months. She was admitted July 2019 with fevers. She was readmitted 8.11.19 with similar fevers, mental status change. Since last admission she has significant progression of sacral ulcer and multiple additional pressure ulcers shoulder and heel. Surgery has consulted 10 d ago and recommended debridement. Patient and family declined. I am asked for additional opinion. MRI this admission suggests osteomyelitis in coccyx. Current wound care packing. Review of chart indicates prior debridement 2016 at [facility] for foot ulcers. No family at bedside.  HbA1c 01/2018 8.3  Past Medical History:  Diagnosis Date  . Allergic rhinitis   . Arthritis    knees, shoulder  . Bronchitis    history  . Cellulitis of left foot   . Cervical disc disorder   . 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 ([place])   . Chronic osteomyelitis ([place])    left foot  . Chronic respiratory infection   . Diabetes mellitus without complication ([place])   . Diabetes mellitus, type II ([place])   . Diabetic foot ulcer ([place])    left foot  . Fibromyalgia   . GERD (gastroesophageal reflux disease)   . Headache    sinus  . History of blood transfusion   . Hypertension   . Hyponatremia   . hypothyroid   . Hypothyroidism   . Joint pain    bilateral legs  . Left hemiplegia ([place])   . Sarcoidosis   . Sepsis ([place])    HX  . Sepsis ([place])   . Uterine fibroid    hx  . Uterine fibroid     Past Surgical History:  Procedure Laterality Date  . ANTERIOR CERVICAL DECOMP/DISCECTOMY FUSION N/A 10/17/2016   Procedure: ANTERIOR CERVICAL DECOMPRESSION/DISCECTOMY FUSION CERVICAL FIVE- CERVICAL SIX, CERVICAL SIX- CERVICAL SEVEN, POSSIBLE CERVICAL SIX CORPECTOMY;   Surgeon: Consuella Lose, MD;  Location: [facility];  Service: Neurosurgery;  Laterality: N/A;  . ANTERIOR CERVICAL DECOMP/DISCECTOMY FUSION    . APPLICATION OF WOUND VAC Left    foot  . CHOLECYSTECTOMY    . FOOT SURGERY Bilateral    implants  . IRRIGATION AND DEBRIDEMENT FOOT Left    X2  . KNEE ARTHROPLASTY    . THYROIDECTOMY    . UTERINE FIBROID SURGERY      Family History  Problem Relation Age of Onset  . Throat cancer Mother     Social History:  reports that she has never smoked. She has never used smokeless tobacco. She reports that she does not drink alcohol or use drugs.  Allergies:  Allergies  Allergen Reactions  . Grapefruit Bioflavonoid Complex Hives, Swelling and Other (See Comments)    Caused her to get hives, swelling of the eye, throat   . Latex Hives  . Procaine Anaphylaxis  . Procaine Anaphylaxis  . Grapefruit Flavor [Flavoring Agent]   . Novolog [Insulin Aspart]     Okay to use Regular insulin (patient doesn't tolerate Novolog)      Medications: I have reviewed the patient's current medications.  Results for orders placed or performed during the hospital encounter of 02/10/18 (from the past 48 hour(s))  Glucose, capillary     Status: Abnormal   Collection Time: 02/19/18 11:51 AM  Result Value Ref Range   Glucose-Capillary 139 (H) 70 - 99  mg/dL  Glucose, capillary     Status: Abnormal   Collection Time: 02/19/18  4:32 PM  Result Value Ref Range   Glucose-Capillary 119 (H) 70 - 99 mg/dL  CBC with Differential/Platelet     Status: Abnormal   Collection Time: 02/20/18  6:10 AM  Result Value Ref Range   WBC 11.6 (H) 4.0 - 10.5 K/uL   RBC 2.94 (L) 3.87 - 5.11 MIL/uL   Hemoglobin 8.2 (L) 12.0 - 15.0 g/dL   HCT 25.9 (L) 36.0 - 46.0 %   MCV 88.1 78.0 - 100.0 fL   MCH 27.9 26.0 - 34.0 pg   MCHC 31.7 30.0 - 36.0 g/dL   RDW 17.7 (H) 11.5 - 15.5 %   Platelets 489 (H) 150 - 400 K/uL   Neutrophils Relative % 56 %   Neutro Abs 6.4 1.7 - 7.7 K/uL   Lymphocytes  Relative 35 %   Lymphs Abs 4.1 (H) 0.7 - 4.0 K/uL   Monocytes Relative 8 %   Monocytes Absolute 1.0 0.1 - 1.0 K/uL   Eosinophils Relative 1 %   Eosinophils Absolute 0.1 0.0 - 0.7 K/uL   Basophils Relative 0 %   Basophils Absolute 0.0 0.0 - 0.1 K/uL    Comment: Performed at [facility], [place] [address], [place], [place] [address]  Basic metabolic panel     Status: Abnormal   Collection Time: 02/20/18  6:10 AM  Result Value Ref Range   Sodium 135 135 - 145 mmol/L   Potassium 4.2 3.5 - 5.1 mmol/L   Chloride 98 98 - 111 mmol/L   CO2 25 22 - 32 mmol/L   Glucose, Bld 161 (H) 70 - 99 mg/dL   BUN 8 8 - 23 mg/dL   Creatinine, Ser 0.85 0.44 - 1.00 mg/dL   Calcium 8.3 (L) 8.9 - 10.3 mg/dL   GFR calc non Af Amer >60 >60 mL/min   GFR calc Af Amer >60 >60 mL/min    Comment: (NOTE) The eGFR has been calculated using the CKD EPI equation. This calculation has not been validated in all clinical situations. eGFR's persistently <60 mL/min signify possible Chronic Kidney Disease.    Anion gap 12 5 - 15    Comment: Performed at [facility], [place] [address] [place], [place] [address]  Glucose, capillary     Status: Abnormal   Collection Time: 02/20/18  7:31 AM  Result Value Ref Range   Glucose-Capillary 185 (H) 70 - 99 mg/dL  Glucose, capillary     Status: Abnormal   Collection Time: 02/20/18 11:57 AM  Result Value Ref Range   Glucose-Capillary 192 (H) 70 - 99 mg/dL  Glucose, capillary     Status: Abnormal   Collection Time: 02/20/18  4:49 PM  Result Value Ref Range   Glucose-Capillary 110 (H) 70 - 99 mg/dL  Glucose, capillary     Status: Abnormal   Collection Time: 02/20/18 10:37 PM  Result Value Ref Range   Glucose-Capillary 141 (H) 70 - 99 mg/dL     ROS Blood pressure (!) 100/57, pulse 91, temperature 99.2 F (37.3 C), temperature source Oral, resp. rate 14, height _0  (1.676 m), weight 112 kg, SpO2 98 %. Physical Exam Gen: alert NAD MS:  examination limited to sacrum with large necrotic ulcer with bone palpable at base.  Foam dressings in place over shoulder hip and heel. On air mattress  Assessment/Plan: My opinion is similar to prior surgical consults from Drs. Gerkin and  Conner. Patient with necrotic wound and this will be recurrent source for infection. Recommend debridement. This will leave open wound in place. She is not candidate for any flap closure at this time. She has had significant progression of this wound and multiple new pressure sores since last admission. Declined SNF placement last admission and has been cared for at home. I similarly agree this will likely be a chronic open wound unless significant changes in off loading, nutrition, DM management.   Irene Limbo, MD [facility]astic [facility] Surgery [phone], pin [phone]   ADDENDUM 8.22.19, 0802 Unable to reach spouse by phone at this time, went to VM.

## 2018-02-22 LAB — GLUCOSE, CAPILLARY
GLUCOSE-CAPILLARY: 216 mg/dL — AB (ref 70–99)
GLUCOSE-CAPILLARY: 216 mg/dL — AB (ref 70–99)
Glucose-Capillary: 178 mg/dL — ABNORMAL HIGH (ref 70–99)
Glucose-Capillary: 157 mg/dL — ABNORMAL HIGH (ref 70–99)

## 2018-02-22 MED ORDER — SENNOSIDES-DOCUSATE SODIUM 8.6-50 MG PO TABS
1.0000 | ORAL_TABLET | Freq: Two times a day (BID) | ORAL | Status: DC
  Administered 2018-02-22 – 2018-02-26 (×9): 1 via ORAL
  Filled 2018-02-22 (×9): qty 1

## 2018-02-22 MED ORDER — ALUM & MAG HYDROXIDE-SIMETH 200-200-20 MG/5ML PO SUSP: 30.0000 mL | ORAL | Status: DC | PRN

## 2018-02-22 MED ORDER — KETOROLAC TROMETHAMINE 15 MG/ML IJ SOLN
INTRAMUSCULAR | Status: AC
  Filled 2018-02-22: qty 1

## 2018-02-22 NOTE — Progress Notes (Addendum)
PROGRESS NOTE    Natalie Macdonald  EYC:[id number] DOB: Aug 17, 1955 DOA: 02/10/2018 PCP: Clifton Custard, MD   Brief Narrative: Natalie Macdonald is a 62 y.o. 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 She presented with altered mental status found to have septic shock. MRI significant for osteomyelitis. Patient declining debridement of her necrotic sacral wound.   Assessment & Plan:   Principal Problem:   Septic shock ([place]) Active Problems:   Diabetes mellitus type 2 with complications ([place])   Left hemiplegia ([place])   Sacral decubitus ulcer, stage III (HCC)   AKI (acute kidney injury) ([place])   Toxic encephalopathy   Septic shock Patient received 3.5 L in the ED with improvement in blood pressure. Blood pressure down again now. Associated kidney injury. Source is wound vs urinary. Leukocytosis. Patient received 8 L of IV fluids. Repeat lactic acid is normal. Urine culture significant for yeast and blood cultures significant for Bacteroides fragilis and coagulase negative staph. -Continue antibiotics  Bacteremia Bacteroides fragilis. In setting of sacral ulcer. -Continue Doxycycline/Flagyl (plan for 6 weeks in setting of osteomyelitis)  Delirium In setting of septic shock. Resolved.  Pressure ulcers Multiple; unstageable sacral, unstageable left posterior shoulder, unstageable left outer thigh, left 2nd toe full thickness wound, left heel deep tissue injury. MRI significant for mild osteomyelitis. General surgery recommending debridement for which the patient refuses. Consulted plastic surgery who agrees with general surgery management plan. 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 Antibiotics as mentioned above.  Acute kidney injury In setting of septic shock. Patient with good urine output. Resolved.  Diabetes mellitus, type 2 Uncontrolled with  hyperglycemia at home and hypoglycemia during admission. Hypoglycemia improved. -Continue Lantus 10 units -Continue SSI (qAC/HS)  Hypokalemia Resolved.  Anemia Chronic. Stable.  Left hemiplegia Chronic. Baseline.  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   DVT prophylaxis: Lovenox Code Status:   Code Status: Full Code Family Communication: None at bedside Disposition Plan: Discharge to another facility for second opinion vs SNF discharge   Consultants:   General surgery  Plastic surgery  Infectious disease  Procedures:   None  Antimicrobials:  Vancomycin  Zosyn  Doxycycline  Flagyl   Subjective: No issues overnight.  Objective: Vitals:   02/21/18 1526 02/21/18 2121 02/22/18 0622 02/22/18 1314  BP: 106/70 (!) 93/54 118/79 128/77  Pulse: 92 91 100 94  Resp: 16 16 14 20   Temp: 99 F (37.2 C) 99 F (37.2 C) 98.3 F (36.8 C) 99.5 F (37.5 C)  TempSrc: Oral Oral Oral Oral  SpO2: 98% 100% 100% 100%  Weight:      Height:        Intake/Output Summary (Last 24 hours) at 02/22/2018 1357 Last data filed at 02/22/2018 0724 Gross per 24 hour  Intake 96.62 ml  Output 800 ml  Net -703.38 ml   Filed Weights   02/10/18 0427  Weight: 112 kg    Examination:  General exam: Appears calm and comfortable Respiratory system: Clear to auscultation. Respiratory effort normal. Cardiovascular system: S1 & S2 heard, RRR. No murmurs. Gastrointestinal system: Abdomen is nondistended, soft and nontender. Normal bowel sounds heard. Central nervous system: Alert and  oriented. No focal neurological deficits. Extremities: No edema. No calf tenderness Skin: No cyanosis. No  rashes Psychiatry: Judgement and insight appear normal. Mood & affect appropriate.    Data Reviewed: I have personally reviewed following labs and imaging studies  CBC: Recent Labs  Lab 02/16/18 1032 02/17/18 0526 02/18/18 0520 02/19/18 0558 02/20/18 0610  WBC 11.9* 12.6* 11.8* 14.5* 11.6*  NEUTROABS 7.6 7.4 6.2 9.5* 6.4  HGB 8.4* 8.1* 7.8* 8.4* 8.2*  HCT 25.4* 25.3* 24.7* 26.0* 25.9*  MCV 83.3 85.5 87.0 86.4 88.1  PLT 303 465* 397 466* 623*   Basic Metabolic Panel: Recent Labs  Lab 02/16/18 0527 02/17/18 0526 02/18/18 0520 02/19/18 0558 02/20/18 0610  NA  --  133* 132* 135 135  K  --  3.9 3.7 3.8 4.2  CL  --  96* 98 98 98  CO2  --  28 26 28 25   GLUCOSE  --  187* 145* 173* 161*  BUN  --  6* 6* 8 8  CREATININE 0.84 0.81 0.77 0.85 0.85  CALCIUM  --  7.8* 7.8* 8.0* 8.3*   GFR: Estimated Creatinine Clearance: 86 mL/min (by C-G formula based on SCr of 0.85 mg/dL). Liver Function Tests: No results for input(s): AST, ALT, ALKPHOS, BILITOT, PROT, ALBUMIN in the last 168 hours. No results for input(s): LIPASE, AMYLASE in the last 168 hours. No results for input(s): AMMONIA in the last 168 hours. Coagulation Profile: No results for input(s): INR, PROTIME in the last 168 hours. Cardiac Enzymes: No results for input(s): CKTOTAL, CKMB, CKMBINDEX, TROPONINI in the last 168 hours. BNP (last 3 results) No results for input(s): PROBNP in the last 8760 hours. HbA1C: No results for input(s): HGBA1C in the last 72 hours. CBG: Recent Labs  Lab 02/21/18 1208 02/21/18 1721 02/21/18 2123 02/22/18 0738 02/22/18 1155  GLUCAP 204* 177* 155* 178* 216*   Lipid Profile: No results for input(s): CHOL, HDL, LDLCALC, TRIG, CHOLHDL, LDLDIRECT in the last 72 hours. Thyroid Function Tests: No results for input(s): TSH, T4TOTAL, FREET4, T3FREE, THYROIDAB in the last 72 hours. Anemia Panel: No results for input(s): VITAMINB12, FOLATE, FERRITIN, TIBC, IRON, RETICCTPCT in the last 72  hours. Sepsis Labs: No results for input(s): PROCALCITON, LATICACIDVEN in the last 168 hours.  Recent Results (from the past 240 hour(s))  Culture, blood (routine x 2)     Status: None   Collection Time: 02/13/18  3:18 PM  Result Value Ref Range Status   Specimen Description   Final    BLOOD LEFT ANTECUBITAL Performed at [facility] [address] [place], [place] [address]    Special Requests   Final    BOTTLES DRAWN AEROBIC ONLY Blood Culture adequate volume Performed at [facility] [address], [place], [place] [address]    Culture   Final    NO GROWTH 5 DAYS Performed at [facility], [place] [address], [place], [place] [address]    Report Status 02/18/2018 FINAL  Final  Culture, blood (routine x 2)     Status: None   Collection Time: 02/13/18  3:19 PM  Result Value Ref Range Status   Specimen Description   Final    BLOOD LEFT FOREARM Performed at [facility] [address] [place], [place] [address]    Special Requests   Final    BOTTLES DRAWN AEROBIC ONLY Blood Culture adequate volume Performed at [facility] [address] [place], [place] [address]  Culture   Final    NO GROWTH 5 DAYS Performed at [facility], [place] [address], [place], [place] [address]    Report Status 02/18/2018 FINAL  Final  Culture, blood (routine x 2)     Status: None (Preliminary result)   Collection Time: 02/20/18  3:42 PM  Result Value Ref Range Status   Specimen Description   Final    BLOOD LEFT ANTECUBITAL Performed at [facility] [address], [place], [place] [address]    Special Requests   Final    BOTTLES DRAWN AEROBIC AND ANAEROBIC Blood Culture adequate volume Performed at [facility] [address], [place], [place] [address]    Culture   Final    NO GROWTH 2 DAYS Performed at [facility] [address], [place], [place]  [address]    Report Status PENDING  Incomplete  Culture, blood (routine x 2)     Status: None (Preliminary result)   Collection Time: 02/20/18  3:43 PM  Result Value Ref Range Status   Specimen Description   Final    BLOOD LEFT HAND Performed at [facility] [address] [place], [place] [address]    Special Requests   Final    BOTTLES DRAWN AEROBIC ONLY Blood Culture results may not be optimal due to an inadequate volume of blood received in culture bottles Performed at [facility] [address], [place], [place] [address]    Culture   Final    NO GROWTH 2 DAYS Performed at [facility] [address], [place],  [address]    Report Status PENDING  Incomplete         Radiology Studies: No results found.      Scheduled Meds: . amitriptyline  100 mg Oral QHS  . carvedilol  25 mg Oral BID WC  . collagenase   Topical Daily  . doxycycline  100 mg Oral Q12H  . enoxaparin (LOVENOX) injection  40 mg Subcutaneous Q24H  . feeding supplement (ENSURE ENLIVE)  237 mL Oral BID BM  . gabapentin  300 mg Oral TID  . insulin glargine  10 Units Subcutaneous Daily  . insulin regular  0-5 Units Subcutaneous QHS  . insulin regular  0-9 Units Subcutaneous TID WC  . insulin regular  3 Units Subcutaneous TID WC  . ketorolac      . levothyroxine  150 mcg Oral QAC breakfast  . metroNIDAZOLE  500 mg Oral Q8H  . morphine  30 mg Oral Q12H  . multivitamin with minerals  1 tablet Oral Daily  . mupirocin cream   Topical Daily  . oxybutynin  15 mg Oral Daily  . pantoprazole  40 mg Oral BID  . polyethylene glycol  17 g Oral BID  . pravastatin  20 mg Oral Daily  . vitamin B-6  250 mg Oral Daily  . senna-docusate  1 tablet Oral BID  . sucralfate  1 g Oral TID WC & HS  . vitamin B-12  1,000 mcg Oral Daily  . vitamin E  400 Units Oral Daily   Continuous Infusions: . sodium chloride    . sodium chloride 250 mL (02/22/18 0155)     LOS: 12  days     Cordelia Poche, MD [facility] 02/22/2018, 1:57 PM Pager: [phone]  If 7PM-7AM, please contact night-coverage [email or web address] 02/22/2018, 1:57 PM

## 2018-02-22 NOTE — Progress Notes (Signed)
PT Cancellation Note  Patient Details Name: Natalie Macdonald MRN: [id number] DOB: 1955/12/30   Cancelled Treatment:    Reason Eval/Treat Not Completed: Attempted PT eval on today. Pt declines participation with therapy on today. Will check back another day. If pt declines to participate a 3rd time, will sign off.    Weston Anna, MPT Pager: [phone]

## 2018-02-22 NOTE — Care Management Note (Signed)
Case Management Note  Patient Details  Name: Natalie Macdonald MRN: [id number] Date of Birth: 01-16-1956  Subjective/Objective: [facility] did not accept . D/c plan SNF-CSW notified. Await safe d/c plan to SNF.Patient(in rm)/spouse(by phone) informed.                   Action/Plan:d/c SNF.   Expected Discharge Date:  (unknown)               Expected Discharge Plan:  [place]  In-House Referral:  Clinical Social Work  Discharge planning Services  CM Consult  Post Acute Care Choice:    Choice offered to:     DME Arranged:  [facility] DME Agency:  [place]:    [place] Agency:     Status of Service:  In process, will continue to follow  If discussed at Long Length of Stay Meetings, dates discussed:    Additional Comments:  Dessa Phi, RN 02/22/2018, 5:00 PM

## 2018-02-22 NOTE — Progress Notes (Signed)
Came into rm & spouse was in the rm-informed of [facility] not accepting-I have informed Dr. Lonny Prude of spouse's request for [facility] to be contacted for Acute to acute transfer-explained to spouse that this would be a up to the doctor to contact these facilities. Spouse has many concerns about the surgeon's plan after the wound is open what would be the additional treatment plan-colostomy, urinary catheter, an infection of the bone-know one explained these concerns for a plan.I have given the spouse the tel# for Patient Experience. Will await further instructions on whether other facilities for transfer will be contacted, or CSW to have a SNF bed available. Then we can proceed if appeal process to be initiated.

## 2018-02-22 NOTE — Care Management Note (Signed)
Case Management Note  Patient Details  Name: Darlene Brozowski MRN: [id number] Date of Birth: [birth year]  Subjective/Objective:Per patient request-spoke to spouse Chrissie Noa on nurse's phone-listened to spouse concerns:wanting a 2nd opinion(wound specialist) to eval for the sacral wound-he feels that an open wound after debridement left open is not a good plan, he has mentioned some issues about patient falling, & the care,taken off iv abx & put on po abx & now feels we want to d/c because of his complaints-reassured spouse that we doing our best to provide the best care.Initially CM informed him of his appeal rights, but after discussion determined that there is no safe d/c plan, unable to enact appeal process. He agrees to the doctor contacting [facility] for a wound specialist & acceptance as an Acute to acute transfer, & agrees to SNF-patient is in agreement to everything the spouse recc.CSW has been contacted to start SNF search-wound care since patient has L hemiplegia-w/c bound.                   Action/Plan:d/c plan Acute to acute transfer   Expected Discharge Date:  (unknown)               Expected Discharge Plan:  Acute to Acute Transfer  In-House Referral:     Discharge planning Services  CM Consult  Post Acute Care Choice:    Choice offered to:     DME Arranged:  [facility] DME Agency:  [place]:    [place] Agency:     Status of Service:  In process, will continue to follow  If discussed at Long Length of Stay Meetings, dates discussed:    Additional Comments:  Dessa Phi, RN 02/22/2018, 1:03 PM

## 2018-02-23 LAB — BASIC METABOLIC PANEL
Anion gap: 7 (ref 5–15)
BUN: 7 mg/dL — AB (ref 8–23)
CHLORIDE: 96 mmol/L — AB (ref 98–111)
CO2: 29 mmol/L (ref 22–32)
CREATININE: 0.71 mg/dL (ref 0.44–1.00)
Calcium: 8.5 mg/dL — ABNORMAL LOW (ref 8.9–10.3)
GFR calc non Af Amer: >60 mL/min (ref >60)
Glucose, Bld: 165 mg/dL — ABNORMAL HIGH (ref 70–99)
Potassium: 3.8 mmol/L (ref 3.5–5.1)
Sodium: 132 mmol/L — ABNORMAL LOW (ref 135–145)

## 2018-02-23 LAB — GLUCOSE, CAPILLARY
GLUCOSE-CAPILLARY: 145 mg/dL — AB (ref 70–99)
Glucose-Capillary: 166 mg/dL — ABNORMAL HIGH (ref 70–99)
Glucose-Capillary: 144 mg/dL — ABNORMAL HIGH (ref 70–99)
Glucose-Capillary: 183 mg/dL — ABNORMAL HIGH (ref 70–99)

## 2018-02-23 LAB — CBC
HEMATOCRIT: 24.7 % — AB (ref 36.0–46.0)
Hemoglobin: 7.8 g/dL — ABNORMAL LOW (ref 12.0–15.0)
MCH: 28.1 pg (ref 26.0–34.0)
MCHC: 31.6 g/dL (ref 30.0–36.0)
MCV: 88.8 fL (ref 78.0–100.0)
PLATELETS: 560 10*3/uL — AB (ref 150–400)
RBC: 2.78 MIL/uL — AB (ref 3.87–5.11)
RDW: 18.4 % — ABNORMAL HIGH (ref 11.5–15.5)
WBC: 9 10*3/uL (ref 4.0–10.5)

## 2018-02-23 NOTE — Progress Notes (Signed)
Attempted to turn patient for the second time this shift. Pt states, "I turned myself to the left a couple of hours ago." Pt does not want to be turned at this time, but requests staff to come back "after breakfast" to be turned. Will do dressing changes at that time. Breakfast ordered now.

## 2018-02-23 NOTE — Clinical Social Work Note (Signed)
Clinical Social Work Assessment  Patient Details  Name: Natalie Macdonald MRN: [id number] Date of Birth: 12/18/1955  Date of referral:  02/23/18               Reason for consult:  Facility Placement                Permission sought to share information with:  Facility Sport and exercise psychologist, Family Supports Permission granted to share information::  No  Name::     Maxie Debose  Agency::     Relationship::  Spouse  Contact Information:  [phone]  Housing/Transportation Living arrangements for the past 2 months:  Single Family Home Source of Information:  Spouse Patient Interpreter Needed:  None Criminal Activity/Legal Involvement Pertinent to Current Situation/Hospitalization:  No - Comment as needed Significant Relationships:  Adult Children, Spouse Lives with:  Spouse Do you feel safe going back to the place where you live?  Yes Need for family participation in patient care:  No (Coment)  Care giving concerns:  Patient will likely need therapy to regain strength but has refused PT three consecutive days.    Social Worker assessment / plan:  CSW consulted for SNF placement, however, patient has refused PT for three consecutive days and they have signed off. CSW spoke with patient and patient's husband, Chrissie Noa, about d/c plans and need to work with PT.   Patient's husband expressed many frustrations with current admission (including nurses, surgeons, techs etc). Patient and husband do not wish to seek SNF placement currently. He does not feel that it is appropriate at this time and is set on patient going to another hospital. Patient's husband expressed desire and intent to transfer patient to another hospital [facility], [facility], etc). Patient's husband plans to appeal d/c order.  CSW will not be pursuing SNF placement at this time at patient and family's request and no other needs were identified.   Employment status:  Retired Nurse, adult PT  Recommendations:  [place] / Referral to community resources:     Patient/Family's Response to care:  Patient's husband is not happy with current care. He was respectful during our conversation but firm in his frustrations and dissatisfaction surrounding current admission.   Patient/Family's Understanding of and Emotional Response to Diagnosis, Current Treatment, and Prognosis:  Patient and family understand current care, however, patient's husband desires more explanations and plans from doctors. He feels no one has given him the answers he is looking for.   Emotional Assessment Appearance:  Appears stated age Attitude/Demeanor/Rapport:    Affect (typically observed):    Orientation:  Oriented to Self, Oriented to Place, Oriented to  Time, Oriented to Situation Alcohol / Substance use:  Not Applicable Psych involvement (Current and /or in the community):  No (Comment)  Discharge Needs  Concerns to be addressed:  Patient refuses services Readmission within the last 30 days:  No Current discharge risk:  Physical Impairment Barriers to Discharge:      Pricilla Holm, [place] 02/23/2018, 4:59 PM

## 2018-02-23 NOTE — Progress Notes (Signed)
PROGRESS NOTE    Natalie Macdonald  KDX:[id number] DOB: 01/06/1956 DOA: 02/10/2018 PCP: Clifton Custard, MD   Brief Narrative: Natalie Macdonald is a 62 y.o. 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 She presented with altered mental status found to have septic shock. MRI significant for osteomyelitis. Patient declining debridement of her necrotic sacral wound.   Assessment & Plan:   Principal Problem:   Septic shock ([place]) Active Problems:   Diabetes mellitus type 2 with complications ([place])   Left hemiplegia ([place])   Sacral decubitus ulcer, stage III (HCC)   AKI (acute kidney injury) ([place])   Toxic encephalopathy   Septic shock Patient received 3.5 L in the ED with improvement in blood pressure. Blood pressure down again now. Associated kidney injury. Source is wound vs urinary. Leukocytosis. Patient received 8 L of IV fluids. Repeat lactic acid is normal. Urine culture significant for yeast and blood cultures significant for Bacteroides fragilis and coagulase negative staph. -Continue antibiotics  Bacteremia Bacteroides fragilis. In setting of sacral ulcer. -Continue Doxycycline/Flagyl (plan for 6 weeks in setting of osteomyelitis)  Delirium In setting of septic shock. Resolved.  Pressure ulcers Multiple; unstageable sacral, unstageable left posterior shoulder, unstageable left outer thigh, left 2nd toe full thickness wound, left heel deep tissue injury. MRI significant for mild osteomyelitis. General surgery recommending debridement for which the patient refuses secondary to a resultant larger and open wound. Consulted plastic surgery who agrees with general surgery management plan. Attempted transfer to [facility] which was declined secondary to consistent plan and patient is still not in agreement. Discussed with patient and husband that this wound will continue  to worsen and serve as a source for infection. They have expressed full understanding of the situation.  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 Antibiotics as mentioned above.  Acute kidney injury In setting of septic shock. Patient with good urine output. Resolved.  Diabetes mellitus, type 2 Uncontrolled with hyperglycemia at home and hypoglycemia during admission. Hypoglycemia improved. -Continue Lantus 10 units -Continue SSI (qAC/HS)  Hypokalemia Resolved.  Anemia Chronic. Stable.  Left hemiplegia Chronic. Baseline.  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   DVT prophylaxis: Lovenox Code Status:   Code Status: Full Code Family Communication: None at bedside Disposition Plan: Discharge to SNF when bed available. In setting of patient's desires, patient is stable for discharge from the hospital. Optimally, patient would obtain debridement prior to discharge for maximal treatment of her wound/sepsis source.   Consultants:   General surgery  Plastic surgery  Infectious disease  Procedures:   None  Antimicrobials:  Vancomycin  Zosyn  Doxycycline  Flagyl   Subjective: Wondering if she could have RA or MS.  Objective: Vitals:   02/22/18 0622 02/22/18 1314 02/22/18 2057 02/23/18 0443  BP: 118/79 128/77 113/63 136/78  Pulse: 100 94 91 94  Resp: 14 20 18 20   Temp: 98.3 F (36.8 C) 99.5 F (37.5 C) 98.6 F (37 C) 99.3 F (37.4 C)  TempSrc: Oral Oral Oral Oral  SpO2: 100% 100% 100% 99%  Weight:      Height:        Intake/Output Summary (Last 24  hours) at 02/23/2018 1335 Last data filed at 02/23/2018 1234 Gross per 24 hour  Intake 222  ml  Output 2600 ml  Net -2378 ml   Filed Weights   02/10/18 0427  Weight: 112 kg    Examination:  General exam: Appears calm and comfortable Respiratory system: Clear to auscultation. Respiratory effort normal. Cardiovascular system: S1 & S2 heard, RRR. No murmurs. Gastrointestinal system: Abdomen is nondistended, soft and nontender. Normal bowel sounds heard. Central nervous system: Alert and oriented. No focal neurological deficits. Extremities: Trace edema. No calf tenderness Skin: No cyanosis. No rashes Psychiatry: Judgement and insight appear normal. Mood & affect appropriate.    Data Reviewed: I have personally reviewed following labs and imaging studies  CBC: Recent Labs  Lab 02/17/18 0526 02/18/18 0520 02/19/18 0558 02/20/18 0610  WBC 12.6* 11.8* 14.5* 11.6*  NEUTROABS 7.4 6.2 9.5* 6.4  HGB 8.1* 7.8* 8.4* 8.2*  HCT 25.3* 24.7* 26.0* 25.9*  MCV 85.5 87.0 86.4 88.1  PLT 465* 397 466* 357*   Basic Metabolic Panel: Recent Labs  Lab 02/17/18 0526 02/18/18 0520 02/19/18 0558 02/20/18 0610  NA 133* 132* 135 135  K 3.9 3.7 3.8 4.2  CL 96* 98 98 98  CO2 28 26 28 25   GLUCOSE 187* 145* 173* 161*  BUN 6* 6* 8 8  CREATININE 0.81 0.77 0.85 0.85  CALCIUM 7.8* 7.8* 8.0* 8.3*   GFR: Estimated Creatinine Clearance: 86 mL/min (by C-G formula based on SCr of 0.85 mg/dL). Liver Function Tests: No results for input(s): AST, ALT, ALKPHOS, BILITOT, PROT, ALBUMIN in the last 168 hours. No results for input(s): LIPASE, AMYLASE in the last 168 hours. No results for input(s): AMMONIA in the last 168 hours. Coagulation Profile: No results for input(s): INR, PROTIME in the last 168 hours. Cardiac Enzymes: No results for input(s): CKTOTAL, CKMB, CKMBINDEX, TROPONINI in the last 168 hours. BNP (last 3 results) No results for input(s): PROBNP in the last 8760 hours. HbA1C: No results for input(s): HGBA1C in the last 72 hours. CBG: Recent Labs  Lab 02/22/18 0738  02/22/18 1155 02/22/18 1636 02/22/18 2054 02/23/18 0755  GLUCAP 178* 216* 216* 157* 166*   Lipid Profile: No results for input(s): CHOL, HDL, LDLCALC, TRIG, CHOLHDL, LDLDIRECT in the last 72 hours. Thyroid Function Tests: No results for input(s): TSH, T4TOTAL, FREET4, T3FREE, THYROIDAB in the last 72 hours. Anemia Panel: No results for input(s): VITAMINB12, FOLATE, FERRITIN, TIBC, IRON, RETICCTPCT in the last 72 hours. Sepsis Labs: No results for input(s): PROCALCITON, LATICACIDVEN in the last 168 hours.  Recent Results (from the past 240 hour(s))  Culture, blood (routine x 2)     Status: None   Collection Time: 02/13/18  3:18 PM  Result Value Ref Range Status   Specimen Description   Final    BLOOD LEFT ANTECUBITAL Performed at [facility] [address], [place], [place] [address]    Special Requests   Final    BOTTLES DRAWN AEROBIC ONLY Blood Culture adequate volume Performed at [facility] [address], [place], [place] [address]    Culture   Final    NO GROWTH 5 DAYS Performed at [facility], [place] [address], [place], [place] [address]    Report Status 02/18/2018 FINAL  Final  Culture, blood (routine x 2)     Status: None   Collection Time: 02/13/18  3:19 PM  Result Value Ref Range Status   Specimen Description   Final  BLOOD LEFT FOREARM Performed at [facility] [address], [place], [place] [address]    Special Requests   Final    BOTTLES DRAWN AEROBIC ONLY Blood Culture adequate volume Performed at [facility] [address] [place], [place] [address]    Culture   Final    NO GROWTH 5 DAYS Performed at [facility], [place] [address], [place], [place] [address]    Report Status 02/18/2018 FINAL  Final  Culture, blood (routine x 2)     Status: None (Preliminary result)   Collection Time: 02/20/18  3:42 PM  Result Value Ref Range Status   Specimen Description    Final    BLOOD LEFT ANTECUBITAL Performed at [facility] [address] [place], [place] [address]    Special Requests   Final    BOTTLES DRAWN AEROBIC AND ANAEROBIC Blood Culture adequate volume Performed at [facility] [address], [place], [place] [address]    Culture   Final    NO GROWTH 3 DAYS Performed at [facility], [place] [address] [place], [place] [address]    Report Status PENDING  Incomplete  Culture, blood (routine x 2)     Status: None (Preliminary result)   Collection Time: 02/20/18  3:43 PM  Result Value Ref Range Status   Specimen Description   Final    BLOOD LEFT HAND Performed at [facility] [address], [place], [place] [address]    Special Requests   Final    BOTTLES DRAWN AEROBIC ONLY Blood Culture results may not be optimal due to an inadequate volume of blood received in culture bottles Performed at [facility] [address], [place], [place] [address]    Culture   Final    NO GROWTH 3 DAYS Performed at [facility], [place] [address] [place], [place] [address]    Report Status PENDING  Incomplete         Radiology Studies: No results found.      Scheduled Meds: . amitriptyline  100 mg Oral QHS  . carvedilol  25 mg Oral BID WC  . collagenase   Topical Daily  . doxycycline  100 mg Oral Q12H  . enoxaparin (LOVENOX) injection  40 mg Subcutaneous Q24H  . feeding supplement (ENSURE ENLIVE)  237 mL Oral BID BM  . gabapentin  300 mg Oral TID  . insulin glargine  10 Units Subcutaneous Daily  . insulin regular  0-5 Units Subcutaneous QHS  . insulin regular  0-9 Units Subcutaneous TID WC  . insulin regular  3 Units Subcutaneous TID WC  . levothyroxine  150 mcg Oral QAC breakfast  . metroNIDAZOLE  500 mg Oral Q8H  . morphine  30 mg Oral Q12H  . multivitamin with minerals  1 tablet Oral Daily  . mupirocin cream   Topical Daily  . oxybutynin  15  mg Oral Daily  . pantoprazole  40 mg Oral BID  . polyethylene glycol  17 g Oral BID  . pravastatin  20 mg Oral Daily  . vitamin B-6  250 mg Oral Daily  . senna-docusate  1 tablet Oral BID  . sucralfate  1 g Oral TID WC & HS  . vitamin B-12  1,000 mcg Oral Daily  . vitamin E  400 Units Oral Daily   Continuous Infusions: . sodium chloride    . sodium chloride 250 mL (02/22/18  0155)     LOS: 13 days     Cordelia Poche, MD [facility] 02/23/2018, 1:35 PM Pager: [phone]  If 7PM-7AM, please contact night-coverage [email or web address] 02/23/2018, 1:35 PM

## 2018-02-23 NOTE — Progress Notes (Signed)
Physical Therapy Discharge Patient Details Name: Natalie Macdonald MRN: [id number] DOB: 11-06-1955 Today's Date: 02/23/2018 Time:  -     Patient discharged from PT services secondary to patient has refused 3 (three) consecutive times . Patient reports that she will want PT at another time when able to tolerate sitting up. She reports buttocks too painful at present.    Previous admission patient  required total assistance for mobility.     Progress and discharge plan discussed with patient and/or caregiver: Patient agreed for PT when in not pain from large decubitus.  GP     Marcelino Freestone PT [phone]  02/23/2018, 11:24 AM

## 2018-02-24 LAB — CBC
HCT: 23.6 % — ABNORMAL LOW (ref 36.0–46.0)
Hemoglobin: 7.7 g/dL — ABNORMAL LOW (ref 12.0–15.0)
MCH: 29.1 pg (ref 26.0–34.0)
MCHC: 32.6 g/dL (ref 30.0–36.0)
MCV: 89.1 fL (ref 78.0–100.0)
PLATELETS: 498 10*3/uL — AB (ref 150–400)
RBC: 2.65 MIL/uL — ABNORMAL LOW (ref 3.87–5.11)
RDW: 18.7 % — ABNORMAL HIGH (ref 11.5–15.5)
WBC: 9 10*3/uL (ref 4.0–10.5)

## 2018-02-24 LAB — GLUCOSE, CAPILLARY
GLUCOSE-CAPILLARY: 176 mg/dL — AB (ref 70–99)
GLUCOSE-CAPILLARY: 212 mg/dL — AB (ref 70–99)
GLUCOSE-CAPILLARY: 172 mg/dL — AB (ref 70–99)
Glucose-Capillary: 231 mg/dL — ABNORMAL HIGH (ref 70–99)

## 2018-02-24 MED ORDER — MAGNESIUM HYDROXIDE 400 MG/5ML PO SUSP
5.0000 mL | Freq: Every day | ORAL | Status: DC
  Administered 2018-02-24 – 2018-02-25 (×2): 5 mL via ORAL
  Filled 2018-02-24 (×3): qty 30

## 2018-02-24 NOTE — Progress Notes (Signed)
Discharge Planning: NCM contacted husband, Chrissie Noa via phone to discuss dc and Medicare appeal. Husband states he will be at bedside in the next hour and will discuss with NCM at that time. He states he will take her to [facility]. Does not want her to got to SNF. Jonnie Finner RN CCM Case Mgmt phone [phone]

## 2018-02-24 NOTE — Progress Notes (Signed)
Patient turned, repositioned, and purewick adjusted.

## 2018-02-24 NOTE — Progress Notes (Signed)
PROGRESS NOTE    Natalie Macdonald  KGU:[id number] DOB: 1956-03-13 DOA: 02/10/2018 PCP: Clifton Custard, MD   Brief Narrative: Natalie Macdonald is a 62 y.o. 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 She presented with altered mental status found to have septic shock. MRI significant for osteomyelitis. Patient declining debridement of her necrotic sacral wound.   Assessment & Plan:  Principal Problem:   Septic shock ([place]) Active Problems:   Diabetes mellitus type 2 with complications ([place])   Left hemiplegia ([place])   Sacral decubitus ulcer, stage III (HCC)   AKI (acute kidney injury) ([place])   Toxic encephalopathy   Septic shock Patient received 3.5 L in the ED with improvement in blood pressure. Blood pressure down again now. Associated kidney injury. Source is wound vs urinary. Leukocytosis. Patient received 8 L of IV fluids. Repeat lactic acid is normal. Urine culture significant for yeast and blood cultures significant for Bacteroides fragilis and coagulase negative staph. -Continue antibiotics  Bacteremia Bacteroides fragilis. In setting of sacral ulcer. -Continue Doxycycline/Flagyl (plan for 6 weeks in setting of osteomyelitis)  Delirium In setting of septic shock. Resolved.  Pressure ulcers Multiple; unstageable sacral, unstageable left posterior shoulder, unstageable left outer thigh, left 2nd toe full thickness wound, left heel deep tissue injury. MRI significant for mild osteomyelitis. General surgery recommending debridement for which the patient refuses secondary to a resultant larger and open wound. Consulted plastic surgery who agrees with general surgery management plan. Attempted transfer to [facility] which was declined secondary to consistent plan and patient is still not in agreement. Discussed with patient and husband that this wound will continue  to worsen and serve as a source for infection. They have expressed full understanding of the situation. They understand that this wound may become life threatening. Wound continues to worsen. WBC stable. Afebrile. 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 Antibiotics as mentioned above.  Acute kidney injury In setting of septic shock. Patient with good urine output. Resolved.  Diabetes mellitus, type 2 Uncontrolled with hyperglycemia at home and hypoglycemia during admission. Hypoglycemia improved. -Continue Lantus 10 units -Continue SSI (qAC/HS)  Hypokalemia Resolved.  Anemia Worsened slightly in setting of her wound. No obvious bleeding. Stable from yesterday. -Repeat CBC in AM. Transfuse for hemoglobin <7 or if symptomatic  Left hemiplegia Chronic. Baseline.  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   DVT prophylaxis: Lovenox Code Status:   Code Status: Full Code Family Communication: None at bedside Disposition Plan: Discharge to SNF when bed available. In setting of patient's desires, patient is stable for discharge from the hospital to continue oral antibiotics. Optimally, patient would obtain debridement prior to discharge for maximal treatment of her wound/sepsis source.   Consultants:   General surgery  Plastic surgery  Infectious disease  Procedures:   None  Antimicrobials:  Vancomycin  Zosyn  Doxycycline  Flagyl   Subjective: No issues overnight.  Objective: Vitals:   02/23/18 0443 02/23/18 1424 02/23/18 2148 02/24/18 0431  BP: 136/78 (!) 114/55 90/62 116/82  Pulse: 94 88 90 93  Resp: 20 18 18 18   Temp: 99.3 F (37.4 C) 98.8 F  (37.1 C) 99 F (  37.2 C) 98.6 F (37 C)  TempSrc: Oral Oral Oral Oral  SpO2: 99% 100% 93%   Weight:      Height:        Intake/Output Summary (Last 24 hours) at 02/24/2018 1251 Last data filed at 02/24/2018 0947 Gross per 24 hour  Intake 220 ml  Output 1750 ml  Net -1530 ml   Filed Weights   02/10/18 0427  Weight: 112 kg    Examination:  General exam: Appears calm and comfortable Respiratory system: Clear to auscultation. Respiratory effort normal. Cardiovascular system: S1 & S2 heard, RRR. No murmurs, rubs, gallops or clicks. Gastrointestinal system: Abdomen is nondistended, soft and nontender. No organomegaly or masses felt. Normal bowel sounds heard. Central nervous system: Alert and oriented. No focal neurological deficits. Extremities: LE edema. No calf tenderness Skin: No cyanosis. No rashes Psychiatry: Judgement and insight appear normal. Mood & affect depressed and flat    Data Reviewed: I have personally reviewed following labs and imaging studies  CBC: Recent Labs  Lab 02/18/18 0520 02/19/18 0558 02/20/18 0610 02/23/18 1244 02/24/18 1116  WBC 11.8* 14.5* 11.6* 9.0 9.0  NEUTROABS 6.2 9.5* 6.4  --   --   HGB 7.8* 8.4* 8.2* 7.8* 7.7*  HCT 24.7* 26.0* 25.9* 24.7* 23.6*  MCV 87.0 86.4 88.1 88.8 89.1  PLT 397 466* 489* 560* 662*   Basic Metabolic Panel: Recent Labs  Lab 02/18/18 0520 02/19/18 0558 02/20/18 0610 02/23/18 1244  NA 132* 135 135 132*  K 3.7 3.8 4.2 3.8  CL 98 98 98 96*  CO2 26 28 25 29   GLUCOSE 145* 173* 161* 165*  BUN 6* 8 8 7*  CREATININE 0.77 0.85 0.85 0.71  CALCIUM 7.8* 8.0* 8.3* 8.5*   GFR: Estimated Creatinine Clearance: 91.4 mL/min (by C-G formula based on SCr of 0.71 mg/dL). Liver Function Tests: No results for input(s): AST, ALT, ALKPHOS, BILITOT, PROT, ALBUMIN in the last 168 hours. No results for input(s): LIPASE, AMYLASE in the last 168 hours. No results for input(s): AMMONIA in the last 168 hours. Coagulation  Profile: No results for input(s): INR, PROTIME in the last 168 hours. Cardiac Enzymes: No results for input(s): CKTOTAL, CKMB, CKMBINDEX, TROPONINI in the last 168 hours. BNP (last 3 results) No results for input(s): PROBNP in the last 8760 hours. HbA1C: No results for input(s): HGBA1C in the last 72 hours. CBG: Recent Labs  Lab 02/23/18 1409 02/23/18 1804 02/23/18 2145 02/24/18 0736 02/24/18 1135  GLUCAP 144* 183* 145* 176* 231*   Lipid Profile: No results for input(s): CHOL, HDL, LDLCALC, TRIG, CHOLHDL, LDLDIRECT in the last 72 hours. Thyroid Function Tests: No results for input(s): TSH, T4TOTAL, FREET4, T3FREE, THYROIDAB in the last 72 hours. Anemia Panel: No results for input(s): VITAMINB12, FOLATE, FERRITIN, TIBC, IRON, RETICCTPCT in the last 72 hours. Sepsis Labs: No results for input(s): PROCALCITON, LATICACIDVEN in the last 168 hours.  Recent Results (from the past 240 hour(s))  Culture, blood (routine x 2)     Status: None (Preliminary result)   Collection Time: 02/20/18  3:42 PM  Result Value Ref Range Status   Specimen Description   Final    BLOOD LEFT ANTECUBITAL Performed at [facility] [address] [place], [place] [address]    Special Requests   Final    BOTTLES DRAWN AEROBIC AND ANAEROBIC Blood Culture adequate volume Performed at [facility] [address], [place], [place] [address]    Culture   Final  NO GROWTH 3 DAYS Performed at [facility], [place] [address], [place], [place] [address]    Report Status PENDING  Incomplete  Culture, blood (routine x 2)     Status: None (Preliminary result)   Collection Time: 02/20/18  3:43 PM  Result Value Ref Range Status   Specimen Description   Final    BLOOD LEFT HAND Performed at [facility] [address] [place], [place] [address]    Special Requests   Final    BOTTLES DRAWN AEROBIC ONLY Blood Culture results may not be optimal due  to an inadequate volume of blood received in culture bottles Performed at [facility] [address], [place], [place] [address]    Culture   Final    NO GROWTH 3 DAYS Performed at [facility], [place] [address], [place], [place] [address]    Report Status PENDING  Incomplete         Radiology Studies: No results found.      Scheduled Meds: . amitriptyline  100 mg Oral QHS  . carvedilol  25 mg Oral BID WC  . collagenase   Topical Daily  . doxycycline  100 mg Oral Q12H  . enoxaparin (LOVENOX) injection  40 mg Subcutaneous Q24H  . feeding supplement (ENSURE ENLIVE)  237 mL Oral BID BM  . gabapentin  300 mg Oral TID  . insulin glargine  10 Units Subcutaneous Daily  . insulin regular  0-5 Units Subcutaneous QHS  . insulin regular  0-9 Units Subcutaneous TID WC  . insulin regular  3 Units Subcutaneous TID WC  . levothyroxine  150 mcg Oral QAC breakfast  . magnesium hydroxide  5 mL Oral Daily  . metroNIDAZOLE  500 mg Oral Q8H  . morphine  30 mg Oral Q12H  . multivitamin with minerals  1 tablet Oral Daily  . mupirocin cream   Topical Daily  . oxybutynin  15 mg Oral Daily  . pantoprazole  40 mg Oral BID  . polyethylene glycol  17 g Oral BID  . pravastatin  20 mg Oral Daily  . vitamin B-6  250 mg Oral Daily  . senna-docusate  1 tablet Oral BID  . sucralfate  1 g Oral TID WC & HS  . vitamin B-12  1,000 mcg Oral Daily  . vitamin E  400 Units Oral Daily   Continuous Infusions: . sodium chloride    . sodium chloride Stopped (02/22/18 1033)     LOS: 14 days     Cordelia Poche, MD [facility] 02/24/2018, 12:51 PM Pager: [phone]  If 7PM-7AM, please contact night-coverage [email or web address] 02/24/2018, 12:51 PM

## 2018-02-24 NOTE — Progress Notes (Signed)
MD Nettey called to patient's room to speak with husband and patient regarding discharge order. This RN was a witness at bedside as MD Nettey explained discharge order and patient's options from here forward. Pt and husband aware pt has been D/C'd to SNF as that is the only safe D/C plan, but continue to refuse SNF. Also says he will not leave AMA. AC notified and both AC's also explained to pt and husband about discharge. Discharge appeal instructions explained, and husband aware he must call the Medicare phone number by midnight tonight.

## 2018-02-24 NOTE — Care Management Important Message (Signed)
Important Message  Patient Details  Name: Natalie Macdonald MRN: [id number] Date of Birth: 30-May-1956   Medicare Important Message Given:  Yes    Erenest Rasher, RN 02/24/2018, 3:08 PM

## 2018-02-24 NOTE — Progress Notes (Addendum)
NCM spoke to pt and husband at bedside. Explained Medicare IM and Detailed Notice of Discharge. Husband contacted [facility] for Medicare Appeal of pt's dc. States he cannot take pt home with HH. And wanted to see CSW to discuss SNF.   247 pm Contacted [employer] and confirmed appeal pending. Husband signed Detailed Notice of Discharge. Jonnie Finner RN CCM Case Mgmt phone [phone]

## 2018-02-24 NOTE — Progress Notes (Signed)
Late entry note.  On 8/24, I discussed plan of care with patient in the presence of her husband. I explained that the preferred plan of care would be for debridement of her infected/necrotic wound. She has been stabilized, however, this wound will continue to worsen and serve as a continued source of infection likely leading to further threat to life from recurrent sepsis. Discussed that we have consulted two general surgeons, plastic surgery and a plastic surgeon, Dr. Tressa Busman, [facility] who have all expressed similar plans for management of patient's wound. Patient and husband are not in agreement with the proposed plan of debridement because of the concern of the wound being left open in addition to the wound being larger. Discussed that since they are refusing the current treatment plan, and since patient stable on oral antibiotics, that a safe discharge would be to discharge to SNF, not home, as patient is total care. Husband expressed that he would not agree to SNF at that time and that he wanted her discharge home to take her to [facility] himself. Advised that the discharge will be to SNF, however, if he disagreed with that discharge plan, he would have to sign out against medical advice. He expressed that he would not be signing out against medical advice. Unfortunately, our conversation was not as fruitful as I would have liked (and I am sure as they would have liked), however, taking into consideration that she is currently stable, she is not appropriate for continued hospital care, unless they agree to debridement/further management of her wound.  This discussion was in the presence of the patient's nurse, patient and patient's husband.  Cordelia Poche, MD [facility]

## 2018-02-24 NOTE — Progress Notes (Addendum)
While turning patient at 1820, pt's purewick was changed. After NT Hunter removed dirty purewick from pt's vaginal area, purewick was quickly disposed and Yong Channel took off his dirty gloves to apply clean gloves for remainder of pt care. Pt's husband said "You better wash your hands." Husband repeated 4 times, "You better wash your hands" to NT [facility]. NT Hunter explained that it is not protocol to wash hands in between patient care when hands were not soiled- clean gloves were applied before any other care was done. NT Hunter changed his gloves a total of 4 times during patient care and turning pt. This RN was present in the room for this entire exchange. After cleaning patient and turning her, this RN left the room and NT Hunter carried her food tray into the room after removing soiled gloves. According to NT [facility], the husband then took pt's tray and said "I'll take it from here. You don't wash your hands enough for me."

## 2018-02-24 NOTE — Progress Notes (Signed)
Faxed paperwork to [facility] for Medicare Appeal. Did not receive fax confirmation with Case ID. Will have NCM follow on Monday if any additional paperwork needed.  Jonnie Finner RN CCM Case Mgmt phone [phone]

## 2018-02-24 NOTE — Progress Notes (Signed)
Patient refused to be turned. Writer and NT repositioned pillow under head, checked for purewick placement and adjusted blankets over patient.

## 2018-02-24 NOTE — Progress Notes (Signed)
At 1513 while this RN and NT Hunter were turning patient, bed pads were noticed to be saturated by wound drainage. While pt was being turned, MD called this RN. Pt requested to stay lying on her side "for a few minutes" to attempt to have a BM, so RN stepped into hall to answer phone call. Husband was then seen standing up off of couch in room, taking a picture of what appeared to be possibly patient's wound with NT in picture also. Husband was holding the phone camera high and NT felt as though he was included in the picture. NT was assisting to hold patient turned on her side at the time.  AC was notified and spoke with husband about policies regarding not taking pictures of staff. Husband stated he was taking pictures of pt's wound, not staff.  When this RN currently went back into pt's room to ask if she needed assistance ordering dinner, husband was on the phone asking for the "CEO," speaking about "reporting" his wife's nurse for "being vindictive" and for "reporting him to the charge nurse for taking pictures."

## 2018-02-24 NOTE — Progress Notes (Signed)
CSW was asked by CM to speak with patient and husband again regarding SNF d/c. While speaking yesterday, patient's husband was not agreeable to SNF because he felt she needed care in a hospital setting. He expressed that he would even take her to another hospital's ED.   Today, patient's husband is agreeable to SNF at d/c. CSW explained that patient must be seen by PT to get a recommendation for SNF from them. Patient had previously denied PT three consecutive days. Patient's husband does not agree that patient can participate in any kind of PT currently but seemingly understood that patient needs at least an eval from them. CSW told him that it is not expected for her to ambulate but to not refuse completely.  CSW asked MD to reorder PT eval. CSW will fax out referrals for SNF when therapy notes are available.  Social work will continue to follow for discharge planning.     Pricilla Holm, MSW, [place] Social Work [phone]

## 2018-02-24 NOTE — Progress Notes (Signed)
CM currently in patient's room discussing discharge and plan of care. This RN entered room with CM to be involved in care plan, but pt's husband quickly asked this RN to leave. RN attempted to explain that as her bedside RN, it is important I am involved in care plan and know what is going on. Pt's husband replied "No you can leave. It's my right." And pointed towards the door.

## 2018-02-24 NOTE — Progress Notes (Signed)
CM Alseia notified that pt and husband are appealing discharge. Will continue to follow for any needs.

## 2018-02-25 LAB — CULTURE, BLOOD (ROUTINE X 2)
CULTURE: NO GROWTH
Culture: NO GROWTH
SPECIAL REQUESTS: ADEQUATE

## 2018-02-25 LAB — GLUCOSE, CAPILLARY
GLUCOSE-CAPILLARY: 185 mg/dL — AB (ref 70–99)
GLUCOSE-CAPILLARY: 163 mg/dL — AB (ref 70–99)
GLUCOSE-CAPILLARY: 174 mg/dL — AB (ref 70–99)
Glucose-Capillary: 168 mg/dL — ABNORMAL HIGH (ref 70–99)

## 2018-02-25 LAB — CBC
HEMATOCRIT: 25.5 % — AB (ref 36.0–46.0)
Hemoglobin: 8.1 g/dL — ABNORMAL LOW (ref 12.0–15.0)
MCH: 28.6 pg (ref 26.0–34.0)
MCHC: 31.8 g/dL (ref 30.0–36.0)
MCV: 90.1 fL (ref 78.0–100.0)
Platelets: 532 10*3/uL — ABNORMAL HIGH (ref 150–400)
RBC: 2.83 MIL/uL — AB (ref 3.87–5.11)
RDW: 19 % — AB (ref 11.5–15.5)
WBC: 10.1 10*3/uL (ref 4.0–10.5)

## 2018-02-25 MED ORDER — JUVEN PO PACK
1.0000 | PACK | Freq: Two times a day (BID) | ORAL | Status: DC
  Administered 2018-02-25: 1 via ORAL
  Filled 2018-02-25 (×3): qty 1

## 2018-02-25 NOTE — Progress Notes (Signed)
Writer checked on patient to follow up with pain and comfort, patient is currently sleeping peacefully. Will continue to monitor.

## 2018-02-25 NOTE — Progress Notes (Signed)
Nutrition Follow-up  DOCUMENTATION CODES:   Morbid obesity  INTERVENTION:    Ensure Enlive po BID, each supplement provides 350 kcal and 20 grams of protein  Magic cup once daily with meals, each supplement provides 290 kcal and 9 grams of protein  1 packet Juven BID, each packet provides 80 calories, 8 grams of carbohydrate, and 14 grams of amino acids; supplement contains CaHMB, glutamine, and arginine, to promote wound healing  NUTRITION DIAGNOSIS:   Increased nutrient needs related to wound healing, acute illness as evidenced by estimated needs.  Ongoing  GOAL:   Patient will meet greater than or equal to 90% of their needs  Progressing  MONITOR:   PO intake, Supplement acceptance, Weight trends, Labs, Skin  REASON FOR ASSESSMENT:   Consult Assessment of nutrition requirement/status  ASSESSMENT:   62 y.o. 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 She presented with altered mental status found to be in septic shock.   Per MD septic shock resolved, and wound continues to worsen. RD to order Juven to promote wound healing, pt amendable.   Meal completions charted as 50-100% for her last eight meals. Pt endorses having a good appetite. Denies any nausea. Pt has Ensure BID currently ordered but can only manage to consume one/day. RD encouraged intake of all supplements to meet increased protein needs.   A recent wt has not been obtained since last RD visit. Will attempt to obtain new weight.   Medications reviewed and include: Lantuc, Novolin, MVI with minerals, Vit B12, Vit E Labs reviewed: Na 132 (L)   Diet Order:   Diet Order            Diet heart healthy/carb modified Room service appropriate? Yes; Fluid consistency: Thin  Diet effective now              EDUCATION NEEDS:   No education needs have been identified at this  time  Skin:  Skin Assessment: Skin Integrity Issues: Skin Integrity Issues:: Unstageable, DTI DTI: L heel Unstageable: full thickness to sacrum, along vertebral column, and to L 2nd toe  Last BM:  02/17/18  Height:   Ht Readings from Last 1 Encounters:  02/10/18 5\' 6"  (1.676 m)    Weight:   Wt Readings from Last 1 Encounters:  02/10/18 112 kg    Ideal Body Weight:  59.09 kg  BMI:  Body mass index is 39.85 kg/m.  Estimated Nutritional Needs:   Kcal:  8127-5170 (20-22 kcal/kg)  Protein:  125-135 grams  Fluid:  >/= 2 L/day   Mariana Single RD, LDN Clinical Nutrition Pager # - [phone]

## 2018-02-25 NOTE — Progress Notes (Addendum)
PROGRESS NOTE    Natalie Macdonald  UXN:[id number] DOB: 12-09-1955 DOA: 02/10/2018 PCP: Clifton Custard, MD   Brief Narrative: Natalie Macdonald is a 62 y.o. 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 She presented with altered mental status found to have septic shock. MRI significant for osteomyelitis. Patient declining debridement of her necrotic sacral wound.   Assessment & Plan:  Principal Problem:   Septic shock ([place]) Active Problems:   Diabetes mellitus type 2 with complications ([place])   Left hemiplegia ([place])   Sacral decubitus ulcer, stage III ([facility])   AKI (acute kidney injury) ([place])   Toxic encephalopathy   Septic shock Patient received 3.5 L in the ED with improvement in blood pressure. Blood pressure down again now. Associated kidney injury. Source is wound vs urinary. Leukocytosis. Patient received 8 L of IV fluids. Repeat lactic acid is normal. Urine culture significant for yeast and blood cultures significant for Bacteroides fragilis and coagulase negative staph. -Continue antibiotics  Bacteremia Bacteroides fragilis. In setting of sacral ulcer. -Continue Doxycycline/Flagyl (plan for 6 weeks in setting of osteomyelitis)  Delirium In setting of septic shock. Resolved.  Pressure ulcers Multiple; unstageable sacral, unstageable left posterior shoulder, unstageable left outer thigh, left 2nd toe full thickness wound, left heel deep tissue injury. MRI significant for mild osteomyelitis. General surgery recommending debridement for which the patient refuses secondary to a resultant larger and open wound. Consulted plastic surgery who agrees with general surgery management plan. Attempted transfer to [facility] which was declined secondary to consistent plan and patient is still not in agreement. Discussed with patient and husband that this wound will continue  to worsen and serve as a source for infection. They have expressed full understanding of the situation. They understand that this wound may become life threatening. Wound continues to worsen. WBC stable. Afebrile. 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 Antibiotics as mentioned above.  Acute kidney injury In setting of septic shock. Patient with good urine output. Resolved.  Diabetes mellitus, type 2 Uncontrolled with hyperglycemia at home and hypoglycemia during admission. Hypoglycemia improved. -Continue Lantus 10 units -Continue SSI (qAC/HS)  Hypokalemia Resolved.  Anemia Worsened slightly in setting of her wound. No obvious bleeding. Continues to be stable. -Transfuse for hemoglobin <7 or if symptomatic -FOBT  Left hemiplegia Chronic. Baseline.  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   DVT prophylaxis: Lovenox Code Status:   Code Status: Full Code Family Communication: None at bedside Disposition Plan: Discharge to SNF when bed available. In setting of patient's desires, patient is stable for discharge from the hospital to continue oral antibiotics. Optimally, patient would obtain debridement prior to discharge for maximal treatment of her wound/sepsis source. Patient/husband initially declined SNF discharge. Patient declining discharge home. Now agreeable to SNF discharge.   Consultants:   General surgery  Plastic surgery  Infectious disease  Procedures:   None  Antimicrobials:  Vancomycin  Zosyn  Doxycycline  Flagyl   Subjective: Patient reports no issues overnight. States that she did not decline to be  turned.  Objective: Vitals:   02/24/18 1311 02/24/18 2033 02/25/18 0524 02/25/18 1403  BP:  108/69 117/68 121/70 115/63  Pulse: 87 92 97 89  Resp: 19 17 18 18   Temp: 98.2 F (36.8 C) 99.5 F (37.5 C) 98.4 F (36.9 C) 98.1 F (36.7 C)  TempSrc: Oral Oral Oral Oral  SpO2: 100% 98% 99% 98%  Weight:      Height:        Intake/Output Summary (Last 24 hours) at 02/25/2018 1538 Last data filed at 02/25/2018 1300 Gross per 24 hour  Intake 480 ml  Output 1250 ml  Net -770 ml   Filed Weights   02/10/18 0427  Weight: 112 kg    Examination:  General exam: Appears calm and comfortable Respiratory system: Clear to auscultation. Respiratory effort normal. Cardiovascular system: S1 & S2 heard, RRR. No murmurs, rubs, gallops or clicks. Gastrointestinal system: Abdomen is nondistended, soft and nontender. Normal bowel sounds heard. Central nervous system: Alert and oriented. No focal neurological deficits. Extremities: Edema. No calf tenderness Skin: No cyanosis. No rashes Psychiatry: Judgement and insight appear normal. Mood & affect appropriate.     Data Reviewed: I have personally reviewed following labs and imaging studies  CBC: Recent Labs  Lab 02/19/18 0558 02/20/18 0610 02/23/18 1244 02/24/18 1116 02/25/18 0807  WBC 14.5* 11.6* 9.0 9.0 10.1  NEUTROABS 9.5* 6.4  --   --   --   HGB 8.4* 8.2* 7.8* 7.7* 8.1*  HCT 26.0* 25.9* 24.7* 23.6* 25.5*  MCV 86.4 88.1 88.8 89.1 90.1  PLT 466* 489* 560* 498* 676*   Basic Metabolic Panel: Recent Labs  Lab 02/19/18 0558 02/20/18 0610 02/23/18 1244  NA 135 135 132*  K 3.8 4.2 3.8  CL 98 98 96*  CO2 28 25 29   GLUCOSE 173* 161* 165*  BUN 8 8 7*  CREATININE 0.85 0.85 0.71  CALCIUM 8.0* 8.3* 8.5*   GFR: Estimated Creatinine Clearance: 91.4 mL/min (by C-G formula based on SCr of 0.71 mg/dL). Liver Function Tests: No results for input(s): AST, ALT, ALKPHOS, BILITOT, PROT, ALBUMIN in the last 168 hours. No results for input(s):  LIPASE, AMYLASE in the last 168 hours. No results for input(s): AMMONIA in the last 168 hours. Coagulation Profile: No results for input(s): INR, PROTIME in the last 168 hours. Cardiac Enzymes: No results for input(s): CKTOTAL, CKMB, CKMBINDEX, TROPONINI in the last 168 hours. BNP (last 3 results) No results for input(s): PROBNP in the last 8760 hours. HbA1C: No results for input(s): HGBA1C in the last 72 hours. CBG: Recent Labs  Lab 02/24/18 1135 02/24/18 1650 02/24/18 2033 02/25/18 0806 02/25/18 1207  GLUCAP 231* 212* 172* 168* 185*   Lipid Profile: No results for input(s): CHOL, HDL, LDLCALC, TRIG, CHOLHDL, LDLDIRECT in the last 72 hours. Thyroid Function Tests: No results for input(s): TSH, T4TOTAL, FREET4, T3FREE, THYROIDAB in the last 72 hours. Anemia Panel: No results for input(s): VITAMINB12, FOLATE, FERRITIN, TIBC, IRON, RETICCTPCT in the last 72 hours. Sepsis Labs: No results for input(s): PROCALCITON, LATICACIDVEN in the last 168 hours.  Recent Results (from the past 240 hour(s))  Culture, blood (routine x 2)     Status: None   Collection Time: 02/20/18  3:42 PM  Result Value Ref Range Status   Specimen Description   Final    BLOOD LEFT ANTECUBITAL Performed at [facility] [address], [place], [place] [address]    Special Requests   Final    BOTTLES DRAWN AEROBIC AND ANAEROBIC Blood Culture adequate volume Performed at [facility] [address] [place],  [place] [address]    Culture   Final    NO GROWTH 5 DAYS Performed at [facility], [place] [address] [place], [place] [address]    Report Status 02/25/2018 FINAL  Final  Culture, blood (routine x 2)     Status: None   Collection Time: 02/20/18  3:43 PM  Result Value Ref Range Status   Specimen Description   Final    BLOOD LEFT HAND Performed at [facility] [address], [place], [place] [address]    Special Requests   Final     BOTTLES DRAWN AEROBIC ONLY Blood Culture results may not be optimal due to an inadequate volume of blood received in culture bottles Performed at [facility] [address] [place], [place] [address]    Culture   Final    NO GROWTH 5 DAYS Performed at [facility], [place] [address] [place], [place] [address]    Report Status 02/25/2018 FINAL  Final         Radiology Studies: No results found.      Scheduled Meds: . amitriptyline  100 mg Oral QHS  . carvedilol  25 mg Oral BID WC  . collagenase   Topical Daily  . doxycycline  100 mg Oral Q12H  . enoxaparin (LOVENOX) injection  40 mg Subcutaneous Q24H  . feeding supplement (ENSURE ENLIVE)  237 mL Oral BID BM  . gabapentin  300 mg Oral TID  . insulin glargine  10 Units Subcutaneous Daily  . insulin regular  0-5 Units Subcutaneous QHS  . insulin regular  0-9 Units Subcutaneous TID WC  . insulin regular  3 Units Subcutaneous TID WC  . levothyroxine  150 mcg Oral QAC breakfast  . magnesium hydroxide  5 mL Oral Daily  . metroNIDAZOLE  500 mg Oral Q8H  . morphine  30 mg Oral Q12H  . multivitamin with minerals  1 tablet Oral Daily  . mupirocin cream   Topical Daily  . nutrition supplement (JUVEN)  1 packet Oral BID BM  . oxybutynin  15 mg Oral Daily  . pantoprazole  40 mg Oral BID  . polyethylene glycol  17 g Oral BID  . pravastatin  20 mg Oral Daily  . vitamin B-6  250 mg Oral Daily  . senna-docusate  1 tablet Oral BID  . sucralfate  1 g Oral TID WC & HS  . vitamin B-12  1,000 mcg Oral Daily  . vitamin E  400 Units Oral Daily   Continuous Infusions: . sodium chloride    . sodium chloride Stopped (02/22/18 1033)     LOS: 15 days     Cordelia Poche, MD [facility] 02/25/2018, 3:38 PM Pager: [phone]  If 7PM-7AM, please contact night-coverage [email or web address] 02/25/2018, 3:38 PM

## 2018-02-25 NOTE — Evaluation (Signed)
Physical Therapy Evaluation Patient Details Name: Georgianne Gritz MRN: [id number] DOB: 12/28/1955 Today's Date: 02/25/2018   History of Present Illness  62 y.o. 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 She presented 02/10/18 with altered mental status found to have septic shock.  Clinical Impression  Patient reports that wound pain is too great to attempt sitting on bed edge. Patient did participate in rolling and repositioning. Currently has large open wound on sacrum that is painful. Sitting in a WC would not be indicated at this time due to need for pressure relief( on air mattress). Goals for skilled PT are limited as long as wound is open and painful. Continue trial of PT while in  Hospital for mobility assessment.     Follow Up Recommendations SNF    Equipment Recommendations  None recommended by PT    Recommendations for Other Services       Precautions / Restrictions Precautions Precaution Comments: sacral wound  Restrictions Weight Bearing Restrictions: No      Mobility  Bed Mobility Overal bed mobility: Needs Assistance Bed Mobility: Rolling Rolling: Mod assist;Max assist         General bed mobility comments: patient uses rails to roll to left, able to move leg over  right to facilitate rolling. to rooll to the right, requires assistance for trunk and left leg.   Transfers Overall transfer level: Needs assistance               General transfer comment: patient declined to sit at bed edge due to pain from wound.  Ambulation/Gait                Stairs            Wheelchair Mobility    Modified Rankin (Stroke Patients Only)       Balance                                             Pertinent Vitals/Pain Faces Pain Scale: Hurts whole lot Pain Location: buttocks Pain Descriptors / Indicators:  Grimacing;Discomfort Pain Intervention(s): Monitored during session;Premedicated before session    Home Living Family/patient expects to be discharged to:: Private residence Living Arrangements: Spouse/significant other;Children   Type of Home: House         Home Equipment: Environmental consultant - 2 wheels;Walker - 4 wheels;Wheelchair - manual Additional Comments: [employer] fast streamer    Prior Function Level of Independence: Needs assistance   Gait / Transfers Assistance Needed: per pt. spouse and daughter perform standpivot  transfer. has not been up much due to pain from wound.   ADL's / Homemaking Assistance Needed: requires assist for all ADLs   Comments: patient states that she was in [facility] out shopping 1 week prior to Admission.      Hand Dominance        Extremity/Trunk Assessment   Upper Extremity Assessment Upper Extremity Assessment: Defer to OT evaluation    Lower Extremity Assessment Lower Extremity Assessment: RLE deficits/detail RLE Deficits / Details: lifts leg from bed, flexes knee LLE Deficits / Details: trace foot movement, 1/5 knee flexion       Communication      Cognition Arousal/Alertness: Awake/alert Behavior During Therapy: Flat affect Overall Cognitive Status: Difficult to assess  General Comments      Exercises General Exercises - Lower Extremity Heel Slides: AROM;AAROM;Both;10 reps;Supine Hip ABduction/ADduction: AROM;Both;10 reps;Supine   Assessment/Plan    PT Assessment Patient needs continued PT services  PT Problem List Decreased strength;Decreased range of motion;Decreased activity tolerance;Obesity;Decreased safety awareness;Decreased skin integrity;Decreased balance;Pain;Decreased mobility;Decreased knowledge of precautions       PT Treatment Interventions Functional mobility training;Therapeutic activities;Patient/family education;Therapeutic exercise    PT Goals (Current goals can be found  in the Care Plan section)  Acute Rehab PT Goals Patient Stated Goal: to not have pain, PT Goal Formulation: With patient Time For Goal Achievement: 03/11/18 Potential to Achieve Goals: Fair    Frequency Min 2X/week   Barriers to discharge        Co-evaluation               AM-PAC PT "6 Clicks" Daily Activity  Outcome Measure Difficulty turning over in bed (including adjusting bedclothes, sheets and blankets)?: Unable Difficulty moving from lying on back to sitting on the side of the bed? : Unable Difficulty sitting down on and standing up from a chair with arms (e.g., wheelchair, bedside commode, etc,.)?: Unable Help needed moving to and from a bed to chair (including a wheelchair)?: Total Help needed walking in hospital room?: Total Help needed climbing 3-5 steps with a railing? : Total 6 Click Score: 6    End of Session   Activity Tolerance: Patient limited by pain Patient left: in bed;with call bell/phone within reach Nurse Communication: Mobility status;Need for lift equipment PT Visit Diagnosis: Pain;Other symptoms and signs involving the nervous system (R29.898)    Time: 7564-3329 PT Time Calculation (min) (ACUTE ONLY): 20 min   Charges:   PT Evaluation $PT Eval Low Complexity: 1 Low          [place] [facility] [phone]   Claretha Cooper 02/25/2018, 11:34 AM

## 2018-02-25 NOTE — Progress Notes (Signed)
Patient refused to be turned throughout the night. Writer and NT just gave patient a bedbath, changed dressings and turned patient. Patient reported pain of 8 near buttocks wound at this time, writer medicated patient per her request. Will continue to monitor.

## 2018-02-26 LAB — BASIC METABOLIC PANEL
Anion gap: 8 (ref 5–15)
BUN: 10 mg/dL (ref 8–23)
CALCIUM: 8.5 mg/dL — AB (ref 8.9–10.3)
CHLORIDE: 97 mmol/L — AB (ref 98–111)
CO2: 28 mmol/L (ref 22–32)
CREATININE: 0.73 mg/dL (ref 0.44–1.00)
GFR calc non Af Amer: >60 mL/min (ref >60)
Glucose, Bld: 227 mg/dL — ABNORMAL HIGH (ref 70–99)
Potassium: 3.8 mmol/L (ref 3.5–5.1)
SODIUM: 133 mmol/L — AB (ref 135–145)

## 2018-02-26 LAB — GLUCOSE, CAPILLARY
GLUCOSE-CAPILLARY: 239 mg/dL — AB (ref 70–99)
GLUCOSE-CAPILLARY: 189 mg/dL — AB (ref 70–99)
Glucose-Capillary: 241 mg/dL — ABNORMAL HIGH (ref 70–99)

## 2018-02-26 LAB — CBC
HCT: 24.4 % — ABNORMAL LOW (ref 36.0–46.0)
Hemoglobin: 8 g/dL — ABNORMAL LOW (ref 12.0–15.0)
MCH: 29 pg (ref 26.0–34.0)
MCHC: 32.8 g/dL (ref 30.0–36.0)
MCV: 88.4 fL (ref 78.0–100.0)
PLATELETS: 528 10*3/uL — AB (ref 150–400)
RBC: 2.76 MIL/uL — AB (ref 3.87–5.11)
RDW: 19.1 % — ABNORMAL HIGH (ref 11.5–15.5)
WBC: 8.5 10*3/uL (ref 4.0–10.5)

## 2018-02-26 NOTE — Progress Notes (Signed)
CSW followed up with patient at bedside regarding SNF selection and informed patient that CSW was unable to reach patient's husband by phone. Patient declined to make SNF selection without husband present and reported that her husband was in route to the hospital. CSW agreed to follow up with patient/patient's husband regarding SNF selection.  CSW will continue to follow and assist with discharge planning.  Abundio Miu, [place] Social Worker [facility] Cell#: [phone]

## 2018-02-26 NOTE — Care Management Note (Signed)
Case Management Note  Patient Details  Name: Natalie Macdonald MRN: [id number] Date of Birth: Mar 23, 1956  Subjective/Objective: Received call from [place] about medicare appeal-spoke to [facility] rep-[phone]-hospital has until 2p to secure a SNF facility that has accepted patient or the case will be closed, & it will be a technicality on the hospital's behalf to pay ongoing for the patient's hospital stay-CSW-aware to contact Gerhard Perches w/update on facility before 2p/MD/Nsg/Administration/Med Director aware.                    Action/Plan:d/c plan SNF.   Expected Discharge Date:  02/23/18               Expected Discharge Plan:  [place]  In-House Referral:  Clinical Social Work  Discharge planning Services  CM Consult  Post Acute Care Choice:    Choice offered to:     DME Arranged:  [place] DME Agency:  [place]:    [facility] Agency:     Status of Service:  Completed, signed off  If discussed at [employer] of Stay Meetings, dates discussed:    Additional Comments:  Dessa Phi, RN 02/26/2018, 11:27 AM

## 2018-02-26 NOTE — Progress Notes (Signed)
CSW spoke with patient at the bedside regarding discharge planning to SNF. Patient confirmed plan to discharge to SNF. CSW provided patient with SNF options. Patient granted CSW verbal permission to contact her husband Emma Birchler [phone]) to provide SNF options and get SNF selection.  CSW contacted patient's husband Caitlan Chauca), no answer. CSW left voicemail requesting return phone call.   CSW needs SNF selection, so SNF can initiate insurance authorization.  CSW will continue to follow and assist with discharge planning.  Abundio Miu, [place] Social Worker [facility] Cell#: [phone]

## 2018-02-26 NOTE — Progress Notes (Signed)
PROGRESS NOTE    Natalie Macdonald  FBP:[id number] DOB: Feb 22, 1956 DOA: 02/10/2018 PCP: Clifton Custard, MD   Brief Narrative: Natalie Macdonald is a 62 y.o. 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 She presented with altered mental status found to have septic shock. MRI significant for osteomyelitis. Patient declining debridement of her necrotic sacral wound.   Assessment & Plan:  Principal Problem:   Septic shock ([place]) Active Problems:   Diabetes mellitus type 2 with complications ([place])   Left hemiplegia ([place])   Sacral decubitus ulcer, stage III (HCC)   AKI (acute kidney injury) ([place])   Toxic encephalopathy   Septic shock Patient received 3.5 L in the ED with improvement in blood pressure. Blood pressure down again now. Associated kidney injury. Source is wound vs urinary. Leukocytosis. Patient received 8 L of IV fluids. Repeat lactic acid is normal. Urine culture significant for yeast and blood cultures significant for Bacteroides fragilis and coagulase negative staph. -Continue antibiotics  Bacteremia Bacteroides fragilis. In setting of sacral ulcer. -Continue Doxycycline/Flagyl (plan for 6 weeks in setting of osteomyelitis)  Delirium In setting of septic shock. Resolved.  Pressure ulcers Multiple; unstageable sacral, unstageable left posterior shoulder, unstageable left outer thigh, left 2nd toe full thickness wound, left heel deep tissue injury. MRI significant for mild osteomyelitis. General surgery recommending debridement for which the patient refuses secondary to a resultant larger and open wound. Consulted plastic surgery who agrees with general surgery management plan. Attempted transfer to [facility] which was declined secondary to consistent plan and patient is still not in agreement. Discussed with patient and husband that this wound will continue  to worsen and serve as a source for infection. They have expressed full understanding of the situation. They understand that this wound may become life threatening. Wound continues to worsen. WBC stable. Afebrile. 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 Antibiotics as mentioned above.  Acute kidney injury In setting of septic shock. Patient with good urine output. Resolved.  Diabetes mellitus, type 2 Uncontrolled with hyperglycemia at home and hypoglycemia during admission. Hypoglycemia improved. -Continue Lantus 10 units -Continue SSI (qAC/HS)  Hypokalemia Resolved.  Anemia Worsened slightly in setting of her wound. No obvious bleeding. Continues to be stable. -Transfuse for hemoglobin <7 or if symptomatic -FOBT pending  Left hemiplegia Chronic. Baseline. In setting of CVA as mentioned below.  Nephrolithiasis Non-obstructing right 4 mm stone in lower pole  Hypothyroidism -Continue Synthroid  Chronic pain Continue home MS contin and oxycodone  Constipation Patient refusing Miralax, Milk of mag, suppositories and enema. Discussed importance of managing constipation in setting of narcotic usage. 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 Does not appear related to cervical surgery per chart review.   DVT prophylaxis: Lovenox Code Status:   Code Status: Full Code Family Communication: None at bedside Disposition Plan: Discharge to SNF when bed available. In setting of patient's desires, patient is stable for discharge from the hospital to continue oral antibiotics. Optimally, patient would obtain debridement prior to discharge for maximal treatment of her wound/sepsis source. Patient/husband initially declined SNF discharge. Patient declining discharge home. Now agreeable to SNF  discharge.   Consultants:   General surgery  Plastic surgery  Infectious disease  Procedures:   None  Antimicrobials:  Vancomycin  Zosyn  Doxycycline  Flagyl   Subjective: No issues overnight. No bowel movement.  Objective: Vitals:   02/25/18 1403 02/25/18 2127 02/26/18 0416 02/26/18 0749  BP: 115/63 112/69 (!) 105/54 (!) 97/55  Pulse: 89 93 (!) 101 96  Resp: 18 18 18 20   Temp: 98.1 F (36.7 C) 99.4 F (37.4 C) 98.3 F (36.8 C) 99.4 F (37.4 C)  TempSrc: Oral Oral Oral Oral  SpO2: 98% 97% 94%   Weight:      Height:        Intake/Output Summary (Last 24 hours) at 02/26/2018 1204 Last data filed at 02/26/2018 0930 Gross per 24 hour  Intake 900 ml  Output 1550 ml  Net -650 ml   Filed Weights   02/10/18 0427  Weight: 112 kg    Examination:  General exam: Appears calm and comfortable Respiratory system: Clear to auscultation. Respiratory effort normal. Cardiovascular system: S1 & S2 heard, RRR. No murmurs, rubs, gallops or clicks. Gastrointestinal system: Abdomen is nondistended, soft and nontender. Normal bowel sounds heard. Central nervous system: Alert and oriented. No focal neurological deficits. Extremities: LE edema. No calf tenderness Skin: No cyanosis Psychiatry: Judgement and insight appear normal. Flat affect and depressed mood   Data Reviewed: I have personally reviewed following labs and imaging studies  CBC: Recent Labs  Lab 02/20/18 0610 02/23/18 1244 02/24/18 1116 02/25/18 0807 02/26/18 0859  WBC 11.6* 9.0 9.0 10.1 8.5  NEUTROABS 6.4  --   --   --   --   HGB 8.2* 7.8* 7.7* 8.1* 8.0*  HCT 25.9* 24.7* 23.6* 25.5* 24.4*  MCV 88.1 88.8 89.1 90.1 88.4  PLT 489* 560* 498* 532* 350*   Basic Metabolic Panel: Recent Labs  Lab 02/20/18 0610 02/23/18 1244 02/26/18 0859  NA 135 132* 133*  K 4.2 3.8 3.8  CL 98 96* 97*  CO2 25 29 28   GLUCOSE 161* 165* 227*  BUN 8 7* 10  CREATININE 0.85 0.71 0.73  CALCIUM 8.3* 8.5* 8.5*    GFR: Estimated Creatinine Clearance: 91.4 mL/min (by C-G formula based on SCr of 0.73 mg/dL). Liver Function Tests: No results for input(s): AST, ALT, ALKPHOS, BILITOT, PROT, ALBUMIN in the last 168 hours. No results for input(s): LIPASE, AMYLASE in the last 168 hours. No results for input(s): AMMONIA in the last 168 hours. Coagulation Profile: No results for input(s): INR, PROTIME in the last 168 hours. Cardiac Enzymes: No results for input(s): CKTOTAL, CKMB, CKMBINDEX, TROPONINI in the last 168 hours. BNP (last 3 results) No results for input(s): PROBNP in the last 8760 hours. HbA1C: No results for input(s): HGBA1C in the last 72 hours. CBG: Recent Labs  Lab 02/25/18 1207 02/25/18 1641 02/25/18 2126 02/26/18 0728 02/26/18 1132  GLUCAP 185* 163* 174* 241* 239*   Lipid Profile: No results for input(s): CHOL, HDL, LDLCALC, TRIG, CHOLHDL, LDLDIRECT in the last 72 hours. Thyroid Function Tests: No results for input(s): TSH, T4TOTAL, FREET4, T3FREE, THYROIDAB in the last 72 hours. Anemia Panel: No results for input(s): VITAMINB12, FOLATE, FERRITIN, TIBC, IRON, RETICCTPCT in the last 72 hours. Sepsis Labs: No results for input(s): PROCALCITON, LATICACIDVEN in the last 168 hours.  Recent Results (from the past 240 hour(s))  Culture, blood (routine x 2)     Status: None   Collection Time: 02/20/18  3:42 PM  Result Value Ref Range Status   Specimen Description   Final    BLOOD LEFT ANTECUBITAL Performed at [facility] [address] [place], [place]  [address]    Special Requests   Final    BOTTLES DRAWN AEROBIC AND ANAEROBIC Blood Culture adequate volume Performed at [facility] [address] [place], [place] [address]    Culture   Final    NO GROWTH 5 DAYS Performed at [facility], [place] [address] [place], [place] [address]    Report Status 02/25/2018 FINAL  Final  Culture, blood (routine x 2)     Status: None    Collection Time: 02/20/18  3:43 PM  Result Value Ref Range Status   Specimen Description   Final    BLOOD LEFT HAND Performed at [facility] [address], [place], [place] [address]    Special Requests   Final    BOTTLES DRAWN AEROBIC ONLY Blood Culture results may not be optimal due to an inadequate volume of blood received in culture bottles Performed at [facility] [address] [place], [place] [address]    Culture   Final    NO GROWTH 5 DAYS Performed at [facility], [place] [address], [place], [place] [address]    Report Status 02/25/2018 FINAL  Final         Radiology Studies: No results found.      Scheduled Meds: . amitriptyline  100 mg Oral QHS  . carvedilol  25 mg Oral BID WC  . collagenase   Topical Daily  . doxycycline  100 mg Oral Q12H  . enoxaparin (LOVENOX) injection  40 mg Subcutaneous Q24H  . feeding supplement (ENSURE ENLIVE)  237 mL Oral BID BM  . gabapentin  300 mg Oral TID  . insulin glargine  10 Units Subcutaneous Daily  . insulin regular  0-5 Units Subcutaneous QHS  . insulin regular  0-9 Units Subcutaneous TID WC  . insulin regular  3 Units Subcutaneous TID WC  . levothyroxine  150 mcg Oral QAC breakfast  . magnesium hydroxide  5 mL Oral Daily  . metroNIDAZOLE  500 mg Oral Q8H  . morphine  30 mg Oral Q12H  . multivitamin with minerals  1 tablet Oral Daily  . mupirocin cream   Topical Daily  . nutrition supplement (JUVEN)  1 packet Oral BID BM  . oxybutynin  15 mg Oral Daily  . pantoprazole  40 mg Oral BID  . polyethylene glycol  17 g Oral BID  . pravastatin  20 mg Oral Daily  . vitamin B-6  250 mg Oral Daily  . senna-docusate  1 tablet Oral BID  . sucralfate  1 g Oral TID WC & HS  . vitamin B-12  1,000 mcg Oral Daily  . vitamin E  400 Units Oral Daily   Continuous Infusions: . sodium chloride    . sodium chloride Stopped (02/22/18 1033)     LOS: 16 days     Cordelia Poche,  MD [facility] 02/26/2018, 12:04 PM Pager: [phone]  If 7PM-7AM, please contact night-coverage [email or web address] 02/26/2018, 12:04 PM

## 2018-02-26 NOTE — Progress Notes (Signed)
CSW spoke with patient's RN who confirmed that patient's husband has not arrived to the hospital.   CSW contacted patient's husband Tyannah Sane [phone]), no answer. CSW left voicemail requesting return phone call.  CSW contacted [employer] rep Eddie Dibbles [phone]) to provide update, no answer. CSW left voicemail requesting return phone call.  Abundio Miu, [place] Social Worker [facility] Cell#: [phone]

## 2018-02-26 NOTE — Progress Notes (Signed)
CSW attempted to contact patient's husband Alexee Delsanto), no answer. CSW left voicemail requesting return phone call.  CSW needs SNF selection to continue with discharge planning process.   Abundio Miu, [place] Social Worker [facility] Cell#: [phone]

## 2018-02-26 NOTE — NC FL2 (Signed)
[place] LEVEL OF CARE SCREENING TOOL     IDENTIFICATION  Patient Name: Natalie Macdonald Birthdate: [birth year] Sex: female Admission Date (Current Location): 02/10/2018  [facility] and [place] Number:  Herbalist and Address:  [facility],  [place], [place]      Provider Number: [id number]  Attending Physician Name and Address:  Mariel Aloe, MD  Relative Name and Phone Number:       Current Level of Care: Hospital Recommended Level of Care: [place] Prior Approval Number:    Date Approved/Denied:   PASRR Number:    Discharge Plan: SNF    Current Diagnoses: Patient Active Problem List   Diagnosis Date Noted  . Diarrhea 01/06/2018  . Sepsis ([place]) 01/03/2018  . Cellulitis 01/03/2018  . Sacral decubitus ulcer, stage III ([place]) 01/03/2018  . AKI (acute kidney injury) ([place]) 01/03/2018  . Dehydration 01/03/2018  . Toxic encephalopathy 01/03/2018  . Adverse drug effect 01/03/2018  . Acute encephalopathy   . Hypoxemia   . Palliative care encounter   . Septic shock ([place])   . Acute respiratory failure with hypoxemia ([place])   . Pressure injury of skin 10/18/2016  . Cervical spondylosis with myelopathy 10/17/2016  . Chronic left-sided low back pain with left-sided sciatica 09/05/2016  . Cervicalgia 09/05/2016  . Diabetes mellitus type 2 with complications ([place]) 16/38/4536  . Hyperlipidemia, unspecified 09/05/2016  . Hypertension 09/05/2016  . Sarcoidosis 09/05/2016  . Cervical disc disorder with myelopathy of mid-cervical region 03/13/2016  . Left hemiplegia ([place]) 02/07/2016  . Chronic osteomyelitis of ankle and foot, left ([place]) 09/25/2014  . Hyponatremia 09/25/2014  . Diabetic ulcer of left foot associated with type 2 diabetes mellitus ([place]) 08/30/2014  . Charcot's joint of foot due to diabetes ([place]) 10/15/2013  . Pain in joint involving ankle and foot 10/15/2013    Orientation RESPIRATION BLADDER  Height & Weight     Self, Time, Situation, Place  Normal Incontinent Weight: 246 lb 14.6 oz (112 kg) Height:  5\' 6"  (167.6 cm)  BEHAVIORAL SYMPTOMS/MOOD NEUROLOGICAL BOWEL NUTRITION STATUS        Diet(see dc summary)  AMBULATORY STATUS COMMUNICATION OF NEEDS Skin   Total Care Verbally Other (Comment)     Deep Tissue Injury - Location: Left Heel (prevalon boots) Pressure Injury (unstageable) - Location: Sacrum (Abdominal pads; gauze, moist to dry changed twice a day) Pressure Injury (unstageable) - Location: Vertebral column (foam dressing every 3 days) Pressure Injury (unstageable) - Location: Toe (foam dressing changed every 3 days) Wound/Incision (non pressure wound) left thigh (foam dressing every changed every 3 days)                   Personal Care Assistance Level of Assistance  Bathing, Feeding, Dressing Bathing Assistance: Maximum assistance Feeding assistance: Independent Dressing Assistance: Maximum assistance     Functional Limitations Info  Sight, Hearing, Speech Sight Info: Adequate Hearing Info: Adequate Speech Info: Adequate    SPECIAL CARE FACTORS FREQUENCY  PT (By licensed PT)     PT Frequency: Min 2x/week              Contractures      Additional Factors Info  Code Status, Allergies Code Status Info: Full Code Allergies Info: Grapefruit Bioflavonoid Complex;Latex;Procaine;Grapefruit Secretary/administrator;Novolog Insulin Aspart           Current Medications (02/26/2018):  This is the current hospital active medication list Current Facility-Administered Medications  Medication Dose  Route Frequency Provider Last Rate Last Dose  . 0.9 %  sodium chloride infusion   Intra-arterial PRN Mariel Aloe, MD      . 0.9 %  sodium chloride infusion   Intravenous PRN Mariel Aloe, MD   Stopped at 02/22/18 1033  . acetaminophen (TYLENOL) tablet 650 mg  650 mg Oral Q6H PRN Mariel Aloe, MD   650 mg at 02/26/18 0531   Or  . acetaminophen (TYLENOL)  suppository 650 mg  650 mg Rectal Q6H PRN Mariel Aloe, MD      . alum & mag hydroxide-simeth (MAALOX/MYLANTA) 200-200-20 MG/5ML suspension 30 mL  30 mL Oral Q4H PRN Mariel Aloe, MD      . amitriptyline (ELAVIL) tablet 100 mg  100 mg Oral QHS Mariel Aloe, MD   100 mg at 02/25/18 2335  . carvedilol (COREG) tablet 25 mg  25 mg Oral BID WC Mariel Aloe, MD   25 mg at 02/25/18 1723  . collagenase (SANTYL) ointment   Topical Daily Mariel Aloe, MD      . diphenhydrAMINE (BENADRYL) capsule 25 mg  25 mg Oral Q4H PRN Schorr, Rhetta Mura, NP   25 mg at 02/15/18 2341  . doxycycline (VIBRA-TABS) tablet 100 mg  100 mg Oral Q12H Mariel Aloe, MD   100 mg at 02/26/18 0909  . enoxaparin (LOVENOX) injection 40 mg  40 mg Subcutaneous Q24H Mariel Aloe, MD   40 mg at 02/25/18 2334  . feeding supplement (ENSURE ENLIVE) (ENSURE ENLIVE) liquid 237 mL  237 mL Oral BID BM Mariel Aloe, MD   237 mL at 02/26/18 0911  . gabapentin (NEURONTIN) capsule 300 mg  300 mg Oral TID Tawni Millers, MD   300 mg at 02/26/18 0908  . insulin glargine (LANTUS) injection 10 Units  10 Units Subcutaneous Daily Tawni Millers, MD   10 Units at 02/26/18 [phone]  . insulin regular (NOVOLIN R,HUMULIN R) 100 units/mL injection 0-5 Units  0-5 Units Subcutaneous QHS Mariel Aloe, MD   2 Units at 02/24/18 2113  . insulin regular (NOVOLIN R,HUMULIN R) 100 units/mL injection 0-9 Units  0-9 Units Subcutaneous TID WC Mariel Aloe, MD   3 Units at 02/26/18 0912  . insulin regular (NOVOLIN R,HUMULIN R) 100 units/mL injection 3 Units  3 Units Subcutaneous TID WC Arrien, Jimmy Picket, MD   3 Units at 02/26/18 0911  . levothyroxine (SYNTHROID, LEVOTHROID) tablet 150 mcg  150 mcg Oral QAC breakfast Mariel Aloe, MD   150 mcg at 02/26/18 0531  . magnesium hydroxide (MILK OF MAGNESIA) suspension 5 mL  5 mL Oral Daily Mariel Aloe, MD   5 mL at 02/25/18 1010  . metroNIDAZOLE (FLAGYL) tablet 500 mg  500 mg  Oral Q8H Mariel Aloe, MD   500 mg at 02/26/18 0531  . morphine (MS CONTIN) 12 hr tablet 30 mg  30 mg Oral Q12H Mariel Aloe, MD   30 mg at 02/26/18 0908  . multivitamin with minerals tablet 1 tablet  1 tablet Oral Daily Mariel Aloe, MD   1 tablet at 02/25/18 1009  . mupirocin cream (BACTROBAN) 2 %   Topical Daily Mariel Aloe, MD      . nutrition supplement (JUVEN) (JUVEN) powder packet 1 packet  1 packet Oral BID BM Mariel Aloe, MD   1 packet at 02/25/18 1232  . ondansetron (ZOFRAN) tablet 4 mg  4 mg  Oral Q6H PRN Mariel Aloe, MD       Or  . ondansetron [facility]) injection 4 mg  4 mg Intravenous Q6H PRN Mariel Aloe, MD   4 mg at 02/19/18 0733  . oxybutynin (DITROPAN XL) 24 hr tablet 15 mg  15 mg Oral Daily Mariel Aloe, MD   15 mg at 02/26/18 0929  . oxyCODONE (Oxy IR/ROXICODONE) immediate release tablet 10 mg  10 mg Oral Q6H PRN Mariel Aloe, MD   10 mg at 02/26/18 0908  . pantoprazole (PROTONIX) EC tablet 40 mg  40 mg Oral BID Tawni Millers, MD   40 mg at 02/26/18 0908  . polyethylene glycol (MIRALAX / GLYCOLAX) packet 17 g  17 g Oral BID Mariel Aloe, MD   17 g at 02/24/18 2108  . pravastatin (PRAVACHOL) tablet 20 mg  20 mg Oral Daily Mariel Aloe, MD   20 mg at 02/26/18 5621  . pyridOXINE (VITAMIN B-6) tablet 250 mg  250 mg Oral Daily Mariel Aloe, MD   250 mg at 02/26/18 3086  . senna-docusate (Senokot-S) tablet 1 tablet  1 tablet Oral BID Mariel Aloe, MD   1 tablet at 02/26/18 5784  . simethicone (MYLICON) chewable tablet 80 mg  80 mg Oral QID PRN Tawni Millers, MD   80 mg at 02/23/18 1152  . sucralfate (CARAFATE) 1 GM/10ML suspension 1 g  1 g Oral TID WC & HS Arrien, Jimmy Picket, MD   1 g at 02/24/18 2108  . vitamin B-12 (CYANOCOBALAMIN) tablet 1,000 mcg  1,000 mcg Oral Daily Mariel Aloe, MD   1,000 mcg at 02/26/18 6962  . vitamin E capsule 400 Units  400 Units Oral Daily Mariel Aloe, MD   400 Units at 02/26/18 9528      Discharge Medications: Please see discharge summary for a list of discharge medications.  Relevant Imaging Results:  Relevant Lab Results:   Additional Information SSN [id number]  Burnis Medin, LCSW

## 2018-02-26 NOTE — Progress Notes (Signed)
CSW received return call from [place] [phone]) and provided update. Rep reported that patient's appeal would continue to be worked.   CSW spoke with patient's husband at bedside and provided bed offers. Patient's husband reported that he does not know if patient will dc to SNF and reported that he feels patient has not been treated for her wounds at the hospital. Patient's husband reported that he is waiting for his daughter to arrive and that he has not made a decision regarding patient's discharge plan. Patient's husband reported that he may just take patient to another hospital. CSW informed patient's husband that CSW is available if they are interested in discharging to SNF.  Patient's discharge plan unknown. CSW will continue to follow and assist with discharge planning.   Abundio Miu, [place] Social Worker [facility] Cell#: [phone]

## 2018-02-26 NOTE — Progress Notes (Signed)
At 6:40pm RN and NT gave patient a full bed bath and RN performed BID wet to dry dressing change on sacral wound. RN exited the room and the husband came out at [place] and asked RN if she knew what the discharge plan was for the patient. RN stated to the husband that the hospital staff are waiting on he and his wife and daughter to make a decision on which [place] (SNF) so that the insurance process could be started. RN stated that the Clinical Social Worker (CSW)  has made multiple attempts to contact him today. Husband states that the CSW came to the bedside to discuss SNF choice and he told her that he was waiting on his daughter to arrive to make any decisions. Husband stated that the daughter was in the hospital at 3:30pm and had been in the room and that he called to notify the CSW that she was here but that the CSW did not follow back up with him. RN was present on the unit and husband had arrived at about 3:30pm and RN called CSW to notify her that he was here the daughter was not present at that time. Husband stated to the RN that he was not made aware of the plan to discharge the patient to a SNF. Husband stated to RN that his wife needed debridement and that we were not doing anything about it. RN reminded husband that the patient has been seen by three different surgeons and that they all recommended debridement of the wound but that he and his wife had refused. Husband became defensive and stated that that was not true and that they had refused because no one could explain the plan of care as far as the surgery to them.  RN notified Dr. Lonny Prude of husbands demands to take his "wife out of here tonight." MD stated that he could not discharge the patient as there was no safe discharge plan in place. This was explained to the husband and he was informed that he would have to sign the patient out AMA. The RN explained the [place] paperwork and risks of leaving AMA to the patient, husband, and  daughter. Husband refused to sign the AMA paperwork. Patient, husband, and daughter all in agreeance that the the patient was leaving this hospital tonight. IV removed. Husband and daughter dressed the patient and took her out of the hospital in a wheelchair and loaded her themselves into their vehicle.   Michial Disney, Fraser Din 02/26/2018 8:13 PM  After they left the hospital, housekeeping found a cellphone and charger in the room. As staff were attempting to take the phone to security, they met the daughter in the hallway on her way back to retrieve it. Cell phone and charger given to the daughter.

## 2018-02-27 DIAGNOSIS — E039 Hypothyroidism, unspecified: Secondary | ICD-10-CM | POA: Diagnosis present

## 2018-02-27 NOTE — Progress Notes (Signed)
Note after d/c in f/u to prior notes from yesterday. Noted patient/spouse left AMA-but refused to sign AMA papers.CM cons placed but since left AMA-unable to provide services.

## 2018-02-28 NOTE — Care Management (Signed)
Natalie Macdonald/ Natalie Macdonald states Patient lost appeal last covered 02/26/18 he will notify Patient and insurance company

## 2018-03-03 MED ORDER — NALOXONE HCL 0.4 MG/ML IJ SOLN
0.40 | INTRAMUSCULAR | Status: DC
Start: ? — End: 2018-03-03

## 2018-03-03 MED ORDER — BACLOFEN 10 MG PO TABS
10.00 | ORAL_TABLET | ORAL | Status: DC
Start: 2018-03-03 — End: 2018-03-03

## 2018-03-03 MED ORDER — ATORVASTATIN CALCIUM 10 MG PO TABS
10.00 | ORAL_TABLET | ORAL | Status: DC
Start: 2018-04-20 — End: 2018-03-03

## 2018-03-03 MED ORDER — OXYCODONE HCL 5 MG PO TABS
10.00 | ORAL_TABLET | ORAL | Status: DC
Start: ? — End: 2018-03-03

## 2018-03-03 MED ORDER — ASPIRIN EC 81 MG PO TBEC
81.00 | DELAYED_RELEASE_TABLET | ORAL | Status: DC
Start: 2018-03-03 — End: 2018-03-03

## 2018-03-03 MED ORDER — ACETAMINOPHEN 325 MG PO TABS
650.00 | ORAL_TABLET | ORAL | Status: DC
Start: ? — End: 2018-03-03

## 2018-03-03 MED ORDER — GENERIC EXTERNAL MEDICATION
1.00 g | Status: DC
Start: 2018-03-03 — End: 2018-03-03

## 2018-03-03 MED ORDER — SODIUM HYPOCHLORITE EX
CUTANEOUS | Status: DC
Start: 2018-04-20 — End: 2018-03-03

## 2018-03-03 MED ORDER — VITAMIN E 180 MG (400 UNIT) PO CAPS
400.00 | ORAL_CAPSULE | ORAL | Status: DC
Start: 2018-03-09 — End: 2018-03-03

## 2018-03-03 MED ORDER — GABAPENTIN 100 MG PO CAPS
200.00 | ORAL_CAPSULE | ORAL | Status: DC
Start: 2018-03-03 — End: 2018-03-03

## 2018-03-03 MED ORDER — METRONIDAZOLE IN NACL 5-0.79 MG/ML-% IV SOLN
500.00 | INTRAVENOUS | Status: DC
Start: 2018-03-03 — End: 2018-03-03

## 2018-03-03 MED ORDER — LEVOTHYROXINE SODIUM 175 MCG PO TABS
175.00 | ORAL_TABLET | ORAL | Status: DC
Start: 2018-04-20 — End: 2018-03-03

## 2018-03-03 MED ORDER — DULOXETINE HCL 60 MG PO CPEP
120.00 | ORAL_CAPSULE | ORAL | Status: DC
Start: 2018-03-04 — End: 2018-03-03

## 2018-03-03 MED ORDER — GENERIC EXTERNAL MEDICATION
1.00 | Status: DC
Start: 2018-03-11 — End: 2018-03-03

## 2018-03-03 MED ORDER — AMITRIPTYLINE HCL 50 MG PO TABS
100.00 | ORAL_TABLET | ORAL | Status: DC
Start: 2018-03-03 — End: 2018-03-03

## 2018-03-03 MED ORDER — GENERIC EXTERNAL MEDICATION
40.00 | Status: DC
Start: 2018-03-04 — End: 2018-03-03

## 2018-03-03 MED ORDER — HEPARIN SODIUM (PORCINE) 5000 UNIT/ML IJ SOLN
5000.00 | INTRAMUSCULAR | Status: DC
Start: 2018-03-19 — End: 2018-03-03

## 2018-03-03 MED ORDER — GENERIC EXTERNAL MEDICATION
100.00 | Status: DC
Start: 2018-04-20 — End: 2018-03-03

## 2018-03-03 MED ORDER — GENERIC EXTERNAL MEDICATION
Status: DC
Start: ? — End: 2018-03-03

## 2018-03-03 MED ORDER — MUPIROCIN 2 % EX OINT
TOPICAL_OINTMENT | CUTANEOUS | Status: DC
Start: 2018-03-03 — End: 2018-03-03

## 2018-03-06 MED ORDER — FOLIC ACID 1 MG PO TABS
1.00 | ORAL_TABLET | ORAL | Status: DC
Start: 2018-04-20 — End: 2018-03-06

## 2018-03-06 MED ORDER — THIAMINE HCL 100 MG PO TABS
100.00 | ORAL_TABLET | ORAL | Status: DC
Start: 2018-04-20 — End: 2018-03-06

## 2018-03-06 MED ORDER — RANITIDINE HCL 150 MG PO TABS
150.00 | ORAL_TABLET | ORAL | Status: DC
Start: 2018-03-19 — End: 2018-03-06

## 2018-03-06 MED ORDER — DEXTROSE 10 % IV SOLN
125.00 | INTRAVENOUS | Status: DC
Start: ? — End: 2018-03-06

## 2018-03-06 MED ORDER — ASPIRIN 81 MG PO CHEW
81.00 | CHEWABLE_TABLET | ORAL | Status: DC
Start: 2018-04-20 — End: 2018-03-06

## 2018-03-06 MED ORDER — GENERIC EXTERNAL MEDICATION
1.00 | Status: DC
Start: ? — End: 2018-03-06

## 2018-03-06 MED ORDER — INSULIN REGULAR HUMAN 100 UNIT/ML IJ SOLN
2.00 | INTRAMUSCULAR | Status: DC
Start: 2018-03-11 — End: 2018-03-06

## 2018-03-06 NOTE — Discharge Summary (Addendum)
Patient left against medical advice.   Briefly, patient presented and treated for sepsis/bacteremia, requiring about 8L of IV fluids to stabilize. She was treated empirically with Vancomycin/Zosyn and transitioned to Doxycycline and Flagyl. Sepsis secondary to large, unstageable sacral decubitus ulcer and possibly contributed to by sacral osteomyelitis. Plan was for debridement and continued wound care and physical therapy as an outpatient, however, patient/family declined this plan secondary to concerns of leaving a larger wound and post-op healing/wound care. Because of patient's decision to decline optimal management of wound, plan was to continue oral antibiotics at SNF.  Cordelia Poche, MD [facility] 03/07/2018, 12:01 AM

## 2018-03-07 MED ORDER — COLLAGENASE 250 UNIT/GM EX OINT
TOPICAL_OINTMENT | CUTANEOUS | Status: DC
Start: 2018-03-20 — End: 2018-03-07

## 2018-03-07 MED ORDER — SODIUM CHLORIDE 0.9 % IV SOLN
INTRAVENOUS | Status: DC
Start: ? — End: 2018-03-07

## 2018-03-08 ENCOUNTER — Encounter: Payer: Self-pay | Admitting: Neurology

## 2018-03-08 MED ORDER — SODIUM CHLORIDE 0.9 % IV SOLN
250.00 | INTRAVENOUS | Status: DC
Start: ? — End: 2018-03-08

## 2018-03-10 MED ORDER — OXYCODONE HCL 5 MG PO TABS
5.00 | ORAL_TABLET | ORAL | Status: DC
Start: ? — End: 2018-03-10

## 2018-03-10 MED ORDER — VITAMIN E 45 MG (100 UNIT) PO CAPS
400.00 | ORAL_CAPSULE | ORAL | Status: DC
Start: 2018-03-11 — End: 2018-03-10

## 2018-03-10 MED ORDER — CIPROFLOXACIN IN D5W 400 MG/200ML IV SOLN
400.00 | INTRAVENOUS | Status: DC
Start: 2018-03-11 — End: 2018-03-10

## 2018-03-10 MED ORDER — BACLOFEN 10 MG PO TABS
2.50 | ORAL_TABLET | ORAL | Status: DC
Start: ? — End: 2018-03-10

## 2018-03-18 ENCOUNTER — Encounter: Payer: Self-pay | Admitting: Neurology

## 2018-03-19 MED ORDER — INSULIN REGULAR HUMAN 100 UNIT/ML IJ SOLN
3.00 | INTRAMUSCULAR | Status: DC
Start: 2018-03-20 — End: 2018-03-19

## 2018-03-19 MED ORDER — CIPROFLOXACIN IN D5W 400 MG/200ML IV SOLN
400.00 | INTRAVENOUS | Status: DC
Start: 2018-03-19 — End: 2018-03-19

## 2018-03-19 MED ORDER — GENERIC EXTERNAL MEDICATION
Status: DC
Start: ? — End: 2018-03-19

## 2018-03-19 MED ORDER — MORPHINE SULFATE ER 15 MG PO TBCR
15.00 | EXTENDED_RELEASE_TABLET | ORAL | Status: DC
Start: 2018-03-20 — End: 2018-03-19

## 2018-03-19 MED ORDER — GENERIC EXTERNAL MEDICATION
40.00 | Status: DC
Start: 2018-03-20 — End: 2018-03-19

## 2018-03-19 MED ORDER — GENERIC EXTERNAL MEDICATION
Status: DC
Start: 2018-03-20 — End: 2018-03-19

## 2018-03-19 MED ORDER — GENERIC EXTERNAL MEDICATION
1.00 | Status: DC
Start: 2018-03-20 — End: 2018-03-19

## 2018-04-05 ENCOUNTER — Ambulatory Visit: Payer: Medicare Other | Admitting: Neurology

## 2018-04-05 ENCOUNTER — Encounter

## 2018-04-12 ENCOUNTER — Ambulatory Visit: Payer: Medicare Other | Admitting: Neurology

## 2018-04-12 NOTE — Progress Notes (Deleted)
[facility] Clinic Note - Initial Visit   Date: 04/12/18  Natalie Macdonald MRN: [id number] DOB: 07/01/1956   Dear Dr Clifton Custard, MD:  Thank you for your kind referral of Natalie Macdonald for consultation of ***. Although *** history is well known to you, please allow [place] to reiterate it for the purpose of our medical record. The patient was accompanied to the clinic by *** who also provides collateral information.     History of Present Illness: Natalie Macdonald is a 62 y.o. ***-handed Caucasian/*** ***female with *** presenting for evaluation of ***.    Out-side paper records, electronic medical record, and images have been reviewed where available and summarized as: *** NCS/EMG of the right arm 12/13/2017: 1. Severe and chronic bilateral ulnar neuropathy, which is demyelinating and axon loss in type, localized to the elbow on the left (i.e. left cubital tunnel syndrome) and non-localizable on the right, but at least proximal to the takeoff to the flexor carpi ulnaris muscle.   2. 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   Past Medical History:  Diagnosis Date  . Allergic rhinitis   . Arthritis    knees, shoulder  . Bronchitis    history  . Cellulitis of left foot   . Cervical disc disorder   . 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 Chronic osteomyelitis ([place])    left foot  . Chronic respiratory infection   . Diabetes mellitus without complication ([place])   . Diabetes mellitus, type II ([place])   . Diabetic foot ulcer ([place])    left foot  . Fibromyalgia   . GERD (gastroesophageal reflux disease)   . Headache    sinus  . History of blood transfusion   . Hypertension   . Hyponatremia   . hypothyroid   . Hypothyroidism   . Joint pain    bilateral legs  . Left hemiplegia ([place])   . Sarcoidosis   . Sepsis ([place])    HX    . Sepsis ([place])   . Uterine fibroid    hx  . Uterine fibroid     Past Surgical History:  Procedure Laterality Date  . ANTERIOR CERVICAL DECOMP/DISCECTOMY FUSION N/A 10/17/2016   Procedure: ANTERIOR CERVICAL DECOMPRESSION/DISCECTOMY FUSION CERVICAL FIVE- CERVICAL SIX, CERVICAL SIX- CERVICAL SEVEN, POSSIBLE CERVICAL SIX CORPECTOMY;  Surgeon: Consuella Lose, MD;  Location: [facility];  Service: Neurosurgery;  Laterality: N/A;  . ANTERIOR CERVICAL DECOMP/DISCECTOMY FUSION    . APPLICATION OF WOUND VAC Left    foot  . CHOLECYSTECTOMY    . FOOT SURGERY Bilateral    implants  . IRRIGATION AND DEBRIDEMENT FOOT Left    X2  . KNEE ARTHROPLASTY    . THYROIDECTOMY    . UTERINE FIBROID SURGERY       Medications:  Outpatient Encounter Medications as of 04/12/2018  Medication Sig Note  . amitriptyline (ELAVIL) 100 MG tablet Take 100 mg by mouth at bedtime.    Marland Kitchen amLODipine-benazepril (LOTREL) 5-40 MG capsule Take 1 capsule by mouth daily.   . APIDRA 100 UNIT/ML injection Inject 18-25 Units into the skin 3 (three) times daily with meals. Sliding Scale 02/10/2018: Pt had 18 units at 11pm  . baclofen (LIORESAL) 10 MG tablet Take 10 mg by mouth 3 (three) times daily as needed for muscle spasms.    . Calcium Carbonate (CALCIUM 600 PO) Take 600 mg by mouth daily.   Marland Kitchen  carvedilol (COREG) 25 MG tablet Take 25 mg by mouth 2 (two) times daily with a meal.   . cetirizine (ZYRTEC) 10 MG tablet Take 10 mg by mouth daily as needed for allergies.    . chlorthalidone (HYGROTON) 25 MG tablet Take 25 mg by mouth daily.   Marland Kitchen CRANBERRY PO Take 2 capsules by mouth daily.   . diclofenac (VOLTAREN) 75 MG EC tablet Take 75 mg by mouth 2 (two) times daily. 01/04/2018: LF 12-05-17 30DS  . doxycycline (DORYX) 100 MG EC tablet Take 100 mg by mouth 2 (two) times daily.   . DULoxetine (CYMBALTA) 30 MG capsule Take 60 mg by mouth daily. 01/04/2018: LF 11-25-17 30DS  . fluticasone (FLONASE) 50 MCG/ACT nasal spray Place 2 sprays into  both nostrils daily as needed for allergies.    Marland Kitchen gabapentin (NEURONTIN) 300 MG capsule Take 300 mg by mouth 3 (three) times daily as needed (for pain).    Marland Kitchen levothyroxine (SYNTHROID, LEVOTHROID) 150 MCG tablet Take 150 mcg by mouth daily before breakfast.   . metFORMIN (GLUCOPHAGE) 500 MG tablet Take 500 mg by mouth 2 (two) times daily with a meal.   . morphine (MS CONTIN) 30 MG 12 hr tablet Take 30 mg by mouth 2 (two) times daily. 01/04/2018: LF 10-20-17 90DS  . Multiple Vitamin (MULTIVITAMIN) tablet Take 1 tablet by mouth daily.   Marland Kitchen oxybutynin (DITROPAN XL) 15 MG 24 hr tablet Take 15 mg by mouth daily.  01/04/2018: LF 12-26-17 90DS  . Oxycodone HCl 10 MG TABS Take 10 mg by mouth every 8 (eight) hours as needed (for pain).    . pantoprazole (PROTONIX) 40 MG tablet Take 40 mg by mouth daily.   . pravastatin (PRAVACHOL) 20 MG tablet Take 20 mg by mouth daily.   . Probiotic Product (PROBIOTIC PO) Take 2 capsules by mouth daily.   . Pyridoxine HCl (VITAMIN B-6) 250 MG tablet Take 250 mg by mouth daily.   . ranitidine (ZANTAC) 75 MG tablet Take 150 mg by mouth daily as needed for heartburn.   . silver sulfADIAZINE (SILVADENE) 1 % cream Apply 1 application topically 2 (two) times daily.    Marland Kitchen tiZANidine (ZANAFLEX) 4 MG tablet Take 4 mg by mouth every 8 (eight) hours as needed for muscle spasms.  01/04/2018: LF 12-05-17 30DS  . TOUJEO SOLOSTAR 300 UNIT/ML SOPN Inject 40-50 Units into the skin at bedtime. Sliding Scale   . vitamin B-12 (CYANOCOBALAMIN) 1000 MCG tablet Take 1,000 mcg by mouth daily.   . vitamin E 400 UNIT capsule Take 400 Units by mouth daily.    No facility-administered encounter medications on file as of 04/12/2018.      Allergies:  Allergies  Allergen Reactions  . Grapefruit Bioflavonoid Complex Hives, Swelling and Other (See Comments)    Caused her to get hives, swelling of the eye, throat   . Latex Hives  . Procaine Anaphylaxis  . Procaine Anaphylaxis  . Grapefruit Flavor  [Flavoring Agent]   . Novolog [Insulin Aspart]     Okay to use Regular insulin (patient doesn't tolerate Novolog)      Family History: Family History  Problem Relation Age of Onset  . Throat cancer Mother     Social History: Social History   Tobacco Use  . Smoking status: Never Smoker  . Smokeless tobacco: Never Used  Substance Use Topics  . Alcohol use: No  . Drug use: No   Social History   Social History Narrative   ** Merged  History Encounter **        Review of Systems:  CONSTITUTIONAL: No fevers, chills, night sweats, or weight loss.  *** EYES: No visual changes or eye pain ENT: No hearing changes.  No history of nose bleeds.   RESPIRATORY: No cough, wheezing and shortness of breath.   CARDIOVASCULAR: Negative for chest pain, and palpitations.   GI: Negative for abdominal discomfort, blood in stools or black stools.  No recent change in bowel habits.   GU:  No history of incontinence.   MUSCLOSKELETAL: No history of joint pain or swelling.  No myalgias.   SKIN: Negative for lesions, rash, and itching.   HEMATOLOGY/ONCOLOGY: Negative for prolonged bleeding, bruising easily, and swollen nodes.  No history of cancer.   ENDOCRINE: Negative for cold or heat intolerance, polydipsia or goiter.   PSYCH:  ***depression or anxiety symptoms.   NEURO: As Above.   Vital Signs:  LMP  (LMP Unknown)  Pain Scale: *** on a scale of 0-10   General Medical Exam:  *** General:  Well appearing, comfortable.   Eyes/ENT: see cranial nerve examination.   Neck: No masses appreciated.  Full range of motion without tenderness.  No carotid bruits. Respiratory:  Clear to auscultation, good air entry bilaterally.   Cardiac:  Regular rate and rhythm, no murmur.   Extremities:  No deformities, edema, or skin discoloration.  Skin:  No rashes or lesions.  Neurological Exam: MENTAL STATUS including orientation to time, place, person, recent and remote memory, attention span and  concentration, language, and fund of knowledge is ***normal.  Speech is not dysarthric.  CRANIAL NERVES: II:  No visual field defects.  Unremarkable fundi.   III-IV-VI: Pupils equal round and reactive to light.  Normal conjugate, extra-ocular eye movements in all directions of gaze.  No nystagmus.  No ptosis***.   V:  Normal facial sensation.  Jaw jerk is ***.   VII:  Normal facial symmetry and movements.  No pathologic facial reflexes.  VIII:  Normal hearing and vestibular function.   IX-X:  Normal palatal movement.   XI:  Normal shoulder shrug and head rotation.   XII:  Normal tongue strength and range of motion, no deviation or fasciculation.  MOTOR:  No atrophy, fasciculations or abnormal movements.  No pronator drift.  Tone is normal.    Right Upper Extremity:    Left Upper Extremity:    Deltoid  5/5   Deltoid  5/5   Biceps  5/5   Biceps  5/5   Triceps  5/5   Triceps  5/5   Wrist extensors  5/5   Wrist extensors  5/5   Wrist flexors  5/5   Wrist flexors  5/5   Finger extensors  5/5   Finger extensors  5/5   Finger flexors  5/5   Finger flexors  5/5   Dorsal interossei  5/5   Dorsal interossei  5/5   Abductor pollicis  5/5   Abductor pollicis  5/5   Tone (Ashworth scale)  0  Tone (Ashworth scale)  0   Right Lower Extremity:    Left Lower Extremity:    Hip flexors  5/5   Hip flexors  5/5   Hip extensors  5/5   Hip extensors  5/5   Knee flexors  5/5   Knee flexors  5/5   Knee extensors  5/5   Knee extensors  5/5   Dorsiflexors  5/5   Dorsiflexors  5/5   Plantarflexors  5/5  Plantarflexors  5/5   Toe extensors  5/5   Toe extensors  5/5   Toe flexors  5/5   Toe flexors  5/5   Tone (Ashworth scale)  0  Tone (Ashworth scale)  0   MSRs:  Right                                                                 Left brachioradialis 2+  brachioradialis 2+  biceps 2+  biceps 2+  triceps 2+  triceps 2+  patellar 2+  patellar 2+  ankle jerk 2+  ankle jerk 2+  Hoffman no  Hoffman no   plantar response down  plantar response down   SENSORY:  Normal and symmetric perception of light touch, pinprick, vibration, and proprioception.  Romberg's sign absent.   COORDINATION/GAIT: Normal finger-to- nose-finger and heel-to-shin.  Intact rapid alternating movements bilaterally.  Able to rise from a chair without using arms.  Gait narrow based and stable. Tandem and stressed gait intact.    IMPRESSION: ***  PLAN/RECOMMENDATIONS:  *** Return to clinic in *** months.   The duration of this appointment visit was *** minutes of face-to-face time with the patient.  Greater than 50% of this time was spent in counseling, explanation of diagnosis, planning of further management, and coordination of care.   Thank you for allowing me to participate in patient's care.  If I can answer any additional questions, I would be pleased to do so.    Sincerely,    Musa Rewerts K. Posey Pronto, DO

## 2018-04-21 MED ORDER — FLUCONAZOLE 200 MG PO TABS
400.00 | ORAL_TABLET | ORAL | Status: DC
Start: 2018-04-20 — End: 2018-04-21

## 2018-04-21 MED ORDER — OXYCODONE-ACETAMINOPHEN 5-325 MG PO TABS
1.00 | ORAL_TABLET | ORAL | Status: DC
Start: ? — End: 2018-04-21

## 2018-04-21 MED ORDER — CIPROFLOXACIN HCL 750 MG PO TABS
750.00 | ORAL_TABLET | ORAL | Status: DC
Start: 2018-04-19 — End: 2018-04-21

## 2018-04-21 MED ORDER — MAGNESIUM OXIDE 400 MG PO TABS
400.00 | ORAL_TABLET | ORAL | Status: DC
Start: 2018-04-20 — End: 2018-04-21

## 2018-04-21 MED ORDER — ENOXAPARIN SODIUM 40 MG/0.4ML ~~LOC~~ SOLN
40.00 | SUBCUTANEOUS | Status: DC
Start: 2018-04-20 — End: 2018-04-21

## 2018-04-21 MED ORDER — DEXTROSE 10 % IV SOLN
125.00 | INTRAVENOUS | Status: DC
Start: ? — End: 2018-04-21

## 2018-04-21 MED ORDER — BACLOFEN 10 MG PO TABS
5.00 | ORAL_TABLET | ORAL | Status: DC
Start: ? — End: 2018-04-21

## 2018-04-21 MED ORDER — ACETAMINOPHEN 325 MG PO TABS
325.00 | ORAL_TABLET | ORAL | Status: DC
Start: ? — End: 2018-04-21

## 2018-04-21 MED ORDER — DIPHENHYDRAMINE HCL 25 MG PO CAPS
25.00 | ORAL_CAPSULE | ORAL | Status: DC
Start: ? — End: 2018-04-21

## 2018-04-21 MED ORDER — GENERIC EXTERNAL MEDICATION
1.00 | Status: DC
Start: ? — End: 2018-04-21

## 2018-04-21 MED ORDER — GENERIC EXTERNAL MEDICATION
500.00 | Status: DC
Start: 2018-04-20 — End: 2018-04-21

## 2018-04-21 MED ORDER — GABAPENTIN 300 MG PO CAPS
300.00 | ORAL_CAPSULE | ORAL | Status: DC
Start: 2018-04-19 — End: 2018-04-21

## 2018-04-21 MED ORDER — AMITRIPTYLINE HCL 25 MG PO TABS
50.00 | ORAL_TABLET | ORAL | Status: DC
Start: 2018-04-19 — End: 2018-04-21

## 2018-04-21 MED ORDER — INSULIN REGULAR HUMAN 100 UNIT/ML IJ SOLN
2.00 | INTRAMUSCULAR | Status: DC
Start: 2018-04-20 — End: 2018-04-21

## 2018-04-21 MED ORDER — MORPHINE SULFATE ER 15 MG PO TBCR
30.00 | EXTENDED_RELEASE_TABLET | ORAL | Status: DC
Start: 2018-04-19 — End: 2018-04-21

## 2018-04-21 MED ORDER — ONDANSETRON HCL 4 MG PO TABS
4.00 | ORAL_TABLET | ORAL | Status: DC
Start: ? — End: 2018-04-21

## 2018-04-21 MED ORDER — PANTOPRAZOLE SODIUM 40 MG PO TBEC
40.00 | DELAYED_RELEASE_TABLET | ORAL | Status: DC
Start: 2018-04-19 — End: 2018-04-21

## 2018-04-21 MED ORDER — DICLOFENAC SODIUM 1 % TD GEL
2.00 | TRANSDERMAL | Status: DC
Start: 2018-04-19 — End: 2018-04-21

## 2018-04-21 MED ORDER — FERROUS GLUCONATE 324 (38 FE) MG PO TABS
324.00 | ORAL_TABLET | ORAL | Status: DC
Start: 2018-04-20 — End: 2018-04-21

## 2018-04-21 MED ORDER — PROCHLORPERAZINE MALEATE 5 MG PO TABS
5.00 | ORAL_TABLET | ORAL | Status: DC
Start: ? — End: 2018-04-21

## 2018-04-21 MED ORDER — INSULIN GLARGINE 100 UNIT/ML SOLOSTAR PEN
10.00 | PEN_INJECTOR | SUBCUTANEOUS | Status: DC
Start: 2018-04-19 — End: 2018-04-21

## 2018-04-21 MED ORDER — THERA PO TABS
1.00 | ORAL_TABLET | ORAL | Status: DC
Start: 2018-04-20 — End: 2018-04-21

## 2019-04-24 ENCOUNTER — Encounter (HOSPITAL_COMMUNITY): Payer: Self-pay | Admitting: Emergency Medicine

## 2019-04-24 ENCOUNTER — Emergency Department (HOSPITAL_COMMUNITY)
Admission: EM | Admit: 2019-04-24 | Discharge: 2019-04-24 | Disposition: A | Discharge status: Home / Self Care | Payer: Medicare HMO | Attending: Emergency Medicine | Admitting: Emergency Medicine

## 2019-04-24 ENCOUNTER — Emergency Department (HOSPITAL_COMMUNITY): Payer: Medicare HMO

## 2019-04-24 ENCOUNTER — Other Ambulatory Visit: Payer: Self-pay

## 2019-04-24 DIAGNOSIS — Z79899 Other long term (current) drug therapy: Secondary | ICD-10-CM | POA: Diagnosis not present

## 2019-04-24 DIAGNOSIS — Z7984 Long term (current) use of oral hypoglycemic drugs: Secondary | ICD-10-CM | POA: Insufficient documentation

## 2019-04-24 DIAGNOSIS — I10 Essential (primary) hypertension: Secondary | ICD-10-CM | POA: Diagnosis not present

## 2019-04-24 DIAGNOSIS — E039 Hypothyroidism, unspecified: Secondary | ICD-10-CM | POA: Insufficient documentation

## 2019-04-24 DIAGNOSIS — N3000 Acute cystitis without hematuria: Secondary | ICD-10-CM | POA: Insufficient documentation

## 2019-04-24 DIAGNOSIS — E119 Type 2 diabetes mellitus without complications: Secondary | ICD-10-CM | POA: Diagnosis not present

## 2019-04-24 DIAGNOSIS — Z91040 Latex allergy status: Secondary | ICD-10-CM | POA: Diagnosis not present

## 2019-04-24 DIAGNOSIS — R4182 Altered mental status, unspecified: Secondary | ICD-10-CM | POA: Diagnosis present

## 2019-04-24 LAB — CBC WITH DIFFERENTIAL/PLATELET
Abs Immature Granulocytes: 0.08 10*3/uL — ABNORMAL HIGH (ref 0.00–0.07)
Basophils Absolute: 0.1 10*3/uL (ref 0.0–0.1)
Basophils Relative: 0 %
Eosinophils Absolute: 0.1 10*3/uL (ref 0.0–0.5)
Eosinophils Relative: 1 %
HCT: 37.7 % (ref 36.0–46.0)
Hemoglobin: 11.5 g/dL — ABNORMAL LOW (ref 12.0–15.0)
Immature Granulocytes: 1 %
Lymphocytes Relative: 20 %
Lymphs Abs: 3.2 10*3/uL (ref 0.7–4.0)
MCH: 28.5 pg (ref 26.0–34.0)
MCHC: 30.5 g/dL (ref 30.0–36.0)
MCV: 93.3 fL (ref 80.0–100.0)
Monocytes Absolute: 0.9 10*3/uL (ref 0.1–1.0)
Monocytes Relative: 6 %
Neutro Abs: 11.8 10*3/uL — ABNORMAL HIGH (ref 1.7–7.7)
Neutrophils Relative %: 72 %
Platelets: 343 10*3/uL (ref 150–400)
RBC: 4.04 MIL/uL (ref 3.87–5.11)
RDW: 16.5 % — ABNORMAL HIGH (ref 11.5–15.5)
WBC: 16.1 10*3/uL — ABNORMAL HIGH (ref 4.0–10.5)
nRBC: 0 % (ref 0.0–0.2)

## 2019-04-24 LAB — COMPREHENSIVE METABOLIC PANEL
ALT: 22 U/L (ref 0–44)
AST: 25 U/L (ref 15–41)
Albumin: 3.5 g/dL (ref 3.5–5.0)
Alkaline Phosphatase: 77 U/L (ref 38–126)
Anion gap: 11 (ref 5–15)
BUN: 29 mg/dL — ABNORMAL HIGH (ref 8–23)
CO2: 26 mmol/L (ref 22–32)
Calcium: 9.5 mg/dL (ref 8.9–10.3)
Chloride: 99 mmol/L (ref 98–111)
Creatinine, Ser: 1.27 mg/dL — ABNORMAL HIGH (ref 0.44–1.00)
GFR calc Af Amer: 52 mL/min — ABNORMAL LOW (ref >60)
GFR calc non Af Amer: 45 mL/min — ABNORMAL LOW (ref >60)
Glucose, Bld: 243 mg/dL — ABNORMAL HIGH (ref 70–99)
Potassium: 4.2 mmol/L (ref 3.5–5.1)
Sodium: 136 mmol/L (ref 135–145)
Total Bilirubin: 0.7 mg/dL (ref 0.3–1.2)
Total Protein: 8.5 g/dL — ABNORMAL HIGH (ref 6.5–8.1)

## 2019-04-24 LAB — URINALYSIS, ROUTINE W REFLEX MICROSCOPIC
Bilirubin Urine: NEGATIVE
Glucose, UA: NEGATIVE mg/dL
Hgb urine dipstick: NEGATIVE
Ketones, ur: 5 mg/dL — AB
Nitrite: NEGATIVE
Protein, ur: 100 mg/dL — AB
Specific Gravity, Urine: 1.026 (ref 1.005–1.030)
WBC, UA: >50 WBC/hpf — ABNORMAL HIGH (ref 0–5)
pH: 7 (ref 5.0–8.0)

## 2019-04-24 LAB — CBG MONITORING, ED: Glucose-Capillary: 257 mg/dL — ABNORMAL HIGH (ref 70–99)

## 2019-04-24 LAB — LACTIC ACID, PLASMA: Lactic Acid, Venous: 1.3 mmol/L (ref 0.5–1.9)

## 2019-04-24 MED ORDER — CEPHALEXIN 500 MG PO CAPS: 500.0000 mg | ORAL_CAPSULE | Freq: Four times a day (QID) | ORAL | 0 refills | Status: DC

## 2019-04-24 MED ORDER — SODIUM CHLORIDE 0.9 % IV SOLN
1.0000 g | Freq: Once | INTRAVENOUS | Status: AC
  Administered 2019-04-24: 16:00:00 1 g via INTRAVENOUS
  Filled 2019-04-24: qty 10

## 2019-04-24 NOTE — ED Notes (Signed)
[facility] called for transport.  

## 2019-04-24 NOTE — ED Provider Notes (Signed)
[facility] DEPT Provider Note   CSN: [id number] Arrival date & time: 04/24/19  1103     History   Chief Complaint Chief Complaint  Patient presents with  . Altered Mental Status    HPI Natalie Macdonald is a 63 y.o. female.     Pt presents to the ED today with AMS.  The pt is bed bound due to a prior CVA with left sided hemiplegia.  She has an indwelling foley and family thinks she has another UTI.  Pt denies f/c.        Past Medical History:  Diagnosis Date  . Allergic rhinitis   . Arthritis    knees, shoulder  . Bronchitis    history  . Cellulitis of left foot   . Cervical disc disorder   . 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 Chronic osteomyelitis ([place])    left foot  . Chronic respiratory infection   . Diabetes mellitus without complication ([place])   . Diabetes mellitus, type II ([place])   . Diabetic foot ulcer ([place])    left foot  . Fibromyalgia   . GERD (gastroesophageal reflux disease)   . Headache    sinus  . History of blood transfusion   . Hypertension   . Hyponatremia   . hypothyroid   . Hypothyroidism   . Joint pain    bilateral legs  . Left hemiplegia ([place])   . Sarcoidosis   . Sepsis ([place])    HX  . Sepsis ([place])   . Uterine fibroid    hx  . Uterine fibroid     Patient Active Problem List   Diagnosis Date Noted  . Diarrhea 01/06/2018  . Sepsis ([place]) 01/03/2018  . Cellulitis 01/03/2018  . Sacral decubitus ulcer, stage III ([place]) 01/03/2018  . AKI (acute kidney injury) ([place]) 01/03/2018  . Dehydration 01/03/2018  . Toxic encephalopathy 01/03/2018  . Adverse drug effect 01/03/2018  . Acute encephalopathy   . Hypoxemia   . Palliative care encounter   . Septic shock ([place])   . Acute respiratory failure with hypoxemia ([place])   . Pressure injury of skin 10/18/2016  . Cervical spondylosis with myelopathy 10/17/2016  . Chronic left-sided low back pain with left-sided sciatica 09/05/2016   . Cervicalgia 09/05/2016  . Diabetes mellitus type 2 with complications ([place]) [id number]  . Hyperlipidemia, unspecified 09/05/2016  . Hypertension 09/05/2016  . Sarcoidosis 09/05/2016  . Cervical disc disorder with myelopathy of mid-cervical region 03/13/2016  . Left hemiplegia ([place]) 02/07/2016  . Chronic osteomyelitis of ankle and foot, left ([place]) 09/25/2014  . Hyponatremia 09/25/2014  . Diabetic ulcer of left foot associated with type 2 diabetes mellitus ([place]) 08/30/2014  . Charcot's joint of foot due to diabetes ([place]) 10/15/2013  . Pain in joint involving ankle and foot 10/15/2013    Past Surgical History:  Procedure Laterality Date  . ANTERIOR CERVICAL DECOMP/DISCECTOMY FUSION N/A 10/17/2016   Procedure: ANTERIOR CERVICAL DECOMPRESSION/DISCECTOMY FUSION CERVICAL FIVE- CERVICAL SIX, CERVICAL SIX- CERVICAL SEVEN, POSSIBLE CERVICAL SIX CORPECTOMY;  Surgeon: Consuella Lose, MD;  Location: [facility];  Service: Neurosurgery;  Laterality: N/A;  . ANTERIOR CERVICAL DECOMP/DISCECTOMY FUSION    . APPLICATION OF WOUND VAC Left    foot  . CHOLECYSTECTOMY    . FOOT SURGERY Bilateral    implants  . IRRIGATION AND DEBRIDEMENT FOOT Left    X2  . KNEE ARTHROPLASTY    . THYROIDECTOMY    . UTERINE FIBROID SURGERY  OB History   No obstetric history on file.      Home Medications    Prior to Admission medications   Medication Sig Start Date End Date Taking? Authorizing Provider  amitriptyline (ELAVIL) 100 MG tablet Take 100 mg by mouth at bedtime.     [provider]  amLODipine-benazepril (LOTREL) 5-40 MG capsule Take 1 capsule by mouth daily.    [provider]  APIDRA 100 UNIT/ML injection Inject 18-25 Units into the skin 3 (three) times daily with meals. Sliding Scale 12/13/17   [provider]  baclofen (LIORESAL) 10 MG tablet Take 10 mg by mouth 3 (three) times daily as needed for muscle spasms.  10/03/16   [provider]  Calcium Carbonate  (CALCIUM 600 PO) Take 600 mg by mouth daily.    [provider]  carvedilol (COREG) 25 MG tablet Take 25 mg by mouth 2 (two) times daily with a meal.    [provider]  cephALEXin (KEFLEX) 500 MG capsule Take 1 capsule (500 mg total) by mouth 4 (four) times daily. 04/24/19   Isla Pence, MD  cetirizine (ZYRTEC) 10 MG tablet Take 10 mg by mouth daily as needed for allergies.     [provider]  chlorthalidone (HYGROTON) 25 MG tablet Take 25 mg by mouth daily.    [provider]  CRANBERRY PO Take 2 capsules by mouth daily.    [provider]  diclofenac (VOLTAREN) 75 MG EC tablet Take 75 mg by mouth 2 (two) times daily. 12/05/17   [provider]  doxycycline (DORYX) 100 MG EC tablet Take 100 mg by mouth 2 (two) times daily. 01/24/18   [provider]  DULoxetine (CYMBALTA) 30 MG capsule Take 60 mg by mouth daily. 11/25/17   [provider]  fluticasone (FLONASE) 50 MCG/ACT nasal spray Place 2 sprays into both nostrils daily as needed for allergies.     [provider]  gabapentin (NEURONTIN) 300 MG capsule Take 300 mg by mouth 3 (three) times daily as needed (for pain).     [provider]  levothyroxine (SYNTHROID, LEVOTHROID) 150 MCG tablet Take 150 mcg by mouth daily before breakfast.    [provider]  metFORMIN (GLUCOPHAGE) 500 MG tablet Take 500 mg by mouth 2 (two) times daily with a meal.    [provider]  morphine (MS CONTIN) 30 MG 12 hr tablet Take 30 mg by mouth 2 (two) times daily. 10/30/17   [provider]  Multiple Vitamin (MULTIVITAMIN) tablet Take 1 tablet by mouth daily.    [provider]  oxybutynin (DITROPAN XL) 15 MG 24 hr tablet Take 15 mg by mouth daily.  11/06/17   [provider]  Oxycodone HCl 10 MG TABS Take 10 mg by mouth every 8 (eight) hours as needed (for pain).  10/04/16   [provider]  pantoprazole (PROTONIX) 40 MG tablet  Take 40 mg by mouth daily.    [provider]  pravastatin (PRAVACHOL) 20 MG tablet Take 20 mg by mouth daily.    [provider]  Probiotic Product (PROBIOTIC PO) Take 2 capsules by mouth daily.    [provider]  Pyridoxine HCl (VITAMIN B-6) 250 MG tablet Take 250 mg by mouth daily.    [provider]  ranitidine (ZANTAC) 75 MG tablet Take 150 mg by mouth daily as needed for heartburn.    [provider]  silver sulfADIAZINE (SILVADENE) 1 % cream Apply 1 application  topically 2 (two) times daily.  01/15/18   [provider]  tiZANidine (ZANAFLEX) 4 MG tablet Take 4 mg by mouth every 8 (eight) hours as needed for muscle spasms.  12/05/17   [provider]  TOUJEO SOLOSTAR 300 UNIT/ML SOPN Inject 40-50 Units into the skin at bedtime. Sliding Scale 10/30/17   [provider]  vitamin B-12 (CYANOCOBALAMIN) 1000 MCG tablet Take 1,000 mcg by mouth daily.    [provider]  vitamin E 400 UNIT capsule Take 400 Units by mouth daily.    [provider]    Family History Family History  Problem Relation Age of Onset  . Throat cancer Mother     Social History Social History   Tobacco Use  . Smoking status: Never Smoker  . Smokeless tobacco: Never Used  Substance Use Topics  . Alcohol use: No  . Drug use: No     Allergies   Grapefruit bioflavonoid complex, Latex, Procaine, Procaine, Grapefruit flavor [flavoring agent], and Novolog [insulin aspart]   Review of Systems Review of Systems  All other systems reviewed and are negative.    Physical Exam Updated Vital Signs BP (!) 96/58   Pulse 85   Temp 97.6 F (36.4 C) (Oral)   Resp 14   LMP  (LMP Unknown)   SpO2 100%   Physical Exam Vitals signs and nursing note reviewed.  HENT:     Head: Normocephalic and atraumatic.     Right Ear: External ear normal.     Left Ear: External ear normal.     Mouth/Throat:     Mouth: Mucous membranes are  dry.  Eyes:     Extraocular Movements: Extraocular movements intact.     Conjunctiva/sclera: Conjunctivae normal.     Pupils: Pupils are equal, round, and reactive to light.  Neck:     Musculoskeletal: Normal range of motion and neck supple.  Cardiovascular:     Rate and Rhythm: Normal rate and regular rhythm.     Pulses: Normal pulses.     Heart sounds: Normal heart sounds.  Pulmonary:     Effort: Pulmonary effort is normal.     Breath sounds: Normal breath sounds.  Abdominal:     General: Abdomen is flat. Bowel sounds are normal.     Palpations: Abdomen is soft.  Musculoskeletal:     Comments: Left sided contractures. Sacral decub with good granulation tissue from debridement last year.  Skin:    General: Skin is warm.     Capillary Refill: Capillary refill takes less than 2 seconds.  Neurological:     Mental Status: She is alert. Mental status is at baseline.  Psychiatric:        Mood and Affect: Mood normal.        Behavior: Behavior normal.        Thought Content: Thought content normal.        Judgment: Judgment normal.      ED Treatments / Results  Labs (all labs ordered are listed, but only abnormal results are displayed) Labs Reviewed  CBC WITH DIFFERENTIAL/PLATELET - Abnormal; Notable for the following components:      Result Value   WBC 16.1 (*)    Hemoglobin 11.5 (*)    RDW 16.5 (*)    Neutro Abs 11.8 (*)    Abs Immature Granulocytes 0.08 (*)    All other components within normal limits  COMPREHENSIVE METABOLIC PANEL - Abnormal; Notable for the following components:   Glucose, Bld  243 (*)    BUN 29 (*)    Creatinine, Ser 1.27 (*)    Total Protein 8.5 (*)    GFR calc non Af Amer 45 (*)    GFR calc Af Amer 52 (*)    All other components within normal limits  URINALYSIS, ROUTINE W REFLEX MICROSCOPIC - Abnormal; Notable for the following components:   Color, Urine AMBER (*)    APPearance CLOUDY (*)    Ketones, ur 5 (*)    Protein, ur 100 (*)     Leukocytes,Ua LARGE (*)    WBC, UA >50 (*)    Bacteria, UA MANY (*)    All other components within normal limits  CBG MONITORING, ED - Abnormal; Notable for the following components:   Glucose-Capillary 257 (*)    All other components within normal limits  URINE CULTURE  LACTIC ACID, PLASMA    EKG EKG Interpretation  Date/Time:  Thursday April 24 2019 11:47:10 EDT Ventricular Rate:  85 PR Interval:    QRS Duration: 132 QT Interval:  397 QTC Calculation: 473 R Axis:   44 Text Interpretation:  Sinus rhythm IVCD, consider atypical RBBB Since last tracing rate slower Confirmed by Isla Pence [phone]) on 04/24/2019 12:17:10 PM   Radiology Dg Chest Portable 1 View  Result Date: 04/24/2019 CLINICAL DATA:  Altered mental status EXAM: PORTABLE CHEST 1 VIEW COMPARISON:  June 28, 2018 FINDINGS: Lungs are clear. Heart size and pulmonary vascularity are normal. No adenopathy. No pneumothorax. There is postoperative change in the lower cervical region. IMPRESSION: No edema or consolidation. Electronically Signed   By: Lowella Grip III M.D.   On: 04/24/2019 12:41    Procedures Procedures (including critical care time)  Medications Ordered in ED Medications  cefTRIAXone (ROCEPHIN) 1 g in sodium chloride 0.9 % 100 mL IVPB (has no administration in time range)     Initial Impression / Assessment and Plan / ED Course  I have reviewed the triage vital signs and the nursing notes.  Pertinent labs & imaging results that were available during my care of the patient were reviewed by me and considered in my medical decision making (see chart for details).     Pt feels much better.  She does have a UTI.  She will be given a dose of rocephin here and d/c home with keflex.  Urine sent for cx.  Pt is stable for d/c.  Return if worse.  Final Clinical Impressions(s) / ED Diagnoses   Final diagnoses:  Acute cystitis without hematuria    ED Discharge Orders         Ordered     cephALEXin (KEFLEX) 500 MG capsule  4 times daily     04/24/19 1552           Isla Pence, MD 04/24/19 1553

## 2019-04-24 NOTE — ED Triage Notes (Signed)
The patient presents with altered mental status which began 4 hours ago. She has a recent history of a UTI and family believes she may have one now. She is bed bound and a chronic cathter user. EMS found her to be A&O x3.      EMS vitals:  136/74 BP 78 HR 16 Resp Rate 96% O2 sat on room air 211 CBG 97.8 Temp

## 2019-04-27 LAB — URINE CULTURE: Culture: >=100000 — AB

## 2019-04-28 ENCOUNTER — Telehealth: Payer: Self-pay | Admitting: *Deleted

## 2019-04-28 NOTE — Telephone Encounter (Signed)
Post ED Visit - Positive Culture Follow-up  Culture report reviewed by antimicrobial stewardship pharmacist: [facility] []  Elenor Quinones, Pharm.D. []  Heide Guile, Pharm.D., BCPS AQ-ID []  Parks Neptune, Pharm.D., BCPS []  Alycia Rossetti, Pharm.D., BCPS []  [place], Pharm.D., BCPS, AAHIVP []  Legrand Como, Pharm.D., BCPS, AAHIVP []  Salome Arnt, PharmD, BCPS []  Johnnette Gourd, PharmD, BCPS []  Hughes Better, PharmD, BCPS []  Leeroy Cha, PharmD []  Laqueta Linden, PharmD, BCPS []  Albertina Parr, PharmD  [place] Team []  Leodis Sias, PharmD []  Lindell Spar, PharmD []  Royetta Asal, PharmD []  Graylin Shiver, Rph []  Rema Fendt) Glennon Mac, PharmD []  Arlyn Dunning, PharmD []  Netta Cedars, PharmD []  Dia Sitter, PharmD []  Leone Haven, PharmD []  Gretta Arab, PharmD []  Theodis Shove, PharmD []  Peggyann Juba, PharmD [x]  Reuel Boom, PharmD   Positive urine culture Treated with Cephalexin, organism sensitive to the same and no further patient follow-up is required at this time.  Harlon Flor [facility] 04/28/2019, 8:52 AM

## 2019-08-20 ENCOUNTER — Other Ambulatory Visit: Payer: Self-pay

## 2019-08-20 ENCOUNTER — Emergency Department (HOSPITAL_COMMUNITY)
Admission: EM | Admit: 2019-08-20 | Discharge: 2019-08-21 | Disposition: A | Discharge status: Home / Self Care | Payer: Medicare HMO | Attending: Emergency Medicine | Admitting: Emergency Medicine

## 2019-08-20 DIAGNOSIS — M545 Low back pain: Secondary | ICD-10-CM | POA: Diagnosis present

## 2019-08-20 DIAGNOSIS — Z79891 Long term (current) use of opiate analgesic: Secondary | ICD-10-CM | POA: Insufficient documentation

## 2019-08-20 DIAGNOSIS — I10 Essential (primary) hypertension: Secondary | ICD-10-CM | POA: Insufficient documentation

## 2019-08-20 DIAGNOSIS — Z79899 Other long term (current) drug therapy: Secondary | ICD-10-CM | POA: Diagnosis not present

## 2019-08-20 DIAGNOSIS — N3000 Acute cystitis without hematuria: Secondary | ICD-10-CM | POA: Insufficient documentation

## 2019-08-20 DIAGNOSIS — Z91040 Latex allergy status: Secondary | ICD-10-CM | POA: Insufficient documentation

## 2019-08-20 DIAGNOSIS — E039 Hypothyroidism, unspecified: Secondary | ICD-10-CM | POA: Diagnosis not present

## 2019-08-20 DIAGNOSIS — Z791 Long term (current) use of non-steroidal anti-inflammatories (NSAID): Secondary | ICD-10-CM | POA: Diagnosis not present

## 2019-08-20 DIAGNOSIS — Z7984 Long term (current) use of oral hypoglycemic drugs: Secondary | ICD-10-CM | POA: Insufficient documentation

## 2019-08-20 DIAGNOSIS — E119 Type 2 diabetes mellitus without complications: Secondary | ICD-10-CM | POA: Insufficient documentation

## 2019-08-20 NOTE — ED Triage Notes (Signed)
PER EMS: Patient is coming from home with a c/o possible UTI. Patients family wanted her to be seen for concerned UTI due to coloration of urine. Pt complains of bilateral leg and back pain. Pt has a sacral wound. No complaints of confusion or fever. Patient reported taking AZO.  EMS VITALS:  128/91 HR 86 SPO2 97% RA RR 18 CBG 280 TEMP 97.6

## 2019-08-21 ENCOUNTER — Encounter (HOSPITAL_COMMUNITY): Payer: Self-pay | Admitting: Emergency Medicine

## 2019-08-21 DIAGNOSIS — N3000 Acute cystitis without hematuria: Secondary | ICD-10-CM | POA: Diagnosis not present

## 2019-08-21 LAB — URINALYSIS, ROUTINE W REFLEX MICROSCOPIC
Bilirubin Urine: NEGATIVE
Glucose, UA: >=500 mg/dL — AB
Hgb urine dipstick: NEGATIVE
Ketones, ur: NEGATIVE mg/dL
Nitrite: NEGATIVE
Protein, ur: NEGATIVE mg/dL
Specific Gravity, Urine: 1.009 (ref 1.005–1.030)
pH: 5 (ref 5.0–8.0)

## 2019-08-21 MED ORDER — STERILE WATER FOR INJECTION IJ SOLN
INTRAMUSCULAR | Status: AC
  Administered 2019-08-21: 10 mL
  Filled 2019-08-21: qty 10

## 2019-08-21 MED ORDER — CEFTRIAXONE SODIUM 1 G IJ SOLR
1.0000 g | Freq: Once | INTRAMUSCULAR | Status: AC
  Administered 2019-08-21: 1 g via INTRAMUSCULAR
  Filled 2019-08-21: qty 10

## 2019-08-21 MED ORDER — CEPHALEXIN 500 MG PO CAPS: ORAL_CAPSULE | ORAL | 0 refills | Status: DC

## 2019-08-21 NOTE — ED Notes (Signed)
[employer] paged at [employer] for [facility] transport back to home residence.

## 2019-08-21 NOTE — ED Provider Notes (Addendum)
[facility] DEPT Provider Note   CSN: [id number] Arrival date & time: 08/20/19  2333     History No chief complaint on file.   Natalie Macdonald is a 64 y.o. female.  The history is provided by the patient.  Illness Location:  Urine Quality:  Discolored due to azo and smells funny Severity:  Moderate Onset quality:  Gradual Duration:  1 week Timing:  Constant Progression:  Unchanged Chronicity:  Recurrent Context:  Frequent UTIs Relieved by:  Nothing Worsened by:  Nothing Ineffective treatments:  None tried Associated symptoms: no abdominal pain, no chest pain, no congestion, no cough, no diarrhea, no ear pain, no fatigue, no fever, no headaches, no loss of consciousness, no myalgias, no nausea, no rash, no rhinorrhea, no shortness of breath, no sore throat, no vomiting and no wheezing   Risk factors:  Hemiplegia No AMS.  No f/c/r.  No n/v/d.  No cough.  No weakness.       Past Medical History:  Diagnosis Date  . Allergic rhinitis   . Arthritis    knees, shoulder  . Bronchitis    history  . Cellulitis of left foot   . Cervical disc disorder   . 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 ([place])   . Chronic osteomyelitis ([place])    left foot  . Chronic respiratory infection   . Diabetes mellitus without complication ([place])   . Diabetes mellitus, type II ([place])   . Diabetic foot ulcer ([place])    left foot  . Fibromyalgia   . GERD (gastroesophageal reflux disease)   . Headache    sinus  . History of blood transfusion   . Hypertension   . Hyponatremia   . hypothyroid   . Hypothyroidism   . Joint pain    bilateral legs  . Left hemiplegia ([place])   . Sarcoidosis   . Sepsis ([place])    HX  . Sepsis ([place])   . Uterine fibroid    hx  . Uterine fibroid     Patient Active Problem List   Diagnosis Date Noted  . Diarrhea 01/06/2018  . Sepsis ([place]) 01/03/2018  . Cellulitis 01/03/2018  . Sacral decubitus ulcer, stage III ([place])  01/03/2018  . AKI (acute kidney injury) ([place]) 01/03/2018  . Dehydration 01/03/2018  . Toxic encephalopathy 01/03/2018  . Adverse drug effect 01/03/2018  . Acute encephalopathy   . Hypoxemia   . Palliative care encounter   . Septic shock ([place])   . Acute respiratory failure with hypoxemia ([place])   . Pressure injury of skin 10/18/2016  . Cervical spondylosis with myelopathy 10/17/2016  . Chronic left-sided low back pain with left-sided sciatica 09/05/2016  . Cervicalgia 09/05/2016  . Diabetes mellitus type 2 with complications ([place]) [id number]  . Hyperlipidemia, unspecified 09/05/2016  . Hypertension 09/05/2016  . Sarcoidosis 09/05/2016  . Cervical disc disorder with myelopathy of mid-cervical region 03/13/2016  . Left hemiplegia ([place]) 02/07/2016  . Chronic osteomyelitis of ankle and foot, left ([place]) 09/25/2014  . Hyponatremia 09/25/2014  . Diabetic ulcer of left foot associated with type 2 diabetes mellitus ([place]) 08/30/2014  . Charcot's joint of foot due to diabetes ([place]) 10/15/2013  . Pain in joint involving ankle and foot 10/15/2013    Past Surgical History:  Procedure Laterality Date  . ANTERIOR CERVICAL DECOMP/DISCECTOMY FUSION N/A 10/17/2016   Procedure: ANTERIOR CERVICAL DECOMPRESSION/DISCECTOMY FUSION CERVICAL FIVE- CERVICAL SIX, CERVICAL SIX- CERVICAL SEVEN, POSSIBLE CERVICAL SIX CORPECTOMY;  Surgeon: Consuella Lose, MD;  Location: [facility] OR;  Service: Neurosurgery;  Laterality: N/A;  . ANTERIOR CERVICAL DECOMP/DISCECTOMY FUSION    . APPLICATION OF WOUND VAC Left    foot  . CHOLECYSTECTOMY    . FOOT SURGERY Bilateral    implants  . IRRIGATION AND DEBRIDEMENT FOOT Left    X2  . KNEE ARTHROPLASTY    . THYROIDECTOMY    . UTERINE FIBROID SURGERY       OB History   No obstetric history on file.     Family History  Problem Relation Age of Onset  . Throat cancer Mother     Social History   Tobacco Use  . Smoking status: Never Smoker  . Smokeless tobacco: Never  Used  Substance Use Topics  . Alcohol use: No  . Drug use: No    Home Medications Prior to Admission medications   Medication Sig Start Date End Date Taking? Authorizing Provider  amitriptyline (ELAVIL) 100 MG tablet Take 100 mg by mouth at bedtime.     [provider]  amLODipine-benazepril (LOTREL) 5-40 MG capsule Take 1 capsule by mouth daily.    [provider]  APIDRA 100 UNIT/ML injection Inject 18-25 Units into the skin 3 (three) times daily with meals. Sliding Scale 12/13/17   [provider]  baclofen (LIORESAL) 10 MG tablet Take 10 mg by mouth 3 (three) times daily as needed for muscle spasms.  10/03/16   [provider]  Calcium Carbonate (CALCIUM 600 PO) Take 600 mg by mouth daily.    [provider]  carvedilol (COREG) 25 MG tablet Take 25 mg by mouth 2 (two) times daily with a meal.    [provider]  cephALEXin (KEFLEX) 500 MG capsule Take 1 capsule (500 mg total) by mouth 4 (four) times daily. 04/24/19   Isla Pence, MD  cephALEXin (KEFLEX) 500 MG capsule 2 caps po bid x 7 days 08/21/19   Smantha Boakye, MD  cetirizine (ZYRTEC) 10 MG tablet Take 10 mg by mouth daily as needed for allergies.     [provider]  chlorthalidone (HYGROTON) 25 MG tablet Take 25 mg by mouth daily.    [provider]  CRANBERRY PO Take 2 capsules by mouth daily.    [provider]  diclofenac (VOLTAREN) 75 MG EC tablet Take 75 mg by mouth 2 (two) times daily. 12/05/17   [provider]  doxycycline (DORYX) 100 MG EC tablet Take 100 mg by mouth 2 (two) times daily. 01/24/18   [provider]  DULoxetine (CYMBALTA) 30 MG capsule Take 60 mg by mouth daily. 11/25/17   [provider]  fluticasone (FLONASE) 50 MCG/ACT nasal spray Place 2 sprays into both nostrils daily as needed for allergies.     [provider]  gabapentin (NEURONTIN) 300 MG capsule Take 300 mg by mouth 3 (three) times  daily as needed (for pain).     [provider]  levothyroxine (SYNTHROID, LEVOTHROID) 150 MCG tablet Take 150 mcg by mouth daily before breakfast.    [provider]  metFORMIN (GLUCOPHAGE) 500 MG tablet Take 500 mg by mouth 2 (two) times daily with a meal.    [provider]  morphine (MS CONTIN) 30 MG 12 hr tablet Take 30 mg by mouth 2 (two) times daily. 10/30/17   [provider]  Multiple Vitamin (MULTIVITAMIN) tablet Take 1 tablet by mouth daily.    [provider]  oxybutynin (DITROPAN XL) 15 MG 24 hr tablet Take 15  mg by mouth daily.  11/06/17   [provider]  Oxycodone HCl 10 MG TABS Take 10 mg by mouth every 8 (eight) hours as needed (for pain).  10/04/16   [provider]  pantoprazole (PROTONIX) 40 MG tablet Take 40 mg by mouth daily.    [provider]  pravastatin (PRAVACHOL) 20 MG tablet Take 20 mg by mouth daily.    [provider]  Probiotic Product (PROBIOTIC PO) Take 2 capsules by mouth daily.    [provider]  Pyridoxine HCl (VITAMIN B-6) 250 MG tablet Take 250 mg by mouth daily.    [provider]  ranitidine (ZANTAC) 75 MG tablet Take 150 mg by mouth daily as needed for heartburn.    [provider]  silver sulfADIAZINE (SILVADENE) 1 % cream Apply 1 application topically 2 (two) times daily.  01/15/18   [provider]  tiZANidine (ZANAFLEX) 4 MG tablet Take 4 mg by mouth every 8 (eight) hours as needed for muscle spasms.  12/05/17   [provider]  TOUJEO SOLOSTAR 300 UNIT/ML SOPN Inject 40-50 Units into the skin at bedtime. Sliding Scale 10/30/17   [provider]  vitamin B-12 (CYANOCOBALAMIN) 1000 MCG tablet Take 1,000 mcg by mouth daily.    [provider]  vitamin E 400 UNIT capsule Take 400 Units by mouth daily.    [provider]    Allergies    Grapefruit bioflavonoid complex, Latex, Procaine, Procaine, Grapefruit  flavor [flavoring agent], and Novolog [insulin aspart]  Review of Systems   Review of Systems  Constitutional: Negative for chills, fatigue and fever.  HENT: Negative for congestion, ear pain, rhinorrhea and sore throat.   Eyes: Negative for visual disturbance.  Respiratory: Negative for cough, shortness of breath and wheezing.   Cardiovascular: Negative for chest pain.  Gastrointestinal: Negative for abdominal pain, diarrhea, nausea and vomiting.  Genitourinary: Negative for decreased urine volume, dysuria, flank pain and hematuria.  Musculoskeletal: Negative for myalgias.  Skin: Negative for rash.  Neurological: Negative for loss of consciousness, syncope and headaches.  Psychiatric/Behavioral: Negative for agitation, confusion and decreased concentration.  All other systems reviewed and are negative.   Physical Exam Updated Vital Signs BP 116/72 (BP Location: Right Arm)   Pulse 85   Temp 97.6 F (36.4 C) (Oral)   Resp 16   LMP  (LMP Unknown)   SpO2 93%   Physical Exam Vitals and nursing note reviewed.  Constitutional:      General: She is not in acute distress.    Appearance: Normal appearance.  HENT:     Head: Normocephalic and atraumatic.     Nose: Nose normal.  Eyes:     Conjunctiva/sclera: Conjunctivae normal.     Pupils: Pupils are equal, round, and reactive to light.  Cardiovascular:     Rate and Rhythm: Normal rate and regular rhythm.     Pulses: Normal pulses.     Heart sounds: Normal heart sounds.  Pulmonary:     Effort: Pulmonary effort is normal.     Breath sounds: Normal breath sounds.  Abdominal:     General: Abdomen is flat. Bowel sounds are normal.     Tenderness: There is no abdominal tenderness. There is no guarding or rebound.  Musculoskeletal:        General: Normal range of motion.     Cervical back: Normal range of motion and neck supple.  Skin:    General: Skin is warm and dry.  Capillary Refill: Capillary refill takes less than 2  seconds.  Neurological:     General: No focal deficit present.     Mental Status: She is alert and oriented to person, place, and time.     Deep Tendon Reflexes: Reflexes normal.  Psychiatric:        Mood and Affect: Mood normal.        Behavior: Behavior normal.     ED Results / Procedures / Treatments   Labs (all labs ordered are listed, but only abnormal results are displayed) Results for orders placed or performed during the hospital encounter of 08/20/19  Urinalysis, Routine w reflex microscopic  Result Value Ref Range   Color, Urine AMBER (A) YELLOW   APPearance CLEAR CLEAR   Specific Gravity, Urine 1.009 1.005 - 1.030   pH 5.0 5.0 - 8.0   Glucose, UA >=500 (A) NEGATIVE mg/dL   Hgb urine dipstick NEGATIVE NEGATIVE   Bilirubin Urine NEGATIVE NEGATIVE   Ketones, ur NEGATIVE NEGATIVE mg/dL   Protein, ur NEGATIVE NEGATIVE mg/dL   Nitrite NEGATIVE NEGATIVE   Leukocytes,Ua SMALL (A) NEGATIVE   RBC / HPF 0-5 0 - 5 RBC/hpf   WBC, UA 11-20 0 - 5 WBC/hpf   Bacteria, UA FEW (A) NONE SEEN   Squamous Epithelial / LPF 0-5 0 - 5   Mucus PRESENT    Hyaline Casts, UA PRESENT    No results found.  EKG None  Radiology No results found.  Procedures Procedures (including critical care time)  Medications Ordered in ED Medications  cefTRIAXone (ROCEPHIN) injection 1 g (has no administration in time range)    ED Course  I have reviewed the triage vital signs and the nursing notes.  Pertinent labs & imaging results that were available during my care of the patient were reviewed by me and considered in my medical decision making (see chart for details).  No signs of sepsis.  Treated in the ED and RX for antibiotics sent to the pharmacy.  Strict return precautions given.   Final Clinical Impression(s) / ED Diagnoses Final diagnoses:  Acute cystitis without hematuria   Return for weakness, numbness, changes in vision or speech, fevers >100.4 unrelieved by medication,  shortness of breath, intractable vomiting, or diarrhea, abdominal pain, Inability to tolerate liquids or food, cough, altered mental status or any concerns. No signs of systemic illness or infection. The patient is nontoxic-appearing on exam and vital signs are within normal limits.   I have reviewed the triage vital signs and the nursing notes. Pertinent labs &imaging results that were available during my care of the patient were reviewed by me and considered in my medical decision making (see chart for details).  After history, exam, and medical workup I feel the patient has been appropriately medically screened and is safe for discharge home. Pertinent diagnoses were discussed with the patient. Patient was given return precautions Rx / DC Orders ED Discharge Orders         Ordered    cephALEXin (KEFLEX) 500 MG capsule     08/21/19 0108           Zamaria Brazzle, MD 08/21/19 0118    Murle Otting, MD 08/21/19 [id number]

## 2019-08-21 NOTE — ED Notes (Signed)
[facility] transport present to take patient home.

## 2019-09-04 ENCOUNTER — Emergency Department (HOSPITAL_COMMUNITY)
Admission: EM | Admit: 2019-09-04 | Discharge: 2019-09-05 | Disposition: A | Discharge status: Home / Self Care | Payer: Medicare HMO | Attending: Emergency Medicine | Admitting: Emergency Medicine

## 2019-09-04 ENCOUNTER — Other Ambulatory Visit: Payer: Self-pay

## 2019-09-04 ENCOUNTER — Encounter (HOSPITAL_COMMUNITY): Payer: Self-pay | Admitting: Emergency Medicine

## 2019-09-04 DIAGNOSIS — M5412 Radiculopathy, cervical region: Secondary | ICD-10-CM | POA: Insufficient documentation

## 2019-09-04 DIAGNOSIS — L89159 Pressure ulcer of sacral region, unspecified stage: Secondary | ICD-10-CM | POA: Insufficient documentation

## 2019-09-04 DIAGNOSIS — R1030 Lower abdominal pain, unspecified: Secondary | ICD-10-CM | POA: Insufficient documentation

## 2019-09-04 DIAGNOSIS — D869 Sarcoidosis, unspecified: Secondary | ICD-10-CM | POA: Diagnosis not present

## 2019-09-04 DIAGNOSIS — Z960 Presence of urogenital implants: Secondary | ICD-10-CM | POA: Insufficient documentation

## 2019-09-04 DIAGNOSIS — N390 Urinary tract infection, site not specified: Secondary | ICD-10-CM | POA: Diagnosis present

## 2019-09-04 DIAGNOSIS — R202 Paresthesia of skin: Secondary | ICD-10-CM | POA: Insufficient documentation

## 2019-09-04 DIAGNOSIS — Z7984 Long term (current) use of oral hypoglycemic drugs: Secondary | ICD-10-CM | POA: Insufficient documentation

## 2019-09-04 DIAGNOSIS — Z79899 Other long term (current) drug therapy: Secondary | ICD-10-CM | POA: Diagnosis not present

## 2019-09-04 DIAGNOSIS — R200 Anesthesia of skin: Secondary | ICD-10-CM | POA: Insufficient documentation

## 2019-09-04 DIAGNOSIS — I10 Essential (primary) hypertension: Secondary | ICD-10-CM | POA: Insufficient documentation

## 2019-09-04 DIAGNOSIS — Z981 Arthrodesis status: Secondary | ICD-10-CM | POA: Insufficient documentation

## 2019-09-04 DIAGNOSIS — M4712 Other spondylosis with myelopathy, cervical region: Secondary | ICD-10-CM | POA: Diagnosis not present

## 2019-09-04 DIAGNOSIS — E119 Type 2 diabetes mellitus without complications: Secondary | ICD-10-CM | POA: Diagnosis not present

## 2019-09-04 LAB — CBC WITH DIFFERENTIAL/PLATELET
Abs Immature Granulocytes: 0.08 10*3/uL — ABNORMAL HIGH (ref 0.00–0.07)
Basophils Absolute: 0 10*3/uL (ref 0.0–0.1)
Basophils Relative: 0 %
Eosinophils Absolute: 0.2 10*3/uL (ref 0.0–0.5)
Eosinophils Relative: 2 %
HCT: 38.8 % (ref 36.0–46.0)
Hemoglobin: 12 g/dL (ref 12.0–15.0)
Immature Granulocytes: 1 %
Lymphocytes Relative: 29 %
Lymphs Abs: 4 10*3/uL (ref 0.7–4.0)
MCH: 29.3 pg (ref 26.0–34.0)
MCHC: 30.9 g/dL (ref 30.0–36.0)
MCV: 94.6 fL (ref 80.0–100.0)
Monocytes Absolute: 1 10*3/uL (ref 0.1–1.0)
Monocytes Relative: 8 %
Neutro Abs: 8.3 10*3/uL — ABNORMAL HIGH (ref 1.7–7.7)
Neutrophils Relative %: 60 %
Platelets: 239 10*3/uL (ref 150–400)
RBC: 4.1 MIL/uL (ref 3.87–5.11)
RDW: 14.3 % (ref 11.5–15.5)
WBC: 13.6 10*3/uL — ABNORMAL HIGH (ref 4.0–10.5)
nRBC: 0 % (ref 0.0–0.2)

## 2019-09-04 LAB — COMPREHENSIVE METABOLIC PANEL
ALT: 22 U/L (ref 0–44)
AST: 23 U/L (ref 15–41)
Albumin: 3.4 g/dL — ABNORMAL LOW (ref 3.5–5.0)
Alkaline Phosphatase: 93 U/L (ref 38–126)
Anion gap: 8 (ref 5–15)
BUN: 23 mg/dL (ref 8–23)
CO2: 26 mmol/L (ref 22–32)
Calcium: 8.9 mg/dL (ref 8.9–10.3)
Chloride: 102 mmol/L (ref 98–111)
Creatinine, Ser: 1.09 mg/dL — ABNORMAL HIGH (ref 0.44–1.00)
GFR calc Af Amer: >60 mL/min (ref >60)
GFR calc non Af Amer: 54 mL/min — ABNORMAL LOW (ref >60)
Glucose, Bld: 319 mg/dL — ABNORMAL HIGH (ref 70–99)
Potassium: 4.3 mmol/L (ref 3.5–5.1)
Sodium: 136 mmol/L (ref 135–145)
Total Bilirubin: 0.5 mg/dL (ref 0.3–1.2)
Total Protein: 7.4 g/dL (ref 6.5–8.1)

## 2019-09-04 NOTE — ED Triage Notes (Signed)
Per EMS, patient from home, reports sx of UTI x3 weeks. Seen for same x2 weeks ago. Dx with UTI. Reports taking abx as prescribed without relief. Had virtual visit with PCP and told to come to ED for further evaluation. Patient is bed bound. Foley in place.

## 2019-09-05 ENCOUNTER — Emergency Department (HOSPITAL_COMMUNITY): Payer: Medicare HMO

## 2019-09-05 DIAGNOSIS — R200 Anesthesia of skin: Secondary | ICD-10-CM | POA: Diagnosis not present

## 2019-09-05 LAB — URINALYSIS, ROUTINE W REFLEX MICROSCOPIC
Bilirubin Urine: NEGATIVE
Glucose, UA: >=500 mg/dL — AB
Ketones, ur: NEGATIVE mg/dL
Nitrite: NEGATIVE
Protein, ur: NEGATIVE mg/dL
Specific Gravity, Urine: 1.015 (ref 1.005–1.030)
pH: 6 (ref 5.0–8.0)

## 2019-09-05 LAB — C-REACTIVE PROTEIN: CRP: 2.2 mg/dL — ABNORMAL HIGH (ref <1.0)

## 2019-09-05 LAB — SEDIMENTATION RATE: Sed Rate: 23 mm/hr — ABNORMAL HIGH (ref 0–22)

## 2019-09-05 LAB — URINE CULTURE

## 2019-09-05 LAB — LACTIC ACID, PLASMA
Lactic Acid, Venous: 2.2 mmol/L (ref 0.5–1.9)
Lactic Acid, Venous: 1.1 mmol/L (ref 0.5–1.9)

## 2019-09-05 MED ORDER — SULFAMETHOXAZOLE-TRIMETHOPRIM 800-160 MG PO TABS: 1.0000 | ORAL_TABLET | Freq: Two times a day (BID) | ORAL | 0 refills | Status: AC

## 2019-09-05 MED ORDER — SULFAMETHOXAZOLE-TRIMETHOPRIM 800-160 MG PO TABS
1.0000 | ORAL_TABLET | Freq: Once | ORAL | Status: AC
  Administered 2019-09-05: 1 via ORAL
  Filled 2019-09-05: qty 1

## 2019-09-05 MED ORDER — ACETAMINOPHEN 500 MG PO TABS
1000.0000 mg | ORAL_TABLET | Freq: Once | ORAL | Status: AC
  Administered 2019-09-05: 1000 mg via ORAL
  Filled 2019-09-05: qty 2

## 2019-09-05 MED ORDER — LORAZEPAM 2 MG/ML IJ SOLN
1.0000 mg | Freq: Once | INTRAMUSCULAR | Status: AC
  Administered 2019-09-05: 08:00:00 1 mg via INTRAVENOUS
  Filled 2019-09-05: qty 1

## 2019-09-05 MED ORDER — FLUCONAZOLE 150 MG PO TABS
150.0000 mg | ORAL_TABLET | Freq: Once | ORAL | Status: AC
  Administered 2019-09-05: 08:00:00 150 mg via ORAL
  Filled 2019-09-05: qty 1

## 2019-09-05 MED ORDER — ACETAMINOPHEN 325 MG PO TABS
650.0000 mg | ORAL_TABLET | Freq: Once | ORAL | Status: AC
  Administered 2019-09-05: 650 mg via ORAL
  Filled 2019-09-05: qty 2

## 2019-09-05 MED ORDER — GADOBUTROL 1 MMOL/ML IV SOLN
10.0000 mL | Freq: Once | INTRAVENOUS | Status: AC | PRN
  Administered 2019-09-05: 10 mL via INTRAVENOUS

## 2019-09-05 MED ORDER — FLUCONAZOLE 150 MG PO TABS: 150.0000 mg | ORAL_TABLET | Freq: Every day | ORAL | 0 refills | Status: DC

## 2019-09-05 NOTE — ED Provider Notes (Signed)
I assumed care of this patient from Dr. Kathrynn Humble.  Please see their note for further details of Hx, PE.  Briefly patient is a 64 y.o. female who presented suprapubic discomfort concerning for persistent urinary tract infection.  Pending labs and UA.  Patient also complaining of bilateral upper and lower extremity numbness and tingling.  History of cervical fusion  UA with evidence of infection.  Patient previously grew out E. coli and Proteus which was nearly pan sensitive.  Was previously treated with Keflex.  Will treat with Bactrim.  Patient also has exam findings concerning for fungal vulvovaginitis.  Fluconazole given.  Regarding the upper and lower extremity numbness and tingling, will obtain MRI of the cervical spine to assess for any cord compression.  Patient care turned over to Dr. Sedonia Small. Patient case and results discussed in detail; please see their note for further ED managment.          Fatima Blank, MD 09/05/19 [phone]

## 2019-09-05 NOTE — Discharge Instructions (Addendum)
You were evaluated in the Emergency Department and after careful evaluation, we did not find any emergent condition requiring admission or further testing in the hospital.  Your exam/testing today is overall reassuring.  Please take the Bactrim antibiotic as directed for your urinary tract infection.  As discussed, if you are continuing to have yeast issues over the next several days, you can take the fluconazole pill.  We discussed your MRI with the neurosurgeons, and they feel you are appropriate for follow-up with your neurosurgeon.  Please call the number provided to schedule an appointment.  Please return to the Emergency Department if you experience any worsening of your condition.  We encourage you to follow up with a primary care provider.  Thank you for allowing [place] to be a part of your care.

## 2019-09-05 NOTE — ED Provider Notes (Signed)
  Provider Note MRN:  [id number]  Arrival date & time: 09/05/19    ED Course and Medical Decision Making  Assumed care from Dr. Leonette Monarch at shift change.  Known UTI receiving Bactrim, endorsing upper and lower extremity numbness, awaiting MRI cervical spine.  11:29 AM update: MRI revealing enhancement, unclear if acute or chronic.  Patient's symptoms of decreased sensation present for 2 weeks, doubt need for urgent or emergent intervention.  MRI results discussed with [name] of neurosurgery, agrees with follow-up with her surgeon Dr. Kathyrn Sheriff.  Procedures  Final Clinical Impressions(s) / ED Diagnoses     ICD-10-CM   1. Numbness  R20.0   2. Lower urinary tract infectious disease  N39.0     ED Discharge Orders         Ordered    sulfamethoxazole-trimethoprim (BACTRIM DS) 800-160 MG tablet  2 times daily     09/05/19 1127    fluconazole (DIFLUCAN) 150 MG tablet  Daily     09/05/19 1127            Discharge Instructions     You were evaluated in the Emergency Department and after careful evaluation, we did not find any emergent condition requiring admission or further testing in the hospital.  Your exam/testing today is overall reassuring.  Please take the Bactrim antibiotic as directed for your urinary tract infection.  As discussed, if you are continuing to have yeast issues over the next several days, you can take the fluconazole pill.  We discussed your MRI with the neurosurgeons, and they feel you are appropriate for follow-up with your neurosurgeon.  Please call the number provided to schedule an appointment.  Please return to the Emergency Department if you experience any worsening of your condition.  We encourage you to follow up with a primary care provider.  Thank you for allowing [place] to be a part of your care.     Barth Kirks. Sedonia Small, [place] [email or web address]    Maudie Flakes, MD 09/05/19 1130

## 2019-09-05 NOTE — ED Notes (Signed)
Removed both IVs in pts right arm (hand, AC)

## 2019-09-05 NOTE — ED Notes (Signed)
Gave pt a ham sandwich and sprite

## 2019-09-05 NOTE — ED Provider Notes (Signed)
[facility] DEPT Provider Note   CSN: [id number] Arrival date & time: 09/04/19  2103     History Chief Complaint  Patient presents with  . Urinary Tract Infection    Natalie Macdonald is a 64 y.o. female.  HPI    64 year old female comes in a chief complaint of bladder infection. Patient has history of diabetes, stroke, sarcoidosis and left hemiplegia.  She also has a sacral pressure ulcer.  She reports that she has history of multiple UTIs because of her indwelling Foley catheter.  She was treated for UTI few days back, however, she does not think the antibiotics helped her.  Over the last 3 days she has been having some numbness along with persistent back pain and her urine is foul-smelling and there is more debris.  She also thinks there is yeast infection.  Review of system is negative for any bowel incontinence, saddle anesthesia and she denies any new weakness in her legs.  She does have some numbness type feeling which is new.  She also denies any fevers, chills.  Patient has home health nurse that has been managing her wound.  They have been telling her that the wound is getting better.  Past Medical History:  Diagnosis Date  . Allergic rhinitis   . Arthritis    knees, shoulder  . Bronchitis    history  . Cellulitis of left foot   . Cervical disc disorder   . 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 ([place])   . Chronic osteomyelitis ([place])    left foot  . Chronic respiratory infection   . Diabetes mellitus without complication ([place])   . Diabetes mellitus, type II ([place])   . Diabetic foot ulcer ([place])    left foot  . Fibromyalgia   . GERD (gastroesophageal reflux disease)   . Headache    sinus  . History of blood transfusion   . Hypertension   . Hyponatremia   . hypothyroid   . Hypothyroidism   . Joint pain    bilateral legs  . Left hemiplegia ([place])   . Sarcoidosis   . Sepsis ([place])    HX  . Sepsis ([place])   . Uterine  fibroid    hx  . Uterine fibroid     Patient Active Problem List   Diagnosis Date Noted  . Diarrhea 01/06/2018  . Sepsis ([place]) 01/03/2018  . Cellulitis 01/03/2018  . Sacral decubitus ulcer, stage III ([place]) 01/03/2018  . AKI (acute kidney injury) ([place]) 01/03/2018  . Dehydration 01/03/2018  . Toxic encephalopathy 01/03/2018  . Adverse drug effect 01/03/2018  . Acute encephalopathy   . Hypoxemia   . Palliative care encounter   . Septic shock ([place])   . Acute respiratory failure with hypoxemia ([place])   . Pressure injury of skin 10/18/2016  . Cervical spondylosis with myelopathy 10/17/2016  . Chronic left-sided low back pain with left-sided sciatica 09/05/2016  . Cervicalgia 09/05/2016  . Diabetes mellitus type 2 with complications ([place]) [id number]  . Hyperlipidemia, unspecified 09/05/2016  . Hypertension 09/05/2016  . Sarcoidosis 09/05/2016  . Cervical disc disorder with myelopathy of mid-cervical region 03/13/2016  . Left hemiplegia ([place]) 02/07/2016  . Chronic osteomyelitis of ankle and foot, left ([place]) 09/25/2014  . Hyponatremia 09/25/2014  . Diabetic ulcer of left foot associated with type 2 diabetes mellitus ([place]) 08/30/2014  . Charcot's joint of foot due to diabetes ([place]) 10/15/2013  . Pain in joint involving ankle and foot 10/15/2013  Past Surgical History:  Procedure Laterality Date  . ANTERIOR CERVICAL DECOMP/DISCECTOMY FUSION N/A 10/17/2016   Procedure: ANTERIOR CERVICAL DECOMPRESSION/DISCECTOMY FUSION CERVICAL FIVE- CERVICAL SIX, CERVICAL SIX- CERVICAL SEVEN, POSSIBLE CERVICAL SIX CORPECTOMY;  Surgeon: Consuella Lose, MD;  Location: [facility];  Service: Neurosurgery;  Laterality: N/A;  . ANTERIOR CERVICAL DECOMP/DISCECTOMY FUSION    . APPLICATION OF WOUND VAC Left    foot  . CHOLECYSTECTOMY    . FOOT SURGERY Bilateral    implants  . IRRIGATION AND DEBRIDEMENT FOOT Left    X2  . KNEE ARTHROPLASTY    . THYROIDECTOMY    . UTERINE FIBROID SURGERY       OB History    No obstetric history on file.     Family History  Problem Relation Age of Onset  . Throat cancer Mother     Social History   Tobacco Use  . Smoking status: Never Smoker  . Smokeless tobacco: Never Used  Substance Use Topics  . Alcohol use: No  . Drug use: No    Home Medications Prior to Admission medications   Medication Sig Start Date End Date Taking? Authorizing Provider  amitriptyline (ELAVIL) 100 MG tablet Take 100 mg by mouth at bedtime.    Yes [provider]  amLODipine-benazepril (LOTREL) 5-40 MG capsule Take 1 capsule by mouth daily.   Yes [provider]  APIDRA 100 UNIT/ML injection Inject 18-25 Units into the skin 3 (three) times daily with meals. Sliding Scale 12/13/17  Yes [provider]  baclofen (LIORESAL) 10 MG tablet Take 10 mg by mouth 3 (three) times daily as needed for muscle spasms.  10/03/16  Yes [provider]  Calcium Carbonate (CALCIUM 600 PO) Take 600 mg by mouth daily.   Yes [provider]  carvedilol (COREG) 25 MG tablet Take 25 mg by mouth 2 (two) times daily with a meal.   Yes [provider]  cephALEXin (KEFLEX) 500 MG capsule 2 caps po bid x 7 days Patient taking differently: Take 1,000 mg by mouth 2 (two) times daily. x 7 days 08/21/19  Yes Palumbo, April, MD  cetirizine (ZYRTEC) 10 MG tablet Take 10 mg by mouth daily as needed for allergies.    Yes [provider]  chlorthalidone (HYGROTON) 25 MG tablet Take 25 mg by mouth daily.   Yes [provider]  CRANBERRY PO Take 2 capsules by mouth daily.   Yes [provider]  diclofenac (VOLTAREN) 75 MG EC tablet Take 75 mg by mouth 2 (two) times daily. 12/05/17  Yes [provider]  doxycycline (DORYX) 100 MG EC tablet Take 100 mg by mouth 2 (two) times daily. 01/24/18  Yes [provider]  DULoxetine (CYMBALTA) 30 MG capsule Take 60 mg by mouth daily. 11/25/17  Yes [provider]  fluticasone  (FLONASE) 50 MCG/ACT nasal spray Place 2 sprays into both nostrils daily as needed for allergies.    Yes [provider]  gabapentin (NEURONTIN) 300 MG capsule Take 300 mg by mouth 3 (three) times daily as needed (for pain).    Yes [provider]  levothyroxine (SYNTHROID, LEVOTHROID) 150 MCG tablet Take 150 mcg by mouth daily before breakfast.   Yes [provider]  Multiple Vitamin (MULTIVITAMIN) tablet Take 1 tablet by mouth daily.   Yes [provider]  Oxycodone HCl 10 MG TABS Take 20 mg by mouth every 4 (four) hours as needed (for pain).  10/04/16  Yes [provider]  pantoprazole (  PROTONIX) 40 MG tablet Take 40 mg by mouth daily.   Yes [provider]  pravastatin (PRAVACHOL) 20 MG tablet Take 20 mg by mouth daily.   Yes [provider]  Probiotic Product (PROBIOTIC PO) Take 2 capsules by mouth daily.   Yes [provider]  Pyridoxine HCl (VITAMIN B-6) 250 MG tablet Take 250 mg by mouth daily.   Yes [provider]  ranitidine (ZANTAC) 75 MG tablet Take 150 mg by mouth daily as needed for heartburn.   Yes [provider]  silver sulfADIAZINE (SILVADENE) 1 % cream Apply 1 application topically 2 (two) times daily.  01/15/18  Yes [provider]  tiZANidine (ZANAFLEX) 4 MG tablet Take 4 mg by mouth every 8 (eight) hours as needed for muscle spasms.  12/05/17  Yes [provider]  TOUJEO SOLOSTAR 300 UNIT/ML SOPN Inject 40-50 Units into the skin at bedtime. Sliding Scale 10/30/17  Yes [provider]  vitamin B-12 (CYANOCOBALAMIN) 1000 MCG tablet Take 1,000 mcg by mouth daily.   Yes [provider]  vitamin E 400 UNIT capsule Take 400 Units by mouth daily.   Yes [provider]  cephALEXin (KEFLEX) 500 MG capsule Take 1 capsule (500 mg total) by mouth 4 (four) times daily. Patient not taking: Reported on 09/04/2019 04/24/19   Isla Pence, MD    Allergies      Grapefruit bioflavonoid complex, Latex, Procaine, Procaine, Grapefruit flavor [flavoring agent], Novolog [insulin aspart], and Penicillins  Review of Systems   Review of Systems  Constitutional: Positive for activity change.  Respiratory: Negative for shortness of breath.   Cardiovascular: Negative for chest pain.  Gastrointestinal: Negative for nausea.  Allergic/Immunologic: Negative for immunocompromised state.  Neurological: Positive for numbness.  Hematological: Does not bruise/bleed easily.  All other systems reviewed and are negative.   Physical Exam Updated Vital Signs BP (!) 98/49 (BP Location: Right Arm)   Pulse 98   Temp 98.3 F (36.8 C) (Oral)   Resp 20   LMP  (LMP Unknown)   SpO2 96%   Physical Exam Vitals and nursing note reviewed.  Constitutional:      Appearance: She is well-developed.  HENT:     Head: Normocephalic and atraumatic.  Cardiovascular:     Rate and Rhythm: Tachycardia present.  Pulmonary:     Effort: Pulmonary effort is normal.  Abdominal:     General: Bowel sounds are normal.  Musculoskeletal:     Cervical back: Normal range of motion and neck supple.     Comments: Large pressure ulcer over the sacrum.  The wound looks clean, no purulence appreciated, no surrounding cellulitis appreciated.  Skin:    General: Skin is warm and dry.  Neurological:     Mental Status: She is alert and oriented to person, place, and time.     ED Results / Procedures / Treatments   Labs (all labs ordered are listed, but only abnormal results are displayed) Labs Reviewed  COMPREHENSIVE METABOLIC PANEL - Abnormal; Notable for the following components:      Result Value   Glucose, Bld 319 (*)    Creatinine, Ser 1.09 (*)    Albumin 3.4 (*)    GFR calc non Af Amer 54 (*)    All other components within normal limits  CBC WITH DIFFERENTIAL/PLATELET - Abnormal; Notable for the following components:   WBC 13.6 (*)    Neutro Abs 8.3 (*)    Abs Immature  Granulocytes 0.08 (*)  All other components within normal limits  URINE CULTURE  CULTURE, BLOOD (ROUTINE X 2)  CULTURE, BLOOD (ROUTINE X 2)  SEDIMENTATION RATE  C-REACTIVE PROTEIN  URINALYSIS, ROUTINE W REFLEX MICROSCOPIC  LACTIC ACID, PLASMA  LACTIC ACID, PLASMA    EKG None  Radiology No results found.  Procedures Procedures (including critical care time)  Medications Ordered in ED Medications - No data to display  ED Course  I have reviewed the triage vital signs and the nursing notes.  Pertinent labs & imaging results that were available during my care of the patient were reviewed by me and considered in my medical decision making (see chart for details).    MDM Rules/Calculators/A&P                      64 year old comes in a chief complaint of lower abdominal pain, back pain.  She also complains of foul-smelling urine and increased debris in her catheter.  Patient has history of diabetes, left-sided hemiplegia and chronic sacral pressure ulcer.  She also has history of recurrent UTI and suspects that she is having UTI again.  She also thinks it could be a yeast infection.  Of note, patient was treated for UTI just last week.  She denies any improvement with the antibiotics.  Additionally she is having numbness in her legs, which is new.  We considered UTI in the differential diagnosis along with pyelonephritis.  Other possibilities that we have considered include osteomyelitis, discitis, epidural abscess.  She has no red flags on history suggestive of cord compression or involvement, however her exam is limited because of her left-sided hemiplegia, Foley catheter in place.  Plan is to get basic labs. Her care will be transferred to Dr. Leonette Monarch. If her urine is appearing normal, and her sed rate, CRP are elevated, then we should get an MRI of her lumbar and sacral spine to rule out epidural abscess/discitis/osteomyelitis.  Clinically patient is not septic at this time.   Nursing staff informed that the lactic acid is delayed. Dr. Leonette Monarch to follow-up on the results. Final Clinical Impression(s) / ED Diagnoses Final diagnoses:  None    Rx / DC Orders ED Discharge Orders    None       Varney Biles, MD 09/05/19 0126

## 2019-09-05 NOTE — ED Notes (Signed)
[facility] called to transport patient back home.

## 2019-09-05 NOTE — ED Notes (Signed)
Patient transported to MRI 

## 2019-09-07 ENCOUNTER — Telehealth (HOSPITAL_BASED_OUTPATIENT_CLINIC_OR_DEPARTMENT_OTHER): Payer: Self-pay | Admitting: Emergency Medicine

## 2019-09-07 NOTE — ED Provider Notes (Signed)
Received culture report.  1 aerobic bottle positive for gram-positive rods.  Reviewed record.  Urine culture grew multiple species on day of visit.  White count mildly elevated.  Sed rate and CRP both mildly elevated.  History of sarcoidosis, sepsis, decubitus ulcers.  MRI of cervical spine showed some abnormalities but unknown if acute or chronic. I think the results could easily be contaminant from her normal skin flora, however with her sarcoidosis, potential immunosuppression and multiple comorbidities also potentially could be significant.  I think she would benefit from being a recheck in the ER with potential lab recheck.   Davonna Belling, MD 09/07/19 [phone]

## 2019-09-08 ENCOUNTER — Other Ambulatory Visit: Payer: Self-pay

## 2019-09-08 ENCOUNTER — Telehealth (HOSPITAL_BASED_OUTPATIENT_CLINIC_OR_DEPARTMENT_OTHER): Payer: Self-pay | Admitting: Emergency Medicine

## 2019-09-08 ENCOUNTER — Encounter (HOSPITAL_COMMUNITY): Payer: Self-pay | Admitting: Emergency Medicine

## 2019-09-08 ENCOUNTER — Observation Stay (HOSPITAL_COMMUNITY)
Admission: EM | Admit: 2019-09-08 | Discharge: 2019-09-09 | Disposition: A | Discharge status: Home / Self Care | Payer: Medicare HMO | Attending: Family Medicine | Admitting: Family Medicine

## 2019-09-08 ENCOUNTER — Emergency Department (HOSPITAL_COMMUNITY): Payer: Medicare HMO

## 2019-09-08 DIAGNOSIS — N3000 Acute cystitis without hematuria: Secondary | ICD-10-CM | POA: Diagnosis present

## 2019-09-08 DIAGNOSIS — M797 Fibromyalgia: Secondary | ICD-10-CM | POA: Diagnosis not present

## 2019-09-08 DIAGNOSIS — Z800 Family history of malignant neoplasm of digestive organs: Secondary | ICD-10-CM | POA: Insufficient documentation

## 2019-09-08 DIAGNOSIS — B3741 Candidal cystitis and urethritis: Principal | ICD-10-CM | POA: Diagnosis present

## 2019-09-08 DIAGNOSIS — M4802 Spinal stenosis, cervical region: Secondary | ICD-10-CM | POA: Insufficient documentation

## 2019-09-08 DIAGNOSIS — Z87440 Personal history of urinary (tract) infections: Secondary | ICD-10-CM | POA: Insufficient documentation

## 2019-09-08 DIAGNOSIS — G8194 Hemiplegia, unspecified affecting left nondominant side: Secondary | ICD-10-CM | POA: Diagnosis present

## 2019-09-08 DIAGNOSIS — L89153 Pressure ulcer of sacral region, stage 3: Secondary | ICD-10-CM | POA: Diagnosis not present

## 2019-09-08 DIAGNOSIS — Z888 Allergy status to other drugs, medicaments and biological substances status: Secondary | ICD-10-CM | POA: Insufficient documentation

## 2019-09-08 DIAGNOSIS — Z880 Allergy status to penicillin: Secondary | ICD-10-CM | POA: Diagnosis not present

## 2019-09-08 DIAGNOSIS — Z91040 Latex allergy status: Secondary | ICD-10-CM | POA: Diagnosis not present

## 2019-09-08 DIAGNOSIS — R7881 Bacteremia: Secondary | ICD-10-CM | POA: Diagnosis not present

## 2019-09-08 DIAGNOSIS — M5412 Radiculopathy, cervical region: Secondary | ICD-10-CM | POA: Diagnosis not present

## 2019-09-08 DIAGNOSIS — R519 Headache, unspecified: Secondary | ICD-10-CM | POA: Diagnosis not present

## 2019-09-08 DIAGNOSIS — Z79899 Other long term (current) drug therapy: Secondary | ICD-10-CM | POA: Diagnosis not present

## 2019-09-08 DIAGNOSIS — Z91018 Allergy to other foods: Secondary | ICD-10-CM | POA: Insufficient documentation

## 2019-09-08 DIAGNOSIS — E118 Type 2 diabetes mellitus with unspecified complications: Secondary | ICD-10-CM

## 2019-09-08 DIAGNOSIS — Z20822 Contact with and (suspected) exposure to covid-19: Secondary | ICD-10-CM | POA: Insufficient documentation

## 2019-09-08 DIAGNOSIS — Z884 Allergy status to anesthetic agent status: Secondary | ICD-10-CM | POA: Insufficient documentation

## 2019-09-08 DIAGNOSIS — Z981 Arthrodesis status: Secondary | ICD-10-CM | POA: Insufficient documentation

## 2019-09-08 DIAGNOSIS — E785 Hyperlipidemia, unspecified: Secondary | ICD-10-CM | POA: Diagnosis not present

## 2019-09-08 DIAGNOSIS — D869 Sarcoidosis, unspecified: Secondary | ICD-10-CM | POA: Diagnosis present

## 2019-09-08 DIAGNOSIS — Z9049 Acquired absence of other specified parts of digestive tract: Secondary | ICD-10-CM | POA: Insufficient documentation

## 2019-09-08 DIAGNOSIS — A5216 Charcot's arthropathy (tabetic): Secondary | ICD-10-CM | POA: Insufficient documentation

## 2019-09-08 DIAGNOSIS — K219 Gastro-esophageal reflux disease without esophagitis: Secondary | ICD-10-CM | POA: Insufficient documentation

## 2019-09-08 DIAGNOSIS — I10 Essential (primary) hypertension: Secondary | ICD-10-CM | POA: Diagnosis present

## 2019-09-08 DIAGNOSIS — E11610 Type 2 diabetes mellitus with diabetic neuropathic arthropathy: Secondary | ICD-10-CM | POA: Insufficient documentation

## 2019-09-08 DIAGNOSIS — E1169 Type 2 diabetes mellitus with other specified complication: Secondary | ICD-10-CM | POA: Diagnosis present

## 2019-09-08 DIAGNOSIS — M1990 Unspecified osteoarthritis, unspecified site: Secondary | ICD-10-CM | POA: Insufficient documentation

## 2019-09-08 DIAGNOSIS — E890 Postprocedural hypothyroidism: Secondary | ICD-10-CM | POA: Insufficient documentation

## 2019-09-08 DIAGNOSIS — Z881 Allergy status to other antibiotic agents status: Secondary | ICD-10-CM | POA: Insufficient documentation

## 2019-09-08 DIAGNOSIS — I159 Secondary hypertension, unspecified: Secondary | ICD-10-CM

## 2019-09-08 LAB — CBC
HCT: 40.5 % (ref 36.0–46.0)
Hemoglobin: 12.8 g/dL (ref 12.0–15.0)
MCH: 29.4 pg (ref 26.0–34.0)
MCHC: 31.6 g/dL (ref 30.0–36.0)
MCV: 92.9 fL (ref 80.0–100.0)
Platelets: 275 10*3/uL (ref 150–400)
RBC: 4.36 MIL/uL (ref 3.87–5.11)
RDW: 13.6 % (ref 11.5–15.5)
WBC: 9.9 10*3/uL (ref 4.0–10.5)
nRBC: 0 % (ref 0.0–0.2)

## 2019-09-08 LAB — URINALYSIS, ROUTINE W REFLEX MICROSCOPIC
Bilirubin Urine: NEGATIVE
Glucose, UA: NEGATIVE mg/dL
Hgb urine dipstick: NEGATIVE
Ketones, ur: NEGATIVE mg/dL
Nitrite: NEGATIVE
Protein, ur: NEGATIVE mg/dL
RBC / HPF: >50 RBC/hpf — ABNORMAL HIGH (ref 0–5)
Specific Gravity, Urine: 1.008 (ref 1.005–1.030)
WBC, UA: >50 WBC/hpf — ABNORMAL HIGH (ref 0–5)
pH: 7 (ref 5.0–8.0)

## 2019-09-08 LAB — COMPREHENSIVE METABOLIC PANEL
ALT: 23 U/L (ref 0–44)
AST: 20 U/L (ref 15–41)
Albumin: 3.8 g/dL (ref 3.5–5.0)
Alkaline Phosphatase: 88 U/L (ref 38–126)
Anion gap: 9 (ref 5–15)
BUN: 12 mg/dL (ref 8–23)
CO2: 25 mmol/L (ref 22–32)
Calcium: 9.6 mg/dL (ref 8.9–10.3)
Chloride: 102 mmol/L (ref 98–111)
Creatinine, Ser: 0.68 mg/dL (ref 0.44–1.00)
GFR calc Af Amer: >60 mL/min (ref >60)
GFR calc non Af Amer: >60 mL/min (ref >60)
Glucose, Bld: 122 mg/dL — ABNORMAL HIGH (ref 70–99)
Potassium: 3.9 mmol/L (ref 3.5–5.1)
Sodium: 136 mmol/L (ref 135–145)
Total Bilirubin: 0.4 mg/dL (ref 0.3–1.2)
Total Protein: 8.1 g/dL (ref 6.5–8.1)

## 2019-09-08 LAB — CBG MONITORING, ED: Glucose-Capillary: 172 mg/dL — ABNORMAL HIGH (ref 70–99)

## 2019-09-08 LAB — LACTIC ACID, PLASMA: Lactic Acid, Venous: 1.7 mmol/L (ref 0.5–1.9)

## 2019-09-08 MED ORDER — INSULIN REGULAR HUMAN 100 UNIT/ML IJ SOLN
0.0000 [IU] | INTRAMUSCULAR | Status: DC
  Filled 2019-09-08: qty 10

## 2019-09-08 MED ORDER — ACETAMINOPHEN 500 MG PO TABS
1000.0000 mg | ORAL_TABLET | Freq: Once | ORAL | Status: AC
  Administered 2019-09-08: 22:00:00 1000 mg via ORAL
  Filled 2019-09-08: qty 2

## 2019-09-08 MED ORDER — SODIUM CHLORIDE 0.9 % IV SOLN
2.0000 g | Freq: Once | INTRAVENOUS | Status: AC
  Administered 2019-09-08: 2 g via INTRAVENOUS
  Filled 2019-09-08: qty 20

## 2019-09-08 NOTE — ED Triage Notes (Signed)
64 yo female BIB [facility] from home. Pt dx with UTI on 09/04/19 and has been taking antibiotics. Pt states she has some relief of symptoms, per EMS. Pt states she received a call from E.D about results of culture and was told to come back in to be seen. Pt has no other complaints at this time.  Vitals: bp 142/82 Hr 88 spo2 98% on RA Temp 98.1 cbg 178

## 2019-09-08 NOTE — H&P (Signed)
Natalie Macdonald [id number] DOB: Jan 12, 1956 DOA: 09/08/2019     PCP: Baldo Ash, MD   Outpatient Specialists:     [facility] [name] arrived to ER on 09/08/19 at 2015  Patient coming from: home Live With family    Chief Complaint:   Chief Complaint  Patient presents with  . abnormal labs    HPI: Natalie Macdonald is a 64 y.o. female with medical history significant of sarcoidosis decubitus ulcers, diabetes mellitus with Charcot joint, fibromyalgia, GERD, hypertension, hypothyroidism, left hemiplegia,      Presented to emergency department because she was called and instructed to come.  EMS was called for transport.  She presents because she was called to be notified at one of her blood cultures were positive  Recently has been diagnosed with UTI on 4 March started on antibiotics Bactrim but continued to have subjective fevers one of her cultures grew gram-positive rods and she was instructed to come back to have further evaluation done. Earlier last week she also was endorsing some upper and lower extremity numbness and MRI was done that showed some enhancement unclear if acute or chronic neurosurgery [name] was consulted and plan was to follow-up with her surgeon [name] an outpatient  She has known sacral pressure ulcer followed by home health She has history of recurrent UTIs secondary to indwelling Foley catheter She has been having some persistent back pain and foul-smelling urine.  Urine culture grew multiple species Patient in the past has grew Bacteroides fragilis in the setting of sacral ulcer She had significant sepsis in 2019 secondary to decubitus ulcer States that her also has been doing better she would like to get more help at home with dressing.  States her husband is debilitated and has hard time helping her.   Infectious risk factors:  Reports none     in house  PCR testing  Pending  No results found for: SARSCOV2NAA   Regarding  pertinent Chronic problems:      HTN on amlodipine and benazepril, Coreg     DM 2 -  Lab Results  Component Value Date   HGBA1C 8.3 (H) 02/12/2018   on insulin,     Hypothyroidism:  Lab Results  Component Value Date   TSH 0.303 (L) 01/03/2018   on synthroid    Morbid obesity-   BMI Readings from Last 1 Encounters:  01/10/18 39.92 kg/m   Left-sided weakness had history of TIA remotely but also complications from spinal surgery    While in ER:  Repeat blood cultures obtained Discussed with pharmacy who so far recommended only Rocephin Chest x-ray was unremarkable   The following Work up has been ordered so far:  Orders Placed This Encounter  Procedures  . Critical Care  . Culture, blood (single) w Reflex to ID Panel  . SARS CORONAVIRUS 2 (TAT 6-24 HRS) Nasopharyngeal Nasopharyngeal Swab  . DG Chest Port 1 View  . CBC  . Comprehensive metabolic panel  . Urinalysis, Routine w reflex microscopic  . Lactic acid, plasma  . Cardiac monitoring  . Consult to hospitalist  ALL PATIENTS BEING ADMITTED/HAVING PROCEDURES NEED COVID-19 SCREENING  . EKG 12-Lead  . EKG 12-Lead  . Insert peripheral IV     Following Medications were ordered in ER: Medications  cefTRIAXone (ROCEPHIN) 2 g in sodium chloride 0.9 % 100 mL IVPB (2 g Intravenous New Bag/Given 09/08/19 2226)  acetaminophen (TYLENOL) tablet 1,000 mg (1,000 mg Oral Given 09/08/19 2224)  Consult Orders  (From admission, onward)         Start     Ordered   09/08/19 2228  Consult to hospitalist  ALL PATIENTS BEING ADMITTED/HAVING PROCEDURES NEED COVID-19 SCREENING  Once    Comments: ALL PATIENTS BEING ADMITTED/HAVING PROCEDURES NEED COVID-19 SCREENING  Provider:  (Not yet assigned)  Question Answer Comment  Place call to: [facility]   Reason for Consult Admit      09/08/19 2227          Significant initial  Findings: Abnormal Labs Reviewed  COMPREHENSIVE METABOLIC PANEL - Abnormal; Notable for  the following components:      Result Value   Glucose, Bld 122 (*)    All other components within normal limits  URINALYSIS, ROUTINE W REFLEX MICROSCOPIC - Abnormal; Notable for the following components:   APPearance HAZY (*)    Leukocytes,Ua LARGE (*)    RBC / HPF >50 (*)    WBC, UA >50 (*)    Bacteria, UA RARE (*)    All other components within normal limits     Otherwise labs showing:    Recent Labs  Lab 09/04/19 2338 09/08/19 2122  NA 136 136  K 4.3 3.9  CO2 26 25  GLUCOSE 319* 122*  BUN 23 12  CREATININE 1.09* 0.68  CALCIUM 8.9 9.6    Cr    stable,    Lab Results  Component Value Date   CREATININE 0.68 09/08/2019   CREATININE 1.09 (H) 09/04/2019   CREATININE 1.27 (H) 04/24/2019    Recent Labs  Lab 09/04/19 2338 09/08/19 2122  AST 23 20  ALT 22 23  ALKPHOS 93 88  BILITOT 0.5 0.4  PROT 7.4 8.1  ALBUMIN 3.4* 3.8   Lab Results  Component Value Date   CALCIUM 9.6 09/08/2019   PHOS 3.7 01/10/2018      WBC      Component Value Date/Time   WBC 9.9 09/08/2019 2122   ANC    Component Value Date/Time   NEUTROABS 8.3 (H) 09/04/2019 2338   ALC No components found for: LYMPHAB    Plt: Lab Results  Component Value Date   PLT 275 09/08/2019     Lactic Acid, Venous    Component Value Date/Time   LATICACIDVEN 1.7 09/08/2019 2122      COVID-19 Labs    No results found for: SARSCOV2NAA   HG/HCT  Stable     Component Value Date/Time   HGB 12.8 09/08/2019 2122   HCT 40.5 09/08/2019 2122      ECG: Ordered Personally reviewed by me showing: HR : 87  Rhythm:  NSR,     no evidence of ischemic changes QTC 490    UA yeast present   Urine analysis:    Component Value Date/Time   COLORURINE YELLOW 09/08/2019 2122   APPEARANCEUR HAZY (A) 09/08/2019 2122   LABSPEC 1.008 09/08/2019 2122   PHURINE 7.0 09/08/2019 2122   GLUCOSEU NEGATIVE 09/08/2019 2122   HGBUR NEGATIVE 09/08/2019 2122   BILIRUBINUR NEGATIVE 09/08/2019 2122   [place]  NEGATIVE 09/08/2019 2122   PROTEINUR NEGATIVE 09/08/2019 2122   NITRITE NEGATIVE 09/08/2019 2122   LEUKOCYTESUR LARGE (A) 09/08/2019 2122      Ordered   NON acute     ED Triage Vitals  Enc Vitals Group     BP 09/08/19 2036 (!) 160/87     Pulse Rate 09/08/19 2036 87     Resp 09/08/19 2036 13  Temp 09/08/19 2036 97.7 F (36.5 C)     Temp Source 09/08/19 2036 Oral     SpO2 09/08/19 2036 98 %     Weight --      Height --      Head Circumference --      Peak Flow --      Pain Score 09/08/19 2037 9     Pain Loc --      Pain Edu? --      Excl. in [place]? --   TMAX(24)@       Latest  Blood pressure (!) 160/87, pulse 87, temperature 97.7 F (36.5 C), temperature source Oral, resp. rate 13, SpO2 98 %.     Hospitalist was called for admission for bacteremia   Review of Systems:    Pertinent positives include: fatigue,  Constitutional:  No weight loss, night sweats, Fevers, chills,  weight loss  HEENT:  No headaches, Difficulty swallowing,Tooth/dental problems,Sore throat,  No sneezing, itching, ear ache, nasal congestion, post nasal drip,  Cardio-vascular:  No chest pain, Orthopnea, PND, anasarca, dizziness, palpitations.no Bilateral lower extremity swelling  GI:  No heartburn, indigestion, abdominal pain, nausea, vomiting, diarrhea, change in bowel habits, loss of appetite, melena, blood in stool, hematemesis Resp:  no shortness of breath at rest. No dyspnea on exertion, No excess mucus, no productive cough, No non-productive cough, No coughing up of blood.No change in color of mucus.No wheezing. Skin:  no rash or lesions. No jaundice GU:  no dysuria, change in color of urine, no urgency or frequency. No straining to urinate.  No flank pain.  Musculoskeletal:  No joint pain or no joint swelling. No decreased range of motion. No back pain.  Psych:  No change in mood or affect. No depression or anxiety. No memory loss.  Neuro: no localizing neurological complaints, no  tingling, no weakness, no double vision, no gait abnormality, no slurred speech, no confusion  All systems reviewed and apart from [place] all are negative  Past Medical History:   Past Medical History:  Diagnosis Date  . Allergic rhinitis   . Arthritis    knees, shoulder  . Bronchitis    history  . Cellulitis of left foot   . Cervical disc disorder   . 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 ([place])   . Chronic osteomyelitis ([place])    left foot  . Chronic respiratory infection   . Diabetes mellitus without complication ([place])   . Diabetes mellitus, type II ([place])   . Diabetic foot ulcer ([place])    left foot  . Fibromyalgia   . GERD (gastroesophageal reflux disease)   . Headache    sinus  . History of blood transfusion   . Hypertension   . Hyponatremia   . hypothyroid   . Hypothyroidism   . Joint pain    bilateral legs  . Left hemiplegia ([place])   . Sarcoidosis   . Sepsis ([place])    HX  . Sepsis ([place])   . Uterine fibroid    hx  . Uterine fibroid       Past Surgical History:  Procedure Laterality Date  . ANTERIOR CERVICAL DECOMP/DISCECTOMY FUSION N/A 10/17/2016   Procedure: ANTERIOR CERVICAL DECOMPRESSION/DISCECTOMY FUSION CERVICAL FIVE- CERVICAL SIX, CERVICAL SIX- CERVICAL SEVEN, POSSIBLE CERVICAL SIX CORPECTOMY;  Surgeon: Consuella Lose, MD;  Location: [place];  Service: Neurosurgery;  Laterality: N/A;  . ANTERIOR CERVICAL DECOMP/DISCECTOMY FUSION    . APPLICATION OF WOUND VAC Left    foot  .  CHOLECYSTECTOMY    . FOOT SURGERY Bilateral    implants  . IRRIGATION AND DEBRIDEMENT FOOT Left    X2  . KNEE ARTHROPLASTY    . THYROIDECTOMY    . UTERINE FIBROID SURGERY      Social History:  Ambulatory  wheelchair bound,      reports that she has never smoked. She has never used smokeless tobacco. She reports that she does not drink alcohol or use drugs.     Family History:   Family History  Problem Relation Age of Onset  . Throat cancer Mother      Allergies: Allergies  Allergen Reactions  . Grapefruit Bioflavonoid Complex Hives, Swelling and Other (See Comments)    Caused her to get hives, swelling of the eye, throat   . Latex Hives  . Procaine Anaphylaxis  . Procaine Anaphylaxis  . Grapefruit Flavor [Flavoring Agent]   . Novolog [Insulin Aspart]     Okay to use Regular insulin (patient doesn't tolerate Novolog)    . Penicillins Hives, Itching and Swelling    Tolerates Keflex, Augmentin (2021)     Prior to Admission medications   Medication Sig Start Date End Date Taking? Authorizing Provider  amitriptyline (ELAVIL) 100 MG tablet Take 100 mg by mouth at bedtime.    Yes [provider]  amLODipine-benazepril (LOTREL) 5-40 MG capsule Take 1 capsule by mouth Macdonald.   Yes [provider]  APIDRA 100 UNIT/ML injection Inject 18-25 Units into the skin 3 (three) times Macdonald with meals. Sliding Scale 12/13/17  Yes [provider]  baclofen (LIORESAL) 10 MG tablet Take 10 mg by mouth 3 (three) times Macdonald as needed for muscle spasms.  10/03/16  Yes [provider]  Calcium Carbonate (CALCIUM 600 PO) Take 600 mg by mouth Macdonald.   Yes [provider]  carvedilol (COREG) 25 MG tablet Take 25 mg by mouth 2 (two) times Macdonald with a meal.   Yes [provider]  cetirizine (ZYRTEC) 10 MG tablet Take 10 mg by mouth Macdonald as needed for allergies.    Yes [provider]  chlorthalidone (HYGROTON) 25 MG tablet Take 25 mg by mouth Macdonald.   Yes [provider]  CRANBERRY PO Take 2 capsules by mouth Macdonald.   Yes [provider]  diclofenac (VOLTAREN) 75 MG EC tablet Take 75 mg by mouth 2 (two) times Macdonald. 12/05/17  Yes [provider]  DULoxetine (CYMBALTA) 30 MG capsule Take 60 mg by mouth Macdonald. 11/25/17  Yes [provider]  fluticasone (FLONASE) 50 MCG/ACT nasal spray Place 2 sprays into both nostrils Macdonald as needed for allergies.    Yes [provider]  gabapentin (NEURONTIN) 300 MG capsule Take 300 mg by mouth 3 (three) times Macdonald as needed (for pain).    Yes [provider]  hydrOXYzine (ATARAX/VISTARIL) 10 MG tablet Take 10 mg by mouth every 6 (six) hours as needed for itching or anxiety.  08/29/19  Yes [provider]  levothyroxine (SYNTHROID, LEVOTHROID) 150 MCG tablet Take 150 mcg by mouth Macdonald before breakfast.   Yes [provider]  Multiple Vitamin (MULTIVITAMIN) tablet Take 1 tablet by mouth Macdonald.   Yes [provider]  Oxycodone HCl 10 MG TABS Take 20 mg by mouth every 4 (four) hours as needed (for pain).  10/04/16  Yes [provider]  Oxycodone HCl 20 MG TABS Take 1 tablet by mouth every 4 (four) hours. 09/01/19  Yes [provider]  pantoprazole (PROTONIX) 40 MG tablet Take 40 mg by mouth Macdonald.   Yes [provider]  pravastatin (PRAVACHOL) 20 MG tablet Take 20 mg by mouth Macdonald.   Yes [provider]  Probiotic Product (PROBIOTIC PO) Take 2 capsules by mouth Macdonald.   Yes [provider]  Pyridoxine HCl (VITAMIN B-6) 250 MG tablet Take 250 mg by mouth Macdonald.   Yes [provider]  ranitidine (ZANTAC) 75 MG tablet Take 150 mg by mouth Macdonald as needed for heartburn.   Yes [provider]  silver sulfADIAZINE (SILVADENE) 1 % cream Apply 1 application topically 2 (two) times Macdonald.  01/15/18  Yes [provider]  sulfamethoxazole-trimethoprim (BACTRIM DS) 800-160 MG tablet Take 1 tablet by mouth 2 (two) times Macdonald for 7 days. 09/05/19 09/12/19 Yes Bero, Barth Kirks, MD  TOUJEO SOLOSTAR 300 UNIT/ML SOPN Inject 40-50 Units into the skin at bedtime. Sliding Scale 10/30/17  Yes [provider]  vitamin B-12 (CYANOCOBALAMIN) 1000 MCG tablet Take 1,000 mcg by mouth Macdonald.   Yes [provider]  vitamin E 400 UNIT capsule Take 400 Units by mouth Macdonald.   Yes [provider]  cephALEXin (KEFLEX) 500 MG  capsule Take 1 capsule (500 mg total) by mouth 4 (four) times Macdonald. Patient not taking: Reported on 09/04/2019 04/24/19   Isla Pence, MD  cephALEXin [facility]) 500 MG capsule 2 caps po bid x 7 days Patient not taking: Reported on 09/08/2019 08/21/19   Palumbo, April, MD  fluconazole (DIFLUCAN) 150 MG tablet Take 1 tablet (150 mg total) by mouth Macdonald. Patient not taking: Reported on 09/08/2019 09/05/19   Maudie Flakes, MD   Physical Exam: Blood pressure (!) 160/87, pulse 87, temperature 97.7 F (36.5 C), temperature source Oral, resp. rate 13, SpO2 98 %. 1. General:  in No Acute distress    Chronically ill  -appearing 2. Psychological: Alert and   Oriented 3. Head/ENT:   Dry Mucous Membranes                          Head Non traumatic, neck supple                         Poor Dentition 4. SKIN:  decreased Skin turgor,  Skin clean Dry and intact no rash unstageable decubitus ulcer on the sacrum 5. Heart: Regular rate and rhythm no  Murmur, no Rub or gallop 6. Lungs:  no wheezes or crackles   7. Abdomen: Soft, non-tender, Non distended  obese  bowel sounds present - foley in place colostomy in place 8. Lower extremities: no clubbing, cyanosis, no  edema 9. Neurologically leftside weakness chronic 10. MSK: Normal range of motion   All other LABS:     Recent Labs  Lab 09/04/19 2338 09/08/19 2122  WBC 13.6* 9.9  NEUTROABS 8.3*  --   HGB 12.0 12.8  HCT 38.8 40.5  MCV 94.6 92.9  PLT 239 275     Recent Labs  Lab 09/04/19 2338 09/08/19 2122  NA 136 136  K 4.3 3.9  CL 102 102  CO2 26 25  GLUCOSE 319* 122*  BUN 23 12  CREATININE 1.09* 0.68  CALCIUM 8.9 9.6     Recent Labs  Lab 09/04/19 2338 09/08/19 2122  AST 23 20  ALT 22 23  ALKPHOS 93 88  BILITOT 0.5 0.4  PROT 7.4 8.1  ALBUMIN 3.4* 3.8  Cultures:    Component Value Date/Time   SDES  09/05/2019 0150    URINE, RANDOM Performed at [facility], [place] [address], [place], [place]  [address]    SPECREQUEST  09/05/2019 0150    NONE Performed at [facility], [place] [address], [place], [place] [address]    CULT MULTIPLE SPECIES PRESENT, SUGGEST RECOLLECTION (A) 09/05/2019 0150   REPTSTATUS 09/05/2019 FINAL 09/05/2019 0150     Radiological Exams on Admission: DG Chest Port 1 View  Result Date: 09/08/2019 CLINICAL DATA:  Bacteremia. EXAM: PORTABLE CHEST 1 VIEW COMPARISON:  April 24, 2019. FINDINGS: The heart size and mediastinal contours are within normal limits. Both lungs are clear. There is no pleural effusion, pneumothorax, pulmonary edema or pneumonia. There are mild to advanced skeletal degenerative changes most pronounced at the left glenohumeral joint, as well as remote C6-T1 ACDF. Telemetry leads are present. There is no free subdiaphragmatic air. IMPRESSION: No pneumonia or free subdiaphragmatic air. Chronic skeletal findings. Electronically Signed   By: Revonda Humphrey   On: 09/08/2019 22:14    Chart has been reviewed    Assessment/Plan  64 y.o. female with medical history significant of sarcoidosis decubitus ulcers, diabetes mellitus with Charcot joint, fibromyalgia, GERD, hypertension, hypothyroidism, left hemiplegia,    Admitted for bacteremia  Present on Admission:  . Bacteremia - await repeat blood cultures likely contaminant - for now on rocephin May need ID consult given complex past hx of infections  . Left hemiplegia (HCC) - chronic  . Hyperlipidemia, unspecified - chronic stable  . Hypertension - chronic cont home meds  . Sarcoidosis  - chronic stable   . Yeast cystitis -   flucanazole 150mg  X1 May need longer treatment as this is her second course of fluconazole she states that the yeast has improved but has not resolved completely We will discuss with ID in a.m.  abnormal MRI - will follow up with neurosurgery as an outpatient Dm 2 - - Order Sensitive  SSI   -  switch to   Lantus  40 units,  -  check TSH and HgA1C  -  Hold by mouth medications   Decubitus ulcer wound care consult ordered  Other plan as per orders.  DVT prophylaxis:     Lovenox     Code Status:  FULL CODE   as per patient   I had personally discussed CODE STATUS with patient   Family Communication:   Family not at  Bedside   Disposition Plan:    To home once workup is complete and patient is stable                      Would benefit from PT/OT eval prior to DC  Ordered                Wound care consult                   Transitional care ordered Consults called:  may benefit from ID consult in AM  Admission status:  ED Disposition    None      Obs      Level of care     medical floor     Precautions: admitted as asymptomatic screening protocol   PPE: Used by the provider:   P100  eye Goggles,  Gloves    Natalie Macdonald 09/09/2019, 12:14 AM    [facility]     after 2 AM  please page floor coverage PA If 7AM-7PM, please contact the day team taking care of the patient using [email or web address]   Patient was evaluated in the context of the global COVID-19 pandemic, which necessitated consideration that the patient might be at risk for infection with the SARS-CoV-2 virus that causes COVID-19. Institutional protocols and algorithms that pertain to the evaluation of patients at risk for COVID-19 are in a state of rapid change based on information released by regulatory bodies including the CDC and federal and state organizations. These policies and algorithms were followed during the patient's care.

## 2019-09-08 NOTE — ED Provider Notes (Signed)
[facility] Emergency Department Provider Note MRN:  [id number]  Arrival date & time: 09/08/19     Chief Complaint   abnormal labs   History of Present Illness   Natalie Macdonald is a 64 y.o. year-old female with a history of chronic osteomyelitis, diabetes, fibromyalgia presenting to the ED with chief complaint of abnormal lab.  Sent to the emergency department due to abnormal labs.  Patient endorsing continued chills and symptoms of UTI, dysuria.  Subjective fever intermittently.  Denies headache or vision change, no neck pain, no chest pain or shortness of breath, no cough, no abdominal pain, no vomiting, no diarrhea.  Review of Systems  A complete 10 system review of systems was obtained and all systems are negative except as noted in the HPI and PMH.   Patient's Health History    Past Medical History:  Diagnosis Date  . Allergic rhinitis   . Arthritis    knees, shoulder  . Bronchitis    history  . Cellulitis of left foot   . Cervical disc disorder   . 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 Chronic osteomyelitis ([place])    left foot  . Chronic respiratory infection   . Diabetes mellitus without complication ([place])   . Diabetes mellitus, type II ([place])   . Diabetic foot ulcer ([place])    left foot  . Fibromyalgia   . GERD (gastroesophageal reflux disease)   . Headache    sinus  . History of blood transfusion   . Hypertension   . Hyponatremia   . hypothyroid   . Hypothyroidism   . Joint pain    bilateral legs  . Left hemiplegia ([place])   . Sarcoidosis   . Sepsis ([place])    HX  . Sepsis ([place])   . Uterine fibroid    hx  . Uterine fibroid     Past Surgical History:  Procedure Laterality Date  . ANTERIOR CERVICAL DECOMP/DISCECTOMY FUSION N/A 10/17/2016   Procedure: ANTERIOR CERVICAL DECOMPRESSION/DISCECTOMY FUSION CERVICAL FIVE- CERVICAL SIX, CERVICAL SIX- CERVICAL SEVEN, POSSIBLE CERVICAL SIX CORPECTOMY;  Surgeon: Consuella Lose, MD;  Location: [place];  Service: Neurosurgery;  Laterality: N/A;  . ANTERIOR CERVICAL DECOMP/DISCECTOMY FUSION    . APPLICATION OF WOUND VAC Left    foot  . CHOLECYSTECTOMY    . FOOT SURGERY Bilateral    implants  . IRRIGATION AND DEBRIDEMENT FOOT Left    X2  . KNEE ARTHROPLASTY    . THYROIDECTOMY    . UTERINE FIBROID SURGERY      Family History  Problem Relation Age of Onset  . Throat cancer Mother     Social History   Socioeconomic History  . Marital status: Married    Spouse name: Not on file  . Number of children: Not on file  . Years of education: Not on file  . Highest education level: Not on file  Occupational History  . Not on file  Tobacco Use  . Smoking status: Never Smoker  . Smokeless tobacco: Never Used  Substance and Sexual Activity  . Alcohol use: No  . Drug use: No  . Sexual activity: Not on file  Other Topics Concern  . Not on file  Social History Narrative   ** Merged History Encounter **       Social Determinants of Health   Financial Resource Strain:   . Difficulty of Paying Living Expenses: Not on file  Food Insecurity:   .  Worried About Charity fundraiser in the Last Year: Not on file  . Ran Out of Food in the Last Year: Not on file  Transportation Needs:   . Lack of Transportation (Medical): Not on file  . Lack of Transportation (Non-Medical): Not on file  Physical Activity:   . Days of Exercise per Week: Not on file  . Minutes of Exercise per Session: Not on file  Stress:   . Feeling of Stress : Not on file  Social Connections:   . Frequency of Communication with Friends and Family: Not on file  . Frequency of Social Gatherings with Friends and Family: Not on file  . Attends Religious Services: Not on file  . Active Member of Clubs or Organizations: Not on file  . Attends Archivist Meetings: Not on file  . Marital Status: Not on file  Intimate Partner Violence:   . Fear of Current or Ex-Partner: Not on file    . Emotionally Abused: Not on file  . Physically Abused: Not on file  . Sexually Abused: Not on file     Physical Exam   Vitals:   09/08/19 2036 09/08/19 2251  BP: (!) 160/87 (!) 135/98  Pulse: 87 81  Resp: 13 14  Temp: 97.7 F (36.5 C)   SpO2: 98% 98%    CONSTITUTIONAL: Well-appearing, NAD NEURO:  Alert and oriented x 3, no focal deficits EYES:  eyes equal and reactive ENT/NECK:  no LAD, no JVD CARDIO: Regular rate, well-perfused, normal S1 and S2 PULM:  CTAB no wheezing or rhonchi GI/GU:  normal bowel sounds, non-distended, non-tender MSK/SPINE:  No gross deformities, no edema SKIN:  no rash, atraumatic PSYCH:  Appropriate speech and behavior  *Additional and/or pertinent findings included in MDM below  Diagnostic and Interventional Summary    EKG Interpretation  Date/Time:  Monday September 08 2019 21:56:48 EST Ventricular Rate:  87 PR Interval:  158 QRS Duration: 114 QT Interval:  408 QTC Calculation: 490 R Axis:   56 Text Interpretation: Normal sinus rhythm Anterior infarct , age undetermined Abnormal ECG Confirmed by Gerlene Fee [phone]) on 09/08/2019 10:27:13 PM      Cardiac Monitoring Interpretation:  Labs Reviewed  COMPREHENSIVE METABOLIC PANEL - Abnormal; Notable for the following components:      Result Value   Glucose, Bld 122 (*)    All other components within normal limits  URINALYSIS, ROUTINE W REFLEX MICROSCOPIC - Abnormal; Notable for the following components:   APPearance HAZY (*)    Leukocytes,Ua LARGE (*)    RBC / HPF >50 (*)    WBC, UA >50 (*)    Bacteria, UA RARE (*)    All other components within normal limits  CULTURE, BLOOD (SINGLE)  SARS CORONAVIRUS 2 (TAT 6-24 HRS)  CBC  LACTIC ACID, PLASMA    DG Chest Port 1 View  Final Result      Medications  cefTRIAXone (ROCEPHIN) 2 g in sodium chloride 0.9 % 100 mL IVPB (2 g Intravenous New Bag/Given 09/08/19 2226)  acetaminophen (TYLENOL) tablet 1,000 mg (1,000 mg Oral Given 09/08/19 2224)      Procedures  /  Critical Care .Critical Care Performed by: Maudie Flakes, MD Authorized by: Maudie Flakes, MD   Critical care provider statement:    Critical care time (minutes):  32   Critical care was necessary to treat or prevent imminent or life-threatening deterioration of the following conditions: bacteremia.   Critical care was time spent personally by  me on the following activities:  Discussions with consultants, evaluation of patient's response to treatment, examination of patient, ordering and performing treatments and interventions, ordering and review of laboratory studies, ordering and review of radiographic studies, pulse oximetry, re-evaluation of patient's condition, obtaining history from patient or surrogate and review of old charts    ED Course and Medical Decision Making  I have reviewed the triage vital signs, the nursing notes, and pertinent available records from the EMR.  Pertinent labs & imaging results that were available during my care of the patient were reviewed by me and considered in my medical decision making (see below for details).     GPCs in blood from 3-5, persistent UTI recently, continued chills.  Urine culture with multiple species.  Will admit to hospitalist service.  Currently well-appearing and hemodynamically stable.  Providing vancomycin.  10:56 PM update: Discussed antibiotic regimen with pharmacy, who recommends ceftriaxone, orders switched.  Admitted to hospitalist service for further care.    Barth Kirks. Sedonia Small, MD [place] [email or web address]  Final Clinical Impressions(s) / ED Diagnoses     ICD-10-CM   1. Acute cystitis without hematuria  N30.00   2. Bacteremia  R78.81     ED Discharge Orders    None       Discharge Instructions Discussed with and Provided to Patient:   Discharge Instructions   None       Maudie Flakes, MD 09/08/19 2256

## 2019-09-09 ENCOUNTER — Encounter (HOSPITAL_COMMUNITY): Payer: Self-pay | Admitting: Internal Medicine

## 2019-09-09 DIAGNOSIS — B3741 Candidal cystitis and urethritis: Secondary | ICD-10-CM | POA: Diagnosis present

## 2019-09-09 DIAGNOSIS — E118 Type 2 diabetes mellitus with unspecified complications: Secondary | ICD-10-CM | POA: Diagnosis not present

## 2019-09-09 DIAGNOSIS — R7881 Bacteremia: Secondary | ICD-10-CM | POA: Diagnosis present

## 2019-09-09 LAB — CBC
HCT: 38.5 % (ref 36.0–46.0)
Hemoglobin: 12.2 g/dL (ref 12.0–15.0)
MCH: 29.2 pg (ref 26.0–34.0)
MCHC: 31.7 g/dL (ref 30.0–36.0)
MCV: 92.1 fL (ref 80.0–100.0)
Platelets: 237 10*3/uL (ref 150–400)
RBC: 4.18 MIL/uL (ref 3.87–5.11)
RDW: 13.9 % (ref 11.5–15.5)
WBC: 9 10*3/uL (ref 4.0–10.5)
nRBC: 0 % (ref 0.0–0.2)

## 2019-09-09 LAB — HEMOGLOBIN A1C
Hgb A1c MFr Bld: 11.1 % — ABNORMAL HIGH (ref 4.8–5.6)
Mean Plasma Glucose: 271.87 mg/dL

## 2019-09-09 LAB — TSH: TSH: 35.335 u[IU]/mL — ABNORMAL HIGH (ref 0.350–4.500)

## 2019-09-09 LAB — COMPREHENSIVE METABOLIC PANEL
ALT: 24 U/L (ref 0–44)
AST: 23 U/L (ref 15–41)
Albumin: 3.5 g/dL (ref 3.5–5.0)
Alkaline Phosphatase: 79 U/L (ref 38–126)
Anion gap: 10 (ref 5–15)
BUN: 12 mg/dL (ref 8–23)
CO2: 22 mmol/L (ref 22–32)
Calcium: 9.1 mg/dL (ref 8.9–10.3)
Chloride: 101 mmol/L (ref 98–111)
Creatinine, Ser: 0.69 mg/dL (ref 0.44–1.00)
GFR calc Af Amer: >60 mL/min (ref >60)
GFR calc non Af Amer: >60 mL/min (ref >60)
Glucose, Bld: 255 mg/dL — ABNORMAL HIGH (ref 70–99)
Potassium: 4.3 mmol/L (ref 3.5–5.1)
Sodium: 133 mmol/L — ABNORMAL LOW (ref 135–145)
Total Bilirubin: 0.5 mg/dL (ref 0.3–1.2)
Total Protein: 7.5 g/dL (ref 6.5–8.1)

## 2019-09-09 LAB — CULTURE, BLOOD (ROUTINE X 2): Special Requests: ADEQUATE

## 2019-09-09 LAB — GLUCOSE, CAPILLARY
Glucose-Capillary: 242 mg/dL — ABNORMAL HIGH (ref 70–99)
Glucose-Capillary: 232 mg/dL — ABNORMAL HIGH (ref 70–99)
Glucose-Capillary: 340 mg/dL — ABNORMAL HIGH (ref 70–99)
Glucose-Capillary: 323 mg/dL — ABNORMAL HIGH (ref 70–99)

## 2019-09-09 LAB — PHOSPHORUS: Phosphorus: 3.4 mg/dL (ref 2.5–4.6)

## 2019-09-09 LAB — SARS CORONAVIRUS 2 (TAT 6-24 HRS): SARS Coronavirus 2: NEGATIVE

## 2019-09-09 LAB — MAGNESIUM: Magnesium: 1.9 mg/dL (ref 1.7–2.4)

## 2019-09-09 MED ORDER — INSULIN GLARGINE 100 UNIT/ML ~~LOC~~ SOLN
40.0000 [IU] | Freq: Every day | SUBCUTANEOUS | Status: DC
  Filled 2019-09-09: qty 0.4

## 2019-09-09 MED ORDER — SODIUM CHLORIDE 0.9% FLUSH: 3.0000 mL | INTRAVENOUS | Status: DC | PRN

## 2019-09-09 MED ORDER — LORATADINE 10 MG PO TABS
10.0000 mg | ORAL_TABLET | Freq: Every day | ORAL | Status: DC
  Administered 2019-09-09: 10:00:00 10 mg via ORAL
  Filled 2019-09-09: qty 1

## 2019-09-09 MED ORDER — CHLORTHALIDONE 25 MG PO TABS
25.0000 mg | ORAL_TABLET | Freq: Every day | ORAL | Status: DC
  Administered 2019-09-09: 25 mg via ORAL
  Filled 2019-09-09: qty 1

## 2019-09-09 MED ORDER — CARVEDILOL 25 MG PO TABS
25.0000 mg | ORAL_TABLET | Freq: Two times a day (BID) | ORAL | Status: DC
  Administered 2019-09-09 (×2): 25 mg via ORAL
  Filled 2019-09-09 (×2): qty 1

## 2019-09-09 MED ORDER — HYDROCODONE-ACETAMINOPHEN 5-325 MG PO TABS
1.0000 | ORAL_TABLET | ORAL | Status: DC | PRN
  Administered 2019-09-09: 08:00:00 2 via ORAL
  Filled 2019-09-09: qty 2

## 2019-09-09 MED ORDER — BENAZEPRIL HCL 10 MG PO TABS
40.0000 mg | ORAL_TABLET | Freq: Every day | ORAL | Status: DC
  Administered 2019-09-09: 40 mg via ORAL
  Filled 2019-09-09: qty 4

## 2019-09-09 MED ORDER — PRAVASTATIN SODIUM 20 MG PO TABS
20.0000 mg | ORAL_TABLET | Freq: Every day | ORAL | Status: DC
  Administered 2019-09-09: 20 mg via ORAL
  Filled 2019-09-09: qty 1

## 2019-09-09 MED ORDER — LEVOTHYROXINE SODIUM 112 MCG PO TABS: 168.0000 ug | ORAL_TABLET | Freq: Every day | ORAL | 0 refills | Status: DC

## 2019-09-09 MED ORDER — PANTOPRAZOLE SODIUM 40 MG PO TBEC
40.0000 mg | DELAYED_RELEASE_TABLET | Freq: Every day | ORAL | Status: DC
  Administered 2019-09-09: 40 mg via ORAL
  Filled 2019-09-09: qty 1

## 2019-09-09 MED ORDER — GABAPENTIN 300 MG PO CAPS: 300.0000 mg | ORAL_CAPSULE | Freq: Three times a day (TID) | ORAL | Status: DC | PRN

## 2019-09-09 MED ORDER — SODIUM CHLORIDE 0.9% FLUSH
3.0000 mL | Freq: Two times a day (BID) | INTRAVENOUS | Status: DC
  Administered 2019-09-09: 3 mL via INTRAVENOUS

## 2019-09-09 MED ORDER — BACLOFEN 10 MG PO TABS: 10.0000 mg | ORAL_TABLET | Freq: Three times a day (TID) | ORAL | Status: DC | PRN

## 2019-09-09 MED ORDER — FLUCONAZOLE 150 MG PO TABS
150.0000 mg | ORAL_TABLET | Freq: Once | ORAL | Status: AC
  Administered 2019-09-09: 01:00:00 150 mg via ORAL
  Filled 2019-09-09: qty 1

## 2019-09-09 MED ORDER — ONDANSETRON HCL 4 MG PO TABS
4.0000 mg | ORAL_TABLET | Freq: Four times a day (QID) | ORAL | Status: DC | PRN
  Administered 2019-09-09: 4 mg via ORAL
  Filled 2019-09-09: qty 1

## 2019-09-09 MED ORDER — LEVOTHYROXINE SODIUM 75 MCG PO TABS
150.0000 ug | ORAL_TABLET | Freq: Every day | ORAL | Status: DC
  Administered 2019-09-09: 150 ug via ORAL
  Filled 2019-09-09: qty 2

## 2019-09-09 MED ORDER — ONDANSETRON HCL 4 MG/2ML IJ SOLN: 4.0000 mg | Freq: Four times a day (QID) | INTRAMUSCULAR | Status: DC | PRN

## 2019-09-09 MED ORDER — OXYCODONE HCL 5 MG PO TABS
20.0000 mg | ORAL_TABLET | ORAL | Status: DC | PRN
  Administered 2019-09-09 (×3): 20 mg via ORAL
  Filled 2019-09-09 (×3): qty 4

## 2019-09-09 MED ORDER — ACETAMINOPHEN 650 MG RE SUPP: 650.0000 mg | Freq: Four times a day (QID) | RECTAL | Status: DC | PRN

## 2019-09-09 MED ORDER — CHLORHEXIDINE GLUCONATE CLOTH 2 % EX PADS
6.0000 | MEDICATED_PAD | Freq: Every day | CUTANEOUS | Status: DC
  Administered 2019-09-09: 10:00:00 6 via TOPICAL

## 2019-09-09 MED ORDER — HYDROXYZINE HCL 10 MG PO TABS
10.0000 mg | ORAL_TABLET | Freq: Four times a day (QID) | ORAL | Status: DC | PRN
  Filled 2019-09-09: qty 1

## 2019-09-09 MED ORDER — AMLODIPINE BESYLATE 5 MG PO TABS
5.0000 mg | ORAL_TABLET | Freq: Every day | ORAL | Status: DC
  Administered 2019-09-09: 5 mg via ORAL
  Filled 2019-09-09: qty 1

## 2019-09-09 MED ORDER — SODIUM CHLORIDE 0.9 % IV SOLN
250.0000 mL | INTRAVENOUS | Status: DC | PRN
  Administered 2019-09-09: 03:00:00 250 mL via INTRAVENOUS

## 2019-09-09 MED ORDER — ACETAMINOPHEN 325 MG PO TABS
650.0000 mg | ORAL_TABLET | Freq: Four times a day (QID) | ORAL | Status: DC | PRN
  Administered 2019-09-09: 650 mg via ORAL
  Filled 2019-09-09: qty 2

## 2019-09-09 MED ORDER — AMITRIPTYLINE HCL 50 MG PO TABS
100.0000 mg | ORAL_TABLET | Freq: Every day | ORAL | Status: DC
  Filled 2019-09-09: qty 2

## 2019-09-09 MED ORDER — ENOXAPARIN SODIUM 40 MG/0.4ML ~~LOC~~ SOLN
40.0000 mg | SUBCUTANEOUS | Status: DC
  Administered 2019-09-09: 40 mg via SUBCUTANEOUS
  Filled 2019-09-09: qty 0.4

## 2019-09-09 MED ORDER — SODIUM CHLORIDE 0.9 % IV SOLN
2.0000 g | INTRAVENOUS | Status: DC
  Filled 2019-09-09: qty 20

## 2019-09-09 NOTE — TOC Initial Note (Addendum)
Transition of Care [facility]) - Initial/Assessment Note    Patient Details  Name: Natalie Macdonald MRN: [id number] Date of Birth: June 30, 1956  Transition of Care [facility]) CM/SW Contact:    Lia Hopping, [place] Phone Number: 09/09/2019, 2:38 PM  Clinical Narrative:                 CSW met with the patient at bedside to discuss home health. Patient reports she had home health until about 2 weeks ago when she stopped RN wound care services. She reports, "they came out to help but did not know what they were doing." She reports on several occasions the agency did not know how to properly remove her foley and her daughter had to intervene.  CSW discussed additional home health options. Patient agreeable to an agency in network with her insurance.  CSW reached out to the agencies in the patient service area.   [place] will provide RN services. Patient agreeable. Information written on AVS.   Patient reports her daughter will continue to assist with her wound care and colostomy care.   Expected Discharge Plan: [place] Barriers to Discharge: Barriers Resolved   Patient Goals and CMS Choice   CMS Medicare.gov Compare Post Acute Care list provided to:: Patient Choice offered to / list presented to : Patient  Expected Discharge Plan and Services Expected Discharge Plan: [place] In-house Referral: Clinical Social Work Discharge Planning Services: CM Consult Post Acute Care Choice: Home Health   Expected Discharge Date: 09/09/19                         HH Arranged: RN [place] Agency: [place] Date [facility] Agency Contacted: 09/09/19 Time HH Agency Contacted: [id number] Representative spoke with at [place]: [place] Arrangements/Services   Lives with:: Adult Children, Spouse Patient language and need for interpreter reviewed:: No Do you feel safe going back to the place where you live?: Yes      Need for Family  Participation in Patient Care: Yes (Comment) Care giver support system in place?: Yes (comment) Current home services: DME Criminal Activity/Legal Involvement Pertinent to Current Situation/Hospitalization: No - Comment as needed  Activities of Daily Living Home Assistive Devices/Equipment: Eyeglasses, CBG Meter ADL Screening (condition at time of admission) Patient's cognitive ability adequate to safely complete daily activities?: No Is the patient deaf or have difficulty hearing?: No Does the patient have difficulty seeing, even when wearing glasses/contacts?: No Does the patient have difficulty concentrating, remembering, or making decisions?: No Patient able to express need for assistance with ADLs?: Yes Does the patient have difficulty dressing or bathing?: Yes Independently performs ADLs?: No Communication: Independent Does the patient have difficulty walking or climbing stairs?: Yes Weakness of Legs: Left Weakness of Arms/Hands: Left  Permission Sought/Granted Permission sought to share information with : Case Manager, Other (comment) Permission granted to share information with : Yes, Verbal Permission Granted     Permission granted to share info w AGENCY: Home Health        Emotional Assessment Appearance:: Appears stated age Attitude/Demeanor/Rapport: Engaged Affect (typically observed): Accepting, Pleasant Orientation: : Oriented to Self, Oriented to Place, Oriented to  Time, Oriented to Situation Alcohol / Substance Use: Not Applicable Psych Involvement: No (comment)  Admission diagnosis:  Bacteremia [R78.81] Acute cystitis without hematuria [N30.00] Patient Active Problem List   Diagnosis Date Noted  . Bacteremia 09/09/2019  . Yeast cystitis  09/09/2019  . Diarrhea 01/06/2018  . Sepsis ([place]) 01/03/2018  . Cellulitis 01/03/2018  . Sacral decubitus ulcer, stage III ([place]) 01/03/2018  . AKI (acute kidney injury) ([place]) 01/03/2018  . Dehydration 01/03/2018  .  Toxic encephalopathy 01/03/2018  . Adverse drug effect 01/03/2018  . Acute encephalopathy   . Hypoxemia   . Palliative care encounter   . Septic shock ([place])   . Acute respiratory failure with hypoxemia ([place])   . Pressure injury of skin 10/18/2016  . Cervical spondylosis with myelopathy 10/17/2016  . Chronic left-sided low back pain with left-sided sciatica 09/05/2016  . Cervicalgia 09/05/2016  . Diabetes mellitus type 2 with complications ([place]) 54/24/8144  . Hyperlipidemia, unspecified 09/05/2016  . Hypertension 09/05/2016  . Sarcoidosis 09/05/2016  . Cervical disc disorder with myelopathy of mid-cervical region 03/13/2016  . Left hemiplegia ([place]) 02/07/2016  . Chronic osteomyelitis of ankle and foot, left ([place]) 09/25/2014  . Hyponatremia 09/25/2014  . Diabetic ulcer of left foot associated with type 2 diabetes mellitus ([place]) 08/30/2014  . Charcot's joint of foot due to diabetes ([place]) 10/15/2013  . Pain in joint involving ankle and foot 10/15/2013   PCP:  Baldo Ash, MD Pharmacy:   [id number] [id number]: [phone]: [phone]    Social Determinants of Health (SDOH) Interventions    Readmission Risk Interventions No flowsheet data found.

## 2019-09-09 NOTE — Progress Notes (Addendum)
Instructions were reviewed with patient. All questions were answered. Patient was transported home by [facility].

## 2019-09-09 NOTE — Progress Notes (Signed)
Inpatient Diabetes Program Recommendations  AACE/ADA: New Consensus Statement on Inpatient Glycemic Control (2015)  Target Ranges:  Prepandial:   less than 140 mg/dL      Peak postprandial:   less than 180 mg/dL (1-2 hours)      Critically ill patients:  140 - 180 mg/dL   Lab Results  Component Value Date   GLUCAP 232 (H) 09/09/2019   HGBA1C 11.1 (H) 09/09/2019    Review of Glycemic Control Results for SU, MELLAS (MRN [id number]) as of 09/09/2019 10:32  Ref. Range 09/08/2019 23:31 09/09/2019 04:15 09/09/2019 07:58  Glucose-Capillary Latest Ref Range: 70 - 99 mg/dL 172 (H) 242 (H) 232 (H)   Diabetes history: DM 2- NOTE ALLERGY TO NOVOLOG Outpatient Diabetes medications:  Toujeo 40-50 units q HS, Apidra 18-25 units tid with meals Current orders for Inpatient glycemic control:  Lantus 40 units q HS, Regular insulin 0-15 units q 4 hours  Inpatient Diabetes Program Recommendations:   Note A1C has worsened since 2019.  She is on insulin and home and will need to f/u with PCP.  Consider changing Regular insulin to tid with meals and HS.  Also consider adding meal coverage Regular 5 units tid with meals (hold if patient NPO or if patient not eating >50%).   Thanks, Adah Perl, RN, BC-ADM Inpatient Diabetes Coordinator Pager [phone] (8a-5p)

## 2019-09-09 NOTE — TOC Transition Note (Signed)
Transition of Care [facility]) - CM/SW Discharge Note   Patient Details  Name: Latigra Mikrut MRN: [id number] Date of Birth: [birth year]  Transition of Care [facility]) CM/SW Contact:  Lia Hopping, [place] Phone Number: 09/09/2019, 3:17 PM   Clinical Narrative:    PTAR to transport Home.  Med. Nes. Complete.    Final next level of care: Home w Home Health Services Barriers to Discharge: Barriers Resolved   Patient Goals and CMS Choice   CMS Medicare.gov Compare Post Acute Care list provided to:: Patient Choice offered to / list presented to : Patient  Discharge Placement                       Discharge Plan and Services In-house Referral: Clinical Social Work Discharge Planning Services: CM Consult Post Acute Care Choice: Home Health                    HH Arranged: RN [place]: [place] Date [place]: 09/09/19 Time [place]: [id number] Representative spoke with at [place]: [place] Determinants of Health ([place]) Interventions     Readmission Risk Interventions No flowsheet data found.

## 2019-09-09 NOTE — Evaluation (Signed)
Physical Therapy Evaluation Patient Details Name: Natalie Macdonald MRN: [id number] DOB: 06-Jun-1956 Today's Date: 09/09/2019   History of Present Illness  64 year old female admitted with decub ulcers; PMH includes DM with charcot joint, fibromyalgia, GERD, HTN, Hypothyroidism, left hemiplegia, UTIs- indwelling catheter and has colostomy.  Clinical Impression  Patient lying on left side when PT arrived. She relates that she resides in 2 story home with spouse, but that she is debilatated and very limited in how much he can help her- daughter helps, does all driving, but works outside the home. She stays downstairs, although they have lift chair on stairwell. She has RW, w/c air mattress. She indicates that a physician and an RN come to her home. She is essentially bedbound and unable to sit on edge of bed or get OOB to BS chair or W/C- due to wounds and related pain including fibromyalgia. She requires help with all ADLs. She indicates that she can only lie on her left side- can roll to the right briefly, but cannot stay positioned on right side. She has had HH in the past, but indicates that when therapy progressed her into sitting, rolling to sit, transfers- the wound opened up-- and she stays in bed, nonambulatory now for several months. She has HOYER LIFT- but has been unable to use this recently needing to avoid pressure on buttocks. At this time she is able to perform HEP with RLE and UE, and was familiar with format due to [facility]. She is not able to participate in PT for any attempts at mobilization. Being followed by Wound care team and nursing for optimal positioning and protection/healing of wounds.    Follow Up Recommendations SNF;Supervision/Assistance - 24 hour(Pending wound care needs. Spouse is disabled and limited in how he can help. Daughter helps, but also works outside the home)    Optician, dispensing  (She has all necessary equipment at home)    Recommendations for Other Services        Precautions / Restrictions Precautions Precaution Comments: Wound care team following for all wound care. She is not able to mobilize except briefly on right side, and majority of time she is on left side. Cannot sit upright in BS chair or w/c secondary to decub Restrictions Weight Bearing Restrictions: No Other Position/Activity Restrictions: She is essentially bed bound. Due to decubs, she has not been ambulatory for several months, although she relates she had been able to walk with [facility] prior to the wounds.      Mobility  Bed Mobility Overal bed mobility: Needs Assistance Bed Mobility: Rolling Rolling: Total assist;Max assist;+2 for safety/equipment         General bed mobility comments: she relates that she cannot "stand to be sitting on edge of bed, BS chair or in w/c due to wound pain as well as fibromyalgia"(Has HOYER LIFT at home, but has not been out of bed recently.)  Transfers                    Ambulation/Gait                Stairs            Wheelchair Mobility    Modified Rankin (Stroke Patients Only)       Balance  Pertinent Vitals/Pain Pain Assessment: 0-10 Pain Score: 6  Pain Descriptors / Indicators: Constant;Nagging;Pressure;Aching;Other (Comment)(from fibromyalgia and wound pain)    Home Living Family/patient expects to be discharged to:: Private residence Living Arrangements: Spouse/significant other;Children Available Help at Discharge: Family Type of Home: House Home Access: Ramped entrance     Story: Two [place]: Wheelchair - [employer] - 2 wheels;Walker - 4 wheels Additional Comments: Civil [employer] fast streamer, and Chair lift- but she stays downstairs.    Prior Function Level of Independence: Needs assistance   Gait / Transfers Assistance Needed: She relates that she has not been able to sit in wheelchair recently due to her wounds, the  fibromyalgia pain and the wound pain  ADL's / Homemaking Assistance Needed: Is essentially total care. Relates that an Therapist, sports and a physician come to her home. Her daughter does all housework, cooking, but does work outside the home. Daughter does all driving for groceries.  Comments: Left sided hemiplegia     Hand Dominance   Dominant Hand: Right    Extremity/Trunk Assessment        Lower Extremity Assessment Lower Extremity Assessment: Generalized weakness    Cervical / Trunk Assessment Cervical / Trunk Assessment: (could not sit up to assess posture, cannot be in this position due to wound pain/pressure)  Communication   Communication: No difficulties  Cognition Arousal/Alertness: Awake/alert Behavior During Therapy: WFL for tasks assessed/performed                                   General Comments: She is bedbound, and essentially total care due to pain from fibromyalgia and wounds- stays in bed most recently- only able to lie on LEFT side (can roll to right side, but pain prevents her from staying in that position)      General Comments General comments (skin integrity, edema, etc.): Needs close monitoring via nursing and wound care team for skin and joint integrity. She is bedbound and unable to sit on edge of bed, cannot transfer OOB to [facility] chair or w/c (has hoyer lift at home, but has not used recently)    Exercises General Exercises - Lower Extremity Ankle Circles/Pumps: Right;AROM;Supine Quad Sets: AROM;Right;Supine Gluteal Sets: AROM;Supine(gentle to avoid pressure/pain with wound.) Short Arc Quad: AROM;Supine;Right Hip ABduction/ADduction: AROM;Right;Supine Heel Raises: AROM;Supine;Right Other Exercises Other Exercises: Instructed in HEP and she indicates that [facility] gave her the same ex. Printed sheet issued. She relates that she cannot sit on edge of bed, or in BS chair or w/c and that [facility] had her do this and the "wound opened up"   Assessment/Plan     PT Assessment [place] does not need any further PT services(She is being followed by Wound care team as she is not able to participate in PT other than doing HEP independently- cannot tolerate any OOB sitting)  PT Problem List         PT Treatment Interventions      PT Goals (Current goals can be found in the Care Plan section)  Acute Rehab PT Goals Patient Stated Goal: Just would love for the wounds to heal so I can walk again PT Goal Formulation: All assessment and education complete, DC therapy(currently WOUND CARE team and nursing is optimal approach. She has HEP and is able to perform these independently with RLE and UEs) Time For Goal Achievement: 09/09/19 Potential to Achieve Goals: Good    Frequency  Barriers to discharge        Co-evaluation               AM-PAC PT "6 Clicks" Mobility  Outcome Measure Help needed turning from your back to your side while in a flat bed without using bedrails?: Total Help needed moving from lying on your back to sitting on the side of a flat bed without using bedrails?: Total(cannot tolerate) Help needed moving to and from a bed to a chair (including a wheelchair)?: Total(cannot tolerate) Help needed standing up from a chair using your arms (e.g., wheelchair or bedside chair)?: Total(has HOYER LIFT at home but has not been able to use recently) Help needed to walk in hospital room?: (nonambulatory) Help needed climbing 3-5 steps with a railing? : (nonambulatory) 6 Click Score: 4    End of Session   Activity Tolerance: Patient limited by pain(she is bedbound and unable to participate in PT- can do HEP independently but only with RLE and UEs) Patient left: in bed;with call bell/phone within reach        Time: 0900-0932 PT Time Calculation (min) (ACUTE ONLY): 32 min   Charges:   PT Evaluation $PT Eval High Complexity: 1 High PT Treatments $Therapeutic Exercise: 8-22 mins        Rollen Sox, PT # [phone] CGV  cell   Casandra Doffing 09/09/2019, 9:53 AM

## 2019-09-09 NOTE — Progress Notes (Signed)
Occupational Therapy Evaluation Patient Details Name: Natalie Macdonald MRN: [id number] DOB: 08-01-1955 Today's Date: 09/09/2019    History of Present Illness 64 year old female admitted with decub ulcers; PMH includes DM with charcot joint, fibromyalgia, GERD, HTN, Hypothyroidism, left hemiplegia, UTIs- indwelling catheter and has colostomy.   Clinical Impression   Patient reports living at home with family who provides 24 hour caregiver assist for self-care tasks and functional mobility. Patient was able to complete grooming and self-feeding at PLOF with set-up assist. Patient demonstrates decreased grip strength in L UE, increase in finger flexion of PIP joints for digits 2-4 resulting in difficulty with composite and isolated finger extension. Patient will benefit from continued skilled OT acute services for application, education and splint management to L UE to promote finger extension and proper hand hygiene.    Follow Up Recommendations  Home health OT    Equipment Recommendations  None recommended by OT    Recommendations for Other Services       Precautions / Restrictions Precautions Precautions: Fall;Other (comment)(Contact) Restrictions Weight Bearing Restrictions: No Other Position/Activity Restrictions: She is essentially bed bound. Due to decubs, she has not been ambulatory for several months, although she relates she had been able to walk with [facility] prior to the wounds.      Mobility Bed Mobility Overal bed mobility: Needs Assistance Bed Mobility: Rolling Rolling: Total assist;Max assist;+2 for safety/equipment            Transfers                 General transfer comment: patient reports use of hoyer in home. Decline OOB transfer at this time     Balance                                           ADL either performed or assessed with clinical judgement   ADL Overall ADL's : At baseline                                       General ADL Comments: set-up for self-feeding and grooming, total assist for bathing, dressing, and toileting     Vision Baseline Vision/History: Wears glasses Wears Glasses: At all times       Perception     Praxis      Pertinent Vitals/Pain Pain Assessment: No/denies pain     Hand Dominance Right   Extremity/Trunk Assessment Upper Extremity Assessment Upper Extremity Assessment: Generalized weakness;RUE deficits/detail;LUE deficits/detail RUE: (Grossly 3+/5, AROM WFL) LUE: (Grossly 3+/5, AROM WFL) LUE Coordination: decreased fine motor(increase in finger flexion for digits 2-4)   Lower Extremity Assessment Lower Extremity Assessment: Defer to PT evaluation       Communication Communication Communication: No difficulties   Cognition Arousal/Alertness: Awake/alert Behavior During Therapy: WFL for tasks assessed/performed                                       General Comments       Exercises Exercises: Hand exercises Hand Exercises Forearm Supination: AROM;5 reps Forearm Pronation: AROM;5 reps Wrist Flexion: AROM;5 reps Wrist Extension: AROM;5 reps Digit Composite Flexion: AROM;5 reps Composite Extension: AROM;5 reps Opposition: AROM;5 reps   Shoulder Instructions  Home Living Family/patient expects to be discharged to:: Private residence Living Arrangements: Spouse/significant other;Children Available Help at Discharge: Family Type of Home: House Home Access: [place]: [place]: Wheelchair - [employer] - 2 wheels;Walker - 4 wheels;Bedside commode;Hospital bed;Other (comment)(Hoyer)          Prior Functioning/Environment Level of Independence: Needs assistance  Gait / Transfers Assistance Needed: She relates that she has not been able to sit in wheelchair recently due to her wounds, the fibromyalgia pain and the wound pain ADL's / Homemaking Assistance  Needed: Is essentially total care. Relates that an Therapist, sports and a physician come to her home. Her daughter does all housework, cooking, but does work outside the home. Daughter does all driving for groceries. Communication / Swallowing Assistance Needed: [facility])          OT Problem List: Decreased range of motion;Decreased activity tolerance;Decreased strength;Decreased coordination      OT Treatment/Interventions: Splinting;Self-care/ADL training;Therapeutic exercise;Patient/family education;Therapeutic activities    OT Goals(Current goals can be found in the care plan section) Acute Rehab OT Goals Patient Stated Goal: to go home  OT Goal Formulation: With patient  OT Frequency: Min 3X/week   Barriers to D/C:            Co-evaluation              AM-PAC OT "6 Clicks" Daily Activity     Outcome Measure Help from another person eating meals?: A Little Help from another person taking care of personal grooming?: A Little Help from another person toileting, which includes using toliet, bedpan, or urinal?: Total Help from another person bathing (including washing, rinsing, drying)?: Total Help from another person to put on and taking off regular upper body clothing?: Total Help from another person to put on and taking off regular lower body clothing?: Total 6 Click Score: 10   End of Session    Activity Tolerance:   Patient left: in bed;with call bell/phone within reach  OT Visit Diagnosis: Muscle weakness (generalized) (M62.81)                Time: [id number] OT Time Calculation (min): 26 min Charges:  OT General Charges $OT Visit: 1 Visit OT Evaluation $OT Eval Moderate Complexity: 1 Mod OT Treatments $Therapeutic Exercise: 8-22 mins  Bliss Behnke OTR/L   Sarissa Dern 09/09/2019, 2:53 PM

## 2019-09-09 NOTE — ED Notes (Addendum)
Attempted to administer pts insulin, but pt refused. Pt states she has reactions to this type of insulin and would like to speak with the provider. Provider notified. Pts current cbg 172

## 2019-09-09 NOTE — Consult Note (Addendum)
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  Fully epithelialized periwound without return of melanin to bilateral gluteal folds.  Wound type:pressure  Stage 3 to sacrococcygeal area.  Pressure Injury POA: Yes Measurement: 5.3 cmx 6 cm x 0.4 cm  Wound [id number] pink, slick nongranulating Drainage (amount, consistency, odor) minimal serosanguinous   Periwound:scarring    Dressing procedure/placement/frequency: Cleanse sacral wound with NS and pat dry.  Apply aquacel to wound bed.  Cover with silicone foam.  Change every three days and PRN soilage.  Turn and reposition every two hours.  Mattress with low air loss feature. Will not follow at this time.  Please re-consult if needed.  Domenic Moras MSN, RN, FNP-BC CWON Wound, Ostomy, Continence Nurse Pager [phone]

## 2019-09-09 NOTE — Progress Notes (Signed)
MD made aware of CBG 340. Pt refusing novolin. Will restart home insulin regimen, she is discharged.

## 2019-09-09 NOTE — ED Notes (Signed)
Report called to floor to nurse [place] for pt transfer

## 2019-09-09 NOTE — Discharge Instructions (Signed)
Natalie Macdonald,  You were in the hospital because of a concern of bacteria in your blood. Thankfully this appears to be a contaminant meaning it is likely not a bacteria from your actual blood but from your skin. If you develop fevers, malaise (feeling really bad), nausea/vomiting, chills, please return for reevaluation.

## 2019-09-09 NOTE — Progress Notes (Signed)
Spoke with lab to inform her not to take HIV drawing. Pt is discharged and has had 2 lab draws so far today.

## 2019-09-09 NOTE — Discharge Summary (Addendum)
Physician Discharge Summary  Cleta Antkowiak [id number] DOB: 15-Mar-1956 DOA: 09/08/2019  PCP: Baldo Ash, MD  Admit date: 09/08/2019 Discharge date: 09/09/2019  Admitted From: Home Disposition: Home  Recommendations for Outpatient Follow-up:  1. Follow up with PCP in 1 week 2. Recheck TSH in 4-6 weeks 3. Please follow up on the following pending results: Blood culture  Home Health: PT, OT, RN Equipment/Devices: None  Discharge Condition: Stable CODE STATUS: Full code Diet recommendation: Heart healthy/carb modified   Brief/Interim Summary:  Admission HPI written by Toy Baker, MD   HPI: Natalie Macdonald is a 64 y.o. female with medical history significant of sarcoidosis decubitus ulcers, diabetes mellitus with Charcot joint, fibromyalgia, GERD, hypertension, hypothyroidism, left hemiplegia,      Presented to emergency department because she was called and instructed to come.  EMS was called for transport.  She presents because she was called to be notified at one of her blood cultures were positive  Recently has been diagnosed with UTI on 4 March started on antibiotics Bactrim but continued to have subjective fevers one of her cultures grew gram-positive rods and she was instructed to come back to have further evaluation done. Earlier last week she also was endorsing some upper and lower extremity numbness and MRI was done that showed some enhancement unclear if acute or chronic neurosurgery [name] was consulted and plan was to follow-up with her surgeon [name] an outpatient  She has known sacral pressure ulcer followed by home health She has history of recurrent UTIs secondary to indwelling Foley catheter She has been having some persistent back pain and foul-smelling urine.   Hospital course:  Positive blood culture test Likely a contaminant. Organism is diphtheroids. Discussed with infectious disease who recommended discontinuation of  antibiotics as culture result is likely a false positive from a contaminant. Patient was given Ceftriaxone while in the hospital.  Hypothyroidism TSH of 35.335. Patient has been taking Synthroid 150 mcg faithfully. Increase dose to Synthroid 175 mcg. Recommend follow-up with PCP. Discussed with patient. Will increase to Synthroid 168 mcg daily (112 mcg 1.5 tablets daily). Recheck TSH in 4-6 weeks.  Yeast cystitis Given Fluconazole.  Left hemiplegia Hyperlipidemia Hypertension Sarcoidosis Diabetes mellitus, type 2 Sacral decubitus ulcer, POA   Discharge Diagnoses:  Principal Problem:   Positive blood culture Active Problems:   Diabetes mellitus type 2 with complications (HCC)   Hyperlipidemia, unspecified   Hypertension   Left hemiplegia ([place])   Sarcoidosis   Yeast cystitis    Discharge Instructions  Discharge Instructions    Call MD for:  persistant nausea and vomiting   Complete by: As directed    Call MD for:  temperature >100.4   Complete by: As directed      Allergies as of 09/09/2019      Reactions   Grapefruit Bioflavonoid Complex Hives, Swelling, Other (See Comments)   Caused her to get hives, swelling of the eye, throat    Latex Hives   Procaine Anaphylaxis   Procaine Anaphylaxis   Grapefruit Flavor [flavoring Agent]    Novolog [insulin Aspart]    Okay to use Regular insulin (patient doesn't tolerate Novolog)    Penicillins Hives, Itching, Swelling   Tolerates Keflex, Augmentin (2021)      Medication List    STOP taking these medications   cephALEXin 500 MG capsule Commonly known as: Keflex   fluconazole 150 MG tablet Commonly known as: DIFLUCAN     TAKE these medications   amitriptyline  100 MG tablet Commonly known as: ELAVIL Take 100 mg by mouth at bedtime.   amLODipine-benazepril 5-40 MG capsule Commonly known as: LOTREL Take 1 capsule by mouth daily.   Apidra 100 UNIT/ML injection Generic drug: insulin glulisine Inject 18-25 Units  into the skin 3 (three) times daily with meals. Sliding Scale   baclofen 10 MG tablet Commonly known as: LIORESAL Take 10 mg by mouth 3 (three) times daily as needed for muscle spasms.   CALCIUM 600 PO Take 600 mg by mouth daily.   carvedilol 25 MG tablet Commonly known as: COREG Take 25 mg by mouth 2 (two) times daily with a meal.   cetirizine 10 MG tablet Commonly known as: ZYRTEC Take 10 mg by mouth daily as needed for allergies.   chlorthalidone 25 MG tablet Commonly known as: HYGROTON Take 25 mg by mouth daily.   CRANBERRY PO Take 2 capsules by mouth daily.   diclofenac 75 MG EC tablet Commonly known as: VOLTAREN Take 75 mg by mouth 2 (two) times daily.   DULoxetine 30 MG capsule Commonly known as: CYMBALTA Take 60 mg by mouth daily.   fluticasone 50 MCG/ACT nasal spray Commonly known as: FLONASE Place 2 sprays into both nostrils daily as needed for allergies.   gabapentin 300 MG capsule Commonly known as: NEURONTIN Take 300 mg by mouth 3 (three) times daily as needed (for pain).   hydrOXYzine 10 MG tablet Commonly known as: ATARAX/VISTARIL Take 10 mg by mouth every 6 (six) hours as needed for itching or anxiety.   levothyroxine 112 MCG tablet Commonly known as: SYNTHROID Take 1.5 tablets (168 mcg total) by mouth daily before breakfast. What changed:   medication strength  how much to take   multivitamin tablet Take 1 tablet by mouth daily.   Oxycodone HCl 10 MG Tabs Take 20 mg by mouth every 4 (four) hours as needed (for pain).   Oxycodone HCl 20 MG Tabs Take 1 tablet by mouth every 4 (four) hours.   pantoprazole 40 MG tablet Commonly known as: PROTONIX Take 40 mg by mouth daily.   pravastatin 20 MG tablet Commonly known as: PRAVACHOL Take 20 mg by mouth daily.   PROBIOTIC PO Take 2 capsules by mouth daily.   ranitidine 75 MG tablet Commonly known as: ZANTAC Take 150 mg by mouth daily as needed for heartburn.   silver sulfADIAZINE 1 %  cream Commonly known as: SILVADENE Apply 1 application topically 2 (two) times daily.   sulfamethoxazole-trimethoprim 800-160 MG tablet Commonly known as: BACTRIM DS Take 1 tablet by mouth 2 (two) times daily for 7 days.   Toujeo SoloStar 300 UNIT/ML Solostar Pen Generic drug: insulin glargine (1 Unit Dial) Inject 40-50 Units into the skin at bedtime. Sliding Scale   vitamin B-12 1000 MCG tablet Commonly known as: CYANOCOBALAMIN Take 1,000 mcg by mouth daily.   vitamin B-6 250 MG tablet Take 250 mg by mouth daily.   vitamin E 180 MG (400 UNITS) capsule Take 400 Units by mouth daily.       Allergies  Allergen Reactions  . Grapefruit Bioflavonoid Complex Hives, Swelling and Other (See Comments)    Caused her to get hives, swelling of the eye, throat   . Latex Hives  . Procaine Anaphylaxis  . Procaine Anaphylaxis  . Grapefruit Flavor [Flavoring Agent]   . Novolog [Insulin Aspart]     Okay to use Regular insulin (patient doesn't tolerate Novolog)    . Penicillins Hives, Itching and Swelling  Tolerates Keflex, Augmentin (2021)    Consultations:  Infectious disease (telephone discussion)   Procedures/Studies: MR CERVICAL SPINE W WO CONTRAST  Result Date: 09/05/2019 CLINICAL DATA:  Cervical radiculopathy. Prior cervical spine surgery. Upper and lower extremity numbness and tingling. EXAM: MRI CERVICAL SPINE WITHOUT AND WITH CONTRAST TECHNIQUE: Multiplanar and multiecho pulse sequences of the cervical spine, to include the craniocervical junction and cervicothoracic junction, were obtained without and with intravenous contrast. CONTRAST:  35mL GADAVIST GADOBUTROL 1 MMOL/ML IV SOLN COMPARISON:  Report from a cervical spine MRI from [facility] dated 02/18/2016 (images not available). FINDINGS: The study is mildly motion degraded. Alignment: Normal. Vertebrae: C6 corpectomy and C5-C7 anterior fusion. No fracture, suspicious osseous lesion, or significant marrow edema. Cord:  Mild patchy enhancement in the cord bilaterally at C5-6 spanning less than 1 cm in craniocaudal extent. 8 mm rounded area of T2 hyperintensity in the right half of the cord at this level which appears mildly enlarged and with volume loss in the left aspect of the cord. Slight prominence of the central canal of the spinal cord more proximally without frank syrinx. Posterior Fossa, vertebral arteries, paraspinal tissues: Chronic right cerebellar infarct. Preserved vertebral artery flow voids. Disc levels: C2-3: Negative. C3-4: Mild disc bulging and left uncovertebral spurring result in mild-to-moderate left neural foraminal stenosis without spinal stenosis. C4-5: Mild disc bulging and uncovertebral spurring result in mild bilateral neural foraminal stenosis without spinal stenosis. C5-6: Anterior fusion. Widely patent spinal canal. Mild-to-moderate right and mild left neural foraminal stenosis due to spurring. C6-7: Anterior fusion. Widely patent spinal canal. Mild to moderate bilateral neural foraminal stenosis due to spurring. C7-T1: Disc bulging, a superimposed left paracentral disc protrusion, and uncovertebral spurring result in mild spinal stenosis and mild left greater than right neural foraminal stenosis. T1-2: Mild disc bulging and left foraminal spurring result in mild left neural foraminal stenosis without spinal stenosis. IMPRESSION: 1. C6 corpectomy and C5-C7 anterior fusion with widely patent spinal canal and mild-to-moderate neural foraminal narrowing due to spurring. 2. Abnormal enhancement and T2 hyperintensity in the spinal cord at C5-6 mild cord enlargement on the right and mild cord volume loss on the left. 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 An active inflammatory,  infectious, or demyelinating process is not excluded however (particularly in this patient with a history of sarcoidosis). Recommend follow-up MRI in 3 months to assess stability, and consider lumbar puncture if there is clinical concern for an acute process. 3. Mild spinal stenosis and mild neural foraminal stenosis at C7-T1. 4. Mild-to-moderate left neural foraminal stenosis at C3-4. Electronically Signed   By: Logan Bores M.D.   On: 09/05/2019 10:00   DG Chest Port 1 View  Result Date: 09/08/2019 CLINICAL DATA:  Bacteremia. EXAM: PORTABLE CHEST 1 VIEW COMPARISON:  April 24, 2019. FINDINGS: The heart size and mediastinal contours are within normal limits. Both lungs are clear. There is no pleural effusion, pneumothorax, pulmonary edema or pneumonia. There are mild to advanced skeletal degenerative changes most pronounced at the left glenohumeral joint, as well as remote C6-T1 ACDF. Telemetry leads are present. There is no free subdiaphragmatic air. IMPRESSION: No pneumonia or free subdiaphragmatic air. Chronic skeletal findings. Electronically Signed   By: Revonda Humphrey   On: 09/08/2019 22:14     Subjective: Sinus headache. No fever or  chills.  Discharge Exam: Vitals:   09/09/19 0932 09/09/19 1013  BP:  124/71  Pulse:  75  Resp:  16  Temp:  (!) 97.5 F (36.4 C)  SpO2: 98% 98%   Vitals:   09/09/19 0247 09/09/19 0622 09/09/19 0932 09/09/19 1013  BP:  (!) 145/91  124/71  Pulse:  73  75  Resp:  18  16  Temp:  (!) 97.5 F (36.4 C)  (!) 97.5 F (36.4 C)  TempSrc:  Oral  Oral  SpO2:  98% 98% 98%  Weight: 106.5 kg     Height: 5\' 6"  (1.676 m)       General: Pt is alert, awake, not in acute distress Cardiovascular: RRR, S1/S2 +, no rubs, no gallops Respiratory: CTA bilaterally, no wheezing, no rhonchi Abdominal: Soft, NT, ND, bowel sounds + Extremities: no edema, no cyanosis    The results of significant diagnostics from this hospitalization (including imaging, microbiology,  ancillary and laboratory) are listed below for reference.     Microbiology: Recent Results (from the past 240 hour(s))  Blood Culture (routine x 2)     Status: Abnormal   Collection Time: 09/04/19 11:38 PM   Specimen: BLOOD  Result Value Ref Range Status   Specimen Description   Final    BLOOD RIGHT ANTECUBITAL Performed at [facility] [address], [place], [place] [address]    Special Requests   Final    BOTTLES DRAWN AEROBIC AND ANAEROBIC Blood Culture adequate volume Performed at [facility] [address], [place], [place] [address]    Culture  Setup Time   Final    GRAM POSITIVE RODS CRITICAL RESULT CALLED TO, READ BACK BY AND VERIFIED WITH: RN CINDY R. [id number] FCP    Culture (A)  Final    DIPHTHEROIDS(CORYNEBACTERIUM SPECIES) Standardized susceptibility testing for this organism is not available. Performed at [facility], [place] [address] [place], [place] [address]    Report Status 09/09/2019 FINAL  Final  Blood Culture (routine x 2)     Status: None (Preliminary result)   Collection Time: 09/04/19 11:38 PM   Specimen: BLOOD RIGHT HAND  Result Value Ref Range Status   Specimen Description   Final    BLOOD RIGHT HAND Performed at [facility] [address], [place], [place] [address]    Special Requests   Final    BOTTLES DRAWN AEROBIC AND ANAEROBIC Blood Culture adequate volume Performed at [facility] [address] [place], [place] [address]    Culture   Final    NO GROWTH 3 DAYS Performed at [facility], [place] [address] [place], [place] [address]    Report Status PENDING  Incomplete  Urine culture     Status: Abnormal   Collection Time: 09/05/19  1:50 AM   Specimen: Urine, Random  Result Value Ref Range Status   Specimen Description   Final    URINE, RANDOM Performed at [facility] [address], [place], [place] [address]     Special Requests   Final    NONE Performed at [facility], [place] [address], [place],  [address]    Culture MULTIPLE SPECIES PRESENT, SUGGEST RECOLLECTION (A)  Final   Report Status 09/05/2019 FINAL  Final  SARS CORONAVIRUS 2 (TAT 6-24 HRS) Nasopharyngeal Nasopharyngeal Swab     Status: None   Collection Time: 09/08/19 11:36 PM   Specimen: Nasopharyngeal Swab  Result Value Ref Range Status   SARS Coronavirus 2 NEGATIVE NEGATIVE Final    Comment: (NOTE) SARS-CoV-2 target nucleic acids are NOT DETECTED. The SARS-CoV-2 RNA is generally detectable in upper and lower respiratory specimens during the acute phase of infection. Negative results do not preclude SARS-CoV-2 infection, do not rule out co-infections with other pathogens, and should not be used as the sole basis for treatment or other patient management decisions. Negative results must be combined with clinical observations, patient history, and epidemiological information. The expected result is Negative. Fact Sheet for Patients: SugarRoll.be Fact Sheet for Healthcare Providers: https://www.woods-mathews.com/ This test is not yet approved or cleared by the [place] FDA and  has been authorized for detection and/or diagnosis of SARS-CoV-2 by FDA under an Emergency Use Authorization (EUA). This EUA will remain  in effect (meaning this test can be used) for the duration of the COVID-19 declaration under Section 56 4(b)(1) of the Act, 21 U.S.C. section 360bbb-3(b)(1), unless the authorization is terminated or revoked sooner. Performed at [facility], [place] [address] [place], [place] [address]      Labs: BNP (last 3 results) No results for input(s): BNP in the last 8760 hours. Basic Metabolic Panel: Recent Labs  Lab 09/04/19 2338 09/08/19 2122 09/09/19 0725  NA 136 136 133*  K 4.3 3.9 4.3  CL 102 102 101  CO2 26 25 22   GLUCOSE 319* 122* 255*    BUN 23 12 12   CREATININE 1.09* 0.68 0.69  CALCIUM 8.9 9.6 9.1  MG  --   --  1.9  PHOS  --   --  3.4   Liver Function Tests: Recent Labs  Lab 09/04/19 2338 09/08/19 2122 09/09/19 0725  AST 23 20 23   ALT 22 23 24   ALKPHOS 93 88 79  BILITOT 0.5 0.4 0.5  PROT 7.4 8.1 7.5  ALBUMIN 3.4* 3.8 3.5   No results for input(s): LIPASE, AMYLASE in the last 168 hours. No results for input(s): AMMONIA in the last 168 hours. CBC: Recent Labs  Lab 09/04/19 2338 09/08/19 2122 09/09/19 0725  WBC 13.6* 9.9 9.0  NEUTROABS 8.3*  --   --   HGB 12.0 12.8 12.2  HCT 38.8 40.5 38.5  MCV 94.6 92.9 92.1  PLT 239 275 237   Cardiac Enzymes: No results for input(s): CKTOTAL, CKMB, CKMBINDEX, TROPONINI in the last 168 hours. BNP: Invalid input(s): POCBNP CBG: Recent Labs  Lab 09/08/19 2331 09/09/19 0415 09/09/19 0758 09/09/19 1219  GLUCAP 172* 242* 232* 340*   D-Dimer No results for input(s): DDIMER in the last 72 hours. Hgb A1c Recent Labs    09/09/19 0725  HGBA1C 11.1*   Lipid Profile No results for input(s): CHOL, HDL, LDLCALC, TRIG, CHOLHDL, LDLDIRECT in the last 72 hours. Thyroid function studies Recent Labs    09/09/19 0725  TSH 35.335*   Anemia work up No results for input(s): VITAMINB12, FOLATE, FERRITIN, TIBC, IRON, RETICCTPCT in the last 72 hours. Urinalysis    Component Value Date/Time   COLORURINE YELLOW 09/08/2019 2122   APPEARANCEUR HAZY (A) 09/08/2019 2122   LABSPEC 1.008 09/08/2019 2122   PHURINE 7.0 09/08/2019 2122   GLUCOSEU NEGATIVE 09/08/2019 2122   HGBUR NEGATIVE 09/08/2019 2122   BILIRUBINUR NEGATIVE 09/08/2019 2122   KETONESUR NEGATIVE 09/08/2019 2122   PROTEINUR NEGATIVE 09/08/2019 2122   NITRITE NEGATIVE 09/08/2019 2122   LEUKOCYTESUR LARGE (A) 09/08/2019 2122   Sepsis Labs Invalid input(s): PROCALCITONIN,  WBC,  LACTICIDVEN Microbiology Recent Results (from  the past 240 hour(s))  Blood Culture (routine x 2)     Status: Abnormal    Collection Time: 09/04/19 11:38 PM   Specimen: BLOOD  Result Value Ref Range Status   Specimen Description   Final    BLOOD RIGHT ANTECUBITAL Performed at [facility] [address] [place], [place] [address]    Special Requests   Final    BOTTLES DRAWN AEROBIC AND ANAEROBIC Blood Culture adequate volume Performed at [facility] [address] [place], [place] [address]    Culture  Setup Time   Final    GRAM POSITIVE RODS CRITICAL RESULT CALLED TO, READ BACK BY AND VERIFIED WITH: RN CINDY R. [id number] FCP    Culture (A)  Final    DIPHTHEROIDS(CORYNEBACTERIUM SPECIES) Standardized susceptibility testing for this organism is not available. Performed at [facility], [place] [address], [place], [place] [address]    Report Status 09/09/2019 FINAL  Final  Blood Culture (routine x 2)     Status: None (Preliminary result)   Collection Time: 09/04/19 11:38 PM   Specimen: BLOOD RIGHT HAND  Result Value Ref Range Status   Specimen Description   Final    BLOOD RIGHT HAND Performed at [facility] [address], [place], [place] [address]    Special Requests   Final    BOTTLES DRAWN AEROBIC AND ANAEROBIC Blood Culture adequate volume Performed at [facility] [address] [place], [place] [address]    Culture   Final    NO GROWTH 3 DAYS Performed at [facility], [place] [address], [place], [place] [address]    Report Status PENDING  Incomplete  Urine culture     Status: Abnormal   Collection Time: 09/05/19  1:50 AM   Specimen: Urine, Random  Result Value Ref Range Status   Specimen Description   Final    URINE, RANDOM Performed at [facility] [address] [place], [place] [address]    Special Requests   Final    NONE Performed at [facility], [place] [address] [place], [place] [address]    Culture MULTIPLE SPECIES PRESENT, SUGGEST  RECOLLECTION (A)  Final   Report Status 09/05/2019 FINAL  Final  SARS CORONAVIRUS 2 (TAT 6-24 HRS) Nasopharyngeal Nasopharyngeal Swab     Status: None   Collection Time: 09/08/19 11:36 PM   Specimen: Nasopharyngeal Swab  Result Value Ref Range Status   SARS Coronavirus 2 NEGATIVE NEGATIVE Final    Comment: (NOTE) SARS-CoV-2 target nucleic acids are NOT DETECTED. The SARS-CoV-2 RNA is generally detectable in upper and lower respiratory specimens during the acute phase of infection. Negative results do not preclude SARS-CoV-2 infection, do not rule out co-infections with other pathogens, and should not be used as the sole basis for treatment or other patient management decisions. Negative results must be combined with clinical observations, patient history, and epidemiological information. The expected result is Negative. Fact Sheet for Patients: SugarRoll.be Fact Sheet for Healthcare Providers: https://www.woods-mathews.com/ This test is not yet approved or cleared by the [place] FDA and  has been authorized for detection and/or diagnosis of SARS-CoV-2 by FDA under an Emergency Use Authorization (EUA). This EUA will remain  in effect (meaning this test can be used) for the duration of the COVID-19 declaration under Section 56 4(b)(1) of the Act, 21 U.S.C. section 360bbb-3(b)(1), unless the authorization is terminated or revoked sooner. Performed at [facility], [place] [address], [place], [place] [address]     SIGNED:   Cordelia Poche, MD [facility] 09/09/2019, 12:57 PM

## 2019-09-09 NOTE — Progress Notes (Signed)
Paged on call Provider for patients blood sugar and regarding insulin issues with the patient.

## 2019-09-10 ENCOUNTER — Telehealth: Payer: Self-pay | Admitting: Emergency Medicine

## 2019-09-10 LAB — CULTURE, BLOOD (ROUTINE X 2)
Culture: NO GROWTH
Special Requests: ADEQUATE

## 2019-09-10 NOTE — Telephone Encounter (Signed)
Post ED Visit - Positive Culture Follow-up  Culture report reviewed by antimicrobial stewardship pharmacist: [facility] Team []  Elenor Quinones, Pharm.D. []  Heide Guile, Pharm.D., BCPS AQ-ID []  Parks Neptune, Pharm.D., BCPS []  Alycia Rossetti, Pharm.D., BCPS []  [place], Pharm.D., BCPS, AAHIVP []  Legrand Como, Pharm.D., BCPS, AAHIVP []  Salome Arnt, PharmD, BCPS []  Johnnette Gourd, PharmD, BCPS []  Hughes Better, PharmD, BCPS []  Leeroy Cha, PharmD []  Laqueta Linden, PharmD, BCPS []  Albertina Parr, PharmD  [facility] Team [x]  Leodis Sias, PharmD []  Lindell Spar, PharmD []  Royetta Asal, PharmD []  Graylin Shiver, Rph []  Rema Fendt) Glennon Mac, PharmD []  Arlyn Dunning, PharmD []  Netta Cedars, PharmD []  Dia Sitter, PharmD []  Leone [place], PharmD []  Gretta Arab, PharmD []  Theodis Shove, PharmD []  Peggyann Juba, PharmD []  Reuel Boom, PharmD   Positive blood culture Seen as contaminant, no further patient follow-up is required at this time.  Hazle Nordmann 09/10/2019, 12:51 PM

## 2019-09-13 LAB — CULTURE, BLOOD (SINGLE)
Culture: NO GROWTH
Special Requests: ADEQUATE

## 2020-03-19 IMAGING — DX DG CHEST 1V PORT
1 series · 1 of 1 positions shown · non-contrast
Comparison: January 03, 2018

CLINICAL DATA: Status postextubation

EXAM:
PORTABLE CHEST 1 VIEW

[chest ap]
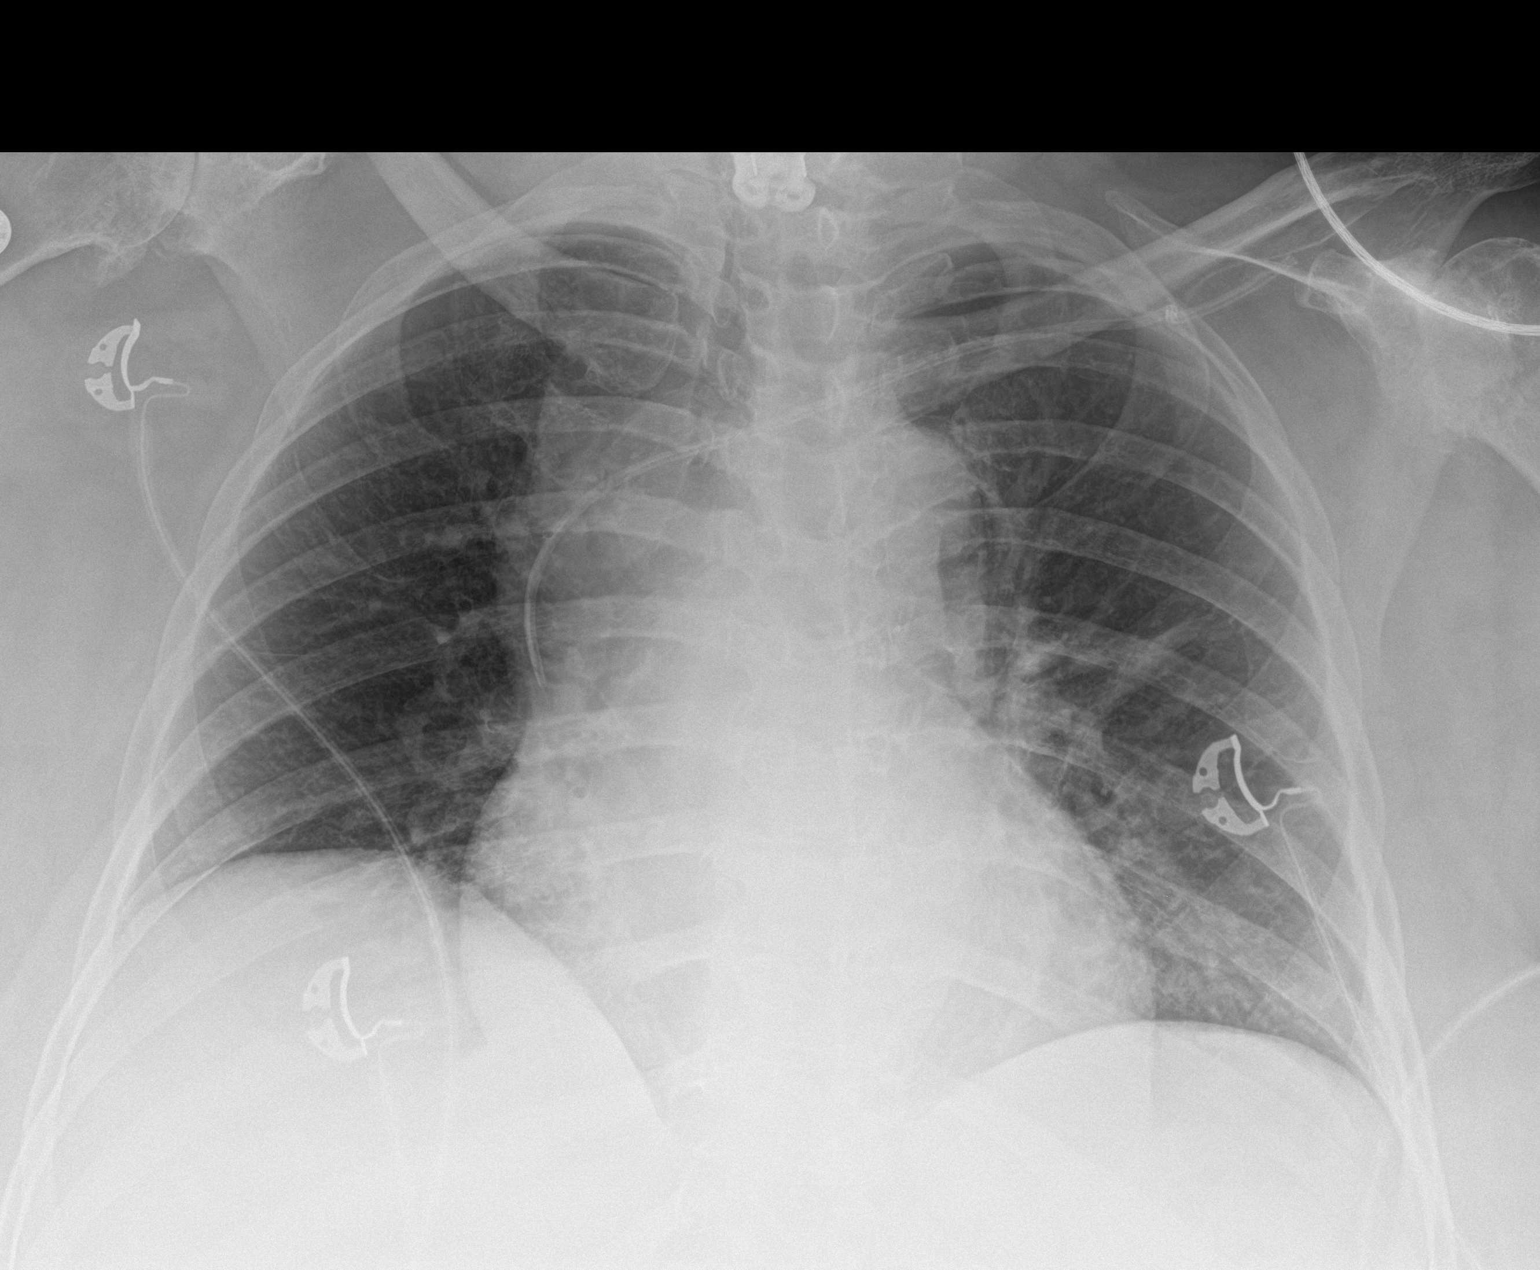

[1 of 1 positions shown; findings below may reference images not displayed]

FINDINGS: Endotracheal tube and nasogastric tube have been removed. Central
catheter tip is in the superior vena cava. No pneumothorax.

There is mild atelectasis in the left mid lung. Lungs elsewhere are
clear. Heart is mildly enlarged with pulmonary vascularity normal.
No adenopathy. There is advanced osteoarthritic change in each
shoulder. There is postoperative change in the lower cervical spine.
IMPRESSION: Central catheter tip in superior vena cava. No pneumothorax. Mild
left midlung atelectasis. Lungs elsewhere clear. Stable
cardiomegaly. Advanced arthropathy both shoulders.

## 2020-07-06 DIAGNOSIS — E1165 Type 2 diabetes mellitus with hyperglycemia: Secondary | ICD-10-CM | POA: Diagnosis not present

## 2020-07-09 DIAGNOSIS — Z433 Encounter for attention to colostomy: Secondary | ICD-10-CM | POA: Diagnosis not present

## 2020-07-12 DIAGNOSIS — L89154 Pressure ulcer of sacral region, stage 4: Secondary | ICD-10-CM | POA: Diagnosis not present

## 2020-07-12 DIAGNOSIS — R339 Retention of urine, unspecified: Secondary | ICD-10-CM | POA: Diagnosis not present

## 2020-07-14 DIAGNOSIS — L89154 Pressure ulcer of sacral region, stage 4: Secondary | ICD-10-CM | POA: Diagnosis not present

## 2020-07-30 DIAGNOSIS — G890 Central pain syndrome: Secondary | ICD-10-CM | POA: Diagnosis not present

## 2020-07-30 DIAGNOSIS — G8221 Paraplegia, complete: Secondary | ICD-10-CM | POA: Diagnosis not present

## 2020-07-30 DIAGNOSIS — M542 Cervicalgia: Secondary | ICD-10-CM | POA: Diagnosis not present

## 2020-07-30 DIAGNOSIS — M961 Postlaminectomy syndrome, not elsewhere classified: Secondary | ICD-10-CM | POA: Diagnosis not present

## 2020-07-30 DIAGNOSIS — M25552 Pain in left hip: Secondary | ICD-10-CM | POA: Diagnosis not present

## 2020-08-05 DIAGNOSIS — E1165 Type 2 diabetes mellitus with hyperglycemia: Secondary | ICD-10-CM | POA: Diagnosis not present

## 2020-08-17 DIAGNOSIS — L89154 Pressure ulcer of sacral region, stage 4: Secondary | ICD-10-CM | POA: Diagnosis not present

## 2020-08-17 DIAGNOSIS — Z433 Encounter for attention to colostomy: Secondary | ICD-10-CM | POA: Diagnosis not present

## 2020-08-20 DIAGNOSIS — R339 Retention of urine, unspecified: Secondary | ICD-10-CM | POA: Diagnosis not present

## 2020-08-23 DIAGNOSIS — L89154 Pressure ulcer of sacral region, stage 4: Secondary | ICD-10-CM | POA: Diagnosis not present

## 2020-08-28 DIAGNOSIS — Z960 Presence of urogenital implants: Secondary | ICD-10-CM | POA: Diagnosis not present

## 2020-08-28 DIAGNOSIS — E1165 Type 2 diabetes mellitus with hyperglycemia: Secondary | ICD-10-CM | POA: Diagnosis not present

## 2020-08-28 DIAGNOSIS — B373 Candidiasis of vulva and vagina: Secondary | ICD-10-CM | POA: Diagnosis not present

## 2020-08-28 DIAGNOSIS — I10 Essential (primary) hypertension: Secondary | ICD-10-CM | POA: Diagnosis not present

## 2020-08-28 DIAGNOSIS — E118 Type 2 diabetes mellitus with unspecified complications: Secondary | ICD-10-CM | POA: Diagnosis not present

## 2020-08-28 DIAGNOSIS — B372 Candidiasis of skin and nail: Secondary | ICD-10-CM | POA: Diagnosis not present

## 2020-09-02 DIAGNOSIS — L89154 Pressure ulcer of sacral region, stage 4: Secondary | ICD-10-CM | POA: Diagnosis not present

## 2020-09-04 DIAGNOSIS — E1165 Type 2 diabetes mellitus with hyperglycemia: Secondary | ICD-10-CM | POA: Diagnosis not present

## 2020-09-25 DIAGNOSIS — L89159 Pressure ulcer of sacral region, unspecified stage: Secondary | ICD-10-CM | POA: Diagnosis not present

## 2020-09-25 DIAGNOSIS — E1165 Type 2 diabetes mellitus with hyperglycemia: Secondary | ICD-10-CM | POA: Diagnosis not present

## 2020-09-25 DIAGNOSIS — J019 Acute sinusitis, unspecified: Secondary | ICD-10-CM | POA: Diagnosis not present

## 2020-09-25 DIAGNOSIS — Z960 Presence of urogenital implants: Secondary | ICD-10-CM | POA: Diagnosis not present

## 2020-09-25 DIAGNOSIS — B373 Candidiasis of vulva and vagina: Secondary | ICD-10-CM | POA: Diagnosis not present

## 2020-10-04 DIAGNOSIS — E1165 Type 2 diabetes mellitus with hyperglycemia: Secondary | ICD-10-CM | POA: Diagnosis not present

## 2020-10-08 DIAGNOSIS — R339 Retention of urine, unspecified: Secondary | ICD-10-CM | POA: Diagnosis not present

## 2020-10-08 DIAGNOSIS — L89154 Pressure ulcer of sacral region, stage 4: Secondary | ICD-10-CM | POA: Diagnosis not present

## 2020-10-12 DIAGNOSIS — L89154 Pressure ulcer of sacral region, stage 4: Secondary | ICD-10-CM | POA: Diagnosis not present

## 2020-10-29 DIAGNOSIS — L89154 Pressure ulcer of sacral region, stage 4: Secondary | ICD-10-CM | POA: Diagnosis not present

## 2020-10-29 DIAGNOSIS — Z433 Encounter for attention to colostomy: Secondary | ICD-10-CM | POA: Diagnosis not present

## 2020-11-01 DIAGNOSIS — R339 Retention of urine, unspecified: Secondary | ICD-10-CM | POA: Diagnosis not present

## 2020-11-03 DIAGNOSIS — E1165 Type 2 diabetes mellitus with hyperglycemia: Secondary | ICD-10-CM | POA: Diagnosis not present

## 2020-11-04 DIAGNOSIS — G890 Central pain syndrome: Secondary | ICD-10-CM | POA: Diagnosis not present

## 2020-11-04 DIAGNOSIS — M961 Postlaminectomy syndrome, not elsewhere classified: Secondary | ICD-10-CM | POA: Diagnosis not present

## 2020-11-04 DIAGNOSIS — M542 Cervicalgia: Secondary | ICD-10-CM | POA: Diagnosis not present

## 2020-11-04 DIAGNOSIS — G8221 Paraplegia, complete: Secondary | ICD-10-CM | POA: Diagnosis not present

## 2020-11-04 DIAGNOSIS — M25552 Pain in left hip: Secondary | ICD-10-CM | POA: Diagnosis not present

## 2020-11-23 DIAGNOSIS — L89154 Pressure ulcer of sacral region, stage 4: Secondary | ICD-10-CM | POA: Diagnosis not present

## 2020-11-23 DIAGNOSIS — Z433 Encounter for attention to colostomy: Secondary | ICD-10-CM | POA: Diagnosis not present

## 2020-11-23 DIAGNOSIS — R339 Retention of urine, unspecified: Secondary | ICD-10-CM | POA: Diagnosis not present

## 2020-11-27 DIAGNOSIS — L282 Other prurigo: Secondary | ICD-10-CM | POA: Diagnosis not present

## 2020-11-27 DIAGNOSIS — G894 Chronic pain syndrome: Secondary | ICD-10-CM | POA: Diagnosis not present

## 2020-11-27 DIAGNOSIS — E1165 Type 2 diabetes mellitus with hyperglycemia: Secondary | ICD-10-CM | POA: Diagnosis not present

## 2020-11-27 DIAGNOSIS — Z960 Presence of urogenital implants: Secondary | ICD-10-CM | POA: Diagnosis not present

## 2020-12-03 DIAGNOSIS — E1165 Type 2 diabetes mellitus with hyperglycemia: Secondary | ICD-10-CM | POA: Diagnosis not present

## 2020-12-08 DIAGNOSIS — G8221 Paraplegia, complete: Secondary | ICD-10-CM | POA: Diagnosis not present

## 2020-12-08 DIAGNOSIS — M25552 Pain in left hip: Secondary | ICD-10-CM | POA: Diagnosis not present

## 2020-12-08 DIAGNOSIS — L89154 Pressure ulcer of sacral region, stage 4: Secondary | ICD-10-CM | POA: Diagnosis not present

## 2020-12-08 DIAGNOSIS — M542 Cervicalgia: Secondary | ICD-10-CM | POA: Diagnosis not present

## 2020-12-08 DIAGNOSIS — Z79891 Long term (current) use of opiate analgesic: Secondary | ICD-10-CM | POA: Diagnosis not present

## 2020-12-08 DIAGNOSIS — G890 Central pain syndrome: Secondary | ICD-10-CM | POA: Diagnosis not present

## 2020-12-08 DIAGNOSIS — G8929 Other chronic pain: Secondary | ICD-10-CM | POA: Diagnosis not present

## 2020-12-08 DIAGNOSIS — G894 Chronic pain syndrome: Secondary | ICD-10-CM | POA: Diagnosis not present

## 2020-12-08 DIAGNOSIS — F1120 Opioid dependence, uncomplicated: Secondary | ICD-10-CM | POA: Diagnosis not present

## 2020-12-08 DIAGNOSIS — M961 Postlaminectomy syndrome, not elsewhere classified: Secondary | ICD-10-CM | POA: Diagnosis not present

## 2020-12-28 DIAGNOSIS — R339 Retention of urine, unspecified: Secondary | ICD-10-CM | POA: Diagnosis not present

## 2020-12-28 DIAGNOSIS — Z433 Encounter for attention to colostomy: Secondary | ICD-10-CM | POA: Diagnosis not present

## 2021-01-02 DIAGNOSIS — E1165 Type 2 diabetes mellitus with hyperglycemia: Secondary | ICD-10-CM | POA: Diagnosis not present

## 2021-01-07 DIAGNOSIS — L89154 Pressure ulcer of sacral region, stage 4: Secondary | ICD-10-CM | POA: Diagnosis not present

## 2021-01-08 DIAGNOSIS — B372 Candidiasis of skin and nail: Secondary | ICD-10-CM | POA: Diagnosis not present

## 2021-01-08 DIAGNOSIS — Z960 Presence of urogenital implants: Secondary | ICD-10-CM | POA: Diagnosis not present

## 2021-01-08 DIAGNOSIS — I10 Essential (primary) hypertension: Secondary | ICD-10-CM | POA: Diagnosis not present

## 2021-01-08 DIAGNOSIS — G894 Chronic pain syndrome: Secondary | ICD-10-CM | POA: Diagnosis not present

## 2021-01-14 DIAGNOSIS — M25552 Pain in left hip: Secondary | ICD-10-CM | POA: Diagnosis not present

## 2021-01-14 DIAGNOSIS — M5412 Radiculopathy, cervical region: Secondary | ICD-10-CM | POA: Diagnosis not present

## 2021-01-14 DIAGNOSIS — M961 Postlaminectomy syndrome, not elsewhere classified: Secondary | ICD-10-CM | POA: Diagnosis not present

## 2021-01-14 DIAGNOSIS — G8221 Paraplegia, complete: Secondary | ICD-10-CM | POA: Diagnosis not present

## 2021-01-14 DIAGNOSIS — G890 Central pain syndrome: Secondary | ICD-10-CM | POA: Diagnosis not present

## 2021-01-31 DIAGNOSIS — R339 Retention of urine, unspecified: Secondary | ICD-10-CM | POA: Diagnosis not present

## 2021-01-31 DIAGNOSIS — Z433 Encounter for attention to colostomy: Secondary | ICD-10-CM | POA: Diagnosis not present

## 2021-02-01 DIAGNOSIS — E1165 Type 2 diabetes mellitus with hyperglycemia: Secondary | ICD-10-CM | POA: Diagnosis not present

## 2021-02-02 DIAGNOSIS — L89154 Pressure ulcer of sacral region, stage 4: Secondary | ICD-10-CM | POA: Diagnosis not present

## 2021-02-02 DIAGNOSIS — L89894 Pressure ulcer of other site, stage 4: Secondary | ICD-10-CM | POA: Diagnosis not present

## 2021-02-19 DIAGNOSIS — Z960 Presence of urogenital implants: Secondary | ICD-10-CM | POA: Diagnosis not present

## 2021-02-19 DIAGNOSIS — R600 Localized edema: Secondary | ICD-10-CM | POA: Diagnosis not present

## 2021-02-19 DIAGNOSIS — L89159 Pressure ulcer of sacral region, unspecified stage: Secondary | ICD-10-CM | POA: Diagnosis not present

## 2021-02-19 DIAGNOSIS — B372 Candidiasis of skin and nail: Secondary | ICD-10-CM | POA: Diagnosis not present

## 2021-02-24 DIAGNOSIS — Z433 Encounter for attention to colostomy: Secondary | ICD-10-CM | POA: Diagnosis not present

## 2021-02-24 DIAGNOSIS — R339 Retention of urine, unspecified: Secondary | ICD-10-CM | POA: Diagnosis not present

## 2021-03-02 DIAGNOSIS — G890 Central pain syndrome: Secondary | ICD-10-CM | POA: Diagnosis not present

## 2021-03-02 DIAGNOSIS — Z79891 Long term (current) use of opiate analgesic: Secondary | ICD-10-CM | POA: Diagnosis not present

## 2021-03-02 DIAGNOSIS — G894 Chronic pain syndrome: Secondary | ICD-10-CM | POA: Diagnosis not present

## 2021-03-02 DIAGNOSIS — M542 Cervicalgia: Secondary | ICD-10-CM | POA: Diagnosis not present

## 2021-03-02 DIAGNOSIS — M961 Postlaminectomy syndrome, not elsewhere classified: Secondary | ICD-10-CM | POA: Diagnosis not present

## 2021-03-02 DIAGNOSIS — G8221 Paraplegia, complete: Secondary | ICD-10-CM | POA: Diagnosis not present

## 2021-03-02 DIAGNOSIS — M25552 Pain in left hip: Secondary | ICD-10-CM | POA: Diagnosis not present

## 2021-03-03 DIAGNOSIS — L89154 Pressure ulcer of sacral region, stage 4: Secondary | ICD-10-CM | POA: Diagnosis not present

## 2021-03-03 DIAGNOSIS — E1165 Type 2 diabetes mellitus with hyperglycemia: Secondary | ICD-10-CM | POA: Diagnosis not present

## 2021-03-08 DIAGNOSIS — L89159 Pressure ulcer of sacral region, unspecified stage: Secondary | ICD-10-CM | POA: Diagnosis not present

## 2021-03-08 DIAGNOSIS — Z960 Presence of urogenital implants: Secondary | ICD-10-CM | POA: Diagnosis not present

## 2021-03-08 DIAGNOSIS — E1165 Type 2 diabetes mellitus with hyperglycemia: Secondary | ICD-10-CM | POA: Diagnosis not present

## 2021-03-08 DIAGNOSIS — R600 Localized edema: Secondary | ICD-10-CM | POA: Diagnosis not present

## 2021-03-08 DIAGNOSIS — I10 Essential (primary) hypertension: Secondary | ICD-10-CM | POA: Diagnosis not present

## 2021-03-22 ENCOUNTER — Other Ambulatory Visit: Payer: Self-pay | Admitting: Internal Medicine

## 2021-03-22 DIAGNOSIS — Z1231 Encounter for screening mammogram for malignant neoplasm of breast: Secondary | ICD-10-CM

## 2021-03-28 DIAGNOSIS — Z433 Encounter for attention to colostomy: Secondary | ICD-10-CM | POA: Diagnosis not present

## 2021-03-28 DIAGNOSIS — Z936 Other artificial openings of urinary tract status: Secondary | ICD-10-CM | POA: Diagnosis not present

## 2021-03-30 DIAGNOSIS — M542 Cervicalgia: Secondary | ICD-10-CM | POA: Diagnosis not present

## 2021-03-30 DIAGNOSIS — G890 Central pain syndrome: Secondary | ICD-10-CM | POA: Diagnosis not present

## 2021-03-30 DIAGNOSIS — G8221 Paraplegia, complete: Secondary | ICD-10-CM | POA: Diagnosis not present

## 2021-03-30 DIAGNOSIS — M25552 Pain in left hip: Secondary | ICD-10-CM | POA: Diagnosis not present

## 2021-03-30 DIAGNOSIS — M961 Postlaminectomy syndrome, not elsewhere classified: Secondary | ICD-10-CM | POA: Diagnosis not present

## 2021-04-02 DIAGNOSIS — E1165 Type 2 diabetes mellitus with hyperglycemia: Secondary | ICD-10-CM | POA: Diagnosis not present

## 2021-04-22 ENCOUNTER — Emergency Department (HOSPITAL_COMMUNITY): Payer: Medicare HMO

## 2021-04-22 ENCOUNTER — Inpatient Hospital Stay (HOSPITAL_COMMUNITY)
Admission: EM | Admit: 2021-04-22 | Discharge: 2021-04-27 | DRG: 871 | Disposition: A | Discharge status: Home Health | Payer: Medicare HMO | Attending: Internal Medicine | Admitting: Internal Medicine

## 2021-04-22 ENCOUNTER — Other Ambulatory Visit: Payer: Self-pay

## 2021-04-22 ENCOUNTER — Encounter (HOSPITAL_COMMUNITY): Payer: Self-pay | Admitting: Emergency Medicine

## 2021-04-22 DIAGNOSIS — E1169 Type 2 diabetes mellitus with other specified complication: Secondary | ICD-10-CM | POA: Diagnosis not present

## 2021-04-22 DIAGNOSIS — E669 Obesity, unspecified: Secondary | ICD-10-CM | POA: Diagnosis present

## 2021-04-22 DIAGNOSIS — R6889 Other general symptoms and signs: Secondary | ICD-10-CM | POA: Diagnosis not present

## 2021-04-22 DIAGNOSIS — G8194 Hemiplegia, unspecified affecting left nondominant side: Secondary | ICD-10-CM | POA: Diagnosis present

## 2021-04-22 DIAGNOSIS — Z79890 Hormone replacement therapy (postmenopausal): Secondary | ICD-10-CM | POA: Diagnosis not present

## 2021-04-22 DIAGNOSIS — Z7401 Bed confinement status: Secondary | ICD-10-CM

## 2021-04-22 DIAGNOSIS — N179 Acute kidney failure, unspecified: Secondary | ICD-10-CM | POA: Diagnosis not present

## 2021-04-22 DIAGNOSIS — L89159 Pressure ulcer of sacral region, unspecified stage: Secondary | ICD-10-CM

## 2021-04-22 DIAGNOSIS — E890 Postprocedural hypothyroidism: Secondary | ICD-10-CM | POA: Diagnosis not present

## 2021-04-22 DIAGNOSIS — Z20822 Contact with and (suspected) exposure to covid-19: Secondary | ICD-10-CM | POA: Diagnosis not present

## 2021-04-22 DIAGNOSIS — Z79891 Long term (current) use of opiate analgesic: Secondary | ICD-10-CM | POA: Diagnosis not present

## 2021-04-22 DIAGNOSIS — I10 Essential (primary) hypertension: Secondary | ICD-10-CM | POA: Diagnosis not present

## 2021-04-22 DIAGNOSIS — L8990 Pressure ulcer of unspecified site, unspecified stage: Secondary | ICD-10-CM | POA: Diagnosis present

## 2021-04-22 DIAGNOSIS — E785 Hyperlipidemia, unspecified: Secondary | ICD-10-CM | POA: Diagnosis not present

## 2021-04-22 DIAGNOSIS — K219 Gastro-esophageal reflux disease without esophagitis: Secondary | ICD-10-CM | POA: Diagnosis not present

## 2021-04-22 DIAGNOSIS — G9340 Encephalopathy, unspecified: Secondary | ICD-10-CM | POA: Diagnosis not present

## 2021-04-22 DIAGNOSIS — Z794 Long term (current) use of insulin: Secondary | ICD-10-CM

## 2021-04-22 DIAGNOSIS — G9341 Metabolic encephalopathy: Secondary | ICD-10-CM | POA: Diagnosis not present

## 2021-04-22 DIAGNOSIS — N3000 Acute cystitis without hematuria: Secondary | ICD-10-CM | POA: Diagnosis present

## 2021-04-22 DIAGNOSIS — Z79899 Other long term (current) drug therapy: Secondary | ICD-10-CM | POA: Diagnosis not present

## 2021-04-22 DIAGNOSIS — Z6841 Body Mass Index (BMI) 40.0 and over, adult: Secondary | ICD-10-CM

## 2021-04-22 DIAGNOSIS — L89154 Pressure ulcer of sacral region, stage 4: Secondary | ICD-10-CM | POA: Diagnosis not present

## 2021-04-22 DIAGNOSIS — M797 Fibromyalgia: Secondary | ICD-10-CM | POA: Diagnosis not present

## 2021-04-22 DIAGNOSIS — Z936 Other artificial openings of urinary tract status: Secondary | ICD-10-CM | POA: Diagnosis not present

## 2021-04-22 DIAGNOSIS — M5442 Lumbago with sciatica, left side: Secondary | ICD-10-CM

## 2021-04-22 DIAGNOSIS — E871 Hypo-osmolality and hyponatremia: Secondary | ICD-10-CM

## 2021-04-22 DIAGNOSIS — Z743 Need for continuous supervision: Secondary | ICD-10-CM | POA: Diagnosis not present

## 2021-04-22 DIAGNOSIS — Z933 Colostomy status: Secondary | ICD-10-CM

## 2021-04-22 DIAGNOSIS — I159 Secondary hypertension, unspecified: Secondary | ICD-10-CM

## 2021-04-22 DIAGNOSIS — R410 Disorientation, unspecified: Secondary | ICD-10-CM | POA: Diagnosis not present

## 2021-04-22 DIAGNOSIS — Z433 Encounter for attention to colostomy: Secondary | ICD-10-CM | POA: Diagnosis not present

## 2021-04-22 DIAGNOSIS — E039 Hypothyroidism, unspecified: Secondary | ICD-10-CM

## 2021-04-22 DIAGNOSIS — R6520 Severe sepsis without septic shock: Secondary | ICD-10-CM

## 2021-04-22 DIAGNOSIS — G8220 Paraplegia, unspecified: Secondary | ICD-10-CM | POA: Diagnosis present

## 2021-04-22 DIAGNOSIS — R404 Transient alteration of awareness: Secondary | ICD-10-CM | POA: Diagnosis not present

## 2021-04-22 DIAGNOSIS — E872 Acidosis, unspecified: Secondary | ICD-10-CM | POA: Diagnosis not present

## 2021-04-22 DIAGNOSIS — R0689 Other abnormalities of breathing: Secondary | ICD-10-CM | POA: Diagnosis not present

## 2021-04-22 DIAGNOSIS — G8929 Other chronic pain: Secondary | ICD-10-CM

## 2021-04-22 DIAGNOSIS — D869 Sarcoidosis, unspecified: Secondary | ICD-10-CM | POA: Diagnosis present

## 2021-04-22 DIAGNOSIS — A419 Sepsis, unspecified organism: Secondary | ICD-10-CM | POA: Diagnosis not present

## 2021-04-22 DIAGNOSIS — R4182 Altered mental status, unspecified: Secondary | ICD-10-CM | POA: Diagnosis not present

## 2021-04-22 DIAGNOSIS — E118 Type 2 diabetes mellitus with unspecified complications: Secondary | ICD-10-CM | POA: Diagnosis not present

## 2021-04-22 LAB — COMPREHENSIVE METABOLIC PANEL
ALT: 17 U/L (ref 0–44)
AST: 19 U/L (ref 15–41)
Albumin: 3 g/dL — ABNORMAL LOW (ref 3.5–5.0)
Alkaline Phosphatase: 59 U/L (ref 38–126)
Anion gap: 8 (ref 5–15)
BUN: 25 mg/dL — ABNORMAL HIGH (ref 8–23)
CO2: 29 mmol/L (ref 22–32)
Calcium: 8.5 mg/dL — ABNORMAL LOW (ref 8.9–10.3)
Chloride: 81 mmol/L — ABNORMAL LOW (ref 98–111)
Creatinine, Ser: 1.09 mg/dL — ABNORMAL HIGH (ref 0.44–1.00)
GFR, Estimated: 56 mL/min — ABNORMAL LOW (ref >60)
Glucose, Bld: 227 mg/dL — ABNORMAL HIGH (ref 70–99)
Potassium: 4.6 mmol/L (ref 3.5–5.1)
Sodium: 118 mmol/L — CL (ref 135–145)
Total Bilirubin: 0.7 mg/dL (ref 0.3–1.2)
Total Protein: 6.9 g/dL (ref 6.5–8.1)

## 2021-04-22 LAB — URINALYSIS, ROUTINE W REFLEX MICROSCOPIC
Bilirubin Urine: NEGATIVE
Glucose, UA: NEGATIVE mg/dL
Ketones, ur: NEGATIVE mg/dL
Nitrite: NEGATIVE
Protein, ur: NEGATIVE mg/dL
RBC / HPF: >50 RBC/hpf — ABNORMAL HIGH (ref 0–5)
Specific Gravity, Urine: 1.012 (ref 1.005–1.030)
WBC, UA: >50 WBC/hpf — ABNORMAL HIGH (ref 0–5)
pH: 5 (ref 5.0–8.0)

## 2021-04-22 LAB — CBC WITH DIFFERENTIAL/PLATELET
Abs Immature Granulocytes: 0.11 10*3/uL — ABNORMAL HIGH (ref 0.00–0.07)
Basophils Absolute: 0.1 10*3/uL (ref 0.0–0.1)
Basophils Relative: 0 %
Eosinophils Absolute: 0.2 10*3/uL (ref 0.0–0.5)
Eosinophils Relative: 1 %
HCT: 30.4 % — ABNORMAL LOW (ref 36.0–46.0)
Hemoglobin: 10.2 g/dL — ABNORMAL LOW (ref 12.0–15.0)
Immature Granulocytes: 1 %
Lymphocytes Relative: 17 %
Lymphs Abs: 3.1 10*3/uL (ref 0.7–4.0)
MCH: 29.2 pg (ref 26.0–34.0)
MCHC: 33.6 g/dL (ref 30.0–36.0)
MCV: 87.1 fL (ref 80.0–100.0)
Monocytes Absolute: 1.7 10*3/uL — ABNORMAL HIGH (ref 0.1–1.0)
Monocytes Relative: 10 %
Neutro Abs: 12.7 10*3/uL — ABNORMAL HIGH (ref 1.7–7.7)
Neutrophils Relative %: 71 %
Platelets: 294 10*3/uL (ref 150–400)
RBC: 3.49 MIL/uL — ABNORMAL LOW (ref 3.87–5.11)
RDW: 12.6 % (ref 11.5–15.5)
WBC: 17.8 10*3/uL — ABNORMAL HIGH (ref 4.0–10.5)
nRBC: 0 % (ref 0.0–0.2)

## 2021-04-22 LAB — RESP PANEL BY RT-PCR (FLU A&B, COVID) ARPGX2
Influenza A by PCR: NEGATIVE
Influenza B by PCR: NEGATIVE
SARS Coronavirus 2 by RT PCR: NEGATIVE

## 2021-04-22 LAB — APTT: aPTT: 32 seconds (ref 24–36)

## 2021-04-22 LAB — PROTIME-INR
INR: 1.1 (ref 0.8–1.2)
Prothrombin Time: 14.6 seconds (ref 11.4–15.2)

## 2021-04-22 LAB — LACTIC ACID, PLASMA: Lactic Acid, Venous: 2.3 mmol/L (ref 0.5–1.9)

## 2021-04-22 MED ORDER — LACTATED RINGERS IV SOLN: INTRAVENOUS | Status: DC

## 2021-04-22 MED ORDER — PANTOPRAZOLE SODIUM 40 MG PO TBEC
40.0000 mg | DELAYED_RELEASE_TABLET | Freq: Every day | ORAL | Status: DC
  Administered 2021-04-23 – 2021-04-26 (×3): 40 mg via ORAL
  Filled 2021-04-22 (×4): qty 1

## 2021-04-22 MED ORDER — SODIUM CHLORIDE 0.9 % IV SOLN: INTRAVENOUS | Status: DC

## 2021-04-22 MED ORDER — LACTATED RINGERS IV BOLUS (SEPSIS): 500.0000 mL | Freq: Once | INTRAVENOUS | Status: DC

## 2021-04-22 MED ORDER — LEVOTHYROXINE SODIUM 112 MCG PO TABS
168.0000 ug | ORAL_TABLET | Freq: Every day | ORAL | Status: DC
  Administered 2021-04-23 – 2021-04-26 (×3): 168 ug via ORAL
  Filled 2021-04-22 (×5): qty 2

## 2021-04-22 MED ORDER — ENOXAPARIN SODIUM 40 MG/0.4ML IJ SOSY
40.0000 mg | PREFILLED_SYRINGE | INTRAMUSCULAR | Status: DC
  Administered 2021-04-23 – 2021-04-26 (×4): 40 mg via SUBCUTANEOUS
  Filled 2021-04-22 (×4): qty 0.4

## 2021-04-22 MED ORDER — LACTATED RINGERS IV BOLUS (SEPSIS): 1000.0000 mL | Freq: Once | INTRAVENOUS | Status: DC

## 2021-04-22 MED ORDER — METRONIDAZOLE 500 MG/100ML IV SOLN
500.0000 mg | Freq: Once | INTRAVENOUS | Status: AC
  Administered 2021-04-22: 500 mg via INTRAVENOUS
  Filled 2021-04-22: qty 100

## 2021-04-22 MED ORDER — SODIUM CHLORIDE 0.9 % IV SOLN
2.0000 g | Freq: Once | INTRAVENOUS | Status: AC
  Administered 2021-04-22: 2 g via INTRAVENOUS
  Filled 2021-04-22: qty 2

## 2021-04-22 MED ORDER — PRAVASTATIN SODIUM 20 MG PO TABS
20.0000 mg | ORAL_TABLET | Freq: Every day | ORAL | Status: DC
  Administered 2021-04-23 – 2021-04-26 (×3): 20 mg via ORAL
  Filled 2021-04-22 (×4): qty 1

## 2021-04-22 MED ORDER — VANCOMYCIN HCL IN DEXTROSE 1-5 GM/200ML-% IV SOLN: 1000.0000 mg | Freq: Once | INTRAVENOUS | Status: DC

## 2021-04-22 MED ORDER — ACETAMINOPHEN 325 MG PO TABS
650.0000 mg | ORAL_TABLET | Freq: Four times a day (QID) | ORAL | Status: DC | PRN
  Administered 2021-04-23 – 2021-04-24 (×5): 650 mg via ORAL
  Filled 2021-04-22 (×6): qty 2

## 2021-04-22 MED ORDER — SODIUM CHLORIDE 0.9 % IV BOLUS
1000.0000 mL | Freq: Once | INTRAVENOUS | Status: AC
  Administered 2021-04-22: 1000 mL via INTRAVENOUS

## 2021-04-22 MED ORDER — ACETAMINOPHEN 650 MG RE SUPP: 650.0000 mg | Freq: Four times a day (QID) | RECTAL | Status: DC | PRN

## 2021-04-22 MED ORDER — SODIUM CHLORIDE 0.9% FLUSH
3.0000 mL | Freq: Two times a day (BID) | INTRAVENOUS | Status: DC
  Administered 2021-04-23 – 2021-04-26 (×8): 3 mL via INTRAVENOUS

## 2021-04-22 MED ORDER — POLYETHYLENE GLYCOL 3350 17 G PO PACK
17.0000 g | PACK | Freq: Every day | ORAL | Status: DC | PRN
  Filled 2021-04-22: qty 1

## 2021-04-22 NOTE — ED Triage Notes (Signed)
Patient presents from home due to family concerns she may be having a stroke. They had a tele health visit that informed them they should get her here for that reason. She has had intermittent confusion for that past 2 weeks. Changes in mental status have been witnessed on a hourly bases.   EMS vitals: 100 Temp 130 CBG 118/90 BP 86 HR 96% O2 sat on room air

## 2021-04-22 NOTE — ED Notes (Signed)
Only able to obtain one blood culture at this time.

## 2021-04-22 NOTE — ED Notes (Signed)
ED TO INPATIENT HANDOFF REPORT  ED Nurse Name and Phone #: [phone] Erick Colace, RN   S Name/Age/Gender Myles Lipps 65 y.o. female Room/Bed: WA02/WA02  Code Status   Code Status: Full Code  Home/SNF/Other Home Patient oriented to: self, place, and time, situation  Is this baseline? Yes   Triage Complete: Triage complete  Chief Complaint Sepsis [facility]) [A41.9]  Triage Note Patient presents from home due to family concerns she may be having a stroke. They had a tele health visit that informed them they should get her here for that reason. She has had intermittent confusion for that past 2 weeks. Changes in mental status have been witnessed on a hourly bases.   EMS vitals: 100 Temp 130 CBG 118/90 BP 86 HR 96% O2 sat on room air   Allergies Allergies  Allergen Reactions   Grapefruit Bioflavonoid Complex Hives, Swelling and Other (See Comments)    Caused her to get hives, swelling of the eye, throat    Latex Hives   Procaine Anaphylaxis   Procaine Anaphylaxis   Grapefruit Flavor [Flavoring Agent]    Novolog [Insulin Aspart]     Okay to use Regular insulin (patient doesn't tolerate Novolog)     Penicillins Hives, Itching and Swelling    Tolerates Keflex, Augmentin (2021)    Level of Care/Admitting Diagnosis ED Disposition     ED Disposition  Admit   Condition  --   Comment  Hospital Area: [facility] [[id number]]  Level of Care: Progressive [102]  Admit to Progressive based on following criteria: MULTISYSTEM THREATS such as stable sepsis, metabolic/electrolyte imbalance with or without encephalopathy that is responding to early treatment.  Admit to Progressive based on following criteria: NEPHROLOGY stable condition requiring close monitoring for AKI, requiring Hemodialysis or Peritoneal Dialysis either from expected electrolyte imbalance, acidosis, or fluid overload that can be managed by NIPPV or high flow oxygen.  May admit patient to [facility] or [facility] if equivalent level of care is available:: Yes  Covid Evaluation: Confirmed COVID Negative  Diagnosis: Sepsis [facility]) [[id number]]  Admitting Physician: Marcelyn Bruins [[id number]]  Attending Physician: Marcelyn Bruins [phone]  Estimated length of stay: past midnight tomorrow  Certification:: I certify this patient will need inpatient services for at least 2 midnights          B Medical/Surgery History 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 ([place])    Chronic osteomyelitis ([place])    left foot   Chronic respiratory infection    Diabetes mellitus without complication ([place])    Diabetes mellitus, type II ([place])    Diabetic foot ulcer ([place])    left foot   Fibromyalgia    GERD (gastroesophageal reflux disease)    Headache    sinus   History of blood transfusion    Hypertension    Hyponatremia    hypothyroid    Hypothyroidism    Joint pain    bilateral legs   Left hemiplegia ([place])    Sarcoidosis    Sepsis ([place])    HX   Sepsis ([place])    Uterine fibroid    hx   Uterine fibroid    Past Surgical History:  Procedure Laterality Date   ANTERIOR CERVICAL DECOMP/DISCECTOMY  FUSION N/A 10/17/2016   Procedure: ANTERIOR CERVICAL DECOMPRESSION/DISCECTOMY FUSION CERVICAL FIVE- CERVICAL SIX, CERVICAL SIX- CERVICAL SEVEN, POSSIBLE CERVICAL SIX CORPECTOMY;  Surgeon: Consuella Lose, MD;  Location: [facility];  Service: Neurosurgery;  Laterality: N/A;   ANTERIOR CERVICAL DECOMP/DISCECTOMY FUSION     APPLICATION OF WOUND VAC Left    foot   CHOLECYSTECTOMY     FOOT SURGERY Bilateral    implants   IRRIGATION AND DEBRIDEMENT FOOT Left    X2   KNEE ARTHROPLASTY     THYROIDECTOMY     UTERINE FIBROID SURGERY       A IV Location/Drains/Wounds Patient Lines/Drains/Airways Status      Active Line/Drains/Airways     Name Placement date Placement time Site Days   Peripheral IV 09/08/19 Right Hand 09/08/19  2132  Hand  592   Peripheral IV 04/22/21 22 G 1" Right Hand 04/22/21  2225  Hand  less than 1   Colostomy LLQ --  --  LLQ  --   Urethral Catheter Vida Rigger NT+3 Double-lumen;Latex 14 Fr. 09/05/19  1110  Double-lumen;Latex  595   Incision (Closed) 10/17/16 Neck 10/17/16  1223  -- 1648   Pressure Injury 01/03/18 Stage III -  Full thickness tissue loss. Subcutaneous fat may be visible but bone, tendon or muscle are NOT exposed. stage 2 ulcer noted to bilateral buttocks  01/03/18  0135  -- 1205   Pressure Injury 01/03/18 Stage III -  Full thickness tissue loss. Subcutaneous fat may be visible but bone, tendon or muscle are NOT exposed. 01/03/18  0136  -- 1205   Pressure Injury 02/10/18 Deep Tissue Injury - Purple or maroon localized area of discolored intact skin or blood-filled blister due to damage of underlying soft tissue from pressure and/or shear. 02/10/18  0800  -- 1167   Pressure Injury 02/10/18 Unstageable - Full thickness tissue loss in which the base of the ulcer is covered by slough (yellow, tan, gray, Lowell Makara or brown) and/or eschar (tan, brown or black) in the wound bed. 02/10/18  0800  -- 1167   Pressure Injury 02/10/18 Unstageable - Full thickness tissue loss in which the base of the ulcer is covered by slough (yellow, tan, gray, Winifred Balogh or brown) and/or eschar (tan, brown or black) in the wound bed. 02/10/18  0800  -- 1167   Pressure Injury 02/10/18 Unstageable - Full thickness tissue loss in which the base of the ulcer is covered by slough (yellow, tan, gray, Monifah Freehling or brown) and/or eschar (tan, brown or black) in the wound bed. this is a full thickness wound, not a pressure 02/10/18  0800  -- 1167   Wound / Incision (Open or Dehisced) 01/04/18 Non-pressure wound Thigh Left;Lateral Purple/black with blister 01/04/18  0800  Thigh  1204   Wound / Incision (Open or  Dehisced) 02/16/18 Non-pressure wound Thigh Left;Lateral full thickness wound with yellow slough to base 02/16/18  2205  Thigh  1161            Intake/Output Last 24 hours  Intake/Output Summary (Last 24 hours) at 04/22/2021 2354 Last data filed at 04/22/2021 2333 Gross per 24 hour  Intake 100 ml  Output --  Net 100 ml    Labs/Imaging Results for orders placed or performed during the hospital encounter of 04/22/21 (from the past 48 hour(s))  Protime-INR     Status: None   Collection Time: 04/22/21  9:35 PM  Result Value Ref Range   Prothrombin Time 14.6 11.4 - 15.2 seconds  INR 1.1 0.8 - 1.2    Comment: (NOTE) INR goal varies based on device and disease states. Performed at [facility], [place] [address], [place], [place] [address]   APTT     Status: None   Collection Time: 04/22/21  9:35 PM  Result Value Ref Range   aPTT 32 24 - 36 seconds    Comment: Performed at [facility], [place] [address], [place], [place] [address]  Lactic acid, plasma     Status: Abnormal   Collection Time: 04/22/21  9:36 PM  Result Value Ref Range   Lactic Acid, Venous 2.3 (HH) 0.5 - 1.9 mmol/L    Comment: CRITICAL RESULT CALLED TO, READ BACK BY AND VERIFIED WITH: LAMB T @2234  BY BATTLET Performed at [place] [address], [place], [place] [address]   Comprehensive metabolic panel     Status: Abnormal   Collection Time: 04/22/21  9:36 PM  Result Value Ref Range   Sodium 118 (LL) 135 - 145 mmol/L    Comment: CRITICAL RESULT CALLED TO, READ BACK BY AND VERIFIED WITH: LAMB T @2234  BY BATTLET    Potassium 4.6 3.5 - 5.1 mmol/L   Chloride 81 (L) 98 - 111 mmol/L   CO2 29 22 - 32 mmol/L   Glucose, Bld 227 (H) 70 - 99 mg/dL    Comment: Glucose reference range applies only to samples taken after fasting for at least 8 hours.   BUN 25 (H) 8 - 23 mg/dL   Creatinine, Ser 1.09 (H) 0.44 - 1.00 mg/dL   Calcium 8.5 (L) 8.9 - 10.3 mg/dL    Total Protein 6.9 6.5 - 8.1 g/dL   Albumin 3.0 (L) 3.5 - 5.0 g/dL   AST 19 15 - 41 U/L   ALT 17 0 - 44 U/L   Alkaline Phosphatase 59 38 - 126 U/L   Total Bilirubin 0.7 0.3 - 1.2 mg/dL   GFR, Estimated 56 (L) >60 mL/min    Comment: (NOTE) Calculated using the CKD-EPI Creatinine Equation (2021)    Anion gap 8 5 - 15    Comment: Performed at [facility], [place] [address] [place], [place] [address]  CBC with Differential/Platelet     Status: Abnormal   Collection Time: 04/22/21  9:36 PM  Result Value Ref Range   WBC 17.8 (H) 4.0 - 10.5 K/uL   RBC 3.49 (L) 3.87 - 5.11 MIL/uL   Hemoglobin 10.2 (L) 12.0 - 15.0 g/dL   HCT 30.4 (L) 36.0 - 46.0 %   MCV 87.1 80.0 - 100.0 fL   MCH 29.2 26.0 - 34.0 pg   MCHC 33.6 30.0 - 36.0 g/dL   RDW 12.6 11.5 - 15.5 %   Platelets 294 150 - 400 K/uL   nRBC 0.0 0.0 - 0.2 %   Neutrophils Relative % 71 %   Neutro Abs 12.7 (H) 1.7 - 7.7 K/uL   Lymphocytes Relative 17 %   Lymphs Abs 3.1 0.7 - 4.0 K/uL   Monocytes Relative 10 %   Monocytes Absolute 1.7 (H) 0.1 - 1.0 K/uL   Eosinophils Relative 1 %   Eosinophils Absolute 0.2 0.0 - 0.5 K/uL   Basophils Relative 0 %   Basophils Absolute 0.1 0.0 - 0.1 K/uL   Immature Granulocytes 1 %   Abs Immature Granulocytes 0.11 (H) 0.00 - 0.07 K/uL    Comment: Performed at [facility], [place] [address], [place], [place] [address]  Urinalysis,  Routine w reflex microscopic     Status: Abnormal   Collection Time: 04/22/21  9:36 PM  Result Value Ref Range   Color, Urine YELLOW YELLOW   APPearance TURBID (A) CLEAR   Specific Gravity, Urine 1.012 1.005 - 1.030   pH 5.0 5.0 - 8.0   Glucose, UA NEGATIVE NEGATIVE mg/dL   Hgb urine dipstick SMALL (A) NEGATIVE   Bilirubin Urine NEGATIVE NEGATIVE   Ketones, ur NEGATIVE NEGATIVE mg/dL   Protein, ur NEGATIVE NEGATIVE mg/dL   Nitrite NEGATIVE NEGATIVE   Leukocytes,Ua LARGE (A) NEGATIVE   RBC / HPF >50 (H) 0 - 5 RBC/hpf   WBC, UA >50 (H) 0 -  5 WBC/hpf   Bacteria, UA MANY (A) NONE SEEN   Squamous Epithelial / LPF 21-50 0 - 5   WBC Clumps PRESENT    Mucus PRESENT    Budding Yeast PRESENT    Non Squamous Epithelial 6-10 (A) NONE SEEN    Comment: Performed at [facility], [place] [address] [place], [place] [address]  Resp Panel by RT-PCR (Flu A&B, Covid)     Status: None   Collection Time: 04/22/21  9:36 PM   Specimen: Nasopharyngeal(NP) swabs in vial transport medium  Result Value Ref Range   SARS Coronavirus 2 by RT PCR NEGATIVE NEGATIVE    Comment: (NOTE) SARS-CoV-2 target nucleic acids are NOT DETECTED.  The SARS-CoV-2 RNA is generally detectable in upper respiratory specimens during the acute phase of infection. The lowest concentration of SARS-CoV-2 viral copies this assay can detect is 138 copies/mL. A negative result does not preclude SARS-Cov-2 infection and should not be used as the sole basis for treatment or other patient management decisions. A negative result may occur with  improper specimen collection/handling, submission of specimen other than nasopharyngeal swab, presence of viral mutation(s) within the areas targeted by this assay, and inadequate number of viral copies(<138 copies/mL). A negative result must be combined with clinical observations, patient history, and epidemiological information. The expected result is Negative.  Fact Sheet for Patients:  EntrepreneurPulse.com.au  Fact Sheet for Healthcare Providers:  IncredibleEmployment.be  This test is no t yet approved or cleared by the [place] FDA and  has been authorized for detection and/or diagnosis of SARS-CoV-2 by FDA under an Emergency Use Authorization (EUA). This EUA will remain  in effect (meaning this test can be used) for the duration of the COVID-19 declaration under Section 564(b)(1) of the Act, 21 U.S.C.section 360bbb-3(b)(1), unless the authorization is terminated  or  revoked sooner.       Influenza A by PCR NEGATIVE NEGATIVE   Influenza B by PCR NEGATIVE NEGATIVE    Comment: (NOTE) The Xpert Xpress SARS-CoV-2/FLU/RSV plus assay is intended as an aid in the diagnosis of influenza from Nasopharyngeal swab specimens and should not be used as a sole basis for treatment. Nasal washings and aspirates are unacceptable for Xpert Xpress SARS-CoV-2/FLU/RSV testing.  Fact Sheet for Patients: EntrepreneurPulse.com.au  Fact Sheet for Healthcare Providers: IncredibleEmployment.be  This test is not yet approved or cleared by the [place] FDA and has been authorized for detection and/or diagnosis of SARS-CoV-2 by FDA under an Emergency Use Authorization (EUA). This EUA will remain in effect (meaning this test can be used) for the duration of the COVID-19 declaration under Section 564(b)(1) of the Act, 21 U.S.C. section 360bbb-3(b)(1), unless the authorization is terminated or revoked.  Performed at [facility], [place] [address] [place], [place] [address]  CT Head Wo Contrast  Result Date: 04/22/2021 CLINICAL DATA:  Altered mental status. EXAM: CT HEAD WITHOUT CONTRAST TECHNIQUE: Contiguous axial images were obtained from the base of the skull through the vertex without intravenous contrast. COMPARISON:  January 03, 2018 FINDINGS: Brain: There is mild cerebral atrophy with widening of the extra-axial spaces and ventricular dilatation. There are areas of decreased attenuation within the white matter tracts of the supratentorial brain, consistent with microvascular disease changes. Vascular: No hyperdense vessel or unexpected calcification. Skull: Normal. Negative for fracture or focal lesion. Sinuses/Orbits: No acute finding. Other: None. IMPRESSION: 1. Generalized cerebral atrophy. 2. No acute intracranial abnormality. Electronically Signed   By: Virgina Norfolk M.D.   On: 04/22/2021 20:55   DG Chest  Port 1 View  Result Date: 04/22/2021 CLINICAL DATA:  Altered mental status. EXAM: PORTABLE CHEST 1 VIEW COMPARISON:  September 08, 2019 FINDINGS: Low lung volumes are seen. The cardiac silhouette is borderline in size. A radiopaque fusion plate and screws are seen overlying the lower cervical spine. Degenerative changes are seen involving the bilateral shoulders. IMPRESSION: Low lung volumes without evidence of acute or active cardiopulmonary disease. Electronically Signed   By: Virgina Norfolk M.D.   On: 04/22/2021 20:36    Pending Labs Unresulted Labs (From admission, onward)     Start     Ordered   04/29/21 0500  Creatinine, serum  (enoxaparin (LOVENOX)    CrCl >/= 30 ml/min)  Weekly,   R     Comments: while on enoxaparin therapy    04/22/21 2342   04/23/21 0500  Protime-INR  Tomorrow morning,   R        04/22/21 2342   04/23/21 0500  Cortisol-am, blood  Tomorrow morning,   R        04/22/21 2342   04/23/21 0500  Procalcitonin  Tomorrow morning,   R        04/22/21 2342   04/23/21 0500  CBC  Tomorrow morning,   R        04/22/21 2342   04/22/21 2341  Osmolality  Add-on,   AD        04/22/21 2342   04/22/21 2341  Osmolality, urine  Add-on,   AD        04/22/21 2342   04/22/21 2341  Sodium, urine, random  Add-on,   AD        04/22/21 2342   04/22/21 2341  TSH  Add-on,   AD        04/22/21 2342   04/22/21 1610  Basic metabolic panel  Now then every 4 hours,   R (with TIMED occurrences)      04/22/21 2342   04/22/21 2330  HIV Antibody (routine testing w rflx)  (HIV Antibody (Routine testing w reflex) panel)  Once,   R        04/22/21 2342   04/22/21 2215  Lactic acid, plasma  (Septic presentation on arrival (screening labs, nursing and treatment orders for obvious sepsis))  Now then every 2 hours,   STAT      04/22/21 2217   04/22/21 2215  Urine Culture  (Septic presentation on arrival (screening labs, nursing and treatment orders for obvious sepsis))  ONCE - STAT,   STAT        Question:  Indication  Answer:  Sepsis   04/22/21 2217   04/22/21 2023  Culture, blood (Routine X 2) w Reflex to ID Panel  BLOOD CULTURE X 2,  R (with STAT occurrences)      04/22/21 2023            Vitals/Pain Today's Vitals   04/22/21 2145 04/22/21 2200 04/22/21 2248 04/22/21 2300  BP: 103/69 (!) 124/107 (!) 109/59 128/84  Pulse:   89 86  Resp: (!) 22 16 16 20   Temp:      SpO2:   99% 99%    Isolation Precautions No active isolations  Medications Medications  metroNIDAZOLE (FLAGYL) IVPB 500 mg (500 mg Intravenous New Bag/Given 04/22/21 2349)  vancomycin (VANCOCIN) IVPB 1000 mg/200 mL premix (has no administration in time range)  pravastatin (PRAVACHOL) tablet 20 mg (has no administration in time range)  levothyroxine (SYNTHROID) tablet 168 mcg (has no administration in time range)  pantoprazole (PROTONIX) EC tablet 40 mg (has no administration in time range)  enoxaparin (LOVENOX) injection 40 mg (has no administration in time range)  sodium chloride flush (NS) 0.9 % injection 3 mL (has no administration in time range)  acetaminophen (TYLENOL) tablet 650 mg (has no administration in time range)    Or  acetaminophen (TYLENOL) suppository 650 mg (has no administration in time range)  polyethylene glycol (MIRALAX / GLYCOLAX) packet 17 g (has no administration in time range)  sodium chloride 0.9 % bolus 1,000 mL (1,000 mLs Intravenous New Bag/Given 04/22/21 2249)  ceFEPIme (MAXIPIME) 2 g in sodium chloride 0.9 % 100 mL IVPB (0 g Intravenous Stopped 04/22/21 2333)    Mobility walks with device High fall risk   Focused Assessments    R Recommendations: See Admitting Provider Note  Report given to:   Additional Notes:

## 2021-04-22 NOTE — H&P (Signed)
History and Physical   Natalie Macdonald BOF:[id number] DOB: [birth year] DOA: 04/22/2021  PCP: Pcp, No   Patient coming from: Home  Chief Complaint: Altered mental status  HPI: Natalie Macdonald is a 65 y.o. female with medical history significant of degenerative disc disease, diabetes, chronic pain, hypertension, hyperlipidemia, sarcoidosis, history of to myelitis, chronic indwelling cath, diverting colostomy, GERD, hypothyroidism, paraplegia presenting with altered mental status.  Some history obtained with assistance of chart review and family.  Patient has had waxing and waning confusion past several days per family.  Of note she has been bedbound for years secondary to paraplegia of unknown known etiology per family report.  She does have known decubitus ulcer and history osteomyelitis as well as a chronic indwelling Foley and diverting colostomy.  Due to her history infection are concerned about infection with her altered mental status.  No reported focal deficits.  Does report some darker urine in her catheter bag.  Reports chronic pain but denies any new pain.  Denies fevers, chills, chest pain, shortness of breath, abdominal pain, nausea.  States here colostomy output has been normal.  ED Course: Vital signs in ED significant for blood pressure in the 778E to 423N systolic and temperature of 100.8.  Lab work-up showed CMP with sodium 118 which corrects to 120 considering glucose of 227, chloride 81, creatinine elevated to 1.09 from baseline of 1.7.  Calcium 8.5, albumin 3.8.  CBC showed leukocytosis to 17.8 and hemoglobin of 10.2.  PT, PTT, INR within normal limits.  Initial lactic acid elevated 2.3 with repeat pending.  Respiratory panel flu COVID-negative.  Urinalysis showed leukocytes, bacteria and hemoglobin.  Urine culture blood culture pending.  Chest x-ray showed low lung volumes but otherwise no acute [place].  CT of the head showed no acute normalities.  Patient started on cefepime,  Flagyl, vancomycin in the ED.  Also I fluid bolus was ordered.  Review of Systems: As per HPI otherwise all other systems reviewed and are negative.  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 (HCC)    Chronic osteomyelitis (HCC)    left foot   Chronic respiratory infection    Diabetes mellitus without complication (HCC)    Diabetes mellitus, type II (HCC)    Diabetic foot ulcer (HCC)    left foot   Fibromyalgia    GERD (gastroesophageal reflux disease)    Headache    sinus   History of blood transfusion    Hypertension    Hyponatremia    hypothyroid    Hypothyroidism    Joint pain    bilateral legs   Left hemiplegia (HCC)    Sarcoidosis    Sepsis ([place])    HX   Sepsis ([place])    Uterine fibroid    hx   Uterine fibroid     Past Surgical History:  Procedure Laterality Date   ANTERIOR CERVICAL DECOMP/DISCECTOMY FUSION N/A 10/17/2016   Procedure: ANTERIOR CERVICAL DECOMPRESSION/DISCECTOMY FUSION CERVICAL FIVE- CERVICAL SIX, CERVICAL SIX- CERVICAL SEVEN, POSSIBLE CERVICAL SIX CORPECTOMY;  Surgeon: Consuella Lose, MD;  Location: [facility];  Service: Neurosurgery;  Laterality: N/A;   ANTERIOR CERVICAL DECOMP/DISCECTOMY FUSION     APPLICATION OF WOUND VAC Left    foot   CHOLECYSTECTOMY     FOOT  SURGERY Bilateral    implants   IRRIGATION AND DEBRIDEMENT FOOT Left    X2   KNEE ARTHROPLASTY     THYROIDECTOMY     UTERINE FIBROID SURGERY      Social History  reports that she has never smoked. She has never used smokeless tobacco. She reports that she does not drink alcohol and does not use drugs.  Allergies  Allergen Reactions   Grapefruit Bioflavonoid Complex Hives, Swelling and Other (See Comments)    Caused her to get hives, swelling of the eye, throat    Latex Hives   Procaine  Anaphylaxis   Procaine Anaphylaxis   Grapefruit Flavor [Flavoring Agent]    Novolog [Insulin Aspart]     Okay to use Regular insulin (patient doesn't tolerate Novolog)     Penicillins Hives, Itching and Swelling    Tolerates Keflex, Augmentin (2021)    Family History  Problem Relation Age of Onset   Throat cancer Mother   Reviewed on admission  Prior to Admission medications   Medication Sig Start Date End Date Taking? Authorizing Provider  amitriptyline (ELAVIL) 100 MG tablet Take 100 mg by mouth at bedtime.     [provider]  amLODipine-benazepril (LOTREL) 5-40 MG capsule Take 1 capsule by mouth daily.    [provider]  APIDRA 100 UNIT/ML injection Inject 18-25 Units into the skin 3 (three) times daily with meals. Sliding Scale 12/13/17   [provider]  baclofen (LIORESAL) 10 MG tablet Take 10 mg by mouth 3 (three) times daily as needed for muscle spasms.  10/03/16   [provider]  Calcium Carbonate (CALCIUM 600 PO) Take 600 mg by mouth daily.    [provider]  carvedilol (COREG) 25 MG tablet Take 25 mg by mouth 2 (two) times daily with a meal.    [provider]  cetirizine (ZYRTEC) 10 MG tablet Take 10 mg by mouth daily as needed for allergies.     [provider]  chlorthalidone (HYGROTON) 25 MG tablet Take 25 mg by mouth daily.    [provider]  CRANBERRY PO Take 2 capsules by mouth daily.    [provider]  diclofenac (VOLTAREN) 75 MG EC tablet Take 75 mg by mouth 2 (two) times daily. 12/05/17   [provider]  DULoxetine (CYMBALTA) 30 MG capsule Take 60 mg by mouth daily. 11/25/17   [provider]  fluticasone (FLONASE) 50 MCG/ACT nasal spray Place 2 sprays into both nostrils daily as needed for allergies.     [provider]  gabapentin (NEURONTIN) 300 MG capsule Take 300 mg by mouth 3 (three) times daily as needed (for pain).     [provider]   hydrOXYzine (ATARAX/VISTARIL) 10 MG tablet Take 10 mg by mouth every 6 (six) hours as needed for itching or anxiety.  08/29/19   [provider]  levothyroxine (SYNTHROID) 112 MCG tablet Take 1.5 tablets (168 mcg total) by mouth daily before breakfast. 09/09/19   Mariel Aloe, MD  Multiple Vitamin (MULTIVITAMIN) tablet Take 1 tablet by mouth daily.    [provider]  Oxycodone HCl 10 MG TABS Take 20 mg by mouth every 4 (four) hours as needed (for pain).  10/04/16   [provider]  Oxycodone HCl 20 MG TABS Take 1 tablet by mouth every 4 (four) hours. 09/01/19   [provider]  pantoprazole (PROTONIX) 40 MG tablet Take 40 mg by mouth daily.    [provider]  pravastatin (PRAVACHOL) 20 MG tablet Take 20 mg by mouth daily.    [provider]  Probiotic Product (PROBIOTIC PO) Take 2 capsules by mouth daily.    [provider]  Pyridoxine HCl (VITAMIN B-6) 250 MG tablet Take 250 mg by mouth daily.    [provider]  ranitidine (ZANTAC) 75 MG tablet Take 150 mg by mouth daily as needed for heartburn.    [provider]  silver sulfADIAZINE (SILVADENE) 1 % cream Apply 1 application topically 2 (two) times daily.  01/15/18   [provider]  TOUJEO SOLOSTAR 300 UNIT/ML SOPN Inject 40-50 Units into the skin at bedtime. Sliding Scale 10/30/17   [provider]  vitamin B-12 (CYANOCOBALAMIN) 1000 MCG tablet Take 1,000 mcg by mouth daily.    [provider]  vitamin E 400 UNIT capsule Take 400 Units by mouth daily.    [provider]    Physical Exam: Vitals:   04/22/21 2200 04/22/21 2248 04/22/21 2300 04/22/21 2315  BP: (!) 124/107 (!) 109/59 128/84 137/90  Pulse:  89 86 86  Resp: 16 16 20    Temp:    98.9 F (37.2 C)  TempSrc:    Oral  SpO2:  99% 99% 100%   Physical Exam Constitutional:      General: She is not in acute distress.    Appearance: Normal appearance. She is obese.      Comments: Chronically ill-appearing female  HENT:     Head: Normocephalic and atraumatic.     Mouth/Throat:     Mouth: Mucous membranes are dry.     Pharynx: Oropharynx is clear.  Eyes:     Extraocular Movements: Extraocular movements intact.     Pupils: Pupils are equal, round, and reactive to light.  Cardiovascular:     Rate and Rhythm: Normal rate and regular rhythm.     Pulses: Normal pulses.     Heart sounds: Normal heart sounds.  Pulmonary:     Effort: Pulmonary effort is normal. No respiratory distress.     Breath sounds: Normal breath sounds.  Abdominal:     General: Bowel sounds are normal. There is no distension.     Palpations: Abdomen is soft.     Tenderness: There is no abdominal tenderness.     Comments: Colostomy in place  Musculoskeletal:        General: No swelling or deformity.     Comments: Chronic paraplegia.  Lower extremities with Prevalon boots.  Skin:    General: Skin is warm and dry.  Neurological:     General: No focal deficit present.     Mental Status: She is oriented to person, place, and time. Mental status is at baseline.   Labs on Admission: I have personally reviewed following labs and imaging studies  CBC: Recent Labs  Lab 04/22/21 2136  WBC 17.8*  NEUTROABS 12.7*  HGB 10.2*  HCT 30.4*  MCV 87.1  PLT 161    Basic Metabolic Panel: Recent Labs  Lab 04/22/21 2136  NA 118*  K 4.6  CL 81*  CO2 29  GLUCOSE 227*  BUN 25*  CREATININE 1.09*  CALCIUM 8.5*    GFR: CrCl cannot be calculated (Unknown ideal weight.).  Liver Function Tests: Recent Labs  Lab 04/22/21 2136  AST 19  ALT 17  ALKPHOS 59  BILITOT 0.7  PROT 6.9  ALBUMIN 3.0*    Urine analysis:    Component Value Date/Time   COLORURINE YELLOW 04/22/2021  2136   APPEARANCEUR TURBID (A) 04/22/2021 2136   LABSPEC 1.012 04/22/2021 2136   PHURINE 5.0 04/22/2021 2136   GLUCOSEU NEGATIVE 04/22/2021 2136   HGBUR SMALL (A) 04/22/2021 2136   BILIRUBINUR NEGATIVE  04/22/2021 2136   KETONESUR NEGATIVE 04/22/2021 2136   PROTEINUR NEGATIVE 04/22/2021 2136   NITRITE NEGATIVE 04/22/2021 2136   LEUKOCYTESUR LARGE (A) 04/22/2021 2136    Radiological Exams on Admission: CT Head Wo Contrast  Result Date: 04/22/2021 CLINICAL DATA:  Altered mental status. EXAM: CT HEAD WITHOUT CONTRAST TECHNIQUE: Contiguous axial images were obtained from the base of the skull through the vertex without intravenous contrast. COMPARISON:  January 03, 2018 FINDINGS: Brain: There is mild cerebral atrophy with widening of the extra-axial spaces and ventricular dilatation. There are areas of decreased attenuation within the white matter tracts of the supratentorial brain, consistent with microvascular disease changes. Vascular: No hyperdense vessel or unexpected calcification. Skull: Normal. Negative for fracture or focal lesion. Sinuses/Orbits: No acute finding. Other: None. IMPRESSION: 1. Generalized cerebral atrophy. 2. No acute intracranial abnormality. Electronically Signed   By: Virgina Norfolk M.D.   On: 04/22/2021 20:55   DG Chest Port 1 View  Result Date: 04/22/2021 CLINICAL DATA:  Altered mental status. EXAM: PORTABLE CHEST 1 VIEW COMPARISON:  September 08, 2019 FINDINGS: Low lung volumes are seen. The cardiac silhouette is borderline in size. A radiopaque fusion plate and screws are seen overlying the lower cervical spine. Degenerative changes are seen involving the bilateral shoulders. IMPRESSION: Low lung volumes without evidence of acute or active cardiopulmonary disease. Electronically Signed   By: Virgina Norfolk M.D.   On: 04/22/2021 20:36    EKG: Independently reviewed.  Sinus rhythm at 86 bpm.  New right bundle branch block.  Assessment/Plan Principal Problem:   Sepsis ([place]) Active Problems:   Chronic left-sided low back pain with left-sided sciatica   Diabetes mellitus type 2 with complications (HCC)   Hyperlipidemia, unspecified   Hypertension   Hyponatremia    Left hemiplegia (HCC)   Pressure injury of skin   AKI (acute kidney injury) ([place])   Acute encephalopathy   Hypothyroidism  Sepsis Toxic encephalopathy > Patient presenting with temperature of 100.8, leukocytosis to 17.8, altered mental status, AKI with concern for possible decubitus (Chronic decubitus ulcer and history of osteo) versus urinary source (chronic foley, with dirty U/A). LA 2.3 on first check. > Started on vancomycin, cefepime, Flagyl in the ED.  Significant IV bolus was ordered but this has been discontinued as her blood pressure has remained normotensive and she has significant hyponatremia as below. > Encephalopathy has been waxing and waning, currently improved on my exam, believed to be secondary to this sepsis however will hold centrally acting medications overnight. - Monitor in progressive unit - Continue vancomycin, cefepime, Flagyl - Follow-up urine cultures and blood cultures - Trend fever curve and white count - Monitor blood pressure closely can give additional boluses if needed however will hold off for now considering hyponatremia below. - Given waxing and waning altered mental status currently holding centrally acting medications including amitriptyline, duloxetine, hydroxyzine, baclofen, gabapentin - WOC consult for decubitus ulcer   Hyponatremia > Noted to have sodium of 118, corrected to 120 for glucose 220. Moderate hyponatremia > Significant 30 cc.  KG fluid bolus ordered in the ED per sepsis however most of this have not yet been given. (Confirmed just 1 L given so far.) We will hold off for now considering significant hyponatremia as we monitor response to initial  fluids have been given.  Does have AKI may be somewhat hypovolemic.  Also is taking chlorthalidone.   - Monitor in progressive unit - Check BMP now and every 4 hours - Adjust rate of IV fluids based on results of repeat BMP - Check serum osmolality, urine osmolality, urine sodium - TSH, has  cortisol ordered already for sepsis knee (which may affect results)  AKI > Creatinine elevated to 1.09 from baseline around 0.7. -Avoid nephrotoxins -Trend renal function and electrolytes - Has received IV fluids in the ED but rate will be determined based on sodium response initial IV fluid bolus.  Diabetes > On 40 units long-acting in the evening and 20 units with meals. > Has intolerances listed to multiple insulins.  Does not tolerate Lantus and regular insulin per chart review. - 30 units Lantus nightly - 10 units qAC and SSI (nonformulary request entered for regular insulin.)  Chronic pain - We will proceed with chronic pain medication for now as her mentation has improved. -Continue home oxycodone  Hypothyroidism - Continue home Synthroid  Hyperlipidemia - Continue home statin  Hypertension > Given low normal blood pressure and sepsis we will currently hold antihypertensive. - Hold home amlodipine-benazepril, carvedilol, chlorthalidone  DVT prophylaxis: Lovenox  Code Status:   Full Family Communication:  Educated to contact daughter, Mykia, with update however went straight to voicemail. Disposition Plan:   Patient is from:  Home  Anticipated DC to:  Pending clinical course  Anticipated DC date:  2 to 7 days  Anticipated DC barriers: None  Consults called:  None  Admission status:  Inpatient, progressive   Severity of Illness: The appropriate patient status for this patient is INPATIENT. Inpatient status is judged to be reasonable and necessary in order to provide the required intensity of service to ensure the patient's safety. The patient's presenting symptoms, physical exam findings, and initial radiographic and laboratory data in the context of their chronic comorbidities is felt to place them at high risk for further clinical deterioration. Furthermore, it is not anticipated that the patient will be medically stable for discharge from the hospital within 2  midnights of admission.   * I certify that at the point of admission it is my clinical judgment that the patient will require inpatient hospital care spanning beyond 2 midnights from the point of admission due to high intensity of service, high risk for further deterioration and high frequency of surveillance required.Marcelyn Bruins MD [facility]  How to contact the [facility] Attending or Consulting provider [place] or covering provider during after hours [place], for this patient?   Check the care team in [facility] and look for a) attending/consulting [facility] provider listed and b) the [facility] team listed Log into [email or web address] and use [place]'s universal password to access. If you do not have the password, please contact the hospital operator. Locate the [facility] provider you are looking for under [facility] and page to a number that you can be directly reached. If you still have difficulty reaching the provider, please page the [facility] (Director on Call) for the Hospitalists listed on amion for assistance.  04/23/2021, 12:18 AM

## 2021-04-22 NOTE — Sepsis Progress Note (Signed)
Monitoring for code sepsis protocol. 

## 2021-04-22 NOTE — ED Provider Notes (Addendum)
[facility] DEPT Provider Note   CSN: [id number] Arrival date & time: 04/22/21  1904     History Chief Complaint  Patient presents with   Altered Mental Status    Natalie Macdonald is a 65 y.o. female.  Patient brought in by EMS.  Patient has been bedridden for a number of years.  Patient developed paraplegia for unknown reasons they could never figure it out.  Underwent a special operation which did give her regain of function according to family.  But then she developed infections.  Patient is bedridden.  Is known to have decubiti.  They are baseline.  But possible source of infection.  Patient also has a long indwelling Foley catheter.  Another possible source of infection.  Patient arrived here with a temp of 100.8.  The family stated that she has had some waxing and waning confusion for the past several days.  They were concerned that she had some kind of infection somewhere.  Patient has a diverting colostomy and that has been working fine.  Family did mention there was some concern that maybe she had had a stroke.  But there is no any new focal deficits.      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 (HCC)    Chronic osteomyelitis ([place])    left foot   Chronic respiratory infection    Diabetes mellitus without complication (HCC)    Diabetes mellitus, type II (HCC)    Diabetic foot ulcer (HCC)    left foot   Fibromyalgia    GERD (gastroesophageal reflux disease)    Headache    sinus   History of blood transfusion    Hypertension    Hyponatremia    hypothyroid    Hypothyroidism    Joint pain    bilateral legs   Left hemiplegia (HCC)    Sarcoidosis    Sepsis ([place])    HX   Sepsis ([place])    Uterine fibroid    hx   Uterine fibroid     Patient Active  Problem List   Diagnosis Date Noted   Positive blood culture 09/09/2019   Yeast cystitis 09/09/2019   Diarrhea 01/06/2018   Sepsis ([place]) 01/03/2018   Cellulitis 01/03/2018   Sacral decubitus ulcer, stage III ([place]) 01/03/2018   AKI (acute kidney injury) ([place]) 01/03/2018   Dehydration 01/03/2018   Toxic encephalopathy 01/03/2018   Adverse drug effect 01/03/2018   Acute encephalopathy    Hypoxemia    Palliative care encounter    Septic shock (HCC)    Acute respiratory failure with hypoxemia (HCC)    Pressure injury of skin 10/18/2016   Cervical spondylosis with myelopathy 10/17/2016   Chronic left-sided low back pain with left-sided sciatica 09/05/2016   Cervicalgia 09/05/2016   Diabetes mellitus type 2 with complications ([place]) 15/17/6160   Hyperlipidemia, unspecified 09/05/2016   Hypertension 09/05/2016   Sarcoidosis 09/05/2016   Cervical disc disorder with myelopathy of mid-cervical region 03/13/2016   Left hemiplegia ([place]) 02/07/2016   Chronic osteomyelitis of ankle and foot, left ([place]) 09/25/2014   Hyponatremia 09/25/2014   Diabetic ulcer of left foot associated with type 2 diabetes mellitus ([place]) 08/30/2014   Charcot's joint of foot due to diabetes ([place]) 10/15/2013  Pain in joint involving ankle and foot 10/15/2013    Past Surgical History:  Procedure Laterality Date   ANTERIOR CERVICAL DECOMP/DISCECTOMY FUSION N/A 10/17/2016   Procedure: ANTERIOR CERVICAL DECOMPRESSION/DISCECTOMY FUSION CERVICAL FIVE- CERVICAL SIX, CERVICAL SIX- CERVICAL SEVEN, POSSIBLE CERVICAL SIX CORPECTOMY;  Surgeon: Consuella Lose, MD;  Location: [facility];  Service: Neurosurgery;  Laterality: N/A;   ANTERIOR CERVICAL DECOMP/DISCECTOMY FUSION     APPLICATION OF WOUND VAC Left    foot   CHOLECYSTECTOMY     FOOT SURGERY Bilateral    implants   IRRIGATION AND DEBRIDEMENT FOOT Left    X2   KNEE ARTHROPLASTY     THYROIDECTOMY     UTERINE FIBROID SURGERY       OB History   No obstetric history on  file.     Family History  Problem Relation Age of Onset   Throat cancer Mother     Social History   Tobacco Use   Smoking status: Never   Smokeless tobacco: Never  Substance Use Topics   Alcohol use: No   Drug use: No    Home Medications Prior to Admission medications   Medication Sig Start Date End Date Taking? Authorizing Provider  amitriptyline (ELAVIL) 100 MG tablet Take 100 mg by mouth at bedtime.     [provider]  amLODipine-benazepril (LOTREL) 5-40 MG capsule Take 1 capsule by mouth daily.    [provider]  APIDRA 100 UNIT/ML injection Inject 18-25 Units into the skin 3 (three) times daily with meals. Sliding Scale 12/13/17   [provider]  baclofen (LIORESAL) 10 MG tablet Take 10 mg by mouth 3 (three) times daily as needed for muscle spasms.  10/03/16   [provider]  Calcium Carbonate (CALCIUM 600 PO) Take 600 mg by mouth daily.    [provider]  carvedilol (COREG) 25 MG tablet Take 25 mg by mouth 2 (two) times daily with a meal.    [provider]  cetirizine (ZYRTEC) 10 MG tablet Take 10 mg by mouth daily as needed for allergies.     [provider]  chlorthalidone (HYGROTON) 25 MG tablet Take 25 mg by mouth daily.    [provider]  CRANBERRY PO Take 2 capsules by mouth daily.    [provider]  diclofenac (VOLTAREN) 75 MG EC tablet Take 75 mg by mouth 2 (two) times daily. 12/05/17   [provider]  DULoxetine (CYMBALTA) 30 MG capsule Take 60 mg by mouth daily. 11/25/17   [provider]  fluticasone (FLONASE) 50 MCG/ACT nasal spray Place 2 sprays into both nostrils daily as needed for allergies.     [provider]  gabapentin (NEURONTIN) 300 MG capsule Take 300 mg by mouth 3 (three) times daily as needed (for pain).     [provider]  hydrOXYzine (ATARAX/VISTARIL) 10 MG tablet Take 10 mg by mouth every 6 (six) hours as needed for itching or  anxiety.  08/29/19   [provider]  levothyroxine (SYNTHROID) 112 MCG tablet Take 1.5 tablets (168 mcg total) by mouth daily before breakfast. 09/09/19   Mariel Aloe, MD  Multiple Vitamin (MULTIVITAMIN) tablet Take 1 tablet by mouth daily.    [provider]  Oxycodone HCl 10 MG TABS Take 20 mg by mouth every 4 (four) hours as needed (for pain).  10/04/16   [provider]  Oxycodone HCl 20 MG TABS Take 1 tablet by mouth every 4 (four) hours. 09/01/19  [provider]  pantoprazole (PROTONIX) 40 MG tablet Take 40 mg by mouth daily.    [provider]  pravastatin (PRAVACHOL) 20 MG tablet Take 20 mg by mouth daily.    [provider]  Probiotic Product (PROBIOTIC PO) Take 2 capsules by mouth daily.    [provider]  Pyridoxine HCl (VITAMIN B-6) 250 MG tablet Take 250 mg by mouth daily.    [provider]  ranitidine (ZANTAC) 75 MG tablet Take 150 mg by mouth daily as needed for heartburn.    [provider]  silver sulfADIAZINE (SILVADENE) 1 % cream Apply 1 application topically 2 (two) times daily.  01/15/18   [provider]  TOUJEO SOLOSTAR 300 UNIT/ML SOPN Inject 40-50 Units into the skin at bedtime. Sliding Scale 10/30/17   [provider]  vitamin B-12 (CYANOCOBALAMIN) 1000 MCG tablet Take 1,000 mcg by mouth daily.    [provider]  vitamin E 400 UNIT capsule Take 400 Units by mouth daily.    [provider]    Allergies    Grapefruit bioflavonoid complex, Latex, Procaine, Procaine, Grapefruit flavor [flavoring agent], Novolog [insulin aspart], and Penicillins  Review of Systems   Review of Systems  Constitutional:  Positive for fever. Negative for chills.  HENT:  Negative for ear pain and sore throat.   Eyes:  Negative for pain and visual disturbance.  Respiratory:  Negative for cough and shortness of breath.   Cardiovascular:  Negative for chest pain and palpitations.   Gastrointestinal:  Negative for abdominal pain and vomiting.  Genitourinary:  Negative for dysuria and hematuria.  Musculoskeletal:  Negative for arthralgias and back pain.  Skin:  Positive for wound. Negative for color change and rash.  Neurological:  Negative for seizures and syncope.  Psychiatric/Behavioral:  Positive for confusion.   All other systems reviewed and are negative.  Physical Exam Updated Vital Signs BP (!) 124/107   Pulse 87   Temp (!) 100.8 F (38.2 C)   Resp 16   LMP  (LMP Unknown)   SpO2 96%   Physical Exam Vitals and nursing note reviewed.  Constitutional:      General: She is not in acute distress.    Appearance: Normal appearance. She is well-developed. She is ill-appearing.  HENT:     Head: Normocephalic and atraumatic.     Mouth/Throat:     Mouth: Mucous membranes are dry.  Eyes:     Extraocular Movements: Extraocular movements intact.     Conjunctiva/sclera: Conjunctivae normal.     Pupils: Pupils are equal, round, and reactive to light.  Cardiovascular:     Rate and Rhythm: Normal rate and regular rhythm.     Heart sounds: No murmur heard. Pulmonary:     Effort: Pulmonary effort is normal. No respiratory distress.     Breath sounds: Normal breath sounds.  Abdominal:     General: There is no distension.     Palpations: Abdomen is soft.     Tenderness: There is no abdominal tenderness.     Comments: Colostomy left lower quadrant.  Stool is brown.  Genitourinary:    Comments: Patient has a longstanding indwelling Foley catheter.  There is some purulence around the catheter Musculoskeletal:        General: Swelling present.     Cervical back: Neck supple.  Skin:    General: Skin is warm and dry.     Comments: Sacral decubiti.  Neurological:     Mental Status: She  is alert. Mental status is at baseline.     Comments: Patient able to move upper extremities fine.    ED Results / Procedures / Treatments   Labs (all labs ordered are listed,  but only abnormal results are displayed) Labs Reviewed  LACTIC ACID, PLASMA - Abnormal; Notable for the following components:      Result Value   Lactic Acid, Venous 2.3 (*)    All other components within normal limits  COMPREHENSIVE METABOLIC PANEL - Abnormal; Notable for the following components:   Sodium 118 (*)    Chloride 81 (*)    Glucose, Bld 227 (*)    BUN 25 (*)    Creatinine, Ser 1.09 (*)    Calcium 8.5 (*)    Albumin 3.0 (*)    GFR, Estimated 56 (*)    All other components within normal limits  CBC WITH DIFFERENTIAL/PLATELET - Abnormal; Notable for the following components:   WBC 17.8 (*)    RBC 3.49 (*)    Hemoglobin 10.2 (*)    HCT 30.4 (*)    Neutro Abs 12.7 (*)    Monocytes Absolute 1.7 (*)    Abs Immature Granulocytes 0.11 (*)    All other components within normal limits  URINALYSIS, ROUTINE W REFLEX MICROSCOPIC - Abnormal; Notable for the following components:   APPearance TURBID (*)    Hgb urine dipstick SMALL (*)    Leukocytes,Ua LARGE (*)    RBC / HPF >50 (*)    WBC, UA >50 (*)    Bacteria, UA MANY (*)    Non Squamous Epithelial 6-10 (*)    All other components within normal limits  RESP PANEL BY RT-PCR (FLU A&B, COVID) ARPGX2  CULTURE, BLOOD (ROUTINE X 2)  CULTURE, BLOOD (ROUTINE X 2)  URINE CULTURE  PROTIME-INR  APTT  LACTIC ACID, PLASMA  LACTIC ACID, PLASMA    EKG None ED ECG REPORT   Date: 04/22/2021  Rate: 86  Rhythm: normal sinus rhythm  QRS Axis: normal  Intervals: normal  ST/T Wave abnormalities: nonspecific ST/T changes  Conduction Disutrbances:right bundle branch block  Narrative Interpretation:   Old EKG Reviewed: changes noted  I have personally reviewed the EKG tracing and agree with the computerized printout as noted.   Radiology CT Head Wo Contrast  Result Date: 04/22/2021 CLINICAL DATA:  Altered mental status. EXAM: CT HEAD WITHOUT CONTRAST TECHNIQUE: Contiguous axial images were obtained from the base of the skull  through the vertex without intravenous contrast. COMPARISON:  January 03, 2018 FINDINGS: Brain: There is mild cerebral atrophy with widening of the extra-axial spaces and ventricular dilatation. There are areas of decreased attenuation within the white matter tracts of the supratentorial brain, consistent with microvascular disease changes. Vascular: No hyperdense vessel or unexpected calcification. Skull: Normal. Negative for fracture or focal lesion. Sinuses/Orbits: No acute finding. Other: None. IMPRESSION: 1. Generalized cerebral atrophy. 2. No acute intracranial abnormality. Electronically Signed   By: Virgina Norfolk M.D.   On: 04/22/2021 20:55   DG Chest Port 1 View  Result Date: 04/22/2021 CLINICAL DATA:  Altered mental status. EXAM: PORTABLE CHEST 1 VIEW COMPARISON:  September 08, 2019 FINDINGS: Low lung volumes are seen. The cardiac silhouette is borderline in size. A radiopaque fusion plate and screws are seen overlying the lower cervical spine. Degenerative changes are seen involving the bilateral shoulders. IMPRESSION: Low lung volumes without evidence of acute or active cardiopulmonary disease. Electronically Signed   By: Virgina Norfolk M.D.   On: 04/22/2021  20:36    Procedures Procedures   Medications Ordered in ED Medications  0.9 %  sodium chloride infusion (has no administration in time range)  lactated ringers infusion (has no administration in time range)  lactated ringers bolus 1,000 mL (has no administration in time range)    And  lactated ringers bolus 1,000 mL (has no administration in time range)    And  lactated ringers bolus 1,000 mL (has no administration in time range)    And  lactated ringers bolus 500 mL (has no administration in time range)  ceFEPIme (MAXIPIME) 2 g in sodium chloride 0.9 % 100 mL IVPB (2 g Intravenous New Bag/Given 04/22/21 2248)  metroNIDAZOLE (FLAGYL) IVPB 500 mg (has no administration in time range)  vancomycin (VANCOCIN) IVPB 1000 mg/200 mL  premix (has no administration in time range)  sodium chloride 0.9 % bolus 1,000 mL (1,000 mLs Intravenous New Bag/Given 04/22/21 2249)    ED Course  I have reviewed the triage vital signs and the nursing notes.  Pertinent labs & imaging results that were available during my care of the patient were reviewed by me and considered in my medical decision making (see chart for details).    MDM Rules/Calculators/A&P                         CRITICAL CARE Performed by: Fredia Sorrow Total critical care time: 60 minutes Critical care time was exclusive of separately billable procedures and treating other patients. Critical care was necessary to treat or prevent imminent or life-threatening deterioration. Critical care was time spent personally by me on the following activities: development of treatment plan with patient and/or surrogate as well as nursing, discussions with consultants, evaluation of patient's response to treatment, examination of patient, obtaining history from patient or surrogate, ordering and performing treatments and interventions, ordering and review of laboratory studies, ordering and review of radiographic studies, pulse oximetry and re-evaluation of patient's condition.   Patient based on vital signs and not immediately meet sepsis criteria.  Room air sats were 96%.  Blood sugar was 130.  Temp was 100.8.  Heart rate was 85 respirations 17.  Blood pressure 116/69.  However patient's lactic acid was elevated at 2.3.  And but more of concern white blood cell count was 17.8.  So this raised increased concern for sepsis.  For patient started on broad-spectrum antibiotics.  And 30 cc/kg fluid challenge.  Patient's sodium was significantly low at 118.  Chloride was low as well.  BUN 25 creatinine 1.09 GFR 56.  Patient had a white count of 17,000 hemoglobin 10.2 no anion gap.  COVID and flu panel was negative.  INR is 1.1.  CT head had no acute changes.  Chest x-ray had no evidence  of any acute cardiopulmonary disease.  Patient's urinalysis was significantly abnormal.  Turbid large amount of leukocytes.  RBCs were greater than 50 white blood cells greater than 50 lots of bacteria.  This could be a possible source of the infection.  The other source could be the decubiti.  Patient requires admission for concerns for sepsis.  As well as for the hyponatremia.  Patient's vital signs no hypotension.  Patient could be admitted to hospitalist service.  Patient is EKG had a mismatch.  But shows sinus rhythm right bundle branch block.  Old EKGs do not show right bundle blanch block  Final Clinical Impression(s) / ED Diagnoses Final diagnoses:  Sepsis, due to unspecified organism, unspecified whether  acute organ dysfunction present ([place])  Hyponatremia  Acute cystitis without hematuria    Rx / DC Orders ED Discharge Orders     None        Fredia Sorrow, MD 04/22/21 [id number]    Fredia Sorrow, MD 04/22/21 2307

## 2021-04-23 DIAGNOSIS — A419 Sepsis, unspecified organism: Secondary | ICD-10-CM | POA: Diagnosis not present

## 2021-04-23 DIAGNOSIS — M5442 Lumbago with sciatica, left side: Secondary | ICD-10-CM | POA: Diagnosis not present

## 2021-04-23 DIAGNOSIS — G9340 Encephalopathy, unspecified: Secondary | ICD-10-CM | POA: Diagnosis not present

## 2021-04-23 DIAGNOSIS — N179 Acute kidney failure, unspecified: Secondary | ICD-10-CM | POA: Diagnosis not present

## 2021-04-23 LAB — BASIC METABOLIC PANEL
Anion gap: 10 (ref 5–15)
Anion gap: 9 (ref 5–15)
Anion gap: 8 (ref 5–15)
BUN: 24 mg/dL — ABNORMAL HIGH (ref 8–23)
BUN: 23 mg/dL (ref 8–23)
BUN: 26 mg/dL — ABNORMAL HIGH (ref 8–23)
CO2: 27 mmol/L (ref 22–32)
CO2: 29 mmol/L (ref 22–32)
CO2: 29 mmol/L (ref 22–32)
Calcium: 7.9 mg/dL — ABNORMAL LOW (ref 8.9–10.3)
Calcium: 8.4 mg/dL — ABNORMAL LOW (ref 8.9–10.3)
Calcium: 8.2 mg/dL — ABNORMAL LOW (ref 8.9–10.3)
Chloride: 82 mmol/L — ABNORMAL LOW (ref 98–111)
Chloride: 82 mmol/L — ABNORMAL LOW (ref 98–111)
Chloride: 84 mmol/L — ABNORMAL LOW (ref 98–111)
Creatinine, Ser: 0.63 mg/dL (ref 0.44–1.00)
Creatinine, Ser: 0.91 mg/dL (ref 0.44–1.00)
Creatinine, Ser: 0.99 mg/dL (ref 0.44–1.00)
GFR, Estimated: >60 mL/min (ref >60)
GFR, Estimated: >60 mL/min (ref >60)
GFR, Estimated: >60 mL/min (ref >60)
Glucose, Bld: 229 mg/dL — ABNORMAL HIGH (ref 70–99)
Glucose, Bld: 230 mg/dL — ABNORMAL HIGH (ref 70–99)
Glucose, Bld: 202 mg/dL — ABNORMAL HIGH (ref 70–99)
Potassium: 4.4 mmol/L (ref 3.5–5.1)
Potassium: 4.4 mmol/L (ref 3.5–5.1)
Potassium: 4.1 mmol/L (ref 3.5–5.1)
Sodium: 119 mmol/L — CL (ref 135–145)
Sodium: 120 mmol/L — ABNORMAL LOW (ref 135–145)
Sodium: 121 mmol/L — ABNORMAL LOW (ref 135–145)

## 2021-04-23 LAB — GLUCOSE, CAPILLARY
Glucose-Capillary: 196 mg/dL — ABNORMAL HIGH (ref 70–99)
Glucose-Capillary: 190 mg/dL — ABNORMAL HIGH (ref 70–99)
Glucose-Capillary: 203 mg/dL — ABNORMAL HIGH (ref 70–99)
Glucose-Capillary: 278 mg/dL — ABNORMAL HIGH (ref 70–99)

## 2021-04-23 LAB — CBC
HCT: 28.4 % — ABNORMAL LOW (ref 36.0–46.0)
Hemoglobin: 9.4 g/dL — ABNORMAL LOW (ref 12.0–15.0)
MCH: 28.8 pg (ref 26.0–34.0)
MCHC: 33.1 g/dL (ref 30.0–36.0)
MCV: 87.1 fL (ref 80.0–100.0)
Platelets: 317 10*3/uL (ref 150–400)
RBC: 3.26 MIL/uL — ABNORMAL LOW (ref 3.87–5.11)
RDW: 12.5 % (ref 11.5–15.5)
WBC: 17.6 10*3/uL — ABNORMAL HIGH (ref 4.0–10.5)
nRBC: 0 % (ref 0.0–0.2)

## 2021-04-23 LAB — HIV ANTIBODY (ROUTINE TESTING W REFLEX): HIV Screen 4th Generation wRfx: NONREACTIVE

## 2021-04-23 LAB — LACTIC ACID, PLASMA
Lactic Acid, Venous: 1.4 mmol/L (ref 0.5–1.9)
Lactic Acid, Venous: 1.1 mmol/L (ref 0.5–1.9)

## 2021-04-23 LAB — SODIUM, URINE, RANDOM: Sodium, Ur: 25 mmol/L

## 2021-04-23 LAB — TSH: TSH: 11.201 u[IU]/mL — ABNORMAL HIGH (ref 0.350–4.500)

## 2021-04-23 LAB — PROTIME-INR
INR: 1.2 (ref 0.8–1.2)
Prothrombin Time: 15 seconds (ref 11.4–15.2)

## 2021-04-23 LAB — OSMOLALITY: Osmolality: 266 mOsm/kg — ABNORMAL LOW (ref 275–295)

## 2021-04-23 LAB — CORTISOL-AM, BLOOD: Cortisol - AM: 9.2 ug/dL (ref 6.7–22.6)

## 2021-04-23 LAB — PROCALCITONIN: Procalcitonin: <0.1 ng/mL

## 2021-04-23 MED ORDER — OXYCODONE HCL 5 MG PO TABS
20.0000 mg | ORAL_TABLET | ORAL | Status: DC | PRN
  Administered 2021-04-23 – 2021-04-26 (×16): 20 mg via ORAL
  Filled 2021-04-23 (×16): qty 4

## 2021-04-23 MED ORDER — METRONIDAZOLE 500 MG/100ML IV SOLN
500.0000 mg | Freq: Two times a day (BID) | INTRAVENOUS | Status: DC
  Administered 2021-04-24 – 2021-04-25 (×3): 500 mg via INTRAVENOUS
  Filled 2021-04-23 (×3): qty 100

## 2021-04-23 MED ORDER — INSULIN ASPART 100 UNIT/ML IJ SOLN
0.0000 [IU] | Freq: Every day | INTRAMUSCULAR | Status: DC
  Administered 2021-04-23: 3 [IU] via SUBCUTANEOUS
  Administered 2021-04-24: 2 [IU] via SUBCUTANEOUS

## 2021-04-23 MED ORDER — INSULIN ASPART 100 UNIT/ML IJ SOLN: 20.0000 [IU] | Freq: Three times a day (TID) | INTRAMUSCULAR | Status: DC

## 2021-04-23 MED ORDER — SODIUM CHLORIDE 0.9 % IV SOLN
2.0000 g | Freq: Three times a day (TID) | INTRAVENOUS | Status: DC
  Administered 2021-04-23 – 2021-04-25 (×7): 2 g via INTRAVENOUS
  Filled 2021-04-23 (×7): qty 2

## 2021-04-23 MED ORDER — VANCOMYCIN HCL 1500 MG/300ML IV SOLN
1500.0000 mg | INTRAVENOUS | Status: DC
  Administered 2021-04-24: 1500 mg via INTRAVENOUS
  Filled 2021-04-23 (×2): qty 300

## 2021-04-23 MED ORDER — INSULIN ASPART 100 UNIT/ML IJ SOLN
0.0000 [IU] | Freq: Three times a day (TID) | INTRAMUSCULAR | Status: DC
  Administered 2021-04-23: 2 [IU] via SUBCUTANEOUS
  Administered 2021-04-23 – 2021-04-24 (×2): 1 [IU] via SUBCUTANEOUS
  Administered 2021-04-24: 2 [IU] via SUBCUTANEOUS
  Administered 2021-04-24 – 2021-04-26 (×4): 1 [IU] via SUBCUTANEOUS

## 2021-04-23 MED ORDER — NON FORMULARY: 10.0000 [IU] | Freq: Three times a day (TID) | Status: DC

## 2021-04-23 MED ORDER — VANCOMYCIN HCL 1500 MG/300ML IV SOLN: 1500.0000 mg | INTRAVENOUS | Status: DC

## 2021-04-23 MED ORDER — CHLORHEXIDINE GLUCONATE CLOTH 2 % EX PADS
6.0000 | MEDICATED_PAD | Freq: Every day | CUTANEOUS | Status: DC
  Administered 2021-04-24 – 2021-04-26 (×3): 6 via TOPICAL

## 2021-04-23 MED ORDER — INSULIN GLARGINE-YFGN 100 UNIT/ML ~~LOC~~ SOLN
30.0000 [IU] | Freq: Every day | SUBCUTANEOUS | Status: DC
  Administered 2021-04-23 – 2021-04-24 (×3): 30 [IU] via SUBCUTANEOUS
  Filled 2021-04-23 (×3): qty 0.3

## 2021-04-23 MED ORDER — CHLORHEXIDINE GLUCONATE CLOTH 2 % EX PADS
6.0000 | MEDICATED_PAD | Freq: Every day | CUTANEOUS | Status: DC
  Administered 2021-04-25 – 2021-04-26 (×2): 6 via TOPICAL

## 2021-04-23 MED ORDER — MORPHINE SULFATE (PF) 2 MG/ML IV SOLN
2.0000 mg | Freq: Once | INTRAVENOUS | Status: AC
  Administered 2021-04-23: 2 mg via INTRAVENOUS
  Filled 2021-04-23: qty 1

## 2021-04-23 MED ORDER — VANCOMYCIN HCL 2000 MG/400ML IV SOLN
2000.0000 mg | Freq: Once | INTRAVENOUS | Status: AC
  Administered 2021-04-23: 2000 mg via INTRAVENOUS
  Filled 2021-04-23: qty 400

## 2021-04-23 MED ORDER — INSULIN ASPART 100 UNIT/ML IJ SOLN
10.0000 [IU] | Freq: Three times a day (TID) | INTRAMUSCULAR | Status: DC
  Administered 2021-04-23 – 2021-04-26 (×10): 10 [IU] via SUBCUTANEOUS

## 2021-04-23 MED ORDER — SODIUM CHLORIDE 0.9 % IV SOLN: INTRAVENOUS | Status: AC

## 2021-04-23 NOTE — Sepsis Progress Note (Signed)
Notified bedside nurse in ED of need to draw repeat lactic acid.

## 2021-04-23 NOTE — Progress Notes (Signed)
Pharmacy Antibiotic Note  Natalie Macdonald is a 65 y.o. female admitted on 04/22/2021 with medical history significant of degenerative disc disease, diabetes, chronic pain, hypertension, hyperlipidemia, sarcoidosis, history of to myelitis, chronic indwelling cath, diverting colostomy, GERD, hypothyroidism, paraplegia presenting with altered mental status..  Pharmacy has been consulted to dose vancomycin and cefepime for sepsis  Plan: Vancomycin 2gm IV x 1 then 1500mg  q24, AUC 536, SCr 0.99 Cefepime 2gm IV q8h Flagyl 500mg  IV q12  F/u wt, originally used 106.5kg from 3/22 > 118 kg Follow up renal function, cultures and clinical course  Height: 5\' 6"  (167.6 cm) Weight: 118 kg (260 lb 2.3 oz) IBW/kg (Calculated) : 59.3  Temp (24hrs), Avg:99.5 F (37.5 C), Min:98 F (36.7 C), Max:100.8 F (38.2 C)  Recent Labs  Lab 04/22/21 2136 04/23/21 0042 04/23/21 0043 04/23/21 0404 04/23/21 0726  WBC 17.8*  --   --  17.6*  --   CREATININE 1.09*  --  0.63 0.91 0.99  LATICACIDVEN 2.3* 1.4 1.1  --   --      Estimated Creatinine Clearance: 73.1 mL/min (by C-G formula based on SCr of 0.99 mg/dL).    Allergies  Allergen Reactions   Grapefruit Bioflavonoid Complex Hives, Swelling and Other (See Comments)    Caused her to get hives, swelling of the eye, throat    Latex Hives   Procaine Anaphylaxis   Procaine Anaphylaxis   Grapefruit Flavor [Flavoring Agent]    Novolog [Insulin Aspart]     Okay to use Regular insulin (patient doesn't tolerate Novolog)     Penicillins Hives, Itching and Swelling    Tolerates Keflex, Augmentin (2021)    Antimicrobials this admission: 10/22 vanc >> 10/22 cefepime >> 10/22 flagyl >>   Dose adjustments this admission: 10/22 Vanc 1.5g q48h, AUC 493.4, Scr 1.09 > q24  Microbiology results: 10/21 BCx: sent 10/21 UCx: sent  Thank you for allowing pharmacy to be a part of this patient's care.  Minda Ditto PharmD 04/23/2021 9:55 AM

## 2021-04-23 NOTE — Progress Notes (Signed)
PROGRESS NOTE    Natalie Macdonald  ASN:[id number] DOB: 06-25-1956 DOA: 04/22/2021 PCP: Pcp, No   Brief Narrative:  Natalie Macdonald is a 65 y.o. female with medical history significant of degenerative disc disease, diabetes, chronic pain, hypertension, hyperlipidemia, sarcoidosis, history of osteomyelitis, chronic indwelling cath, diverting colostomy, GERD, hypothyroidism, paraplegia, chronic decubitus ulcer presenting with altered mental status likely due to unspecified infection.  Assessment & Plan:   Sepsis secondary to UTI, POA Less likely acute sacral ulcer infection Acute metabolic encephalopathy secondary to infectious process, POA, resolving Concurrent lactic acidosis - Continue vancomycin, cefepime, Flagyl - Follow-up urine cultures and blood cultures; procalcitonin initially negative - reassuring against systemic infection although cannot rule out local infection - At intake due to AME home meds were held including: amitriptyline, duloxetine, hydroxyzine, baclofen, gabapentin - will resume as indicated. - WOC consult for decubitus ulcer - appears stable,  without acute infection   Hyponatremia, mild - On IVF in the setting of sepsis/lactic acidosis  - Continue to follow   AKI, resolving - Creatinine downtrending appropriately. - Avoid nephrotoxins - Trend renal function and electrolytes - Has received IV fluids in the ED but rate will be determined based on sodium response initial IV fluid bolus.   Diabetes, insulin dependent - Continue in house sliding scale, novolog/lantus until home insulin can be brought in (she reports poor control on novolog/lantus in the past)  Chronic pain -Continue home oxycodone   Hypothyroidism - Continue home Synthroid  Hyperlipidemia - Continue home statin   Hypertension > Given low normal blood pressure and sepsis we will currently hold antihypertensive. - Hold home amlodipine-benazepril, carvedilol, chlorthalidone   DVT prophylaxis:  Lovenox  Code Status: Full  Status is: Inpt  Dispo: The patient is from: Home              Anticipated d/c is to: Same              Anticipated d/c date is: 48-72h              Patient currently not medically stable for discharge  Consultants:  None  Procedures:  None  Antimicrobials:  Cefepime/flagyl/vancomycin 04/22/21 - ongoing   Subjective: No acute issues/events overnight  Objective: Vitals:   04/22/21 2300 04/22/21 2315 04/23/21 0152 04/23/21 0605  BP: 128/84 137/90 129/64 118/67  Pulse: 86 86 86 84  Resp: 20  18 18   Temp:  98.9 F (37.2 C) 100.1 F (37.8 C) 98 F (36.7 C)  TempSrc:  Oral Oral Oral  SpO2: 99% 100% 97% 94%  Weight:   118 kg   Height:   5\' 6"  (1.676 m)     Intake/Output Summary (Last 24 hours) at 04/23/2021 0657 Last data filed at 04/23/2021 0450 Gross per 24 hour  Intake 1335.73 ml  Output --  Net 1335.73 ml   Filed Weights   04/23/21 0152  Weight: 118 kg    Examination:  General exam: Appears calm and comfortable  Respiratory system: Clear to auscultation. Respiratory effort normal. Cardiovascular system: S1 & S2 heard, RRR. No JVD, murmurs, rubs, gallops or clicks. No pedal edema. Gastrointestinal system: Abdomen is nondistended, soft and nontender. No organomegaly or masses felt. Normal bowel sounds heard. Central nervous system: Alert and oriented. No focal neurological deficits. Extremities: Symmetric 5 x 5 power BUE; lower extremities flaccid Skin: No rashes, lesions; sacral ulcers (chronic) noted per nursing documentation Psychiatry: Judgement and insight appear normal. Mood & affect appropriate.   Data Reviewed: I have personally reviewed  following labs and imaging studies  CBC: Recent Labs  Lab 04/22/21 2136 04/23/21 0404  WBC 17.8* 17.6*  NEUTROABS 12.7*  --   HGB 10.2* 9.4*  HCT 30.4* 28.4*  MCV 87.1 87.1  PLT 294 160   Basic Metabolic Panel: Recent Labs  Lab 04/22/21 2136 04/23/21 0043 04/23/21 0404  NA  118* 119* 120*  K 4.6 4.4 4.4  CL 81* 82* 82*  CO2 29 27 29   GLUCOSE 227* 229* 230*  BUN 25* 24* 23  CREATININE 1.09* 0.63 0.91  CALCIUM 8.5* 7.9* 8.4*   GFR: Estimated Creatinine Clearance: 79.5 mL/min (by C-G formula based on SCr of 0.91 mg/dL). Liver Function Tests: Recent Labs  Lab 04/22/21 2136  AST 19  ALT 17  ALKPHOS 59  BILITOT 0.7  PROT 6.9  ALBUMIN 3.0*   No results for input(s): LIPASE, AMYLASE in the last 168 hours. No results for input(s): AMMONIA in the last 168 hours. Coagulation Profile: Recent Labs  Lab 04/22/21 2135 04/23/21 0404  INR 1.1 1.2   Cardiac Enzymes: No results for input(s): CKTOTAL, CKMB, CKMBINDEX, TROPONINI in the last 168 hours. BNP (last 3 results) No results for input(s): PROBNP in the last 8760 hours. HbA1C: No results for input(s): HGBA1C in the last 72 hours. CBG: No results for input(s): GLUCAP in the last 168 hours. Lipid Profile: No results for input(s): CHOL, HDL, LDLCALC, TRIG, CHOLHDL, LDLDIRECT in the last 72 hours. Thyroid Function Tests: Recent Labs    04/22/21 2136  TSH 11.201*   Anemia Panel: No results for input(s): VITAMINB12, FOLATE, FERRITIN, TIBC, IRON, RETICCTPCT in the last 72 hours. Sepsis Labs: Recent Labs  Lab 04/22/21 2136 04/23/21 0042 04/23/21 0043 04/23/21 0404  PROCALCITON  --   --   --  <0.10  LATICACIDVEN 2.3* 1.4 1.1  --     Recent Results (from the past 240 hour(s))  Resp Panel by RT-PCR (Flu A&B, Covid)     Status: None   Collection Time: 04/22/21  9:36 PM   Specimen: Nasopharyngeal(NP) swabs in vial transport medium  Result Value Ref Range Status   SARS Coronavirus 2 by RT PCR NEGATIVE NEGATIVE Final    Comment: (NOTE) SARS-CoV-2 target nucleic acids are NOT DETECTED.  The SARS-CoV-2 RNA is generally detectable in upper respiratory specimens during the acute phase of infection. The lowest concentration of SARS-CoV-2 viral copies this assay can detect is 138 copies/mL. A  negative result does not preclude SARS-Cov-2 infection and should not be used as the sole basis for treatment or other patient management decisions. A negative result may occur with  improper specimen collection/handling, submission of specimen other than nasopharyngeal swab, presence of viral mutation(s) within the areas targeted by this assay, and inadequate number of viral copies(<138 copies/mL). A negative result must be combined with clinical observations, patient history, and epidemiological information. The expected result is Negative.  Fact Sheet for Patients:  EntrepreneurPulse.com.au  Fact Sheet for Healthcare Providers:  IncredibleEmployment.be  This test is no t yet approved or cleared by the [place] FDA and  has been authorized for detection and/or diagnosis of SARS-CoV-2 by FDA under an Emergency Use Authorization (EUA). This EUA will remain  in effect (meaning this test can be used) for the duration of the COVID-19 declaration under Section 564(b)(1) of the Act, 21 U.S.C.section 360bbb-3(b)(1), unless the authorization is terminated  or revoked sooner.       Influenza A by PCR NEGATIVE NEGATIVE Final   Influenza B by PCR  NEGATIVE NEGATIVE Final    Comment: (NOTE) The Xpert Xpress SARS-CoV-2/FLU/RSV plus assay is intended as an aid in the diagnosis of influenza from Nasopharyngeal swab specimens and should not be used as a sole basis for treatment. Nasal washings and aspirates are unacceptable for Xpert Xpress SARS-CoV-2/FLU/RSV testing.  Fact Sheet for Patients: EntrepreneurPulse.com.au  Fact Sheet for Healthcare Providers: IncredibleEmployment.be  This test is not yet approved or cleared by the [place] FDA and has been authorized for detection and/or diagnosis of SARS-CoV-2 by FDA under an Emergency Use Authorization (EUA). This EUA will remain in effect (meaning this test can  be used) for the duration of the COVID-19 declaration under Section 564(b)(1) of the Act, 21 U.S.C. section 360bbb-3(b)(1), unless the authorization is terminated or revoked.  Performed at [facility], [place] [address] [place], [place] [address]          Radiology Studies: CT Head Wo Contrast  Result Date: 04/22/2021 CLINICAL DATA:  Altered mental status. EXAM: CT HEAD WITHOUT CONTRAST TECHNIQUE: Contiguous axial images were obtained from the base of the skull through the vertex without intravenous contrast. COMPARISON:  January 03, 2018 FINDINGS: Brain: There is mild cerebral atrophy with widening of the extra-axial spaces and ventricular dilatation. There are areas of decreased attenuation within the white matter tracts of the supratentorial brain, consistent with microvascular disease changes. Vascular: No hyperdense vessel or unexpected calcification. Skull: Normal. Negative for fracture or focal lesion. Sinuses/Orbits: No acute finding. Other: None. IMPRESSION: 1. Generalized cerebral atrophy. 2. No acute intracranial abnormality. Electronically Signed   By: Virgina Norfolk M.D.   On: 04/22/2021 20:55   DG Chest Port 1 View  Result Date: 04/22/2021 CLINICAL DATA:  Altered mental status. EXAM: PORTABLE CHEST 1 VIEW COMPARISON:  September 08, 2019 FINDINGS: Low lung volumes are seen. The cardiac silhouette is borderline in size. A radiopaque fusion plate and screws are seen overlying the lower cervical spine. Degenerative changes are seen involving the bilateral shoulders. IMPRESSION: Low lung volumes without evidence of acute or active cardiopulmonary disease. Electronically Signed   By: Virgina Norfolk M.D.   On: 04/22/2021 20:36        Scheduled Meds:  enoxaparin (LOVENOX) injection  40 mg Subcutaneous Q24H   insulin glargine-yfgn  30 Units Subcutaneous QHS   levothyroxine  168 mcg Oral Q0600   NON FORMULARY 10 Units  10 Units Subcutaneous TID with meals    pantoprazole  40 mg Oral Daily   pravastatin  20 mg Oral Daily   sodium chloride flush  3 mL Intravenous Q12H   Continuous Infusions:  ceFEPime (MAXIPIME) IV     [START ON 04/24/2021] metronidazole     [START ON 04/25/2021] vancomycin     vancomycin 2,000 mg (04/23/21 0507)     LOS: 1 day   Time spent: 31min  Sabas Frett C Charle Clear, DO [facility]  If 7PM-7AM, please contact night-coverage [email or web address]  04/23/2021, 6:57 AM

## 2021-04-23 NOTE — Progress Notes (Signed)
In flowsheets updated several of the old skin assessments on pressure injuries. 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 Also removed and updated left thigh pressure injury (12/2017, 01/2018). All were present on admission. Skin assessment was completed with Corky Sox RN, Kim RN and Annye Asa.

## 2021-04-23 NOTE — Progress Notes (Addendum)
Pharmacy Antibiotic Note  Natalie Macdonald is a 65 y.o. female admitted on 04/22/2021 with medical history significant of degenerative disc disease, diabetes, chronic pain, hypertension, hyperlipidemia, sarcoidosis, history of to myelitis, chronic indwelling cath, diverting colostomy, GERD, hypothyroidism, paraplegia presenting with altered mental status..  Pharmacy has been consulted to dose vancomycin and cefepime for sepsis  Plan: Vancomycin 2gm IV x 1 then 1500mg  q48h (AUC 493.4, Scr 1.09) Cefepime 2gm IV q8h Follow up renal function, cultures and clinical course     Temp (24hrs), Avg:99.9 F (37.7 C), Min:98.9 F (37.2 C), Max:100.8 F (38.2 C)  Recent Labs  Lab 04/22/21 2136  WBC 17.8*  CREATININE 1.09*  LATICACIDVEN 2.3*    CrCl cannot be calculated (Unknown ideal weight.).    Allergies  Allergen Reactions   Grapefruit Bioflavonoid Complex Hives, Swelling and Other (See Comments)    Caused her to get hives, swelling of the eye, throat    Latex Hives   Procaine Anaphylaxis   Procaine Anaphylaxis   Grapefruit Flavor [Flavoring Agent]    Novolog [Insulin Aspart]     Okay to use Regular insulin (patient doesn't tolerate Novolog)     Penicillins Hives, Itching and Swelling    Tolerates Keflex, Augmentin (2021)    Antimicrobials this admission: 10/22 vanc >> 10/22 cefepime >> 10/22 flagyl >>   Dose adjustments this admission:   Microbiology results: 10/21 BCx:  10/21 UCx:   Thank you for allowing pharmacy to be a part of this patient's care.  Angela Adam 04/23/2021 1:05 AM

## 2021-04-23 NOTE — Plan of Care (Signed)

## 2021-04-24 ENCOUNTER — Encounter (HOSPITAL_COMMUNITY): Payer: Self-pay | Admitting: Internal Medicine

## 2021-04-24 DIAGNOSIS — M5442 Lumbago with sciatica, left side: Secondary | ICD-10-CM | POA: Diagnosis not present

## 2021-04-24 DIAGNOSIS — N179 Acute kidney failure, unspecified: Secondary | ICD-10-CM | POA: Diagnosis not present

## 2021-04-24 DIAGNOSIS — A419 Sepsis, unspecified organism: Secondary | ICD-10-CM | POA: Diagnosis not present

## 2021-04-24 DIAGNOSIS — G9340 Encephalopathy, unspecified: Secondary | ICD-10-CM | POA: Diagnosis not present

## 2021-04-24 LAB — HEMOGLOBIN A1C
Hgb A1c MFr Bld: 8.3 % — ABNORMAL HIGH (ref 4.8–5.6)
Mean Plasma Glucose: 191.51 mg/dL

## 2021-04-24 LAB — BASIC METABOLIC PANEL
Anion gap: 8 (ref 5–15)
BUN: 20 mg/dL (ref 8–23)
CO2: 27 mmol/L (ref 22–32)
Calcium: 8.5 mg/dL — ABNORMAL LOW (ref 8.9–10.3)
Chloride: 89 mmol/L — ABNORMAL LOW (ref 98–111)
Creatinine, Ser: 0.59 mg/dL (ref 0.44–1.00)
GFR, Estimated: >60 mL/min (ref >60)
Glucose, Bld: 172 mg/dL — ABNORMAL HIGH (ref 70–99)
Potassium: 3.8 mmol/L (ref 3.5–5.1)
Sodium: 124 mmol/L — ABNORMAL LOW (ref 135–145)

## 2021-04-24 LAB — GLUCOSE, CAPILLARY
Glucose-Capillary: 182 mg/dL — ABNORMAL HIGH (ref 70–99)
Glucose-Capillary: 184 mg/dL — ABNORMAL HIGH (ref 70–99)
Glucose-Capillary: 207 mg/dL — ABNORMAL HIGH (ref 70–99)
Glucose-Capillary: 240 mg/dL — ABNORMAL HIGH (ref 70–99)
Glucose-Capillary: 221 mg/dL — ABNORMAL HIGH (ref 70–99)

## 2021-04-24 LAB — URINE CULTURE

## 2021-04-24 LAB — OSMOLALITY, URINE: Osmolality, Ur: 229 mOsm/kg — ABNORMAL LOW (ref 300–900)

## 2021-04-24 MED ORDER — HYDROXYZINE HCL 10 MG PO TABS
10.0000 mg | ORAL_TABLET | Freq: Every day | ORAL | Status: DC
  Administered 2021-04-24 – 2021-04-26 (×3): 10 mg via ORAL
  Filled 2021-04-24 (×3): qty 1

## 2021-04-24 MED ORDER — MONTELUKAST SODIUM 10 MG PO TABS
10.0000 mg | ORAL_TABLET | Freq: Every day | ORAL | Status: DC
  Administered 2021-04-24 – 2021-04-26 (×3): 10 mg via ORAL
  Filled 2021-04-24 (×3): qty 1

## 2021-04-24 MED ORDER — GABAPENTIN 300 MG PO CAPS
300.0000 mg | ORAL_CAPSULE | Freq: Four times a day (QID) | ORAL | Status: DC
  Administered 2021-04-24 – 2021-04-26 (×11): 300 mg via ORAL
  Filled 2021-04-24 (×12): qty 1

## 2021-04-24 MED ORDER — AMITRIPTYLINE HCL 25 MG PO TABS
100.0000 mg | ORAL_TABLET | Freq: Every day | ORAL | Status: DC
  Administered 2021-04-24 – 2021-04-26 (×3): 100 mg via ORAL
  Filled 2021-04-24 (×3): qty 4

## 2021-04-24 MED ORDER — FLUTICASONE PROPIONATE 50 MCG/ACT NA SUSP
2.0000 | Freq: Every day | NASAL | Status: DC | PRN
  Filled 2021-04-24 (×2): qty 16

## 2021-04-24 MED ORDER — CYCLOBENZAPRINE HCL 10 MG PO TABS
10.0000 mg | ORAL_TABLET | Freq: Every day | ORAL | Status: DC
  Administered 2021-04-24 – 2021-04-26 (×3): 10 mg via ORAL
  Filled 2021-04-24 (×3): qty 1

## 2021-04-24 NOTE — Progress Notes (Signed)
PROGRESS NOTE    Natalie Macdonald  GUR:[id number] DOB: [birth year] DOA: 04/22/2021 PCP: Pcp, No   Brief Narrative:  Natalie Macdonald is a 65 y.o. female with medical history significant of degenerative disc disease, diabetes, chronic pain, hypertension, hyperlipidemia, sarcoidosis, history of osteomyelitis, chronic indwelling cath, diverting colostomy, GERD, hypothyroidism, paraplegia, chronic decubitus ulcer presenting with altered mental status likely due to unspecified infection.  Assessment & Plan:   Sepsis secondary to UTI, POA Acute sacral ulcer infection ruled out Acute metabolic encephalopathy secondary to infectious process, POA, resolving Concurrent lactic acidosis - Continue vancomycin, cefepime, Flagyl and follow cultures - Urine culture shows multiple species - likely complicated given chronic indwelling foley - Blood cultures; procalcitonin initially negative - reassuring against systemic infection although cannot rule out local infection - At intake due to AME home meds were held including: amitriptyline, hydroxyzine, gabapentin - resumed 04/24/21. - WOC consult for decubitus ulcer - appears stable,  without acute infection   Hyponatremia, mild - IVF off - correcting with increased PO intake - Continue to follow   AKI, resolving - Creatinine downtrending appropriately. - Avoid nephrotoxins - Trend renal function and electrolytes - Has received IV fluids in the ED but rate will be determined based on sodium response initial IV fluid bolus.   Diabetes, insulin dependent - Continue in house sliding scale, novolog/lantus until home insulin can be brought in (she reports poor control on novolog/lantus in the past)  Chronic pain -Continue home oxycodone   Hypothyroidism - Continue home Synthroid  Hyperlipidemia - Continue home statin   Hypertension > Given low normal blood pressure and sepsis we will currently hold antihypertensive. - Hold home amlodipine-benazepril,  carvedilol, chlorthalidone   DVT prophylaxis: Lovenox  Code Status: Full  Status is: Inpt  Dispo: The patient is from: Home              Anticipated d/c is to: Same              Anticipated d/c date is: 48-72h              Patient currently not medically stable for discharge  Consultants:  None  Procedures:  None  Antimicrobials:  Cefepime/flagyl/vancomycin 04/22/21 - ongoing   Subjective: No acute issues/events overnight, somewhat irritated today as she's "being kept from her family" as no one will call her back  Objective: Vitals:   04/23/21 0959 04/23/21 1341 04/23/21 2053 04/24/21 0521  BP: (!) 126/94 131/74 133/75 110/62  Pulse: 92 85 96 90  Resp: 15 15 20 20   Temp: 98.7 F (37.1 C) 98.3 F (36.8 C) (!) 100.8 F (38.2 C) 98.9 F (37.2 C)  TempSrc: Oral Oral Oral Oral  SpO2: 98% 99% 100% 98%  Weight:      Height:        Intake/Output Summary (Last 24 hours) at 04/24/2021 0737 Last data filed at 04/24/2021 0600 Gross per 24 hour  Intake 3850 ml  Output 3425 ml  Net 425 ml    Filed Weights   04/23/21 0152  Weight: 118 kg    Examination:  General exam: Appears calm and comfortable  Respiratory system: Clear to auscultation. Respiratory effort normal. Cardiovascular system: S1 & S2 heard, RRR. No JVD, murmurs, rubs, gallops or clicks. No pedal edema. Gastrointestinal system: Abdomen is nondistended, soft and nontender. No organomegaly or masses felt. Normal bowel sounds heard. Central nervous system: Alert and oriented. No focal neurological deficits. Extremities: Symmetric 5 x 5 power BUE; lower extremities flaccid Skin: No  rashes, lesions; sacral ulcers (chronic) noted per nursing documentation Psychiatry: Judgement and insight appear normal. Mood & affect appropriate.   Data Reviewed: I have personally reviewed following labs and imaging studies  CBC: Recent Labs  Lab 04/22/21 2136 04/23/21 0404  WBC 17.8* 17.6*  NEUTROABS 12.7*  --   HGB  10.2* 9.4*  HCT 30.4* 28.4*  MCV 87.1 87.1  PLT 294 314    Basic Metabolic Panel: Recent Labs  Lab 04/22/21 2136 04/23/21 0043 04/23/21 0404 04/23/21 0726 04/24/21 0436  NA 118* 119* 120* 121* 124*  K 4.6 4.4 4.4 4.1 3.8  CL 81* 82* 82* 84* 89*  CO2 29 27 29 29 27   GLUCOSE 227* 229* 230* 202* 172*  BUN 25* 24* 23 26* 20  CREATININE 1.09* 0.63 0.91 0.99 0.59  CALCIUM 8.5* 7.9* 8.4* 8.2* 8.5*    GFR: Estimated Creatinine Clearance: 90.4 mL/min (by C-G formula based on SCr of 0.59 mg/dL). Liver Function Tests: Recent Labs  Lab 04/22/21 2136  AST 19  ALT 17  ALKPHOS 59  BILITOT 0.7  PROT 6.9  ALBUMIN 3.0*    No results for input(s): LIPASE, AMYLASE in the last 168 hours. No results for input(s): AMMONIA in the last 168 hours. Coagulation Profile: Recent Labs  Lab 04/22/21 2135 04/23/21 0404  INR 1.1 1.2    Cardiac Enzymes: No results for input(s): CKTOTAL, CKMB, CKMBINDEX, TROPONINI in the last 168 hours. BNP (last 3 results) No results for input(s): PROBNP in the last 8760 hours. HbA1C: No results for input(s): HGBA1C in the last 72 hours. CBG: Recent Labs  Lab 04/23/21 0734 04/23/21 1150 04/23/21 1621 04/23/21 2117  GLUCAP 196* 190* 203* 278*   Lipid Profile: No results for input(s): CHOL, HDL, LDLCALC, TRIG, CHOLHDL, LDLDIRECT in the last 72 hours. Thyroid Function Tests: Recent Labs    04/22/21 2136  TSH 11.201*    Anemia Panel: No results for input(s): VITAMINB12, FOLATE, FERRITIN, TIBC, IRON, RETICCTPCT in the last 72 hours. Sepsis Labs: Recent Labs  Lab 04/22/21 2136 04/23/21 0042 04/23/21 0043 04/23/21 0404  PROCALCITON  --   --   --  <0.10  LATICACIDVEN 2.3* 1.4 1.1  --      Recent Results (from the past 240 hour(s))  Resp Panel by RT-PCR (Flu A&B, Covid)     Status: None   Collection Time: 04/22/21  9:36 PM   Specimen: Nasopharyngeal(NP) swabs in vial transport medium  Result Value Ref Range Status   SARS Coronavirus 2  by RT PCR NEGATIVE NEGATIVE Final    Comment: (NOTE) SARS-CoV-2 target nucleic acids are NOT DETECTED.  The SARS-CoV-2 RNA is generally detectable in upper respiratory specimens during the acute phase of infection. The lowest concentration of SARS-CoV-2 viral copies this assay can detect is 138 copies/mL. A negative result does not preclude SARS-Cov-2 infection and should not be used as the sole basis for treatment or other patient management decisions. A negative result may occur with  improper specimen collection/handling, submission of specimen other than nasopharyngeal swab, presence of viral mutation(s) within the areas targeted by this assay, and inadequate number of viral copies(<138 copies/mL). A negative result must be combined with clinical observations, patient history, and epidemiological information. The expected result is Negative.  Fact Sheet for Patients:  EntrepreneurPulse.com.au  Fact Sheet for Healthcare Providers:  IncredibleEmployment.be  This test is no t yet approved or cleared by the [place] FDA and  has been authorized for detection and/or diagnosis of SARS-CoV-2 by FDA  under an Emergency Use Authorization (EUA). This EUA will remain  in effect (meaning this test can be used) for the duration of the COVID-19 declaration under Section 564(b)(1) of the Act, 21 U.S.C.section 360bbb-3(b)(1), unless the authorization is terminated  or revoked sooner.       Influenza A by PCR NEGATIVE NEGATIVE Final   Influenza B by PCR NEGATIVE NEGATIVE Final    Comment: (NOTE) The Xpert Xpress SARS-CoV-2/FLU/RSV plus assay is intended as an aid in the diagnosis of influenza from Nasopharyngeal swab specimens and should not be used as a sole basis for treatment. Nasal washings and aspirates are unacceptable for Xpert Xpress SARS-CoV-2/FLU/RSV testing.  Fact Sheet for Patients: EntrepreneurPulse.com.au  Fact  Sheet for Healthcare Providers: IncredibleEmployment.be  This test is not yet approved or cleared by the [place] FDA and has been authorized for detection and/or diagnosis of SARS-CoV-2 by FDA under an Emergency Use Authorization (EUA). This EUA will remain in effect (meaning this test can be used) for the duration of the COVID-19 declaration under Section 564(b)(1) of the Act, 21 U.S.C. section 360bbb-3(b)(1), unless the authorization is terminated or revoked.  Performed at [facility], [place] [address], [place], [place] [address]   Culture, blood (Routine X 2) w Reflex to ID Panel     Status: None (Preliminary result)   Collection Time: 04/22/21  9:50 PM   Specimen: BLOOD  Result Value Ref Range Status   Specimen Description   Final    BLOOD SITE NOT SPECIFIED Performed at [facility] [place] [address] [place], [place] [address]    Special Requests   Final    BOTTLES DRAWN AEROBIC AND ANAEROBIC Blood Culture adequate volume Performed at [facility] [address] [place], [place] [address]    Culture   Final    NO GROWTH < 12 HOURS Performed at [facility] [address], [place],  [address]    Report Status PENDING  Incomplete          Radiology Studies: CT Head Wo Contrast  Result Date: 04/22/2021 CLINICAL DATA:  Altered mental status. EXAM: CT HEAD WITHOUT CONTRAST TECHNIQUE: Contiguous axial images were obtained from the base of the skull through the vertex without intravenous contrast. COMPARISON:  January 03, 2018 FINDINGS: Brain: There is mild cerebral atrophy with widening of the extra-axial spaces and ventricular dilatation. There are areas of decreased attenuation within the white matter tracts of the supratentorial brain, consistent with microvascular disease changes. Vascular: No hyperdense vessel or unexpected calcification. Skull: Normal. Negative for fracture or  focal lesion. Sinuses/Orbits: No acute finding. Other: None. IMPRESSION: 1. Generalized cerebral atrophy. 2. No acute intracranial abnormality. Electronically Signed   By: Virgina Norfolk M.D.   On: 04/22/2021 20:55   DG Chest Port 1 View  Result Date: 04/22/2021 CLINICAL DATA:  Altered mental status. EXAM: PORTABLE CHEST 1 VIEW COMPARISON:  September 08, 2019 FINDINGS: Low lung volumes are seen. The cardiac silhouette is borderline in size. A radiopaque fusion plate and screws are seen overlying the lower cervical spine. Degenerative changes are seen involving the bilateral shoulders. IMPRESSION: Low lung volumes without evidence of acute or active cardiopulmonary disease. Electronically Signed   By: Virgina Norfolk M.D.   On: 04/22/2021 20:36        Scheduled Meds:  Chlorhexidine Gluconate Cloth  6 each Topical Daily   Chlorhexidine Gluconate Cloth  6 each Topical Daily   enoxaparin (LOVENOX) injection  40  mg Subcutaneous Q24H   insulin aspart  0-5 Units Subcutaneous QHS   insulin aspart  0-6 Units Subcutaneous TID WC   insulin aspart  10 Units Subcutaneous TID WC   insulin glargine-yfgn  30 Units Subcutaneous QHS   levothyroxine  168 mcg Oral Q0600   pantoprazole  40 mg Oral Daily   pravastatin  20 mg Oral Daily   sodium chloride flush  3 mL Intravenous Q12H   Continuous Infusions:  ceFEPime (MAXIPIME) IV Stopped (04/24/21 0017)   metronidazole     vancomycin       LOS: 2 days   Time spent: 61min  Xiamara Hulet C Shevawn Langenberg, DO [facility]  If 7PM-7AM, please contact night-coverage [email or web address]  04/24/2021, 7:37 AM

## 2021-04-25 DIAGNOSIS — A419 Sepsis, unspecified organism: Secondary | ICD-10-CM | POA: Diagnosis not present

## 2021-04-25 DIAGNOSIS — G9340 Encephalopathy, unspecified: Secondary | ICD-10-CM | POA: Diagnosis not present

## 2021-04-25 DIAGNOSIS — N179 Acute kidney failure, unspecified: Secondary | ICD-10-CM | POA: Diagnosis not present

## 2021-04-25 DIAGNOSIS — M5442 Lumbago with sciatica, left side: Secondary | ICD-10-CM | POA: Diagnosis not present

## 2021-04-25 LAB — BASIC METABOLIC PANEL WITH GFR
Anion gap: 7 (ref 5–15)
BUN: 12 mg/dL (ref 8–23)
CO2: 27 mmol/L (ref 22–32)
Calcium: 8.3 mg/dL — ABNORMAL LOW (ref 8.9–10.3)
Chloride: 95 mmol/L — ABNORMAL LOW (ref 98–111)
Creatinine, Ser: 0.62 mg/dL (ref 0.44–1.00)
GFR, Estimated: >60 mL/min
Glucose, Bld: 182 mg/dL — ABNORMAL HIGH (ref 70–99)
Potassium: 3.6 mmol/L (ref 3.5–5.1)
Sodium: 129 mmol/L — ABNORMAL LOW (ref 135–145)

## 2021-04-25 LAB — GLUCOSE, CAPILLARY
Glucose-Capillary: 168 mg/dL — ABNORMAL HIGH (ref 70–99)
Glucose-Capillary: 188 mg/dL — ABNORMAL HIGH (ref 70–99)
Glucose-Capillary: 171 mg/dL — ABNORMAL HIGH (ref 70–99)
Glucose-Capillary: 177 mg/dL — ABNORMAL HIGH (ref 70–99)

## 2021-04-25 LAB — MRSA NEXT GEN BY PCR, NASAL: MRSA by PCR Next Gen: NOT DETECTED

## 2021-04-25 MED ORDER — INSULIN GLARGINE-YFGN 100 UNIT/ML ~~LOC~~ SOLN
20.0000 [IU] | Freq: Two times a day (BID) | SUBCUTANEOUS | Status: DC
  Administered 2021-04-25 – 2021-04-26 (×4): 20 [IU] via SUBCUTANEOUS
  Filled 2021-04-25 (×4): qty 0.2

## 2021-04-25 MED ORDER — SALINE SPRAY 0.65 % NA SOLN
1.0000 | NASAL | Status: DC | PRN
  Filled 2021-04-25: qty 44

## 2021-04-25 MED ORDER — COLLAGENASE 250 UNIT/GM EX OINT
TOPICAL_OINTMENT | Freq: Every day | CUTANEOUS | Status: DC
  Administered 2021-04-25 – 2021-04-26 (×2): 1 via TOPICAL
  Filled 2021-04-25 (×2): qty 30

## 2021-04-25 NOTE — Plan of Care (Signed)

## 2021-04-25 NOTE — Consult Note (Addendum)
[facility] Nurse Consult Note: Reason for Consult: Consult requested for multiple wounds.  Pt states her daughter performed dressings changes prior to admission.  Offered air mattress to patient and she declined.   Wound type: Sacrum/bilat buttocks with chronic Stage 4 pressure injury; very difficult to turn and assess with 2 person assist related to patient's pain. Approx 6X6X1cm, beefy red, mod amt tan drainage, bleeds small amt when dressing changed.  Left hip with 2 areas of dark red purple deep tissure pressure injuries which are beginning to evolve into Unstageable; lower hip/outer thigh area 4X4cm, upper hip 4X2cm.  Both with mod amt tan-green drainage and strong odor.  Stage 2 pressure injury on upper hip, .5X.5X.1cm, red and moist Right hip/outer thigh area with Unstageable pressure injury; 5X5cm, 100% yellow slough, mod amt tan drainage, some strong odor Left heel with pink dry scar tissue from previous wounds which have healed, no open wound noted. Right heel with 2 areas of deep tissue pressure injury; blood filled blisters with intact skin; .5X.5cm and 5X5cm Pressure Injury POA: Yes Dressing procedure/placement/frequency: Topical treatment orders provided for bedside nurses to perform as follows to provide enzymatic debridement and absorb drainage: 1.  Apply Santyl to left hip (2 wounds) and right hip wouns Q day, then cover with moist gauze and foam dressing.  (Change foam dressings Q 3 days or PRN when soiled. 2. Pack sacrum wound with Aquacel Kellie Simmering # [phone]) Q day, then cover with foam dressing.  (Change foam dressing Q 3 days or PRN soiling.) 3.  Foam dressings to bilat heels.  Float heels in Prevalon boots to reduce pressure.  Please re-consult if further assistance is needed.  Thank-you,  Julien Girt MSN, [place], [place], [place], [place]

## 2021-04-25 NOTE — Progress Notes (Signed)
PROGRESS NOTE    Natalie Macdonald  GUY:[id number] DOB: 08-Dec-1955 DOA: 04/22/2021 PCP: Pcp, No   Brief Narrative:  Natalie Macdonald is a 65 y.o. female with medical history significant of degenerative disc disease, diabetes, chronic pain, hypertension, hyperlipidemia, sarcoidosis, history of osteomyelitis, chronic indwelling cath, diverting colostomy, GERD, hypothyroidism, paraplegia, chronic decubitus ulcer presenting with altered mental status likely due to unspecified infection.  Assessment & Plan:   Sepsis secondary to UTI, POA, resolving Acute sacral ulcer infection ruled out Acute metabolic encephalopathy secondary to infectious process likely complicated by hyponatremia, POA, resolving Concurrent lactic acidosis - Discontinue vancomycin, cefepime, Flagyl and follow clinically - Urine culture shows multiple species -possibly dirty in the setting of chronic indwelling Foley, follow symptoms and reculture if necessary - Blood cultures; procalcitonin initially negative - reassuring against systemic infection although cannot rule out local infection - At intake due to AME home meds were held including: amitriptyline, hydroxyzine, gabapentin - resumed 04/24/21. - WOC consult for decubitus ulcer - appears stable,  without acute infection   Hyponatremia, resolving - IVF off - correcting with increased PO intake - Continue to follow - Mental status changes as above, likely concurrently affected by hyponatremia now resolving  AKI, resolving - Creatinine downtrending appropriately. - Avoid nephrotoxins - Trend renal function and electrolytes - Has received IV fluids in the ED but rate will be determined based on sodium response initial IV fluid bolus.   Diabetes, insulin dependent - Continue in house sliding scale, novolog/lantus until home insulin can be brought in (she reports poor control on novolog/lantus in the past) - increase to 20u BID glargine - A1C 8.3 - previously 9 and 11  outpatient so continues to improve  Chronic pain -Continue home oxycodone   Hypothyroidism - Continue home Synthroid  Hyperlipidemia - Continue home statin   Hypertension > Given low normal blood pressure and sepsis we will currently hold antihypertensive. - Hold home amlodipine-benazepril, carvedilol, chlorthalidone   DVT prophylaxis: Lovenox  Code Status: Full  Status is: Inpt  Dispo: The patient is from: Home              Anticipated d/c is to: Same              Anticipated d/c date is: 24-48h              Patient currently not medically stable for discharge  Consultants:  None  Procedures:  None  Antimicrobials:  Cefepime/flagyl/vancomycin 04/22/21 - ongoing   Subjective: No acute issues/events overnight, somewhat irritated today as she's "being kept from her family" as no one will call her back  Objective: Vitals:   04/24/21 0521 04/24/21 1832 04/24/21 2123 04/25/21 0508  BP: 110/62 (!) 142/68 131/78 121/70  Pulse: 90 84 85 98  Resp: 20 16 19 17   Temp: 98.9 F (37.2 C) 98.8 F (37.1 C) 98.2 F (36.8 C)   TempSrc: Oral Oral Oral   SpO2: 98% 100% 99% 99%  Weight:      Height:        Intake/Output Summary (Last 24 hours) at 04/25/2021 0719 Last data filed at 04/25/2021 0600 Gross per 24 hour  Intake 1640.03 ml  Output 2650 ml  Net -1009.97 ml    Filed Weights   04/23/21 0152  Weight: 118 kg    Examination:  General exam: Appears calm and comfortable  Respiratory system: Clear to auscultation. Respiratory effort normal. Cardiovascular system: S1 & S2 heard, RRR. No JVD, murmurs, rubs, gallops or clicks. No pedal  edema. Gastrointestinal system: Abdomen is nondistended, soft and nontender. No organomegaly or masses felt. Normal bowel sounds heard. Central nervous system: Alert and oriented. No focal neurological deficits. Extremities: Symmetric 5 x 5 power BUE; lower extremities flaccid Skin: No rashes, lesions; sacral ulcers (chronic) noted  per nursing documentation Psychiatry: Judgement and insight appear normal. Mood & affect appropriate.   Data Reviewed: I have personally reviewed following labs and imaging studies  CBC: Recent Labs  Lab 04/22/21 2136 04/23/21 0404  WBC 17.8* 17.6*  NEUTROABS 12.7*  --   HGB 10.2* 9.4*  HCT 30.4* 28.4*  MCV 87.1 87.1  PLT 294 149    Basic Metabolic Panel: Recent Labs  Lab 04/23/21 0043 04/23/21 0404 04/23/21 0726 04/24/21 0436 04/25/21 0425  NA 119* 120* 121* 124* 129*  K 4.4 4.4 4.1 3.8 3.6  CL 82* 82* 84* 89* 95*  CO2 27 29 29 27 27   GLUCOSE 229* 230* 202* 172* 182*  BUN 24* 23 26* 20 12  CREATININE 0.63 0.91 0.99 0.59 0.62  CALCIUM 7.9* 8.4* 8.2* 8.5* 8.3*    GFR: Estimated Creatinine Clearance: 90.4 mL/min (by C-G formula based on SCr of 0.62 mg/dL). Liver Function Tests: Recent Labs  Lab 04/22/21 2136  AST 19  ALT 17  ALKPHOS 59  BILITOT 0.7  PROT 6.9  ALBUMIN 3.0*    No results for input(s): LIPASE, AMYLASE in the last 168 hours. No results for input(s): AMMONIA in the last 168 hours. Coagulation Profile: Recent Labs  Lab 04/22/21 2135 04/23/21 0404  INR 1.1 1.2    Cardiac Enzymes: No results for input(s): CKTOTAL, CKMB, CKMBINDEX, TROPONINI in the last 168 hours. BNP (last 3 results) No results for input(s): PROBNP in the last 8760 hours. HbA1C: Recent Labs    04/24/21 0436  HGBA1C 8.3*   CBG: Recent Labs  Lab 04/24/21 0838 04/24/21 1222 04/24/21 1618 04/24/21 1833 04/24/21 2119  GLUCAP 182* 184* 207* 240* 221*    Lipid Profile: No results for input(s): CHOL, HDL, LDLCALC, TRIG, CHOLHDL, LDLDIRECT in the last 72 hours. Thyroid Function Tests: Recent Labs    04/22/21 2136  TSH 11.201*    Anemia Panel: No results for input(s): VITAMINB12, FOLATE, FERRITIN, TIBC, IRON, RETICCTPCT in the last 72 hours. Sepsis Labs: Recent Labs  Lab 04/22/21 2136 04/23/21 0042 04/23/21 0043 04/23/21 0404  PROCALCITON  --   --   --   <0.10  LATICACIDVEN 2.3* 1.4 1.1  --      Recent Results (from the past 240 hour(s))  Resp Panel by RT-PCR (Flu A&B, Covid)     Status: None   Collection Time: 04/22/21  9:36 PM   Specimen: Nasopharyngeal(NP) swabs in vial transport medium  Result Value Ref Range Status   SARS Coronavirus 2 by RT PCR NEGATIVE NEGATIVE Final    Comment: (NOTE) SARS-CoV-2 target nucleic acids are NOT DETECTED.  The SARS-CoV-2 RNA is generally detectable in upper respiratory specimens during the acute phase of infection. The lowest concentration of SARS-CoV-2 viral copies this assay can detect is 138 copies/mL. A negative result does not preclude SARS-Cov-2 infection and should not be used as the sole basis for treatment or other patient management decisions. A negative result may occur with  improper specimen collection/handling, submission of specimen other than nasopharyngeal swab, presence of viral mutation(s) within the areas targeted by this assay, and inadequate number of viral copies(<138 copies/mL). A negative result must be combined with clinical observations, patient history, and epidemiological information. The expected  result is Negative.  Fact Sheet for Patients:  EntrepreneurPulse.com.au  Fact Sheet for Healthcare Providers:  IncredibleEmployment.be  This test is no t yet approved or cleared by the [place] FDA and  has been authorized for detection and/or diagnosis of SARS-CoV-2 by FDA under an Emergency Use Authorization (EUA). This EUA will remain  in effect (meaning this test can be used) for the duration of the COVID-19 declaration under Section 564(b)(1) of the Act, 21 U.S.C.section 360bbb-3(b)(1), unless the authorization is terminated  or revoked sooner.       Influenza A by PCR NEGATIVE NEGATIVE Final   Influenza B by PCR NEGATIVE NEGATIVE Final    Comment: (NOTE) The Xpert Xpress SARS-CoV-2/FLU/RSV plus assay is intended as an  aid in the diagnosis of influenza from Nasopharyngeal swab specimens and should not be used as a sole basis for treatment. Nasal washings and aspirates are unacceptable for Xpert Xpress SARS-CoV-2/FLU/RSV testing.  Fact Sheet for Patients: EntrepreneurPulse.com.au  Fact Sheet for Healthcare Providers: IncredibleEmployment.be  This test is not yet approved or cleared by the [place] FDA and has been authorized for detection and/or diagnosis of SARS-CoV-2 by FDA under an Emergency Use Authorization (EUA). This EUA will remain in effect (meaning this test can be used) for the duration of the COVID-19 declaration under Section 564(b)(1) of the Act, 21 U.S.C. section 360bbb-3(b)(1), unless the authorization is terminated or revoked.  Performed at [facility], [place] [address], [place], [place] [address]   Culture, blood (Routine X 2) w Reflex to ID Panel     Status: None (Preliminary result)   Collection Time: 04/22/21  9:50 PM   Specimen: BLOOD  Result Value Ref Range Status   Specimen Description   Final    BLOOD SITE NOT SPECIFIED Performed at [facility] [address] [place], [place] [address]    Special Requests   Final    BOTTLES DRAWN AEROBIC AND ANAEROBIC Blood Culture adequate volume Performed at [facility] [address] [place], [place] [address]    Culture   Final    NO GROWTH 2 DAYS Performed at [place] [address] [place], [place] [address]    Report Status PENDING  Incomplete  Urine Culture     Status: Abnormal   Collection Time: 04/22/21 10:15 PM   Specimen: Urine, Clean Catch  Result Value Ref Range Status   Specimen Description   Final    URINE, CLEAN CATCH Performed at [facility], [place] [address] [place], [place] [address]    Special Requests   Final    NONE Performed at [facility], [place]  [address] [place], [place] [address]    Culture MULTIPLE SPECIES PRESENT, SUGGEST RECOLLECTION (A)  Final   Report Status 04/24/2021 FINAL  Final  Culture, blood (Routine X 2) w Reflex to ID Panel     Status: None (Preliminary result)   Collection Time: 04/23/21  1:00 AM   Specimen: BLOOD  Result Value Ref Range Status   Specimen Description   Final    BLOOD BLOOD LEFT HAND Performed at [facility] [address] [place], [place] [address]    Special Requests   Final    BOTTLES DRAWN AEROBIC AND ANAEROBIC Blood Culture adequate volume Performed at [facility] [address] [place],  [address]    Culture   Final    NO GROWTH 1 DAY Performed  at [facility], [place] [address] [place], [place] [address]    Report Status PENDING  Incomplete          Radiology Studies: No results found.      Scheduled Meds:  amitriptyline  100 mg Oral QHS   Chlorhexidine Gluconate Cloth  6 each Topical Daily   Chlorhexidine Gluconate Cloth  6 each Topical Daily   cyclobenzaprine  10 mg Oral QHS   enoxaparin (LOVENOX) injection  40 mg Subcutaneous Q24H   gabapentin  300 mg Oral QID   hydrOXYzine  10 mg Oral QHS   insulin aspart  0-5 Units Subcutaneous QHS   insulin aspart  0-6 Units Subcutaneous TID WC   insulin aspart  10 Units Subcutaneous TID WC   insulin glargine-yfgn  30 Units Subcutaneous QHS   levothyroxine  168 mcg Oral Q0600   montelukast  10 mg Oral QHS   pantoprazole  40 mg Oral Daily   pravastatin  20 mg Oral Daily   sodium chloride flush  3 mL Intravenous Q12H   Continuous Infusions:  ceFEPime (MAXIPIME) IV 2 g (04/25/21 0114)   metronidazole 500 mg (04/24/21 2330)   vancomycin Stopped (04/24/21 1430)     LOS: 3 days   Time spent: 85min  Yair Dusza C Teddie Curd, DO [facility]  If 7PM-7AM, please contact night-coverage [email or web address]  04/25/2021, 7:19 AM

## 2021-04-25 NOTE — Progress Notes (Signed)
Pt refused at this time to allow RN and NT to turn her to check her wounds.

## 2021-04-26 DIAGNOSIS — L89154 Pressure ulcer of sacral region, stage 4: Secondary | ICD-10-CM | POA: Diagnosis not present

## 2021-04-26 DIAGNOSIS — G9340 Encephalopathy, unspecified: Secondary | ICD-10-CM | POA: Diagnosis not present

## 2021-04-26 DIAGNOSIS — M5442 Lumbago with sciatica, left side: Secondary | ICD-10-CM | POA: Diagnosis not present

## 2021-04-26 DIAGNOSIS — G8194 Hemiplegia, unspecified affecting left nondominant side: Secondary | ICD-10-CM

## 2021-04-26 DIAGNOSIS — A419 Sepsis, unspecified organism: Secondary | ICD-10-CM | POA: Diagnosis not present

## 2021-04-26 DIAGNOSIS — N179 Acute kidney failure, unspecified: Secondary | ICD-10-CM | POA: Diagnosis not present

## 2021-04-26 LAB — BASIC METABOLIC PANEL
Anion gap: 7 (ref 5–15)
BUN: 9 mg/dL (ref 8–23)
CO2: 27 mmol/L (ref 22–32)
Calcium: 8.2 mg/dL — ABNORMAL LOW (ref 8.9–10.3)
Chloride: 96 mmol/L — ABNORMAL LOW (ref 98–111)
Creatinine, Ser: 0.47 mg/dL (ref 0.44–1.00)
GFR, Estimated: >60 mL/min (ref >60)
Glucose, Bld: 129 mg/dL — ABNORMAL HIGH (ref 70–99)
Potassium: 3.4 mmol/L — ABNORMAL LOW (ref 3.5–5.1)
Sodium: 130 mmol/L — ABNORMAL LOW (ref 135–145)

## 2021-04-26 LAB — GLUCOSE, CAPILLARY
Glucose-Capillary: 151 mg/dL — ABNORMAL HIGH (ref 70–99)
Glucose-Capillary: 137 mg/dL — ABNORMAL HIGH (ref 70–99)
Glucose-Capillary: 121 mg/dL — ABNORMAL HIGH (ref 70–99)
Glucose-Capillary: 129 mg/dL — ABNORMAL HIGH (ref 70–99)

## 2021-04-26 MED ORDER — AMLODIPINE BESY-BENAZEPRIL HCL 5-20 MG PO CAPS: 1.0000 | ORAL_CAPSULE | Freq: Every day | ORAL | 11 refills | Status: DC

## 2021-04-26 NOTE — TOC Transition Note (Signed)
Transition of Care [facility]) - CM/SW Discharge Note   Patient Details  Name: Natalie Macdonald MRN: [id number] Date of Birth: Feb 16, 1956  [facility] CM/SW Contact:  Leeroy Cha, RN Phone Number: 04/26/2021, 3:17 PM   Clinical Narrative:    Patient has ___multiple chronic health conditions____________________which requires ______patient_ to be positioned in ways not feasible with a                               (medical condition)                                                   (body part(s))  normal bed. Head must be elevated at least _____30______ or ___dyspnea and congestive health failure______________ .                                                                                 (30 or more)               (describe what happens if head not elevated)  ___________congestive heart failure and cellulitis__________________ ____________________________________ requires frequent and immediate changes in                   (Medical condition)                          (frequently / upon awakening)  body position which cannot be achieved with a normal bed.   Final next level of care: Home/Self Care Barriers to Discharge: Barriers Resolved   Patient Goals and CMS Choice Patient states their goals for this hospitalization and ongoing recovery are:: i will go home with my duaghter who takes care of me. CMS Medicare.gov Compare Post Acute Care list provided to:: Patient Choice offered to / list presented to : Patient  Discharge Placement                       Discharge Plan and Services   Discharge Planning Services: CM Consult Post Acute Care Choice: Home Health, Durable Medical Equipment          DME Arranged: Hospital bed DME Agency: [employer] Date DME Agency Contacted: 04/26/21   Representative spoke with at DME Agency: zack blank and [facility] HH Arranged: RN, PT, OT, Nurse's Aide, Patient Refused [place]       Representative spoke with at [place]:  refused by patient  Social Determinants of Health (SDOH) Interventions     Readmission Risk Interventions No flowsheet data found.

## 2021-04-26 NOTE — Discharge Summary (Signed)
Physician Discharge Summary  Natalie Macdonald VZC:[id number] DOB: 09/23/1955 DOA: 04/22/2021  PCP: Merryl Hacker, No  Admit date: 04/22/2021 Discharge date: 04/26/2021  Admitted From: Home Disposition: Home  Recommendations for Outpatient Follow-up:  Follow up with PCP in 1-2 weeks Please obtain BMP/CBC in one week  Home Health: None Equipment/Devices: None  Discharge Condition: Stable CODE STATUS: Full Diet recommendation: Low-salt low-fat diabetic diet  Brief/Interim Summary: Natalie Macdonald is a 65 y.o. female with medical history significant of degenerative disc disease, diabetes, chronic pain, hypertension, hyperlipidemia, sarcoidosis, history of osteomyelitis, chronic indwelling cath, diverting colostomy, GERD, hypothyroidism, paraplegia, chronic decubitus ulcer presenting with altered mental status likely due to infection.  Patient admitted as above with acute metabolic encephalopathy likely in setting of sepsis secondary to UTI as well as symptomatic hyponatremia, both improving with IV fluids and antibiotics.  Patient is now completed antibiotic course, tolerating p.o. quite well back to baseline per family discussion.  Otherwise stable and agreeable for discharge back home.  Will need close follow-up with PCP and ongoing home wound care as outlined below given chronic sacral decubitus ulcers as well as bilateral heel skin wounds both present on admission and chronic.  At this time patient otherwise to resume home medications as previously noted other than decreased dose of amlodipine benazepril given well-controlled blood pressure while admitted, her A1c is improving currently >8 but improving from previous 9 and 11 as noted by her daughter in the outpatient setting previously.   Assessment & Plan:   Sepsis secondary to UTI, POA, resolving Acute sacral ulcer infection ruled out Acute metabolic encephalopathy secondary to infectious process likely complicated by hyponatremia, POA,  resolving Concurrent lactic acidosis -Completed antibiotic course as above -Urine culture shows multiple species -possibly dirty in the setting of chronic indwelling Foley, follow symptoms and reculture if necessary in the near future if symptoms do not resolve - Blood cultures; procalcitonin initially negative - reassuring against systemic infection although cannot rule out local infection - At intake due to AME home meds were held including: amitriptyline, hydroxyzine, gabapentin - resumed 04/24/21 without any incident - WOC consult for decubitus ulcer - appears stable,  without acute infection, see recommendations for ongoing wound care at home   Hyponatremia, resolving - IVF off - correcting with increased PO intake -Essentially at baseline low 130s per chart review - Mental status changes as above, likely concurrently affected by hyponatremia now resolving   AKI, resolved - Creatinine downtrending appropriately. -Currently within normal limits   Diabetes, insulin dependent - Continue in house sliding scale, novolog/lantus until home insulin can be brought in (she reports poor control on novolog/lantus in the past) - increase to 20u BID glargine - A1C 8.3 - previously 9 and 11 outpatient so continues to improve   Chronic pain -Continue home oxycodone   Hypothyroidism - Continue home Synthroid  Hyperlipidemia - Continue home statin   Hypertension Decrease amlodipine benazepril, discontinue carvedilol, chlorthalidone  Discharge Instructions  Discharge Instructions     Call MD for:  severe uncontrolled pain   Complete by: As directed    Call MD for:  temperature >100.4   Complete by: As directed    Diet - low sodium heart healthy   Complete by: As directed    Discharge wound care:   Complete by: As directed    Wound care  Daily      Comments: 1.  Apply Santyl to left hip (2 wounds) and right hip wouns Q day, then cover with moist gauze and foam  dressing.  (Change foam  dressings Q 3 days or PRN when soiled. 2. Pack sacrum wound with Aquacel Kellie Simmering # [phone]) Q day, then cover with foam dressing.  (Change foam dressing Q 3 days or PRN soiling.)  Foam dressing to bilat heels, change Q 3 days or PRN soiling.   Increase activity slowly   Complete by: As directed       Allergies as of 04/26/2021       Reactions   Grapefruit Bioflavonoid Complex Hives, Swelling, Other (See Comments)   Caused her to get hives, swelling of the eye, throat    Latex Hives   Procaine Anaphylaxis   Procaine Anaphylaxis   Grapefruit Flavor [flavoring Agent]    Novolog [insulin Aspart]    Okay to use Regular insulin (patient doesn't tolerate Novolog)    Penicillins Hives, Itching, Swelling   Tolerates Keflex, Augmentin (2021)        Medication List     STOP taking these medications    amLODipine-benazepril 5-40 MG capsule Commonly known as: LOTREL Replaced by: amLODipine-benazepril 5-20 MG capsule       TAKE these medications    amitriptyline 100 MG tablet Commonly known as: ELAVIL Take 100 mg by mouth at bedtime.   amLODipine-benazepril 5-20 MG capsule Commonly known as: LOTREL Take 1 capsule by mouth daily. Replaces: amLODipine-benazepril 5-40 MG capsule   Apidra 100 UNIT/ML injection Generic drug: insulin glulisine Inject 18-25 Units into the skin 3 (three) times daily with meals. Sliding Scale   CALCIUM 600 PO Take 600 mg by mouth daily.   carvedilol 25 MG tablet Commonly known as: COREG Take 25 mg by mouth 2 (two) times daily with a meal.   cholecalciferol 25 MCG (1000 UNIT) tablet Commonly known as: VITAMIN D3 Take 1,000 Units by mouth daily.   CRANBERRY PO Take 2 capsules by mouth daily.   cyclobenzaprine 10 MG tablet Commonly known as: FLEXERIL Take 10 mg by mouth at bedtime.   diclofenac Sodium 1 % Gel Commonly known as: VOLTAREN Apply 2 g topically daily as needed (pain).   fluticasone 50 MCG/ACT nasal spray Commonly known as:  FLONASE Place 2 sprays into both nostrils daily as needed for allergies.   gabapentin 300 MG capsule Commonly known as: NEURONTIN Take 300 mg by mouth 4 (four) times daily.   hydrOXYzine 10 MG tablet Commonly known as: ATARAX/VISTARIL Take 10 mg by mouth at bedtime.   levothyroxine 112 MCG tablet Commonly known as: SYNTHROID Take 1.5 tablets (168 mcg total) by mouth daily before breakfast.   montelukast 10 MG tablet Commonly known as: SINGULAIR Take 10 mg by mouth at bedtime.   multivitamin tablet Take 1 tablet by mouth daily.   Oxycodone HCl 20 MG Tabs Take 1 tablet by mouth 5 (five) times daily.   pantoprazole 40 MG tablet Commonly known as: PROTONIX Take 40 mg by mouth daily.   pravastatin 20 MG tablet Commonly known as: PRAVACHOL Take 20 mg by mouth daily.   PROBIOTIC PO Take 1 capsule by mouth daily.   silver sulfADIAZINE 1 % cream Commonly known as: SILVADENE Apply 1 application topically 2 (two) times daily.   Toujeo SoloStar 300 UNIT/ML Solostar Pen Generic drug: insulin glargine (1 Unit Dial) Inject 25-35 Units into the skin daily. Sliding Scale.   vitamin B-12 1000 MCG tablet Commonly known as: CYANOCOBALAMIN Take 1,000 mcg by mouth daily.   vitamin B-6 250 MG tablet Take 250 mg by mouth daily.  Durable Medical Equipment  (From admission, onward)           Start     Ordered   04/26/21 1123  For home use only DME Hospital bed  Once       Question Answer Comment  Length of Need Lifetime   Patient has (list medical condition): sepsis   The above medical condition requires: Patient requires the ability to reposition frequently   Bed type Semi-electric   Support Surface: Alternating Pressure Pad and Pump      04/26/21 1123              Discharge Care Instructions  (From admission, onward)           Start     Ordered   04/26/21 0000  Discharge wound care:       Comments: Wound care  Daily      Comments: 1.   Apply Santyl to left hip (2 wounds) and right hip wouns Q day, then cover with moist gauze and foam dressing.  (Change foam dressings Q 3 days or PRN when soiled. 2. Pack sacrum wound with Aquacel Kellie Simmering # [phone]) Q day, then cover with foam dressing.  (Change foam dressing Q 3 days or PRN soiling.)  Foam dressing to bilat heels, change Q 3 days or PRN soiling.   04/26/21 1315            Allergies  Allergen Reactions   Grapefruit Bioflavonoid Complex Hives, Swelling and Other (See Comments)    Caused her to get hives, swelling of the eye, throat    Latex Hives   Procaine Anaphylaxis   Procaine Anaphylaxis   Grapefruit Flavor [Flavoring Agent]    Novolog [Insulin Aspart]     Okay to use Regular insulin (patient doesn't tolerate Novolog)     Penicillins Hives, Itching and Swelling    Tolerates Keflex, Augmentin (2021)    Consultations: None   Procedures/Studies: CT Head Wo Contrast  Result Date: 04/22/2021 CLINICAL DATA:  Altered mental status. EXAM: CT HEAD WITHOUT CONTRAST TECHNIQUE: Contiguous axial images were obtained from the base of the skull through the vertex without intravenous contrast. COMPARISON:  January 03, 2018 FINDINGS: Brain: There is mild cerebral atrophy with widening of the extra-axial spaces and ventricular dilatation. There are areas of decreased attenuation within the white matter tracts of the supratentorial brain, consistent with microvascular disease changes. Vascular: No hyperdense vessel or unexpected calcification. Skull: Normal. Negative for fracture or focal lesion. Sinuses/Orbits: No acute finding. Other: None. IMPRESSION: 1. Generalized cerebral atrophy. 2. No acute intracranial abnormality. Electronically Signed   By: Virgina Norfolk M.D.   On: 04/22/2021 20:55   DG Chest Port 1 View  Result Date: 04/22/2021 CLINICAL DATA:  Altered mental status. EXAM: PORTABLE CHEST 1 VIEW COMPARISON:  September 08, 2019 FINDINGS: Low lung volumes are seen. The  cardiac silhouette is borderline in size. A radiopaque fusion plate and screws are seen overlying the lower cervical spine. Degenerative changes are seen involving the bilateral shoulders. IMPRESSION: Low lung volumes without evidence of acute or active cardiopulmonary disease. Electronically Signed   By: Virgina Norfolk M.D.   On: 04/22/2021 20:36     Subjective: No acute issues or events overnight, tolerating p.o. quite well today denies nausea vomiting diarrhea constipation headache fevers chills chest pain shortness of breath.   Discharge Exam: Vitals:   04/25/21 2153 04/26/21 0602  BP: 131/70 127/73  Pulse: 88 87  Resp: 18 18  Temp: 99  F (37.2 C) 98.1 F (36.7 C)  SpO2: 99% 98%   Vitals:   04/25/21 0508 04/25/21 1400 04/25/21 2153 04/26/21 0602  BP: 121/70 (!) 150/65 131/70 127/73  Pulse: 98 88 88 87  Resp: 17 20 18 18   Temp:  (!) 97.2 F (36.2 C) 99 F (37.2 C) 98.1 F (36.7 C)  TempSrc:  Oral Oral Oral  SpO2: 99%  99% 98%  Weight:      Height:        General exam: Appears calm and comfortable  Respiratory system: Clear to auscultation. Respiratory effort normal. Cardiovascular system: S1 & S2 heard, RRR. No JVD, murmurs, rubs, gallops or clicks. No pedal edema. Gastrointestinal system: Abdomen is nondistended, soft and nontender. No organomegaly or masses felt. Normal bowel sounds heard. Central nervous system: Alert and oriented. No focal neurological deficits. Extremities: Symmetric 5 x 5 power BUE; lower extremities 1/5 Skin: No rashes, lesions; sacral/heel ulcers (chronic) noted per nursing documentation Psychiatry: Judgement and insight appear normal. Mood & affect appropriate.     The results of significant diagnostics from this hospitalization (including imaging, microbiology, ancillary and laboratory) are listed below for reference.     Microbiology: Recent Results (from the past 240 hour(s))  Resp Panel by RT-PCR (Flu A&B, Covid)     Status: None    Collection Time: 04/22/21  9:36 PM   Specimen: Nasopharyngeal(NP) swabs in vial transport medium  Result Value Ref Range Status   SARS Coronavirus 2 by RT PCR NEGATIVE NEGATIVE Final    Comment: (NOTE) SARS-CoV-2 target nucleic acids are NOT DETECTED.  The SARS-CoV-2 RNA is generally detectable in upper respiratory specimens during the acute phase of infection. The lowest concentration of SARS-CoV-2 viral copies this assay can detect is 138 copies/mL. A negative result does not preclude SARS-Cov-2 infection and should not be used as the sole basis for treatment or other patient management decisions. A negative result may occur with  improper specimen collection/handling, submission of specimen other than nasopharyngeal swab, presence of viral mutation(s) within the areas targeted by this assay, and inadequate number of viral copies(<138 copies/mL). A negative result must be combined with clinical observations, patient history, and epidemiological information. The expected result is Negative.  Fact Sheet for Patients:  EntrepreneurPulse.com.au  Fact Sheet for Healthcare Providers:  IncredibleEmployment.be  This test is no t yet approved or cleared by the [place] FDA and  has been authorized for detection and/or diagnosis of SARS-CoV-2 by FDA under an Emergency Use Authorization (EUA). This EUA will remain  in effect (meaning this test can be used) for the duration of the COVID-19 declaration under Section 564(b)(1) of the Act, 21 U.S.C.section 360bbb-3(b)(1), unless the authorization is terminated  or revoked sooner.       Influenza A by PCR NEGATIVE NEGATIVE Final   Influenza B by PCR NEGATIVE NEGATIVE Final    Comment: (NOTE) The Xpert Xpress SARS-CoV-2/FLU/RSV plus assay is intended as an aid in the diagnosis of influenza from Nasopharyngeal swab specimens and should not be used as a sole basis for treatment. Nasal washings  and aspirates are unacceptable for Xpert Xpress SARS-CoV-2/FLU/RSV testing.  Fact Sheet for Patients: EntrepreneurPulse.com.au  Fact Sheet for Healthcare Providers: IncredibleEmployment.be  This test is not yet approved or cleared by the [place] FDA and has been authorized for detection and/or diagnosis of SARS-CoV-2 by FDA under an Emergency Use Authorization (EUA). This EUA will remain in effect (meaning this test can be used) for the duration of the  COVID-19 declaration under Section 564(b)(1) of the Act, 21 U.S.C. section 360bbb-3(b)(1), unless the authorization is terminated or revoked.  Performed at [facility], [place] [address] [place], [place] [address]   Culture, blood (Routine X 2) w Reflex to ID Panel     Status: None (Preliminary result)   Collection Time: 04/22/21  9:50 PM   Specimen: BLOOD  Result Value Ref Range Status   Specimen Description   Final    BLOOD SITE NOT SPECIFIED Performed at [place] [address] [place], [place] [address]    Special Requests   Final    BOTTLES DRAWN AEROBIC AND ANAEROBIC Blood Culture adequate volume Performed at [place] [address], [place], [place] [address]    Culture   Final    NO GROWTH 4 DAYS Performed at [facility], [place] [address], [place], [place] [address]    Report Status PENDING  Incomplete  Urine Culture     Status: Abnormal   Collection Time: 04/22/21 10:15 PM   Specimen: Urine, Clean Catch  Result Value Ref Range Status   Specimen Description   Final    URINE, CLEAN CATCH Performed at [facility], [place] [address], [place], [place] [address]    Special Requests   Final    NONE Performed at [facility], [place] [address] [place], [place] [address]    Culture MULTIPLE SPECIES PRESENT, SUGGEST RECOLLECTION (A)  Final   Report Status 04/24/2021 FINAL   Final  Culture, blood (Routine X 2) w Reflex to ID Panel     Status: None (Preliminary result)   Collection Time: 04/23/21  1:00 AM   Specimen: BLOOD  Result Value Ref Range Status   Specimen Description   Final    BLOOD BLOOD LEFT HAND Performed at [place] [address], [place], [place] [address]    Special Requests   Final    BOTTLES DRAWN AEROBIC AND ANAEROBIC Blood Culture adequate volume Performed at [facility] [address], [place], [place] [address]    Culture   Final    NO GROWTH 3 DAYS Performed at [facility], [place] [address] [place], [place] [address]    Report Status PENDING  Incomplete  MRSA Next Gen by PCR, Nasal     Status: None   Collection Time: 04/25/21  2:35 PM   Specimen: Nasal Mucosa; Nasal Swab  Result Value Ref Range Status   MRSA by PCR Next Gen NOT DETECTED NOT DETECTED Final    Comment: (NOTE) The GeneXpert MRSA Assay (FDA approved for NASAL specimens only), is one component of a comprehensive MRSA colonization surveillance program. It is not intended to diagnose MRSA infection nor to guide or monitor treatment for MRSA infections. Test performance is not FDA approved in patients less than 61 years old. Performed at [facility], [place] [address], [place], [place] [address]      Labs: BNP (last 3 results) No results for input(s): BNP in the last 8760 hours. Basic Metabolic Panel: Recent Labs  Lab 04/23/21 0404 04/23/21 0726 04/24/21 0436 04/25/21 0425 04/26/21 0503  NA 120* 121* 124* 129* 130*  K 4.4 4.1 3.8 3.6 3.4*  CL 82* 84* 89* 95* 96*  CO2 29 29 27 27 27   GLUCOSE 230* 202* 172* 182* 129*  BUN 23 26* 20 12 9   CREATININE 0.91 0.99 0.59 0.62 0.47  CALCIUM  8.4* 8.2* 8.5* 8.3* 8.2*   Liver Function Tests: Recent Labs  Lab 04/22/21 2136  AST 19  ALT 17  ALKPHOS 59  BILITOT 0.7  PROT 6.9  ALBUMIN 3.0*   No results for input(s): LIPASE, AMYLASE in the  last 168 hours. No results for input(s): AMMONIA in the last 168 hours. CBC: Recent Labs  Lab 04/22/21 2136 04/23/21 0404  WBC 17.8* 17.6*  NEUTROABS 12.7*  --   HGB 10.2* 9.4*  HCT 30.4* 28.4*  MCV 87.1 87.1  PLT 294 317   Cardiac Enzymes: No results for input(s): CKTOTAL, CKMB, CKMBINDEX, TROPONINI in the last 168 hours. BNP: Invalid input(s): POCBNP CBG: Recent Labs  Lab 04/25/21 1135 04/25/21 1611 04/25/21 2150 04/26/21 0719 04/26/21 1208  GLUCAP 188* 171* 177* 151* 137*   D-Dimer No results for input(s): DDIMER in the last 72 hours. Hgb A1c Recent Labs    04/24/21 0436  HGBA1C 8.3*   Lipid Profile No results for input(s): CHOL, HDL, LDLCALC, TRIG, CHOLHDL, LDLDIRECT in the last 72 hours. Thyroid function studies No results for input(s): TSH, T4TOTAL, T3FREE, THYROIDAB in the last 72 hours.  Invalid input(s): FREET3 Anemia work up No results for input(s): VITAMINB12, FOLATE, FERRITIN, TIBC, IRON, RETICCTPCT in the last 72 hours. Urinalysis    Component Value Date/Time   COLORURINE YELLOW 04/22/2021 2136   APPEARANCEUR TURBID (A) 04/22/2021 2136   LABSPEC 1.012 04/22/2021 2136   PHURINE 5.0 04/22/2021 2136   GLUCOSEU NEGATIVE 04/22/2021 2136   HGBUR SMALL (A) 04/22/2021 2136   BILIRUBINUR NEGATIVE 04/22/2021 2136   KETONESUR NEGATIVE 04/22/2021 2136   PROTEINUR NEGATIVE 04/22/2021 2136   NITRITE NEGATIVE 04/22/2021 2136   LEUKOCYTESUR LARGE (A) 04/22/2021 2136   Sepsis Labs Invalid input(s): PROCALCITONIN,  WBC,  LACTICIDVEN Microbiology Recent Results (from the past 240 hour(s))  Resp Panel by RT-PCR (Flu A&B, Covid)     Status: None   Collection Time: 04/22/21  9:36 PM   Specimen: Nasopharyngeal(NP) swabs in vial transport medium  Result Value Ref Range Status   SARS Coronavirus 2 by RT PCR NEGATIVE NEGATIVE Final    Comment: (NOTE) SARS-CoV-2 target nucleic acids are NOT DETECTED.  The SARS-CoV-2 RNA is generally detectable in upper  respiratory specimens during the acute phase of infection. The lowest concentration of SARS-CoV-2 viral copies this assay can detect is 138 copies/mL. A negative result does not preclude SARS-Cov-2 infection and should not be used as the sole basis for treatment or other patient management decisions. A negative result may occur with  improper specimen collection/handling, submission of specimen other than nasopharyngeal swab, presence of viral mutation(s) within the areas targeted by this assay, and inadequate number of viral copies(<138 copies/mL). A negative result must be combined with clinical observations, patient history, and epidemiological information. The expected result is Negative.  Fact Sheet for Patients:  EntrepreneurPulse.com.au  Fact Sheet for Healthcare Providers:  IncredibleEmployment.be  This test is no t yet approved or cleared by the [place] FDA and  has been authorized for detection and/or diagnosis of SARS-CoV-2 by FDA under an Emergency Use Authorization (EUA). This EUA will remain  in effect (meaning this test can be used) for the duration of the COVID-19 declaration under Section 564(b)(1) of the Act, 21 U.S.C.section 360bbb-3(b)(1), unless the authorization is terminated  or revoked sooner.       Influenza A by PCR NEGATIVE NEGATIVE Final   Influenza B by PCR NEGATIVE NEGATIVE Final    Comment: (NOTE) The Xpert Xpress  SARS-CoV-2/FLU/RSV plus assay is intended as an aid in the diagnosis of influenza from Nasopharyngeal swab specimens and should not be used as a sole basis for treatment. Nasal washings and aspirates are unacceptable for Xpert Xpress SARS-CoV-2/FLU/RSV testing.  Fact Sheet for Patients: EntrepreneurPulse.com.au  Fact Sheet for Healthcare Providers: IncredibleEmployment.be  This test is not yet approved or cleared by the [place] FDA and has been  authorized for detection and/or diagnosis of SARS-CoV-2 by FDA under an Emergency Use Authorization (EUA). This EUA will remain in effect (meaning this test can be used) for the duration of the COVID-19 declaration under Section 564(b)(1) of the Act, 21 U.S.C. section 360bbb-3(b)(1), unless the authorization is terminated or revoked.  Performed at [facility], [place] [address] [place], [place] [address]   Culture, blood (Routine X 2) w Reflex to ID Panel     Status: None (Preliminary result)   Collection Time: 04/22/21  9:50 PM   Specimen: BLOOD  Result Value Ref Range Status   Specimen Description   Final    BLOOD SITE NOT SPECIFIED Performed at [facility] [address], [place], [place] [address]    Special Requests   Final    BOTTLES DRAWN AEROBIC AND ANAEROBIC Blood Culture adequate volume Performed at [facility] [address] [place], [place] [address]    Culture   Final    NO GROWTH 4 DAYS Performed at [facility], [place] [address], [place], [place] [address]    Report Status PENDING  Incomplete  Urine Culture     Status: Abnormal   Collection Time: 04/22/21 10:15 PM   Specimen: Urine, Clean Catch  Result Value Ref Range Status   Specimen Description   Final    URINE, CLEAN CATCH Performed at [facility], [place] [address], [place], [place] [address]    Special Requests   Final    NONE Performed at [facility], [place] [address] [place], [place] [address]    Culture MULTIPLE SPECIES PRESENT, SUGGEST RECOLLECTION (A)  Final   Report Status 04/24/2021 FINAL  Final  Culture, blood (Routine X 2) w Reflex to ID Panel     Status: None (Preliminary result)   Collection Time: 04/23/21  1:00 AM   Specimen: BLOOD  Result Value Ref Range Status   Specimen Description   Final    BLOOD BLOOD LEFT HAND Performed at [facility] [address],  [place], [place] [address]    Special Requests   Final    BOTTLES DRAWN AEROBIC AND ANAEROBIC Blood Culture adequate volume Performed at [facility] [address], [place], [place] [address]    Culture   Final    NO GROWTH 3 DAYS Performed at [facility], [place] [address] [place], [place] [address]    Report Status PENDING  Incomplete  MRSA Next Gen by PCR, Nasal     Status: None   Collection Time: 04/25/21  2:35 PM   Specimen: Nasal Mucosa; Nasal Swab  Result Value Ref Range Status   MRSA by PCR Next Gen NOT DETECTED NOT DETECTED Final    Comment: (NOTE) The GeneXpert MRSA Assay (FDA approved for NASAL specimens only), is one component of a comprehensive MRSA colonization surveillance program. It is not intended to diagnose MRSA infection nor to guide or monitor treatment for MRSA infections. Test performance is not FDA approved in patients less than 2 years  old. Performed at [facility], [place] [address] [place], [place] [address]      Time coordinating discharge: Over 30 minutes  SIGNED:   [name], DO [facility] 04/26/2021, 1:15 PM Pager   If 7PM-7AM, please contact night-coverage [email or web address]

## 2021-04-26 NOTE — TOC Transition Note (Signed)
Transition of Care [facility]) - CM/SW Discharge Note   Patient Details  Name: Natalie Macdonald MRN: [id number] Date of Birth: 1956-05-19  [facility] CM/SW Contact:  Leeroy Cha, RN Phone Number: 04/26/2021, 2:02 PM   Clinical Narrative:    PATIENT DISCHARGED TO RETURN HOME TO FAMILY. Refused any hhc states that her daughter is the caregiver.  Ptar called at 1350 for transport to home.  DC packet given to ward clerk.   Final next level of care: Home/Self Care Barriers to Discharge: Barriers Resolved   Patient Goals and CMS Choice Patient states their goals for this hospitalization and ongoing recovery are:: i will go home with my duaghter who takes care of me. CMS Medicare.gov Compare Post Acute Care list provided to:: Patient Choice offered to / list presented to : Patient  Discharge Placement                       Discharge Plan and Services   Discharge Planning Services: CM Consult Post Acute Care Choice: Home Health, Durable Medical Equipment          DME Arranged: Hospital bed DME Agency: [employer] Date DME Agency Contacted: 04/26/21   Representative spoke with at DME Agency: zack blank and Geneviene HH Arranged: RN, PT, OT, Nurse's Aide, Patient Refused [place]       Representative spoke with at [place]: refused by patient  Social Determinants of Health (SDOH) Interventions     Readmission Risk Interventions No flowsheet data found.

## 2021-04-26 NOTE — Plan of Care (Signed)
Patient discharged home.

## 2021-04-27 DIAGNOSIS — G8220 Paraplegia, unspecified: Secondary | ICD-10-CM | POA: Diagnosis not present

## 2021-04-27 DIAGNOSIS — M961 Postlaminectomy syndrome, not elsewhere classified: Secondary | ICD-10-CM | POA: Diagnosis not present

## 2021-04-27 DIAGNOSIS — G8221 Paraplegia, complete: Secondary | ICD-10-CM | POA: Diagnosis not present

## 2021-04-27 DIAGNOSIS — M542 Cervicalgia: Secondary | ICD-10-CM | POA: Diagnosis not present

## 2021-04-27 DIAGNOSIS — Z7401 Bed confinement status: Secondary | ICD-10-CM | POA: Diagnosis not present

## 2021-04-27 DIAGNOSIS — G890 Central pain syndrome: Secondary | ICD-10-CM | POA: Diagnosis not present

## 2021-04-27 DIAGNOSIS — M25552 Pain in left hip: Secondary | ICD-10-CM | POA: Diagnosis not present

## 2021-04-27 LAB — CULTURE, BLOOD (ROUTINE X 2)
Culture: NO GROWTH
Special Requests: ADEQUATE

## 2021-04-28 LAB — CULTURE, BLOOD (ROUTINE X 2)
Culture: NO GROWTH
Special Requests: ADEQUATE

## 2021-05-02 DIAGNOSIS — E1165 Type 2 diabetes mellitus with hyperglycemia: Secondary | ICD-10-CM | POA: Diagnosis not present

## 2021-05-12 DIAGNOSIS — M542 Cervicalgia: Secondary | ICD-10-CM | POA: Diagnosis not present

## 2021-05-12 DIAGNOSIS — M25552 Pain in left hip: Secondary | ICD-10-CM | POA: Diagnosis not present

## 2021-05-12 DIAGNOSIS — G894 Chronic pain syndrome: Secondary | ICD-10-CM | POA: Diagnosis not present

## 2021-05-12 DIAGNOSIS — M961 Postlaminectomy syndrome, not elsewhere classified: Secondary | ICD-10-CM | POA: Diagnosis not present

## 2021-05-12 DIAGNOSIS — G8221 Paraplegia, complete: Secondary | ICD-10-CM | POA: Diagnosis not present

## 2021-05-12 DIAGNOSIS — Z79891 Long term (current) use of opiate analgesic: Secondary | ICD-10-CM | POA: Diagnosis not present

## 2021-05-12 DIAGNOSIS — G890 Central pain syndrome: Secondary | ICD-10-CM | POA: Diagnosis not present

## 2021-05-13 DIAGNOSIS — E1165 Type 2 diabetes mellitus with hyperglycemia: Secondary | ICD-10-CM | POA: Diagnosis not present

## 2021-05-13 DIAGNOSIS — L89159 Pressure ulcer of sacral region, unspecified stage: Secondary | ICD-10-CM | POA: Diagnosis not present

## 2021-05-13 DIAGNOSIS — B3732 Chronic candidiasis of vulva and vagina: Secondary | ICD-10-CM | POA: Diagnosis not present

## 2021-05-13 DIAGNOSIS — N390 Urinary tract infection, site not specified: Secondary | ICD-10-CM | POA: Diagnosis not present

## 2021-05-13 DIAGNOSIS — I10 Essential (primary) hypertension: Secondary | ICD-10-CM | POA: Diagnosis not present

## 2021-05-24 DIAGNOSIS — Z433 Encounter for attention to colostomy: Secondary | ICD-10-CM | POA: Diagnosis not present

## 2021-05-24 DIAGNOSIS — Z936 Other artificial openings of urinary tract status: Secondary | ICD-10-CM | POA: Diagnosis not present

## 2021-05-28 DIAGNOSIS — L89154 Pressure ulcer of sacral region, stage 4: Secondary | ICD-10-CM | POA: Diagnosis not present

## 2021-06-01 DIAGNOSIS — E1165 Type 2 diabetes mellitus with hyperglycemia: Secondary | ICD-10-CM | POA: Diagnosis not present

## 2021-06-28 DIAGNOSIS — N390 Urinary tract infection, site not specified: Secondary | ICD-10-CM | POA: Diagnosis not present

## 2021-07-01 DIAGNOSIS — E1165 Type 2 diabetes mellitus with hyperglycemia: Secondary | ICD-10-CM | POA: Diagnosis not present

## 2021-07-01 DIAGNOSIS — Z0000 Encounter for general adult medical examination without abnormal findings: Secondary | ICD-10-CM | POA: Diagnosis not present

## 2021-07-01 DIAGNOSIS — Z7189 Other specified counseling: Secondary | ICD-10-CM | POA: Diagnosis not present

## 2021-07-01 DIAGNOSIS — I10 Essential (primary) hypertension: Secondary | ICD-10-CM | POA: Diagnosis not present

## 2021-07-01 DIAGNOSIS — Z760 Encounter for issue of repeat prescription: Secondary | ICD-10-CM | POA: Diagnosis not present

## 2021-07-01 DIAGNOSIS — L89159 Pressure ulcer of sacral region, unspecified stage: Secondary | ICD-10-CM | POA: Diagnosis not present

## 2021-07-02 DIAGNOSIS — Z433 Encounter for attention to colostomy: Secondary | ICD-10-CM | POA: Diagnosis not present

## 2021-07-02 DIAGNOSIS — Z936 Other artificial openings of urinary tract status: Secondary | ICD-10-CM | POA: Diagnosis not present

## 2021-07-06 DIAGNOSIS — L89154 Pressure ulcer of sacral region, stage 4: Secondary | ICD-10-CM | POA: Diagnosis not present

## 2021-07-20 DIAGNOSIS — M961 Postlaminectomy syndrome, not elsewhere classified: Secondary | ICD-10-CM | POA: Diagnosis not present

## 2021-07-20 DIAGNOSIS — G890 Central pain syndrome: Secondary | ICD-10-CM | POA: Diagnosis not present

## 2021-07-20 DIAGNOSIS — M542 Cervicalgia: Secondary | ICD-10-CM | POA: Diagnosis not present

## 2021-07-20 DIAGNOSIS — M25552 Pain in left hip: Secondary | ICD-10-CM | POA: Diagnosis not present

## 2021-07-29 DIAGNOSIS — Z433 Encounter for attention to colostomy: Secondary | ICD-10-CM | POA: Diagnosis not present

## 2021-07-29 DIAGNOSIS — L89154 Pressure ulcer of sacral region, stage 4: Secondary | ICD-10-CM | POA: Diagnosis not present

## 2021-07-29 DIAGNOSIS — Z936 Other artificial openings of urinary tract status: Secondary | ICD-10-CM | POA: Diagnosis not present

## 2021-07-31 DIAGNOSIS — E1165 Type 2 diabetes mellitus with hyperglycemia: Secondary | ICD-10-CM | POA: Diagnosis not present

## 2021-08-10 DIAGNOSIS — L89154 Pressure ulcer of sacral region, stage 4: Secondary | ICD-10-CM | POA: Diagnosis not present

## 2021-08-12 DIAGNOSIS — N390 Urinary tract infection, site not specified: Secondary | ICD-10-CM | POA: Diagnosis not present

## 2021-08-12 DIAGNOSIS — Z960 Presence of urogenital implants: Secondary | ICD-10-CM | POA: Diagnosis not present

## 2021-08-12 DIAGNOSIS — L8990 Pressure ulcer of unspecified site, unspecified stage: Secondary | ICD-10-CM | POA: Diagnosis not present

## 2021-08-12 DIAGNOSIS — B3732 Chronic candidiasis of vulva and vagina: Secondary | ICD-10-CM | POA: Diagnosis not present

## 2021-08-26 DIAGNOSIS — G890 Central pain syndrome: Secondary | ICD-10-CM | POA: Diagnosis not present

## 2021-08-26 DIAGNOSIS — M542 Cervicalgia: Secondary | ICD-10-CM | POA: Diagnosis not present

## 2021-08-26 DIAGNOSIS — M961 Postlaminectomy syndrome, not elsewhere classified: Secondary | ICD-10-CM | POA: Diagnosis not present

## 2021-08-26 DIAGNOSIS — M25552 Pain in left hip: Secondary | ICD-10-CM | POA: Diagnosis not present

## 2021-08-26 DIAGNOSIS — G8221 Paraplegia, complete: Secondary | ICD-10-CM | POA: Diagnosis not present

## 2021-08-30 DIAGNOSIS — L89154 Pressure ulcer of sacral region, stage 4: Secondary | ICD-10-CM | POA: Diagnosis not present

## 2021-08-30 DIAGNOSIS — E1165 Type 2 diabetes mellitus with hyperglycemia: Secondary | ICD-10-CM | POA: Diagnosis not present

## 2021-08-31 DIAGNOSIS — Z433 Encounter for attention to colostomy: Secondary | ICD-10-CM | POA: Diagnosis not present

## 2021-08-31 DIAGNOSIS — Z936 Other artificial openings of urinary tract status: Secondary | ICD-10-CM | POA: Diagnosis not present

## 2021-09-17 DIAGNOSIS — N760 Acute vaginitis: Secondary | ICD-10-CM | POA: Diagnosis not present

## 2021-09-17 DIAGNOSIS — R600 Localized edema: Secondary | ICD-10-CM | POA: Diagnosis not present

## 2021-09-17 DIAGNOSIS — Z960 Presence of urogenital implants: Secondary | ICD-10-CM | POA: Diagnosis not present

## 2021-09-17 DIAGNOSIS — E1159 Type 2 diabetes mellitus with other circulatory complications: Secondary | ICD-10-CM | POA: Diagnosis not present

## 2021-09-23 DIAGNOSIS — G8221 Paraplegia, complete: Secondary | ICD-10-CM | POA: Diagnosis not present

## 2021-09-23 DIAGNOSIS — M961 Postlaminectomy syndrome, not elsewhere classified: Secondary | ICD-10-CM | POA: Diagnosis not present

## 2021-09-23 DIAGNOSIS — M25552 Pain in left hip: Secondary | ICD-10-CM | POA: Diagnosis not present

## 2021-09-23 DIAGNOSIS — G890 Central pain syndrome: Secondary | ICD-10-CM | POA: Diagnosis not present

## 2021-09-23 DIAGNOSIS — M542 Cervicalgia: Secondary | ICD-10-CM | POA: Diagnosis not present

## 2021-09-27 DIAGNOSIS — Z433 Encounter for attention to colostomy: Secondary | ICD-10-CM | POA: Diagnosis not present

## 2021-09-27 DIAGNOSIS — L89894 Pressure ulcer of other site, stage 4: Secondary | ICD-10-CM | POA: Diagnosis not present

## 2021-09-27 DIAGNOSIS — L89154 Pressure ulcer of sacral region, stage 4: Secondary | ICD-10-CM | POA: Diagnosis not present

## 2021-09-27 DIAGNOSIS — Z936 Other artificial openings of urinary tract status: Secondary | ICD-10-CM | POA: Diagnosis not present

## 2021-09-29 DIAGNOSIS — E1165 Type 2 diabetes mellitus with hyperglycemia: Secondary | ICD-10-CM | POA: Diagnosis not present

## 2021-10-22 DIAGNOSIS — Z79891 Long term (current) use of opiate analgesic: Secondary | ICD-10-CM | POA: Diagnosis not present

## 2021-10-22 DIAGNOSIS — G894 Chronic pain syndrome: Secondary | ICD-10-CM | POA: Diagnosis not present

## 2021-10-24 DIAGNOSIS — Z433 Encounter for attention to colostomy: Secondary | ICD-10-CM | POA: Diagnosis not present

## 2021-10-25 DIAGNOSIS — L89154 Pressure ulcer of sacral region, stage 4: Secondary | ICD-10-CM | POA: Diagnosis not present

## 2021-10-25 DIAGNOSIS — N761 Subacute and chronic vaginitis: Secondary | ICD-10-CM | POA: Diagnosis not present

## 2021-10-25 DIAGNOSIS — E0865 Diabetes mellitus due to underlying condition with hyperglycemia: Secondary | ICD-10-CM | POA: Diagnosis not present

## 2021-10-25 DIAGNOSIS — E7849 Other hyperlipidemia: Secondary | ICD-10-CM | POA: Diagnosis not present

## 2021-10-28 DIAGNOSIS — M961 Postlaminectomy syndrome, not elsewhere classified: Secondary | ICD-10-CM | POA: Diagnosis not present

## 2021-10-28 DIAGNOSIS — M542 Cervicalgia: Secondary | ICD-10-CM | POA: Diagnosis not present

## 2021-10-28 DIAGNOSIS — G890 Central pain syndrome: Secondary | ICD-10-CM | POA: Diagnosis not present

## 2021-10-28 DIAGNOSIS — M25552 Pain in left hip: Secondary | ICD-10-CM | POA: Diagnosis not present

## 2021-10-29 DIAGNOSIS — I10 Essential (primary) hypertension: Secondary | ICD-10-CM | POA: Diagnosis not present

## 2021-10-29 DIAGNOSIS — D7589 Other specified diseases of blood and blood-forming organs: Secondary | ICD-10-CM | POA: Diagnosis not present

## 2021-10-29 DIAGNOSIS — R600 Localized edema: Secondary | ICD-10-CM | POA: Diagnosis not present

## 2021-10-29 DIAGNOSIS — E876 Hypokalemia: Secondary | ICD-10-CM | POA: Diagnosis not present

## 2021-10-29 DIAGNOSIS — L8990 Pressure ulcer of unspecified site, unspecified stage: Secondary | ICD-10-CM | POA: Diagnosis not present

## 2021-10-29 DIAGNOSIS — E119 Type 2 diabetes mellitus without complications: Secondary | ICD-10-CM | POA: Diagnosis not present

## 2021-10-29 DIAGNOSIS — G894 Chronic pain syndrome: Secondary | ICD-10-CM | POA: Diagnosis not present

## 2021-10-29 DIAGNOSIS — E1165 Type 2 diabetes mellitus with hyperglycemia: Secondary | ICD-10-CM | POA: Diagnosis not present

## 2021-10-31 DIAGNOSIS — Z936 Other artificial openings of urinary tract status: Secondary | ICD-10-CM | POA: Diagnosis not present

## 2021-11-01 DIAGNOSIS — Z936 Other artificial openings of urinary tract status: Secondary | ICD-10-CM | POA: Diagnosis not present

## 2021-11-03 DIAGNOSIS — L8990 Pressure ulcer of unspecified site, unspecified stage: Secondary | ICD-10-CM | POA: Diagnosis not present

## 2021-11-24 DIAGNOSIS — L89154 Pressure ulcer of sacral region, stage 4: Secondary | ICD-10-CM | POA: Diagnosis not present

## 2021-11-25 DIAGNOSIS — Z433 Encounter for attention to colostomy: Secondary | ICD-10-CM | POA: Diagnosis not present

## 2021-11-28 DIAGNOSIS — E1165 Type 2 diabetes mellitus with hyperglycemia: Secondary | ICD-10-CM | POA: Diagnosis not present

## 2021-11-29 DIAGNOSIS — Z936 Other artificial openings of urinary tract status: Secondary | ICD-10-CM | POA: Diagnosis not present

## 2021-12-01 DIAGNOSIS — G8221 Paraplegia, complete: Secondary | ICD-10-CM | POA: Diagnosis not present

## 2021-12-01 DIAGNOSIS — M542 Cervicalgia: Secondary | ICD-10-CM | POA: Diagnosis not present

## 2021-12-01 DIAGNOSIS — M961 Postlaminectomy syndrome, not elsewhere classified: Secondary | ICD-10-CM | POA: Diagnosis not present

## 2021-12-01 DIAGNOSIS — M25552 Pain in left hip: Secondary | ICD-10-CM | POA: Diagnosis not present

## 2021-12-01 DIAGNOSIS — G890 Central pain syndrome: Secondary | ICD-10-CM | POA: Diagnosis not present

## 2021-12-07 DIAGNOSIS — M961 Postlaminectomy syndrome, not elsewhere classified: Secondary | ICD-10-CM | POA: Diagnosis not present

## 2021-12-07 DIAGNOSIS — Z79891 Long term (current) use of opiate analgesic: Secondary | ICD-10-CM | POA: Diagnosis not present

## 2021-12-07 DIAGNOSIS — M25552 Pain in left hip: Secondary | ICD-10-CM | POA: Diagnosis not present

## 2021-12-07 DIAGNOSIS — M542 Cervicalgia: Secondary | ICD-10-CM | POA: Diagnosis not present

## 2021-12-07 DIAGNOSIS — L8990 Pressure ulcer of unspecified site, unspecified stage: Secondary | ICD-10-CM | POA: Diagnosis not present

## 2021-12-07 DIAGNOSIS — G890 Central pain syndrome: Secondary | ICD-10-CM | POA: Diagnosis not present

## 2021-12-07 DIAGNOSIS — G8221 Paraplegia, complete: Secondary | ICD-10-CM | POA: Diagnosis not present

## 2021-12-08 DIAGNOSIS — E1165 Type 2 diabetes mellitus with hyperglycemia: Secondary | ICD-10-CM | POA: Diagnosis not present

## 2021-12-08 DIAGNOSIS — L89159 Pressure ulcer of sacral region, unspecified stage: Secondary | ICD-10-CM | POA: Diagnosis not present

## 2021-12-08 DIAGNOSIS — Z960 Presence of urogenital implants: Secondary | ICD-10-CM | POA: Diagnosis not present

## 2021-12-08 DIAGNOSIS — I10 Essential (primary) hypertension: Secondary | ICD-10-CM | POA: Diagnosis not present

## 2021-12-24 DIAGNOSIS — Z79891 Long term (current) use of opiate analgesic: Secondary | ICD-10-CM | POA: Diagnosis not present

## 2021-12-24 DIAGNOSIS — G894 Chronic pain syndrome: Secondary | ICD-10-CM | POA: Diagnosis not present

## 2021-12-28 DIAGNOSIS — E1165 Type 2 diabetes mellitus with hyperglycemia: Secondary | ICD-10-CM | POA: Diagnosis not present

## 2022-01-06 DIAGNOSIS — L89154 Pressure ulcer of sacral region, stage 4: Secondary | ICD-10-CM | POA: Diagnosis not present

## 2022-01-06 DIAGNOSIS — Z936 Other artificial openings of urinary tract status: Secondary | ICD-10-CM | POA: Diagnosis not present

## 2022-01-06 DIAGNOSIS — Z433 Encounter for attention to colostomy: Secondary | ICD-10-CM | POA: Diagnosis not present

## 2022-01-12 DIAGNOSIS — E1165 Type 2 diabetes mellitus with hyperglycemia: Secondary | ICD-10-CM | POA: Diagnosis not present

## 2022-01-12 DIAGNOSIS — Z960 Presence of urogenital implants: Secondary | ICD-10-CM | POA: Diagnosis not present

## 2022-01-12 DIAGNOSIS — R600 Localized edema: Secondary | ICD-10-CM | POA: Diagnosis not present

## 2022-01-12 DIAGNOSIS — I10 Essential (primary) hypertension: Secondary | ICD-10-CM | POA: Diagnosis not present

## 2022-01-26 DIAGNOSIS — M25552 Pain in left hip: Secondary | ICD-10-CM | POA: Diagnosis not present

## 2022-01-26 DIAGNOSIS — G8921 Chronic pain due to trauma: Secondary | ICD-10-CM | POA: Diagnosis not present

## 2022-01-26 DIAGNOSIS — M961 Postlaminectomy syndrome, not elsewhere classified: Secondary | ICD-10-CM | POA: Diagnosis not present

## 2022-01-26 DIAGNOSIS — G890 Central pain syndrome: Secondary | ICD-10-CM | POA: Diagnosis not present

## 2022-01-26 DIAGNOSIS — M542 Cervicalgia: Secondary | ICD-10-CM | POA: Diagnosis not present

## 2022-01-27 DIAGNOSIS — E1165 Type 2 diabetes mellitus with hyperglycemia: Secondary | ICD-10-CM | POA: Diagnosis not present

## 2022-02-11 DIAGNOSIS — I10 Essential (primary) hypertension: Secondary | ICD-10-CM | POA: Diagnosis not present

## 2022-02-11 DIAGNOSIS — G894 Chronic pain syndrome: Secondary | ICD-10-CM | POA: Diagnosis not present

## 2022-02-11 DIAGNOSIS — M6281 Muscle weakness (generalized): Secondary | ICD-10-CM | POA: Diagnosis not present

## 2022-02-11 DIAGNOSIS — L8990 Pressure ulcer of unspecified site, unspecified stage: Secondary | ICD-10-CM | POA: Diagnosis not present

## 2022-02-14 DIAGNOSIS — Z936 Other artificial openings of urinary tract status: Secondary | ICD-10-CM | POA: Diagnosis not present

## 2022-02-20 DIAGNOSIS — L89154 Pressure ulcer of sacral region, stage 4: Secondary | ICD-10-CM | POA: Diagnosis not present

## 2022-02-26 DIAGNOSIS — E1165 Type 2 diabetes mellitus with hyperglycemia: Secondary | ICD-10-CM | POA: Diagnosis not present

## 2022-03-01 DIAGNOSIS — G8221 Paraplegia, complete: Secondary | ICD-10-CM | POA: Diagnosis not present

## 2022-03-01 DIAGNOSIS — G890 Central pain syndrome: Secondary | ICD-10-CM | POA: Diagnosis not present

## 2022-03-01 DIAGNOSIS — M961 Postlaminectomy syndrome, not elsewhere classified: Secondary | ICD-10-CM | POA: Diagnosis not present

## 2022-03-01 DIAGNOSIS — M25552 Pain in left hip: Secondary | ICD-10-CM | POA: Diagnosis not present

## 2022-03-01 DIAGNOSIS — M542 Cervicalgia: Secondary | ICD-10-CM | POA: Diagnosis not present

## 2022-03-17 DIAGNOSIS — I10 Essential (primary) hypertension: Secondary | ICD-10-CM | POA: Diagnosis not present

## 2022-03-17 DIAGNOSIS — M6281 Muscle weakness (generalized): Secondary | ICD-10-CM | POA: Diagnosis not present

## 2022-03-17 DIAGNOSIS — L8990 Pressure ulcer of unspecified site, unspecified stage: Secondary | ICD-10-CM | POA: Diagnosis not present

## 2022-03-17 DIAGNOSIS — L89154 Pressure ulcer of sacral region, stage 4: Secondary | ICD-10-CM | POA: Diagnosis not present

## 2022-03-17 DIAGNOSIS — G894 Chronic pain syndrome: Secondary | ICD-10-CM | POA: Diagnosis not present

## 2022-03-17 DIAGNOSIS — L89159 Pressure ulcer of sacral region, unspecified stage: Secondary | ICD-10-CM | POA: Diagnosis not present

## 2022-03-18 DIAGNOSIS — Z79891 Long term (current) use of opiate analgesic: Secondary | ICD-10-CM | POA: Diagnosis not present

## 2022-03-18 DIAGNOSIS — G894 Chronic pain syndrome: Secondary | ICD-10-CM | POA: Diagnosis not present

## 2022-03-28 DIAGNOSIS — E1165 Type 2 diabetes mellitus with hyperglycemia: Secondary | ICD-10-CM | POA: Diagnosis not present

## 2022-03-30 DIAGNOSIS — M542 Cervicalgia: Secondary | ICD-10-CM | POA: Diagnosis not present

## 2022-03-30 DIAGNOSIS — M25552 Pain in left hip: Secondary | ICD-10-CM | POA: Diagnosis not present

## 2022-03-30 DIAGNOSIS — G890 Central pain syndrome: Secondary | ICD-10-CM | POA: Diagnosis not present

## 2022-03-30 DIAGNOSIS — M961 Postlaminectomy syndrome, not elsewhere classified: Secondary | ICD-10-CM | POA: Diagnosis not present

## 2022-03-30 DIAGNOSIS — G8221 Paraplegia, complete: Secondary | ICD-10-CM | POA: Diagnosis not present

## 2022-04-05 DIAGNOSIS — L89154 Pressure ulcer of sacral region, stage 4: Secondary | ICD-10-CM | POA: Diagnosis not present

## 2022-04-20 DIAGNOSIS — T7849XA Other allergy, initial encounter: Secondary | ICD-10-CM | POA: Diagnosis not present

## 2022-04-20 DIAGNOSIS — Z960 Presence of urogenital implants: Secondary | ICD-10-CM | POA: Diagnosis not present

## 2022-04-20 DIAGNOSIS — B3732 Chronic candidiasis of vulva and vagina: Secondary | ICD-10-CM | POA: Diagnosis not present

## 2022-04-20 DIAGNOSIS — E1165 Type 2 diabetes mellitus with hyperglycemia: Secondary | ICD-10-CM | POA: Diagnosis not present

## 2022-04-20 DIAGNOSIS — L89159 Pressure ulcer of sacral region, unspecified stage: Secondary | ICD-10-CM | POA: Diagnosis not present

## 2022-04-27 DIAGNOSIS — E1165 Type 2 diabetes mellitus with hyperglycemia: Secondary | ICD-10-CM | POA: Diagnosis not present

## 2022-05-02 DIAGNOSIS — G890 Central pain syndrome: Secondary | ICD-10-CM | POA: Diagnosis not present

## 2022-05-02 DIAGNOSIS — M25552 Pain in left hip: Secondary | ICD-10-CM | POA: Diagnosis not present

## 2022-05-02 DIAGNOSIS — G8221 Paraplegia, complete: Secondary | ICD-10-CM | POA: Diagnosis not present

## 2022-05-02 DIAGNOSIS — M961 Postlaminectomy syndrome, not elsewhere classified: Secondary | ICD-10-CM | POA: Diagnosis not present

## 2022-05-02 DIAGNOSIS — M542 Cervicalgia: Secondary | ICD-10-CM | POA: Diagnosis not present

## 2022-05-09 DIAGNOSIS — Z433 Encounter for attention to colostomy: Secondary | ICD-10-CM | POA: Diagnosis not present

## 2022-05-09 DIAGNOSIS — Z936 Other artificial openings of urinary tract status: Secondary | ICD-10-CM | POA: Diagnosis not present

## 2022-05-10 DIAGNOSIS — L89154 Pressure ulcer of sacral region, stage 4: Secondary | ICD-10-CM | POA: Diagnosis not present

## 2022-05-15 DIAGNOSIS — L89154 Pressure ulcer of sacral region, stage 4: Secondary | ICD-10-CM | POA: Diagnosis not present

## 2022-05-27 DIAGNOSIS — E1165 Type 2 diabetes mellitus with hyperglycemia: Secondary | ICD-10-CM | POA: Diagnosis not present

## 2022-05-30 DIAGNOSIS — M25552 Pain in left hip: Secondary | ICD-10-CM | POA: Diagnosis not present

## 2022-05-30 DIAGNOSIS — G8221 Paraplegia, complete: Secondary | ICD-10-CM | POA: Diagnosis not present

## 2022-05-30 DIAGNOSIS — G890 Central pain syndrome: Secondary | ICD-10-CM | POA: Diagnosis not present

## 2022-05-30 DIAGNOSIS — M961 Postlaminectomy syndrome, not elsewhere classified: Secondary | ICD-10-CM | POA: Diagnosis not present

## 2022-05-30 DIAGNOSIS — M542 Cervicalgia: Secondary | ICD-10-CM | POA: Diagnosis not present

## 2022-06-03 DIAGNOSIS — Z79891 Long term (current) use of opiate analgesic: Secondary | ICD-10-CM | POA: Diagnosis not present

## 2022-06-03 DIAGNOSIS — Z960 Presence of urogenital implants: Secondary | ICD-10-CM | POA: Diagnosis not present

## 2022-06-03 DIAGNOSIS — G894 Chronic pain syndrome: Secondary | ICD-10-CM | POA: Diagnosis not present

## 2022-06-03 DIAGNOSIS — E1165 Type 2 diabetes mellitus with hyperglycemia: Secondary | ICD-10-CM | POA: Diagnosis not present

## 2022-06-03 DIAGNOSIS — B3732 Chronic candidiasis of vulva and vagina: Secondary | ICD-10-CM | POA: Diagnosis not present

## 2022-06-03 DIAGNOSIS — L8990 Pressure ulcer of unspecified site, unspecified stage: Secondary | ICD-10-CM | POA: Diagnosis not present

## 2022-06-26 DIAGNOSIS — E1165 Type 2 diabetes mellitus with hyperglycemia: Secondary | ICD-10-CM | POA: Diagnosis not present

## 2022-06-28 ENCOUNTER — Emergency Department (HOSPITAL_COMMUNITY)
Admission: EM | Admit: 2022-06-28 | Discharge: 2022-06-28 | Disposition: A | Discharge status: Home / Self Care | Payer: Medicare HMO | Attending: Emergency Medicine | Admitting: Emergency Medicine

## 2022-06-28 ENCOUNTER — Emergency Department (HOSPITAL_COMMUNITY): Payer: Medicare HMO

## 2022-06-28 ENCOUNTER — Other Ambulatory Visit: Payer: Self-pay

## 2022-06-28 ENCOUNTER — Encounter (HOSPITAL_COMMUNITY): Payer: Self-pay

## 2022-06-28 DIAGNOSIS — Z1152 Encounter for screening for COVID-19: Secondary | ICD-10-CM | POA: Insufficient documentation

## 2022-06-28 DIAGNOSIS — M19012 Primary osteoarthritis, left shoulder: Secondary | ICD-10-CM | POA: Diagnosis not present

## 2022-06-28 DIAGNOSIS — Z7401 Bed confinement status: Secondary | ICD-10-CM | POA: Diagnosis not present

## 2022-06-28 DIAGNOSIS — Z794 Long term (current) use of insulin: Secondary | ICD-10-CM | POA: Insufficient documentation

## 2022-06-28 DIAGNOSIS — R531 Weakness: Secondary | ICD-10-CM | POA: Diagnosis not present

## 2022-06-28 DIAGNOSIS — N3000 Acute cystitis without hematuria: Secondary | ICD-10-CM | POA: Diagnosis not present

## 2022-06-28 DIAGNOSIS — I10 Essential (primary) hypertension: Secondary | ICD-10-CM | POA: Diagnosis not present

## 2022-06-28 DIAGNOSIS — Z79899 Other long term (current) drug therapy: Secondary | ICD-10-CM | POA: Diagnosis not present

## 2022-06-28 DIAGNOSIS — R58 Hemorrhage, not elsewhere classified: Secondary | ICD-10-CM | POA: Diagnosis not present

## 2022-06-28 DIAGNOSIS — I517 Cardiomegaly: Secondary | ICD-10-CM | POA: Diagnosis not present

## 2022-06-28 DIAGNOSIS — Z981 Arthrodesis status: Secondary | ICD-10-CM | POA: Diagnosis not present

## 2022-06-28 DIAGNOSIS — Z743 Need for continuous supervision: Secondary | ICD-10-CM | POA: Diagnosis not present

## 2022-06-28 DIAGNOSIS — Z91040 Latex allergy status: Secondary | ICD-10-CM | POA: Diagnosis not present

## 2022-06-28 DIAGNOSIS — R509 Fever, unspecified: Secondary | ICD-10-CM | POA: Diagnosis not present

## 2022-06-28 DIAGNOSIS — R5383 Other fatigue: Secondary | ICD-10-CM | POA: Diagnosis not present

## 2022-06-28 LAB — CBC WITH DIFFERENTIAL/PLATELET
Abs Immature Granulocytes: 0.03 10*3/uL (ref 0.00–0.07)
Basophils Absolute: 0 10*3/uL (ref 0.0–0.1)
Basophils Relative: 0 %
Eosinophils Absolute: 0.2 10*3/uL (ref 0.0–0.5)
Eosinophils Relative: 2 %
HCT: 37.8 % (ref 36.0–46.0)
Hemoglobin: 11.7 g/dL — ABNORMAL LOW (ref 12.0–15.0)
Immature Granulocytes: 0 %
Lymphocytes Relative: 34 %
Lymphs Abs: 4.1 10*3/uL — ABNORMAL HIGH (ref 0.7–4.0)
MCH: 29.4 pg (ref 26.0–34.0)
MCHC: 31 g/dL (ref 30.0–36.0)
MCV: 95 fL (ref 80.0–100.0)
Monocytes Absolute: 0.9 10*3/uL (ref 0.1–1.0)
Monocytes Relative: 7 %
Neutro Abs: 6.8 10*3/uL (ref 1.7–7.7)
Neutrophils Relative %: 57 %
Platelets: 267 10*3/uL (ref 150–400)
RBC: 3.98 MIL/uL (ref 3.87–5.11)
RDW: 13.2 % (ref 11.5–15.5)
WBC: 12.1 10*3/uL — ABNORMAL HIGH (ref 4.0–10.5)
nRBC: 0 % (ref 0.0–0.2)

## 2022-06-28 LAB — CBG MONITORING, ED: Glucose-Capillary: 179 mg/dL — ABNORMAL HIGH (ref 70–99)

## 2022-06-28 LAB — URINALYSIS, ROUTINE W REFLEX MICROSCOPIC
Glucose, UA: NEGATIVE mg/dL
Hgb urine dipstick: NEGATIVE
Ketones, ur: 5 mg/dL — AB
Nitrite: NEGATIVE
Protein, ur: 100 mg/dL — AB
Specific Gravity, Urine: 1.027 (ref 1.005–1.030)
WBC, UA: >50 WBC/hpf — ABNORMAL HIGH (ref 0–5)
pH: 5 (ref 5.0–8.0)

## 2022-06-28 LAB — COMPREHENSIVE METABOLIC PANEL
ALT: 26 U/L (ref 0–44)
AST: 30 U/L (ref 15–41)
Albumin: 3.4 g/dL — ABNORMAL LOW (ref 3.5–5.0)
Alkaline Phosphatase: 61 U/L (ref 38–126)
Anion gap: 10 (ref 5–15)
BUN: 15 mg/dL (ref 8–23)
CO2: 23 mmol/L (ref 22–32)
Calcium: 8.8 mg/dL — ABNORMAL LOW (ref 8.9–10.3)
Chloride: 100 mmol/L (ref 98–111)
Creatinine, Ser: 1.12 mg/dL — ABNORMAL HIGH (ref 0.44–1.00)
GFR, Estimated: 54 mL/min — ABNORMAL LOW (ref >60)
Glucose, Bld: 195 mg/dL — ABNORMAL HIGH (ref 70–99)
Potassium: 3.9 mmol/L (ref 3.5–5.1)
Sodium: 133 mmol/L — ABNORMAL LOW (ref 135–145)
Total Bilirubin: 0.6 mg/dL (ref 0.3–1.2)
Total Protein: 6.9 g/dL (ref 6.5–8.1)

## 2022-06-28 LAB — LACTIC ACID, PLASMA
Lactic Acid, Venous: 3.6 mmol/L (ref 0.5–1.9)
Lactic Acid, Venous: 2.9 mmol/L (ref 0.5–1.9)

## 2022-06-28 LAB — RESP PANEL BY RT-PCR (RSV, FLU A&B, COVID)  RVPGX2
Influenza A by PCR: NEGATIVE
Influenza B by PCR: NEGATIVE
Resp Syncytial Virus by PCR: NEGATIVE
SARS Coronavirus 2 by RT PCR: NEGATIVE

## 2022-06-28 LAB — APTT: aPTT: 33 seconds (ref 24–36)

## 2022-06-28 LAB — PROTIME-INR
INR: 1.1 (ref 0.8–1.2)
Prothrombin Time: 14.2 seconds (ref 11.4–15.2)

## 2022-06-28 MED ORDER — SULFAMETHOXAZOLE-TRIMETHOPRIM 800-160 MG PO TABS: 1.0000 | ORAL_TABLET | Freq: Two times a day (BID) | ORAL | 0 refills | Status: DC

## 2022-06-28 MED ORDER — CEFPODOXIME PROXETIL 100 MG PO TABS: 100.0000 mg | ORAL_TABLET | Freq: Two times a day (BID) | ORAL | 0 refills | Status: AC

## 2022-06-28 MED ORDER — FLUCONAZOLE 150 MG PO TABS
150.0000 mg | ORAL_TABLET | Freq: Once | ORAL | Status: AC
  Administered 2022-06-28: 150 mg via ORAL
  Filled 2022-06-28: qty 1

## 2022-06-28 MED ORDER — LACTATED RINGERS IV BOLUS
1000.0000 mL | Freq: Once | INTRAVENOUS | Status: AC
  Administered 2022-06-28: 1000 mL via INTRAVENOUS

## 2022-06-28 MED ORDER — SODIUM CHLORIDE 0.9 % IV SOLN
2.0000 g | Freq: Once | INTRAVENOUS | Status: AC
  Administered 2022-06-28: 2 g via INTRAVENOUS
  Filled 2022-06-28: qty 20

## 2022-06-28 NOTE — ED Triage Notes (Signed)
Family states that they think she has a UTI, states that he urine is dark colored coming from her foley, they also states that she has wounds to her bottom.

## 2022-06-28 NOTE — Discharge Instructions (Addendum)
As we discussed at bedside you had signs that you could have a serious infection.  Please return for worsening of your symptoms.  I prescribed you an antibiotic.  Please let your family doctor know so they can follow you up in the office.

## 2022-06-28 NOTE — ED Provider Notes (Signed)
[place] DEPT Provider Note   CSN: [id number] Arrival date & time: 06/28/22  1753     History  Chief Complaint  Patient presents with   Fatigue   Weakness    Natalie Macdonald is a 66 y.o. female.  Natalie Macdonald with a chief complaint of not feeling well.  Is been going on for about a week now.  Says that she aches mostly in her shoulders and her hips.  She denies fever denies cough congestion denies abdominal pain denies nausea vomiting or diarrhea.  Denies urinary symptoms.   Weakness      Home Medications Prior to Admission medications   Medication Sig Start Date End Date Taking? Authorizing Provider  cefpodoxime (VANTIN) 100 MG tablet Take 1 tablet (100 mg total) by mouth 2 (two) times daily for 10 days. 06/28/22 07/08/22 Yes Deno Etienne, DO  amitriptyline (ELAVIL) 100 MG tablet Take 100 mg by mouth at bedtime.     [provider]  amLODipine-benazepril (LOTREL) 5-20 MG capsule Take 1 capsule by mouth daily. 04/26/21 04/26/22  [name], MD  APIDRA 100 UNIT/ML injection Inject 18-25 Units into the skin 3 (three) times daily with meals. Sliding Scale 6/Natalie/19   [provider]  Calcium Carbonate (CALCIUM 600 PO) Take 600 mg by mouth daily.    [provider]  carvedilol (COREG) 25 MG tablet Take 25 mg by mouth 2 (two) times daily with a meal.    [provider]  cholecalciferol (VITAMIN D3) 25 MCG (1000 UNIT) tablet Take 1,000 Units by mouth daily.    [provider]  CRANBERRY PO Take 2 capsules by mouth daily.    [provider]  cyclobenzaprine (FLEXERIL) 10 MG tablet Take 10 mg by mouth at bedtime.    [provider]  diclofenac Sodium (VOLTAREN) 1 % GEL Apply 2 g topically daily as needed (pain).    [provider]  fluticasone (FLONASE) 50 MCG/ACT nasal spray Place 2 sprays into both nostrils daily as needed for allergies.     [provider]  gabapentin  (NEURONTIN) 300 MG capsule Take 300 mg by mouth 4 (four) times daily.    [provider]  hydrOXYzine (ATARAX/VISTARIL) 10 MG tablet Take 10 mg by mouth at bedtime. 08/29/19   [provider]  levothyroxine (SYNTHROID) 112 MCG tablet Take 1.5 tablets (168 mcg total) by mouth daily before breakfast. 09/09/19   Mariel Aloe, MD  montelukast (SINGULAIR) 10 MG tablet Take 10 mg by mouth at bedtime.    [provider]  Multiple Vitamin (MULTIVITAMIN) tablet Take 1 tablet by mouth daily.    [provider]  Oxycodone HCl 20 MG TABS Take 1 tablet by mouth 5 (five) times daily. 09/01/19   [provider]  pantoprazole (PROTONIX) 40 MG tablet Take 40 mg by mouth daily.    [provider]  pravastatin (PRAVACHOL) 20 MG tablet Take 20 mg by mouth daily.    [provider]  Probiotic Product (PROBIOTIC PO) Take 1 capsule by mouth daily.    [provider]  Pyridoxine HCl (VITAMIN B-6) 250 MG tablet Take 250 mg by mouth daily.    [provider]  silver sulfADIAZINE (SILVADENE) 1 % cream Apply 1 application topically 2 (two) times daily.  01/15/18   [provider]  TOUJEO SOLOSTAR 300 UNIT/ML SOPN Inject 25-35 Units into the skin daily. Sliding Scale. 10/30/17   [provider]  vitamin B-12 (CYANOCOBALAMIN)  1000 MCG tablet Take 1,000 mcg by mouth daily.    [provider]      Allergies    Grapefruit bioflavonoid complex, Latex, Procaine, Procaine, Grapefruit flavor [flavoring agent], Novolog [insulin aspart], and Penicillins    Review of Systems   Review of Systems  Neurological:  Positive for weakness.    Physical Exam Updated Vital Signs BP 100/72   Pulse 81   Temp 98.8 Macdonald (37.1 C) (Oral)   Resp 17   Ht '5\' 6"'$  (1.676 m)   Wt 117.7 kg   LMP  (LMP Unknown)   SpO2 93%   BMI 41.88 kg/m  Physical Exam Vitals and nursing note reviewed.  Constitutional:      General: She is not in acute  distress.    Appearance: She is well-developed. She is not diaphoretic.     Comments: Chronically ill-appearing.  HENT:     Head: Normocephalic and atraumatic.  Eyes:     Pupils: Pupils are equal, round, and reactive to light.  Cardiovascular:     Rate and Rhythm: Normal rate and regular rhythm.     Heart sounds: No murmur heard.    No friction rub. No gallop.  Pulmonary:     Effort: Pulmonary effort is normal.     Breath sounds: No wheezing or rales.  Abdominal:     General: There is no distension.     Palpations: Abdomen is soft.     Tenderness: There is no abdominal tenderness.  Musculoskeletal:        General: No tenderness.     Cervical back: Normal range of motion and neck supple.     Comments: Muscle wasting diffusely.  Contractures of the left upper extremity.  Skin:    General: Skin is warm and dry.  Neurological:     Mental Status: She is alert and oriented to person, place, and time.  Psychiatric:        Behavior: Behavior normal.     ED Results / Procedures / Treatments   Labs (all labs ordered are listed, but only abnormal results are displayed) Labs Reviewed  LACTIC ACID, PLASMA - Abnormal; Notable for the following components:      Result Value   Lactic Acid, Venous 3.6 (*)    All other components within normal limits  COMPREHENSIVE METABOLIC PANEL - Abnormal; Notable for the following components:   Sodium 133 (*)    Glucose, Bld 195 (*)    Creatinine, Ser 1.12 (*)    Calcium 8.8 (*)    Albumin 3.4 (*)    GFR, Estimated 54 (*)    All other components within normal limits  CBC WITH DIFFERENTIAL/PLATELET - Abnormal; Notable for the following components:   WBC 12.1 (*)    Hemoglobin 11.7 (*)    Lymphs Abs 4.1 (*)    All other components within normal limits  URINALYSIS, ROUTINE W REFLEX MICROSCOPIC - Abnormal; Notable for the following components:   APPearance CLOUDY (*)    Bilirubin Urine SMALL (*)    Ketones, ur 5 (*)    Protein, ur 100 (*)     Leukocytes,Ua MODERATE (*)    WBC, UA >50 (*)    Bacteria, UA MANY (*)    All other components within normal limits  CBG MONITORING, ED - Abnormal; Notable for the following components:   Glucose-Capillary 179 (*)    All other components within normal limits  RESP PANEL BY RT-PCR (RSV, FLU A&B, COVID)  RVPGX2  CULTURE,  BLOOD (ROUTINE X 2)  CULTURE, BLOOD (ROUTINE X 2)  URINE CULTURE  PROTIME-INR  APTT  LACTIC ACID, PLASMA    EKG EKG Interpretation  Date/Time:  Wednesday June 28 2022 18:18:54 EST Ventricular Rate:  88 PR Interval:  162 QRS Duration: 160 QT Interval:  442 QTC Calculation: 535 R Axis:   28 Text Interpretation: Sinus rhythm Right bundle branch block No significant change since last tracing Confirmed by Deno Etienne [phone]) on 06/28/2022 8:25:48 PM  Radiology DG Chest Port 1 View  Result Date: 06/28/2022 CLINICAL DATA:  Possible sepsis EXAM: PORTABLE CHEST 1 VIEW COMPARISON:  04/22/2021 FINDINGS: Transverse diameter of heart is increased. There are no signs of pulmonary edema or focal pulmonary consolidation. There is no pleural effusion or pneumothorax. Degenerative changes are noted in left shoulder. There is previous surgical fusion in cervical spine. IMPRESSION: Cardiomegaly. There are no signs of pulmonary edema or focal pulmonary consolidation. Electronically Signed   By: Elmer Picker M.D.   On: 06/28/2022 19:Natalie    Procedures Procedures    Medications Ordered in ED Medications  lactated ringers bolus 1,000 mL (0 mLs Intravenous Stopped 06/28/22 2151)  cefTRIAXone (ROCEPHIN) 2 g in sodium chloride 0.9 % 100 mL IVPB (2 g Intravenous New Bag/Given 06/28/22 2151)  fluconazole (DIFLUCAN) tablet 150 mg (150 mg Oral Given 06/28/22 2151)    ED Course/ Medical Decision Making/ A&P                           Medical Decision Making Amount and/or Complexity of Data Reviewed Labs: ordered. Radiology: ordered. ECG/medicine tests:  ordered.  Risk Prescription drug management.   66 yo Macdonald with a chief complaint of not feeling well.  This been going on for the past week.  There is some concern that it might be a urinary tract infection.  Patient has an indwelling Foley catheter.  When seen by her family doctor per her daughter and was started on antifungal medication.  Patient denies any obvious infectious symptoms.  Will obtain a chest x-ray UA COVID and flu testing.  Check blood work.  Patient unfortunately has a history of left-sided weakness that did not improve with a neurosurgical procedure and is essentially bedbound has a permanent indwelling Foley catheter as well as a colostomy.  Patient's UA is concerning for possible urinary tract infection.  Has a lactate of 3.6.  Will give a dose of Rocephin here.  I reviewed the patient's micro reports, has had mostly pansensitive E. coli.  COVID and flu testing are negative.  Patient is feeling much better after bolus of IV fluids and would like to go home.  I discussed with her her results and my concern about possible severe infection.  She was given a bolus dose of Rocephin here.  She understands results and would like to go home.  She agrees to return for any worsening.  The patients results and plan were reviewed and discussed.   Any x-rays performed were independently reviewed by myself.   Differential diagnosis were considered with the presenting HPI.  Medications  lactated ringers bolus 1,000 mL (0 mLs Intravenous Stopped 06/28/22 2151)  cefTRIAXone (ROCEPHIN) 2 g in sodium chloride 0.9 % 100 mL IVPB (2 g Intravenous New Bag/Given 06/28/22 2151)  fluconazole (DIFLUCAN) tablet 150 mg (150 mg Oral Given 06/28/22 2151)    Vitals:   06/28/22 2130 06/28/22 2145 06/28/22 2200 06/28/22 2215  BP:   100/72   Pulse:  85 86 84 81  Resp: 15 20 (!) 25 17  Temp:   98.8 Macdonald (37.1 C)   TempSrc:   Oral   SpO2: 94% 94% 94% 93%  Weight:      Height:        Final  diagnoses:  Acute cystitis without hematuria           Final Clinical Impression(s) / ED Diagnoses Final diagnoses:  Acute cystitis without hematuria    Rx / DC Orders ED Discharge Orders          Ordered    sulfamethoxazole-trimethoprim (BACTRIM DS) 800-160 MG tablet  2 times daily,   Status:  Discontinued        06/28/22 1840    cefpodoxime (VANTIN) 100 MG tablet  2 times daily        06/28/22 2218              Deno Etienne, DO 06/28/22 2250

## 2022-06-29 LAB — URINE CULTURE

## 2022-06-30 DIAGNOSIS — M542 Cervicalgia: Secondary | ICD-10-CM | POA: Diagnosis not present

## 2022-06-30 DIAGNOSIS — M961 Postlaminectomy syndrome, not elsewhere classified: Secondary | ICD-10-CM | POA: Diagnosis not present

## 2022-06-30 DIAGNOSIS — M25552 Pain in left hip: Secondary | ICD-10-CM | POA: Diagnosis not present

## 2022-06-30 DIAGNOSIS — G8221 Paraplegia, complete: Secondary | ICD-10-CM | POA: Diagnosis not present

## 2022-06-30 DIAGNOSIS — G890 Central pain syndrome: Secondary | ICD-10-CM | POA: Diagnosis not present

## 2022-07-03 LAB — CULTURE, BLOOD (ROUTINE X 2): Culture: NO GROWTH

## 2022-07-03 NOTE — ED Notes (Signed)
Lab called to give + [facility] results 1/4 gram + rods. Dr Francia Greaves has reviewed chart, pt is being treated with antibiotics presently. He will wait for complete results from cultures before determining any changes in medications.

## 2022-07-04 ENCOUNTER — Telehealth (HOSPITAL_BASED_OUTPATIENT_CLINIC_OR_DEPARTMENT_OTHER): Payer: Self-pay | Admitting: Emergency Medicine

## 2022-07-04 NOTE — Telephone Encounter (Signed)
Post ED Visit - Positive Culture Follow-up  Culture report reviewed by antimicrobial stewardship pharmacist: [place] Team '[]'$  Elenor Quinones, Pharm.D. '[]'$  Heide Guile, Pharm.D., BCPS AQ-ID '[]'$  Parks Neptune, Pharm.D., BCPS '[]'$  Alycia Rossetti, Pharm.D., BCPS '[]'$  [place], Pharm.D., BCPS, AAHIVP '[]'$  Legrand Como, Pharm.D., BCPS, AAHIVP '[]'$  Salome Arnt, PharmD, BCPS '[]'$  Johnnette Gourd, PharmD, BCPS '[]'$  Hughes Better, PharmD, BCPS '[]'$  Leeroy Cha, PharmD '[]'$  Laqueta Linden, PharmD, BCPS '[]'$  Albertina Parr, PharmD  [place] Team '[x]'$  Leodis Sias, PharmD '[]'$  Lindell Spar, PharmD '[]'$  Royetta Asal, PharmD '[]'$  Graylin Shiver, Rph '[]'$  Rema Fendt) Glennon Mac, PharmD '[]'$  Arlyn Dunning, PharmD '[]'$  Netta Cedars, PharmD '[]'$  Dia Sitter, PharmD '[]'$  Leone Haven, PharmD '[]'$  Gretta Arab, PharmD '[]'$  Theodis Shove, PharmD '[]'$  Peggyann Juba, PharmD '[]'$  Reuel Boom, PharmD   Positive blood culture Treated with cefpodoxime and sulfamethoxazole-trimethoprim, organism sensitive to the same and no further patient follow-up is required at this time.  Hazle Nordmann 07/04/2022, 10:35 AM

## 2022-07-05 LAB — CULTURE, BLOOD (ROUTINE X 2)

## 2022-07-06 DIAGNOSIS — Z433 Encounter for attention to colostomy: Secondary | ICD-10-CM | POA: Diagnosis not present

## 2022-07-13 DIAGNOSIS — B3731 Acute candidiasis of vulva and vagina: Secondary | ICD-10-CM | POA: Diagnosis not present

## 2022-07-13 DIAGNOSIS — I10 Essential (primary) hypertension: Secondary | ICD-10-CM | POA: Diagnosis not present

## 2022-07-13 DIAGNOSIS — J329 Chronic sinusitis, unspecified: Secondary | ICD-10-CM | POA: Diagnosis not present

## 2022-07-13 DIAGNOSIS — E1165 Type 2 diabetes mellitus with hyperglycemia: Secondary | ICD-10-CM | POA: Diagnosis not present

## 2022-07-13 DIAGNOSIS — L89159 Pressure ulcer of sacral region, unspecified stage: Secondary | ICD-10-CM | POA: Diagnosis not present

## 2022-07-13 DIAGNOSIS — R600 Localized edema: Secondary | ICD-10-CM | POA: Diagnosis not present

## 2022-07-15 DIAGNOSIS — Z79891 Long term (current) use of opiate analgesic: Secondary | ICD-10-CM | POA: Diagnosis not present

## 2022-07-15 DIAGNOSIS — G894 Chronic pain syndrome: Secondary | ICD-10-CM | POA: Diagnosis not present

## 2022-07-21 DIAGNOSIS — N390 Urinary tract infection, site not specified: Secondary | ICD-10-CM | POA: Diagnosis not present

## 2022-07-27 DIAGNOSIS — L89154 Pressure ulcer of sacral region, stage 4: Secondary | ICD-10-CM | POA: Diagnosis not present

## 2022-07-29 DIAGNOSIS — E1165 Type 2 diabetes mellitus with hyperglycemia: Secondary | ICD-10-CM | POA: Diagnosis not present

## 2022-08-07 DIAGNOSIS — G890 Central pain syndrome: Secondary | ICD-10-CM | POA: Diagnosis not present

## 2022-08-07 DIAGNOSIS — M25552 Pain in left hip: Secondary | ICD-10-CM | POA: Diagnosis not present

## 2022-08-07 DIAGNOSIS — M542 Cervicalgia: Secondary | ICD-10-CM | POA: Diagnosis not present

## 2022-08-07 DIAGNOSIS — M961 Postlaminectomy syndrome, not elsewhere classified: Secondary | ICD-10-CM | POA: Diagnosis not present

## 2022-08-07 DIAGNOSIS — G8221 Paraplegia, complete: Secondary | ICD-10-CM | POA: Diagnosis not present

## 2022-08-18 DIAGNOSIS — N390 Urinary tract infection, site not specified: Secondary | ICD-10-CM | POA: Diagnosis not present

## 2022-08-24 DIAGNOSIS — J029 Acute pharyngitis, unspecified: Secondary | ICD-10-CM | POA: Diagnosis not present

## 2022-08-24 DIAGNOSIS — R600 Localized edema: Secondary | ICD-10-CM | POA: Diagnosis not present

## 2022-08-24 DIAGNOSIS — M1990 Unspecified osteoarthritis, unspecified site: Secondary | ICD-10-CM | POA: Diagnosis not present

## 2022-08-24 DIAGNOSIS — B3731 Acute candidiasis of vulva and vagina: Secondary | ICD-10-CM | POA: Diagnosis not present

## 2022-08-24 DIAGNOSIS — I10 Essential (primary) hypertension: Secondary | ICD-10-CM | POA: Diagnosis not present

## 2022-08-24 DIAGNOSIS — E1165 Type 2 diabetes mellitus with hyperglycemia: Secondary | ICD-10-CM | POA: Diagnosis not present

## 2022-08-28 DIAGNOSIS — E1165 Type 2 diabetes mellitus with hyperglycemia: Secondary | ICD-10-CM | POA: Diagnosis not present

## 2022-08-31 ENCOUNTER — Emergency Department (HOSPITAL_COMMUNITY): Payer: Medicare HMO

## 2022-08-31 ENCOUNTER — Encounter (HOSPITAL_COMMUNITY): Payer: Self-pay | Admitting: Emergency Medicine

## 2022-08-31 ENCOUNTER — Inpatient Hospital Stay (HOSPITAL_COMMUNITY)
Admission: EM | Admit: 2022-08-31 | Discharge: 2022-09-03 | DRG: 091 | Disposition: A | Discharge status: Home / Self Care | Payer: Medicare HMO | Attending: Family Medicine | Admitting: Family Medicine

## 2022-08-31 ENCOUNTER — Other Ambulatory Visit: Payer: Self-pay

## 2022-08-31 DIAGNOSIS — Z91040 Latex allergy status: Secondary | ICD-10-CM

## 2022-08-31 DIAGNOSIS — G8250 Quadriplegia, unspecified: Secondary | ICD-10-CM | POA: Diagnosis present

## 2022-08-31 DIAGNOSIS — U071 COVID-19: Secondary | ICD-10-CM | POA: Diagnosis present

## 2022-08-31 DIAGNOSIS — Z79890 Hormone replacement therapy (postmenopausal): Secondary | ICD-10-CM

## 2022-08-31 DIAGNOSIS — M797 Fibromyalgia: Secondary | ICD-10-CM | POA: Diagnosis present

## 2022-08-31 DIAGNOSIS — Y846 Urinary catheterization as the cause of abnormal reaction of the patient, or of later complication, without mention of misadventure at the time of the procedure: Secondary | ICD-10-CM | POA: Diagnosis present

## 2022-08-31 DIAGNOSIS — Z7401 Bed confinement status: Secondary | ICD-10-CM

## 2022-08-31 DIAGNOSIS — E871 Hypo-osmolality and hyponatremia: Secondary | ICD-10-CM | POA: Diagnosis present

## 2022-08-31 DIAGNOSIS — I959 Hypotension, unspecified: Secondary | ICD-10-CM | POA: Diagnosis present

## 2022-08-31 DIAGNOSIS — G894 Chronic pain syndrome: Secondary | ICD-10-CM | POA: Diagnosis present

## 2022-08-31 DIAGNOSIS — L89154 Pressure ulcer of sacral region, stage 4: Secondary | ICD-10-CM | POA: Diagnosis present

## 2022-08-31 DIAGNOSIS — N390 Urinary tract infection, site not specified: Secondary | ICD-10-CM | POA: Diagnosis not present

## 2022-08-31 DIAGNOSIS — E876 Hypokalemia: Secondary | ICD-10-CM | POA: Diagnosis present

## 2022-08-31 DIAGNOSIS — G9340 Encephalopathy, unspecified: Secondary | ICD-10-CM | POA: Diagnosis present

## 2022-08-31 DIAGNOSIS — E11610 Type 2 diabetes mellitus with diabetic neuropathic arthropathy: Secondary | ICD-10-CM | POA: Diagnosis present

## 2022-08-31 DIAGNOSIS — Z993 Dependence on wheelchair: Secondary | ICD-10-CM

## 2022-08-31 DIAGNOSIS — K219 Gastro-esophageal reflux disease without esophagitis: Secondary | ICD-10-CM | POA: Diagnosis present

## 2022-08-31 DIAGNOSIS — T83511A Infection and inflammatory reaction due to indwelling urethral catheter, initial encounter: Secondary | ICD-10-CM | POA: Diagnosis not present

## 2022-08-31 DIAGNOSIS — Z91018 Allergy to other foods: Secondary | ICD-10-CM

## 2022-08-31 DIAGNOSIS — E785 Hyperlipidemia, unspecified: Secondary | ICD-10-CM | POA: Diagnosis present

## 2022-08-31 DIAGNOSIS — Z884 Allergy status to anesthetic agent status: Secondary | ICD-10-CM

## 2022-08-31 DIAGNOSIS — Z933 Colostomy status: Secondary | ICD-10-CM

## 2022-08-31 DIAGNOSIS — E861 Hypovolemia: Secondary | ICD-10-CM | POA: Diagnosis present

## 2022-08-31 DIAGNOSIS — Z808 Family history of malignant neoplasm of other organs or systems: Secondary | ICD-10-CM

## 2022-08-31 DIAGNOSIS — M170 Bilateral primary osteoarthritis of knee: Secondary | ICD-10-CM | POA: Diagnosis present

## 2022-08-31 DIAGNOSIS — G901 Familial dysautonomia [Riley-Day]: Secondary | ICD-10-CM | POA: Diagnosis present

## 2022-08-31 DIAGNOSIS — R410 Disorientation, unspecified: Principal | ICD-10-CM

## 2022-08-31 DIAGNOSIS — Z981 Arthrodesis status: Secondary | ICD-10-CM

## 2022-08-31 DIAGNOSIS — I517 Cardiomegaly: Secondary | ICD-10-CM | POA: Diagnosis not present

## 2022-08-31 DIAGNOSIS — I10 Essential (primary) hypertension: Secondary | ICD-10-CM | POA: Diagnosis not present

## 2022-08-31 DIAGNOSIS — E039 Hypothyroidism, unspecified: Secondary | ICD-10-CM | POA: Diagnosis present

## 2022-08-31 DIAGNOSIS — E860 Dehydration: Secondary | ICD-10-CM | POA: Diagnosis present

## 2022-08-31 DIAGNOSIS — M5090 Cervical disc disorder, unspecified, unspecified cervical region: Secondary | ICD-10-CM | POA: Diagnosis present

## 2022-08-31 DIAGNOSIS — G928 Other toxic encephalopathy: Principal | ICD-10-CM | POA: Diagnosis present

## 2022-08-31 DIAGNOSIS — Z743 Need for continuous supervision: Secondary | ICD-10-CM | POA: Diagnosis not present

## 2022-08-31 DIAGNOSIS — E11649 Type 2 diabetes mellitus with hypoglycemia without coma: Secondary | ICD-10-CM | POA: Diagnosis present

## 2022-08-31 DIAGNOSIS — E118 Type 2 diabetes mellitus with unspecified complications: Secondary | ICD-10-CM | POA: Diagnosis present

## 2022-08-31 DIAGNOSIS — D869 Sarcoidosis, unspecified: Secondary | ICD-10-CM | POA: Diagnosis present

## 2022-08-31 DIAGNOSIS — Z79899 Other long term (current) drug therapy: Secondary | ICD-10-CM

## 2022-08-31 DIAGNOSIS — E1169 Type 2 diabetes mellitus with other specified complication: Secondary | ICD-10-CM | POA: Diagnosis present

## 2022-08-31 DIAGNOSIS — Z8673 Personal history of transient ischemic attack (TIA), and cerebral infarction without residual deficits: Secondary | ICD-10-CM

## 2022-08-31 DIAGNOSIS — Z8616 Personal history of COVID-19: Secondary | ICD-10-CM

## 2022-08-31 DIAGNOSIS — Z6841 Body Mass Index (BMI) 40.0 and over, adult: Secondary | ICD-10-CM

## 2022-08-31 DIAGNOSIS — I9589 Other hypotension: Secondary | ICD-10-CM | POA: Diagnosis present

## 2022-08-31 DIAGNOSIS — Z880 Allergy status to penicillin: Secondary | ICD-10-CM

## 2022-08-31 DIAGNOSIS — G8929 Other chronic pain: Secondary | ICD-10-CM | POA: Diagnosis present

## 2022-08-31 DIAGNOSIS — L89323 Pressure ulcer of left buttock, stage 3: Secondary | ICD-10-CM | POA: Diagnosis present

## 2022-08-31 DIAGNOSIS — R4182 Altered mental status, unspecified: Secondary | ICD-10-CM | POA: Diagnosis not present

## 2022-08-31 DIAGNOSIS — Z888 Allergy status to other drugs, medicaments and biological substances status: Secondary | ICD-10-CM

## 2022-08-31 DIAGNOSIS — Z794 Long term (current) use of insulin: Secondary | ICD-10-CM

## 2022-08-31 LAB — CBC WITH DIFFERENTIAL/PLATELET
Abs Immature Granulocytes: 0.02 10*3/uL (ref 0.00–0.07)
Basophils Absolute: 0 10*3/uL (ref 0.0–0.1)
Basophils Relative: 0 %
Eosinophils Absolute: 0.2 10*3/uL (ref 0.0–0.5)
Eosinophils Relative: 3 %
HCT: 33.2 % — ABNORMAL LOW (ref 36.0–46.0)
Hemoglobin: 10.3 g/dL — ABNORMAL LOW (ref 12.0–15.0)
Immature Granulocytes: 0 %
Lymphocytes Relative: 35 %
Lymphs Abs: 2.6 10*3/uL (ref 0.7–4.0)
MCH: 28.6 pg (ref 26.0–34.0)
MCHC: 31 g/dL (ref 30.0–36.0)
MCV: 92.2 fL (ref 80.0–100.0)
Monocytes Absolute: 0.7 10*3/uL (ref 0.1–1.0)
Monocytes Relative: 10 %
Neutro Abs: 3.8 10*3/uL (ref 1.7–7.7)
Neutrophils Relative %: 52 %
Platelets: 195 10*3/uL (ref 150–400)
RBC: 3.6 MIL/uL — ABNORMAL LOW (ref 3.87–5.11)
RDW: 14 % (ref 11.5–15.5)
WBC: 7.4 10*3/uL (ref 4.0–10.5)
nRBC: 0 % (ref 0.0–0.2)

## 2022-08-31 LAB — COMPREHENSIVE METABOLIC PANEL
ALT: 18 U/L (ref 0–44)
AST: 31 U/L (ref 15–41)
Albumin: 2.7 g/dL — ABNORMAL LOW (ref 3.5–5.0)
Alkaline Phosphatase: 66 U/L (ref 38–126)
Anion gap: 10 (ref 5–15)
BUN: 20 mg/dL (ref 8–23)
CO2: 22 mmol/L (ref 22–32)
Calcium: 8 mg/dL — ABNORMAL LOW (ref 8.9–10.3)
Chloride: 99 mmol/L (ref 98–111)
Creatinine, Ser: 0.86 mg/dL (ref 0.44–1.00)
GFR, Estimated: >60 mL/min (ref >60)
Glucose, Bld: 149 mg/dL — ABNORMAL HIGH (ref 70–99)
Potassium: 3.2 mmol/L — ABNORMAL LOW (ref 3.5–5.1)
Sodium: 131 mmol/L — ABNORMAL LOW (ref 135–145)
Total Bilirubin: 0.6 mg/dL (ref 0.3–1.2)
Total Protein: 6.5 g/dL (ref 6.5–8.1)

## 2022-08-31 LAB — URINALYSIS, ROUTINE W REFLEX MICROSCOPIC
Bilirubin Urine: NEGATIVE
Glucose, UA: NEGATIVE mg/dL
Ketones, ur: NEGATIVE mg/dL
Nitrite: NEGATIVE
Protein, ur: 30 mg/dL — AB
RBC / HPF: >50 RBC/hpf (ref 0–5)
Specific Gravity, Urine: 1.017 (ref 1.005–1.030)
WBC, UA: >50 WBC/hpf (ref 0–5)
pH: 5 (ref 5.0–8.0)

## 2022-08-31 LAB — PROTIME-INR
INR: 1.1 (ref 0.8–1.2)
Prothrombin Time: 13.7 seconds (ref 11.4–15.2)

## 2022-08-31 LAB — LACTIC ACID, PLASMA: Lactic Acid, Venous: 1.9 mmol/L (ref 0.5–1.9)

## 2022-08-31 LAB — CBG MONITORING, ED: Glucose-Capillary: 193 mg/dL — ABNORMAL HIGH (ref 70–99)

## 2022-08-31 MED ORDER — SODIUM CHLORIDE 0.9 % IV BOLUS
1000.0000 mL | Freq: Once | INTRAVENOUS | Status: AC
  Administered 2022-08-31: 1000 mL via INTRAVENOUS

## 2022-08-31 MED ORDER — INSULIN GLARGINE (1 UNIT DIAL) 300 UNIT/ML ~~LOC~~ SOPN: 60.0000 [IU] | PEN_INJECTOR | Freq: Every day | SUBCUTANEOUS | Status: DC

## 2022-08-31 MED ORDER — MONTELUKAST SODIUM 10 MG PO TABS
10.0000 mg | ORAL_TABLET | Freq: Every day | ORAL | Status: DC
  Administered 2022-08-31 – 2022-09-03 (×4): 10 mg via ORAL
  Filled 2022-08-31 (×4): qty 1

## 2022-08-31 MED ORDER — LACTATED RINGERS IV SOLN: INTRAVENOUS | Status: DC

## 2022-08-31 MED ORDER — LEVOTHYROXINE SODIUM 112 MCG PO TABS
168.0000 ug | ORAL_TABLET | Freq: Every day | ORAL | Status: DC
  Administered 2022-09-01 – 2022-09-03 (×3): 168 ug via ORAL
  Filled 2022-08-31 (×3): qty 2

## 2022-08-31 MED ORDER — ACETAMINOPHEN 325 MG PO TABS
650.0000 mg | ORAL_TABLET | Freq: Four times a day (QID) | ORAL | Status: DC | PRN
  Administered 2022-09-01 – 2022-09-02 (×4): 650 mg via ORAL
  Filled 2022-08-31 (×4): qty 2

## 2022-08-31 MED ORDER — ACETAMINOPHEN 650 MG RE SUPP: 650.0000 mg | Freq: Four times a day (QID) | RECTAL | Status: DC | PRN

## 2022-08-31 MED ORDER — INSULIN GLARGINE-YFGN 100 UNIT/ML ~~LOC~~ SOLN
60.0000 [IU] | Freq: Every day | SUBCUTANEOUS | Status: DC
  Administered 2022-08-31 – 2022-09-03 (×4): 60 [IU] via SUBCUTANEOUS
  Filled 2022-08-31 (×5): qty 0.6

## 2022-08-31 MED ORDER — MORPHINE SULFATE ER 15 MG PO TBCR
15.0000 mg | EXTENDED_RELEASE_TABLET | Freq: Three times a day (TID) | ORAL | Status: DC
  Administered 2022-09-01 – 2022-09-03 (×9): 15 mg via ORAL
  Filled 2022-08-31 (×9): qty 1

## 2022-08-31 MED ORDER — SENNOSIDES-DOCUSATE SODIUM 8.6-50 MG PO TABS: 1.0000 | ORAL_TABLET | Freq: Every evening | ORAL | Status: DC | PRN

## 2022-08-31 MED ORDER — OXYCODONE HCL 20 MG PO TABS: 20.0000 mg | ORAL_TABLET | Freq: Every day | ORAL | Status: DC

## 2022-08-31 MED ORDER — ENOXAPARIN SODIUM 60 MG/0.6ML IJ SOSY
0.5000 mg/kg | PREFILLED_SYRINGE | INTRAMUSCULAR | Status: DC
  Administered 2022-08-31 – 2022-09-03 (×4): 60 mg via SUBCUTANEOUS
  Filled 2022-08-31 (×4): qty 0.6

## 2022-08-31 MED ORDER — OXYCODONE HCL 5 MG PO TABS
20.0000 mg | ORAL_TABLET | Freq: Every day | ORAL | Status: DC
  Administered 2022-08-31 – 2022-09-03 (×15): 20 mg via ORAL
  Filled 2022-08-31 (×15): qty 4

## 2022-08-31 MED ORDER — AMITRIPTYLINE HCL 25 MG PO TABS
100.0000 mg | ORAL_TABLET | Freq: Every day | ORAL | Status: DC
  Administered 2022-08-31 – 2022-09-03 (×4): 100 mg via ORAL
  Filled 2022-08-31 (×4): qty 4

## 2022-08-31 MED ORDER — ONDANSETRON HCL 4 MG/2ML IJ SOLN: 4.0000 mg | Freq: Four times a day (QID) | INTRAMUSCULAR | Status: DC | PRN

## 2022-08-31 MED ORDER — PANTOPRAZOLE SODIUM 40 MG PO TBEC
40.0000 mg | DELAYED_RELEASE_TABLET | Freq: Every day | ORAL | Status: DC
  Administered 2022-09-01 – 2022-09-03 (×3): 40 mg via ORAL
  Filled 2022-08-31 (×3): qty 1

## 2022-08-31 MED ORDER — SODIUM CHLORIDE 0.9% FLUSH
3.0000 mL | Freq: Two times a day (BID) | INTRAVENOUS | Status: DC
  Administered 2022-08-31 – 2022-09-02 (×4): 3 mL via INTRAVENOUS

## 2022-08-31 MED ORDER — ONDANSETRON HCL 4 MG PO TABS: 4.0000 mg | ORAL_TABLET | Freq: Four times a day (QID) | ORAL | Status: DC | PRN

## 2022-08-31 MED ORDER — GABAPENTIN 400 MG PO CAPS
800.0000 mg | ORAL_CAPSULE | Freq: Three times a day (TID) | ORAL | Status: DC
  Administered 2022-08-31 – 2022-09-02 (×5): 800 mg via ORAL
  Filled 2022-08-31 (×5): qty 2

## 2022-08-31 MED ORDER — HYDROXYZINE HCL 10 MG PO TABS
10.0000 mg | ORAL_TABLET | Freq: Every day | ORAL | Status: DC
  Administered 2022-08-31 – 2022-09-03 (×4): 10 mg via ORAL
  Filled 2022-08-31 (×4): qty 1

## 2022-08-31 MED ORDER — SODIUM CHLORIDE 0.9 % IV SOLN
2.0000 g | INTRAVENOUS | Status: DC
  Administered 2022-08-31 – 2022-09-02 (×3): 2 g via INTRAVENOUS
  Filled 2022-08-31 (×3): qty 20

## 2022-08-31 MED ORDER — INSULIN ASPART 100 UNIT/ML IJ SOLN
0.0000 [IU] | Freq: Three times a day (TID) | INTRAMUSCULAR | Status: DC
  Administered 2022-09-01 (×2): 15 [IU] via SUBCUTANEOUS
  Filled 2022-08-31: qty 0.2

## 2022-08-31 MED ORDER — PRAVASTATIN SODIUM 20 MG PO TABS
20.0000 mg | ORAL_TABLET | Freq: Every day | ORAL | Status: DC
  Administered 2022-09-01 – 2022-09-03 (×3): 20 mg via ORAL
  Filled 2022-08-31 (×3): qty 1

## 2022-08-31 MED ORDER — POTASSIUM CHLORIDE 20 MEQ PO PACK
40.0000 meq | PACK | Freq: Once | ORAL | Status: AC
  Administered 2022-08-31: 40 meq via ORAL
  Filled 2022-08-31: qty 2

## 2022-08-31 NOTE — ED Provider Notes (Signed)
Emergency Department Provider Note   I have reviewed the triage vital signs and the nursing notes.   HISTORY  Chief Complaint Altered Mental Status   HPI Natalie Macdonald is a 67 y.o. female with PMH of quadriplegia, diabetes, GERD, hypertension, and chronic indwelling foley cath on Keflex suppression therapy presents to the emergency department with altered mental status.  Family reports she is increasingly agitated and confused.  She had her Keflex dose doubled in the outpatient setting with concern for urinary tract infection.  Some of her urine symptoms improved but apparently was more combative today with family and confused.  She has a known sacral wound as well.  Patient denies any pain different or worse than her normal discomfort.  No shortness of breath. BP low with EMS but improved with IVF (590m) prior to arrival.    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 ([place]    Chronic osteomyelitis ([facility]    left foot   Chronic respiratory infection    Diabetes mellitus without complication (HCC)    Diabetes mellitus, type II (HCC)    Diabetic foot ulcer (HCC)    left foot   Fibromyalgia    GERD (gastroesophageal reflux disease)    Headache    sinus   History of blood transfusion    Hypertension    Hyponatremia    hypothyroid    Hypothyroidism    Joint pain    bilateral legs   Left hemiplegia (HCC)    Sarcoidosis    Sepsis ([place]    HX   Sepsis ([facility]    Uterine fibroid    hx   Uterine fibroid     Review of Systems  Constitutional: Positive fever/chills. Positive AMS. Cardiovascular: Denies chest pain. Respiratory: Denies shortness of breath. Gastrointestinal: No vomiting or diarrhea.  ____________________________________________   PHYSICAL EXAM:  VITAL SIGNS: ED Triage  Vitals  Enc Vitals Group     BP 08/31/22 1521 (!) 106/54     Pulse Rate 08/31/22 1521 76     Resp 08/31/22 1521 16     Temp 08/31/22 1521 99 F (37.2 C)     Temp Source 08/31/22 1521 Oral     SpO2 08/31/22 1521 94 %   Constitutional: Alert and able to provide a history but appears somewhat fatigued. No acute distress.  Eyes: Conjunctivae are normal.  Head: Atraumatic. Nose: No congestion/rhinnorhea. Mouth/Throat: Mucous membranes are moist.   Neck: No stridor.   Cardiovascular: Normal rate, regular rhythm. Good peripheral circulation. Grossly normal heart sounds.   Respiratory: Normal respiratory effort.  No retractions. Lungs CTAB. Gastrointestinal: Soft and nontender. No distention.  Neurologic:  Normal speech and language. Baseline quadriplegia.  Skin:  Skin is warm and dry. Sacral wound covering much of the lower back with saturated, purulent dressings.   ____________________________________________   LABS (all labs ordered are listed, but only abnormal results are displayed)  Labs Reviewed  COMPREHENSIVE METABOLIC PANEL - Abnormal; Notable for the following components:      Result Value   Sodium 131 (*)    Potassium 3.2 (*)    Glucose, Bld 149 (*)    Calcium 8.0 (*)    Albumin 2.7 (*)    All other components within normal limits  CBC WITH DIFFERENTIAL/PLATELET - Abnormal; Notable for the following components:   RBC 3.60 (*)    Hemoglobin 10.3 (*)    HCT 33.2 (*)    All other components within normal limits  URINALYSIS, ROUTINE W REFLEX MICROSCOPIC - Abnormal; Notable for the following components:   Color, Urine AMBER (*)    APPearance TURBID (*)    Hgb urine dipstick MODERATE (*)    Protein, ur 30 (*)    Leukocytes,Ua MODERATE (*)    Bacteria, UA MANY (*)    All other components within normal limits  CBG MONITORING, ED - Abnormal; Notable for the following components:   Glucose-Capillary 193 (*)    All other components within normal limits  CULTURE, BLOOD  (ROUTINE X 2)  CULTURE, BLOOD (ROUTINE X 2)  URINE CULTURE  LACTIC ACID, PLASMA  PROTIME-INR  HIV ANTIBODY (ROUTINE TESTING W REFLEX)  BASIC METABOLIC PANEL  CBC  HEMOGLOBIN A1C   ____________________________________________  EKG   EKG Interpretation  Date/Time:  Thursday August 31 2022 15:20:50 EST Ventricular Rate:  76 PR Interval:  182 QRS Duration: 182 QT Interval:  486 QTC Calculation: 547 R Axis:   7 Text Interpretation: Sinus rhythm Right bundle branch block Confirmed by Nanda Quinton [phone]) on 08/31/2022 4:33:39 PM        ____________________________________________  RADIOLOGY  DG Chest 2 View  Result Date: 08/31/2022 CLINICAL DATA:  Suspected Sepsis EXAM: CHEST - 2 VIEW COMPARISON:  Chest x-ray 06/28/2022. FINDINGS: Similar cardiomegaly. No consolidation. No visible pleural effusions or pneumothorax. No acute osseous abnormality. Partially imaged ACDF. IMPRESSION: Similar cardiomegaly.  No consolidation. Electronically Signed   By: Margaretha Sheffield M.D.   On: 08/31/2022 16:27    ____________________________________________   PROCEDURES  Procedure(s) performed:   Procedures  CRITICAL CARE Performed by: Margette Fast Total critical care time: 35 minutes Critical care time was exclusive of separately billable procedures and treating other patients. Critical care was necessary to treat or prevent imminent or life-threatening deterioration. Critical care was time spent personally by me on the following activities: development of treatment plan with patient and/or surrogate as well as nursing, discussions with consultants, evaluation of patient's response to treatment, examination of patient, obtaining history from patient or surrogate, ordering and performing treatments and interventions, ordering and review of laboratory studies, ordering and review of radiographic studies, pulse oximetry and re-evaluation of patient's condition.  Nanda Quinton, MD Emergency  Medicine  ____________________________________________   INITIAL IMPRESSION / ASSESSMENT AND PLAN / ED COURSE  Pertinent labs & imaging results that were available during my care of the patient were reviewed by me and considered in my medical decision making (see chart for details).   This patient is Presenting for Evaluation of AMS, which does require a range of treatment options, and is a complaint that involves a high risk of morbidity and mortality.  The Differential Diagnoses includes but is not exclusive to alcohol, illicit or prescription medications, intracranial pathology such as stroke, intracerebral hemorrhage, fever or infectious causes including sepsis, hypoxemia, uremia, trauma, endocrine related disorders such as diabetes, hypoglycemia, thyroid-related diseases, etc.   Critical Interventions-    Medications  lactated ringers infusion ( Intravenous New Bag/Given 08/31/22 1649)  cefTRIAXone (ROCEPHIN) 2 g in sodium chloride 0.9 % 100 mL IVPB (0 g Intravenous Stopped 08/31/22 1718)  enoxaparin (LOVENOX) injection 60 mg (60 mg Subcutaneous Given 08/31/22 2136)  sodium chloride flush (NS) 0.9 % injection 3 mL (3 mLs Intravenous Given 08/31/22 2140)  acetaminophen (TYLENOL)  tablet 650 mg (has no administration in time range)    Or  acetaminophen (TYLENOL) suppository 650 mg (has no administration in time range)  ondansetron (ZOFRAN) tablet 4 mg (has no administration in time range)    Or  ondansetron (ZOFRAN) injection 4 mg (has no administration in time range)  senna-docusate (Senokot-S) tablet 1 tablet (has no administration in time range)  amitriptyline (ELAVIL) tablet 100 mg (100 mg Oral Given 08/31/22 2134)  gabapentin (NEURONTIN) capsule 800 mg (800 mg Oral Given 08/31/22 2134)  hydrOXYzine (ATARAX) tablet 10 mg (10 mg Oral Given 08/31/22 2135)  montelukast (SINGULAIR) tablet 10 mg (10 mg Oral Given 08/31/22 2136)  morphine (MS CONTIN) 12 hr tablet 15 mg (0 mg Oral Hold 08/31/22  2154)  pravastatin (PRAVACHOL) tablet 20 mg (has no administration in time range)  pantoprazole (PROTONIX) EC tablet 40 mg (has no administration in time range)  levothyroxine (SYNTHROID) tablet 168 mcg (has no administration in time range)  insulin aspart (novoLOG) injection 0-20 Units (has no administration in time range)  insulin glargine-yfgn (SEMGLEE) injection 60 Units (60 Units Subcutaneous Given 08/31/22 2154)  oxyCODONE (Oxy IR/ROXICODONE) immediate release tablet 20 mg (20 mg Oral Given 08/31/22 2135)  sodium chloride 0.9 % bolus 1,000 mL (0 mLs Intravenous Stopped 08/31/22 1757)  sodium chloride 0.9 % bolus 1,000 mL (0 mLs Intravenous Stopped 08/31/22 2136)  potassium chloride (KLOR-CON) packet 40 mEq (40 mEq Oral Given 08/31/22 1957)    Reassessment after intervention: BP improved with IVF.    I did obtain Additional Historical Information from EMS.   I decided to review pertinent External Data, and in summary patient with urine cultures in 2023 and 2022 with multiple species. Has grown pan-sensitive E. Coli in the past.    Clinical Laboratory Tests Ordered, included CBC with a Lucas ptosis.  Mild anemia.  UA concerning for infection versus colonization from indwelling Foley.  No acute kidney injury.  Radiologic Tests Ordered, included CXR. I independently interpreted the images and agree with radiology interpretation.   Cardiac Monitor Tracing which shows NSR.    Social Determinants of Health Risk patient is a non-smoker.   Consult complete with [facility]. Plan for admit.   Medical Decision Making: Summary:  Patient presents emergency department with altered mental status and fever.  Has indwelling Foley and on suppressive therapy with Keflex.  Has been on this recently.  Apparently, had low blood pressure with EMS although improved with IV fluid and route.  Patient is quadriplegic without report of falls.  No outward sign of head trauma.  Neuroexam appears to be at patient's  baseline. Plan for sepsis workup and abx. Sacral wounds noted and are a possible source of fever as well.   Reevaluation with update and discussion with patient and daughter now at bedside. Plan for admit. They are in agreement.   Patient's presentation is most consistent with acute presentation with potential threat to life or bodily function.   Disposition: discharge  ____________________________________________  FINAL CLINICAL IMPRESSION(S) / ED DIAGNOSES  Final diagnoses:  Disorientation  Urinary tract infection without hematuria, site unspecified  Hypotension due to hypovolemia    Note:  This document was prepared using Dragon voice recognition software and may include unintentional dictation errors.  Nanda Quinton, MD, [facility] Emergency Medicine    Fransisco Messmer, [facility], MD 08/31/22 [phone]

## 2022-08-31 NOTE — ED Notes (Signed)
Patient refuses COVID swab

## 2022-08-31 NOTE — ED Triage Notes (Signed)
Patients from home due to UTI. The patient lives at home with her daughter. The daughter reports the patient has had dark urine for about 2 weeks and shows some confusion. She normally take an antibiotic daily prophylactically. Her PCP confirmed she has a UTI an double her usual antibiotic. While urinary symptoms have improved, she became combative with family today. She also has a sacral wound. EMS noticed she was lethargic, but family believes it's due to changes in her amitriptyline. EMS administered 500 ml of fluid.  Patient is quadriplegic with some hand functioning    EMS vitals: 70 pal BP 77 HR 20 RR 173 CBG

## 2022-08-31 NOTE — Hospital Course (Signed)
Natalie Macdonald is a 67 y.o. female with medical history significant for paraplegia with bedbound status, history of CVA, chronic sacral pressure wound, chronic indwelling Foley catheter with recurrent UTI on suppressive Keflex, s/p diverting colostomy, insulin-dependent T2DM, HTN, HLD, hypothyroidism, cervical spinal stenosis s/p ACDF, chronic pain on long-term narcotics who is admitted with acute metabolic encephalopathy in setting of UTI.

## 2022-08-31 NOTE — H&P (Signed)
History and Physical    Natalie Macdonald [id number] DOB: 1956-06-28 DOA: 08/31/2022  PCP: Pcp, No  Patient coming from: Home  I have personally briefly reviewed patient's old medical records in [place]  Chief Complaint: Altered mental status  HPI: Natalie Macdonald is a 67 y.o. female with medical history significant for paraplegia with bedbound status, history of CVA, chronic sacral pressure wound, chronic indwelling Foley catheter with recurrent UTI on suppressive Keflex, s/p diverting colostomy, insulin-dependent T2DM, HTN, HLD, hypothyroidism, cervical spinal stenosis s/p ACDF, chronic pain on long-term narcotics who presented to the ED for evaluation of altered mental status.  History is supplemented by patient's daughter at bedside.  Patient is chronically bedbound with functional paraplegia.  She lives with her daughter who is her caretaker and POA.  Daughter is a retired Gaffer and helps care for patient 24/7 and assists with her bathing, wound, ostomy, and foley catheter care.  Daughter notes that over the last week patient has been intermittently confused.  She has noted that her urine output has appeared darker with orange discoloration.  She has had decreased appetite and has not been eating as much.  She has had some nausea without emesis.  She has had occasional diaphoresis.  Today she was increasingly confused and combative for which was a significant change from her baseline.  EMS were called and she was noted to be hypotensive.  She was given 500 cc fluid and brought to the ED for further evaluation.  Daughter states that this is similar to an episode 2 months ago where she had a recurrent UTI.  She was seen in the ED at that time on 06/28/22.  Urine culture grew multiple species blood culture was positive for corynebacterium species.  She was treated with cefpodoxime and Bactrim with improvement.  Her suppressive Keflex was increased from 250 mg to 500 mg daily  afterwards.  ED Course  Labs/Imaging on admission: I have personally reviewed following labs and imaging studies.  Initial vitals showed BP 106/54, pulse 74, RR 16, temp 99.0 F, SpO2 94% on room air.  Labs show WBC 7.4, hemoglobin 10.3, platelets 195,000, sodium 131, potassium 3.2, bicarb 22, BUN 20, creatinine 0.6, serum glucose 149, LFTs within normal limits, lactic acid 1.9.  Urinalysis shows negative nitrates, moderate leukocytes, >50 RBCs and WBCs, many bacteria on microscopy.  Blood cultures in process.  2 view chest x-ray negative for focal consolidation, edema, effusion.  Patient was given 2 L normal saline, IV ceftriaxone 2 g.  The hospitalist service was consulted to admit for further evaluation and management.  Review of Systems:  All systems reviewed and are negative except as documented in history of present illness above.   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 ([place])    Chronic osteomyelitis ([place])    left foot   Chronic respiratory infection    Diabetes mellitus without complication (HCC)    Diabetes mellitus, type II (HCC)    Diabetic foot ulcer (HCC)    left foot   Fibromyalgia    GERD (gastroesophageal reflux disease)    Headache    sinus   History of blood transfusion    Hypertension    Hyponatremia    hypothyroid    Hypothyroidism  Joint pain    bilateral legs   Left hemiplegia (HCC)    Sarcoidosis    Sepsis ([place])    HX   Sepsis ([place])    Uterine fibroid    hx   Uterine fibroid     Past Surgical History:  Procedure Laterality Date   ANTERIOR CERVICAL DECOMP/DISCECTOMY FUSION N/A 10/17/2016   Procedure: ANTERIOR CERVICAL DECOMPRESSION/DISCECTOMY FUSION CERVICAL FIVE- CERVICAL SIX, CERVICAL SIX- CERVICAL SEVEN, POSSIBLE CERVICAL SIX CORPECTOMY;  Surgeon:  Consuella Lose, MD;  Location: [facility];  Service: Neurosurgery;  Laterality: N/A;   ANTERIOR CERVICAL DECOMP/DISCECTOMY FUSION     APPLICATION OF WOUND VAC Left    foot   CHOLECYSTECTOMY     FOOT SURGERY Bilateral    implants   IRRIGATION AND DEBRIDEMENT FOOT Left    X2   KNEE ARTHROPLASTY     THYROIDECTOMY     UTERINE FIBROID SURGERY      Social History:  reports that she has never smoked. She has never used smokeless tobacco. She reports that she does not drink alcohol and does not use drugs.  Allergies  Allergen Reactions   Grapefruit Bioflavonoid Complex Anaphylaxis, Hives, Swelling and Other (See Comments)    Caused her to get hives, swelling of an eye, throat    Insulin Lispro Rash and Other (See Comments)    Caused patient to pass out b/c was not regulating insulin correctly & also caused pt to develop a severe reaction site rash. **OF NOTE:Patient CAN use Lantus &  Humulin-R.**   Latex Hives   Procaine Anaphylaxis   Tape Rash and Other (See Comments)    PAPER TAPE ONLY!!   Novolog [Insulin Aspart] Other (See Comments)    Okay to use Regular insulin (patient doesn't tolerate Novolog)     Penicillins Hives, Itching, Swelling and Other (See Comments)    Tolerates Keflex, Augmentin (2021)    Family History  Problem Relation Age of Onset   Throat cancer Mother      Prior to Admission medications   Medication Sig Start Date End Date Taking? Authorizing Provider  amitriptyline (ELAVIL) 100 MG tablet Take 100 mg by mouth at bedtime.     [provider]  amLODipine-benazepril (LOTREL) 5-20 MG capsule Take 1 capsule by mouth daily. 04/26/21 04/26/22  [name], MD  APIDRA 100 UNIT/ML injection Inject 18-25 Units into the skin 3 (three) times daily with meals. Sliding Scale 12/13/17   [provider]  Calcium Carbonate (CALCIUM 600 PO) Take 600 mg by mouth daily.    [provider]  carvedilol (COREG) 25 MG tablet Take 25 mg by mouth 2  (two) times daily with a meal.    [provider]  cholecalciferol (VITAMIN D3) 25 MCG (1000 UNIT) tablet Take 1,000 Units by mouth daily.    [provider]  CRANBERRY PO Take 2 capsules by mouth daily.    [provider]  cyclobenzaprine (FLEXERIL) 10 MG tablet Take 10 mg by mouth at bedtime.    [provider]  diclofenac Sodium (VOLTAREN) 1 % GEL Apply 2 g topically daily as needed (pain).    [provider]  fluticasone (FLONASE) 50 MCG/ACT nasal spray Place 2 sprays into both nostrils daily as needed for allergies.     [provider]  gabapentin (NEURONTIN) 300 MG capsule Take 300 mg by mouth 4 (four) times daily.    [provider]  hydrOXYzine (ATARAX/VISTARIL) 10 MG tablet Take 10 mg by mouth at  bedtime. 08/29/19   [provider]  levothyroxine (SYNTHROID) 112 MCG tablet Take 1.5 tablets (168 mcg total) by mouth daily before breakfast. 09/09/19   Mariel Aloe, MD  montelukast (SINGULAIR) 10 MG tablet Take 10 mg by mouth at bedtime.    [provider]  Multiple Vitamin (MULTIVITAMIN) tablet Take 1 tablet by mouth daily.    [provider]  Oxycodone HCl 20 MG TABS Take 1 tablet by mouth 5 (five) times daily. 09/01/19   [provider]  pantoprazole (PROTONIX) 40 MG tablet Take 40 mg by mouth daily.    [provider]  pravastatin (PRAVACHOL) 20 MG tablet Take 20 mg by mouth daily.    [provider]  Probiotic Product (PROBIOTIC PO) Take 1 capsule by mouth daily.    [provider]  Pyridoxine HCl (VITAMIN B-6) 250 MG tablet Take 250 mg by mouth daily.    [provider]  silver sulfADIAZINE (SILVADENE) 1 % cream Apply 1 application topically 2 (two) times daily.  01/15/18   [provider]  TOUJEO SOLOSTAR 300 UNIT/ML SOPN Inject 25-35 Units into the skin daily. Sliding Scale. 10/30/17   [provider]  vitamin B-12 (CYANOCOBALAMIN) 1000  MCG tablet Take 1,000 mcg by mouth daily.    [provider]    Physical Exam: Vitals:   08/31/22 1745 08/31/22 1810 08/31/22 1900 08/31/22 1957  BP: 98/79 (!) 88/53  (!) 95/53  Pulse: 71 72  (!) 124  Resp: '13 13  20  '$ Temp:   99 F (37.2 C) 98.3 F (36.8 C)  TempSrc:    Oral  SpO2: 100% 96%  95%  Weight: 117.7 kg     Height: '5\' 6"'$  (1.676 m)      Constitutional: Obese woman resting in bed.  NAD, calm, comfortable Eyes: EOMI, lids and conjunctivae normal ENMT: Mucous membranes are moist. Posterior pharynx clear of any exudate or lesions.Normal dentition.  Neck: normal, supple, no masses. Respiratory: clear to auscultation anteriorly. Normal respiratory effort. No accessory muscle use.  Cardiovascular: Regular rate and rhythm, no murmurs / rubs / gallops. No extremity edema. 2+ pedal pulses. Abdomen: Colostomy in place with pink stoma.  No tenderness, no masses palpated. Musculoskeletal: ROM diminished bilateral lower extremities, contracture left hand Skin: Large sacral and buttocks pressure wound with superficial skin loss and bloody discharge as pictured below.  No deep wound identified. Neurologic: Sensation intact. Strength 5/5 upper extremities with exception of left hand contracture.  Strength 0-1/5 bilateral lower extremities Psychiatric: Alert and oriented x 3. Normal mood.       EKG: Personally reviewed. Sinus rhythm, rate 76, RBBB, no acute ischemic changes.  Similar to prior.  Assessment/Plan Principal Problem:   Urinary tract infection associated with indwelling urethral catheter, initial encounter ([place]) Active Problems:   Acute encephalopathy   Stage IV pressure ulcer of sacral region ([place])   Chronic pain   Diabetes mellitus type 2 with complications ([place])   Hyperlipidemia associated with type 2 diabetes mellitus ([place])   Hypothyroidism   Hypokalemia   Hypotension   Natalie Macdonald is a 67 y.o. female with medical history significant for paraplegia  with bedbound status, history of CVA, chronic sacral pressure wound, chronic indwelling Foley catheter with recurrent UTI on suppressive Keflex, s/p diverting colostomy, insulin-dependent T2DM, HTN, HLD, hypothyroidism, cervical spinal stenosis s/p ACDF, chronic pain on long-term narcotics who is admitted with acute metabolic encephalopathy in setting of UTI.  Assessment and Plan: UTI associated with chronic  indwelling Foley catheter: Patient with history of recurrent UTI in setting of chronic indwelling Foley catheter on suppressive Keflex.  Presenting with encephalopathy and hypotension again concerning for UTI. -Continue empiric IV ceftriaxone -Follow blood cultures, add on urine culture -Recommend Foley catheter exchange prior to discharge (per daughter current catheter has been placed about 1 month)  Acute metabolic encephalopathy: Confusion and combative behavior at home which has now improved at time of admission after initiation of antibiotics and IV fluid hydration.  Chronic large sacral/buttocks pressure wound: Large area of superficial skin loss with bleeding.  No obvious deep wound or purulent discharge seen.  Per daughter, wounds have been slowly improving over the past year plus. -Consult to wound care  Hypotension with history of hypertension: Has been hypotensive in the ED in setting of UTI and likely dehydration from decreased PO intake. -Continue IV fluid hydration overnight -Hold home Coreg and amlodipine-benazepril  Insulin-dependent type 2 diabetes: Continue home Toujeo 60 units nightly plus sliding scale insulin.  S/p diverting colostomy: Functioning well per patient and daughter.  Continue ostomy care.  Hypokalemia: Oral supplement ordered.  Hypothyroidism: Continue Synthroid.  Hyperlipidemia: Continue pravastatin.  Chronic pain syndrome on long-term narcotics: On chronic scheduled morphine and Oxy IR.  Her encephalopathy has resolved.  We will resume her  usual dose pain meds but discussed with patient and daughter that if encephalopathy recurs in association with narcotic meds then we will need to consider adjustments. -Continue MS Contin 15 mg every 8 hours -Continue oxycodone 20 mg 5 times daily -Continue gabapentin 800 mg 3 times daily   DVT prophylaxis: Lovenox SQ Code Status: Full code, confirmed with patient on admission Family Communication: Daughter at bedside Disposition Plan: From home and likely discharge to home pending clinical progress Consults called: None Severity of Illness: The appropriate patient status for this patient is OBSERVATION. Observation status is judged to be reasonable and necessary in order to provide the required intensity of service to ensure the patient's safety. The patient's presenting symptoms, physical exam findings, and initial radiographic and laboratory data in the context of their medical condition is felt to place them at decreased risk for further clinical deterioration. Furthermore, it is anticipated that the patient will be medically stable for discharge from the hospital within 2 midnights of admission.   Zada Finders MD [facility]  If 7PM-7AM, please contact night-coverage [email or web address]  08/31/2022, 8:13 PM

## 2022-09-01 DIAGNOSIS — T83511A Infection and inflammatory reaction due to indwelling urethral catheter, initial encounter: Secondary | ICD-10-CM | POA: Diagnosis not present

## 2022-09-01 DIAGNOSIS — N390 Urinary tract infection, site not specified: Secondary | ICD-10-CM | POA: Diagnosis not present

## 2022-09-01 LAB — URINALYSIS, ROUTINE W REFLEX MICROSCOPIC
Bilirubin Urine: NEGATIVE
Glucose, UA: NEGATIVE mg/dL
Ketones, ur: NEGATIVE mg/dL
Nitrite: NEGATIVE
Specific Gravity, Urine: 1.01 (ref 1.005–1.030)
pH: 6 (ref 5.0–8.0)

## 2022-09-01 LAB — BASIC METABOLIC PANEL
Anion gap: 9 (ref 5–15)
BUN: 15 mg/dL (ref 8–23)
CO2: 19 mmol/L — ABNORMAL LOW (ref 22–32)
Calcium: 8.1 mg/dL — ABNORMAL LOW (ref 8.9–10.3)
Chloride: 105 mmol/L (ref 98–111)
Creatinine, Ser: 0.83 mg/dL (ref 0.44–1.00)
GFR, Estimated: >60 mL/min (ref >60)
Glucose, Bld: 298 mg/dL — ABNORMAL HIGH (ref 70–99)
Potassium: 3.9 mmol/L (ref 3.5–5.1)
Sodium: 133 mmol/L — ABNORMAL LOW (ref 135–145)

## 2022-09-01 LAB — URINALYSIS, MICROSCOPIC (REFLEX): Squamous Epithelial / HPF: NONE SEEN /HPF (ref 0–5)

## 2022-09-01 LAB — CBC
HCT: 33 % — ABNORMAL LOW (ref 36.0–46.0)
Hemoglobin: 10.5 g/dL — ABNORMAL LOW (ref 12.0–15.0)
MCH: 28.9 pg (ref 26.0–34.0)
MCHC: 31.8 g/dL (ref 30.0–36.0)
MCV: 90.9 fL (ref 80.0–100.0)
Platelets: 178 10*3/uL (ref 150–400)
RBC: 3.63 MIL/uL — ABNORMAL LOW (ref 3.87–5.11)
RDW: 14.1 % (ref 11.5–15.5)
WBC: 7.8 10*3/uL (ref 4.0–10.5)
nRBC: 0 % (ref 0.0–0.2)

## 2022-09-01 LAB — GLUCOSE, CAPILLARY
Glucose-Capillary: 318 mg/dL — ABNORMAL HIGH (ref 70–99)
Glucose-Capillary: 325 mg/dL — ABNORMAL HIGH (ref 70–99)
Glucose-Capillary: 245 mg/dL — ABNORMAL HIGH (ref 70–99)
Glucose-Capillary: 207 mg/dL — ABNORMAL HIGH (ref 70–99)
Glucose-Capillary: 126 mg/dL — ABNORMAL HIGH (ref 70–99)

## 2022-09-01 LAB — HIV ANTIBODY (ROUTINE TESTING W REFLEX): HIV Screen 4th Generation wRfx: NONREACTIVE

## 2022-09-01 LAB — SARS CORONAVIRUS 2 BY RT PCR: SARS Coronavirus 2 by RT PCR: POSITIVE — AB

## 2022-09-01 MED ORDER — SILVER SULFADIAZINE 1 % EX CREA
1.0000 | TOPICAL_CREAM | CUTANEOUS | Status: DC | PRN
  Administered 2022-09-02: 1 via TOPICAL
  Filled 2022-09-01: qty 50

## 2022-09-01 MED ORDER — PSYLLIUM 95 % PO PACK
1.0000 | PACK | Freq: Every day | ORAL | Status: DC
  Administered 2022-09-02: 1 via ORAL
  Filled 2022-09-01 (×3): qty 1

## 2022-09-01 MED ORDER — ADULT MULTIVITAMIN W/MINERALS CH
1.0000 | ORAL_TABLET | Freq: Every day | ORAL | Status: DC
  Administered 2022-09-02 – 2022-09-03 (×2): 1 via ORAL
  Filled 2022-09-01 (×2): qty 1

## 2022-09-01 MED ORDER — INSULIN GLULISINE 100 UNIT/ML SOLOSTAR PEN: 22.0000 [IU] | PEN_INJECTOR | Freq: Three times a day (TID) | SUBCUTANEOUS | Status: DC | PRN

## 2022-09-01 MED ORDER — SACCHAROMYCES BOULARDII 250 MG PO CAPS
250.0000 mg | ORAL_CAPSULE | Freq: Every day | ORAL | Status: DC
  Administered 2022-09-01 – 2022-09-03 (×3): 250 mg via ORAL
  Filled 2022-09-01 (×3): qty 1

## 2022-09-01 MED ORDER — LACTATED RINGERS IV SOLN: INTRAVENOUS | Status: DC

## 2022-09-01 MED ORDER — VITAMIN D 25 MCG (1000 UNIT) PO TABS
1000.0000 [IU] | ORAL_TABLET | Freq: Every day | ORAL | Status: DC
  Administered 2022-09-01 – 2022-09-03 (×3): 1000 [IU] via ORAL
  Filled 2022-09-01 (×3): qty 1

## 2022-09-01 MED ORDER — NYSTATIN 100000 UNIT/GM EX POWD
Freq: Two times a day (BID) | CUTANEOUS | Status: DC
  Filled 2022-09-01: qty 15

## 2022-09-01 MED ORDER — CYANOCOBALAMIN 500 MCG PO TABS
500.0000 ug | ORAL_TABLET | Freq: Every day | ORAL | Status: DC
  Administered 2022-09-01 – 2022-09-03 (×3): 500 ug via ORAL
  Filled 2022-09-01 (×3): qty 1

## 2022-09-01 MED ORDER — INSULIN GLULISINE 100 UNIT/ML SOLOSTAR PEN: 0.0000 [IU] | PEN_INJECTOR | Freq: Three times a day (TID) | SUBCUTANEOUS | Status: DC

## 2022-09-01 MED ORDER — FLUTICASONE PROPIONATE 50 MCG/ACT NA SUSP
2.0000 | Freq: Every day | NASAL | Status: DC | PRN
  Administered 2022-09-02: 2 via NASAL
  Filled 2022-09-01: qty 16

## 2022-09-01 MED ORDER — DICLOFENAC SODIUM 1 % EX GEL: 2.0000 g | Freq: Every day | CUTANEOUS | Status: DC | PRN

## 2022-09-01 NOTE — Progress Notes (Addendum)
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 + Charcot joint Hypothyroid, HLD, hypotension in the setting of her quadriplegia, (prior hypertension)  Last admit [id number] toxic metabolic encephalopathy 2/2 presumed pyelonephritis symptomatic hyponatremia-completed antibiotic course discharged home and was placed on suppressive Keflex after this visit by PCP  ED visit 06/28/2022 reviewed multiple colony-forming units Corynebacterium Rx cefpodoxime/Bactrim  Brought by daughter 2/29 to emergency room 2/2 dysuria dark urine encephalopathy (recently doubled Keflex 250-->500 daily--PCP had prescribed apparently 1 week prior to admission)--- she became apparently combative on 2/29 EMS noted she was lethargic (also?  Change in dose of amitriptyline?)  BP 106/54 pulse rate 70s Tmax 99.0 UA (catheter had not been changed in a month) negative nitrites moderate leukocytes >50 RBC WBC many bacteria 2 VW CXR = negative  ED Rx saline 2 L ceftriaxone 2 g   Hospital-Problem based course  Toxic metabolic encephalopathy on admission from presumed urinary infection - Current Foley catheter to be changed this morning, fresh UA sample to be sent-had not been changed in over 30 days according to daughter who discussed this with me - Continue therapeutic ceftriaxone 2 g daily ( likely need dose adjustment) -Currently on amitriptyline 100 at bedtime - same dose as prior dosing back in October 2022 so would not adjust currently instead monitor mentation -Is on high-dose MS IR 15 mg every 8, Oxy IR 25 times daily, Atarax 10 at bedtime, gabapentin  800 3 times daily which can all contribute to altered sensorium-will need to discuss with patient/family de-escalation for safety - Continue LR drop rate to 75 cc/H, follow blood culture from admission  Chronic pain prior sacral decubitus Charcot joints of both lower extremities Prior ACDF 2018 with residual vocal cord paralysis and chronic left hemiparesis - See above-quite recalcitrant and probably unable to lower doses of meds - Obtain OT PT input although suspect patient is at her baseline-patient usually can turn in the bed but cannot raise herself up higher than about 30 degrees because of pain in the sacral area -Patient's diverting ostomy is functioning well with regular stool in  DM TY 2 - Home meds reviewed patient placed back on APidra but at much lower dose (30 at home)-->18 3 times daily - Continue Toujeo at current dose of 60 units at bedtime along with resistant SSI - CBG = 193-318  Hypokalemia on admission - Resolved with K-Dur 40-a.m. labs  Hypotension in setting of hypertension with probable dysautonomia - Coreg + Lotrel have been held  Chronic sacral decubitus - Wound care has seen-wound is not appear infected continue silver sulfadiazine, Aquacel dressings and turn as frequently as able  Intertriginous MASD - Start nystatin powder to inner thighs, placed towel to prevent moisture accumulation  BMI 41 - Does have habitus for OHSS and may require outpatient split-night study  Continuing Synthroid at PTA dose  DVT prophylaxis: Lovenox Code Status: Full Family Communication: Discussed with daughter Corena Pilgrim in person at the bedside on 3/1 Disposition:  Status is: Observation The patient will require care spanning > 2 midnights and should be moved to inpatient because:   Needs fresh urine sample needs blood cultures follow  needs IV antibiotics suspect will need at least 48 hours hospitalization    Subjective: Awake coherent not confused pleasant  appearing Nursing reports upset about not getting her usual type of insulin Otherwise she is doing fair When I questioned her and asked her about her resistance to test for COVID or take the shot she tells me that she is now willing-it is unclear what the resistance was from previously She does not have any sensation really below mid thigh-she does have sensation in her abdomen and in the areas around her decubiti which were not reviewed by me today ([place] nurse had just seen the wounds) - Overall she seems to be at her usual baseline  Objective: Vitals:   08/31/22 2315 09/01/22 0039 09/01/22 0100 09/01/22 0513  BP: (!) 100/56 (!) 122/59  130/61  Pulse: 79 85  86  Resp: '12 20  18  '$ Temp:  98.4 F (36.9 C)  98.8 F (37.1 C)  TempSrc:  Oral  Oral  SpO2: 100% 100%  98%  Weight:   117.5 kg   Height:   '5\' 6"'$  (1.676 m)     Intake/Output Summary (Last 24 hours) at 09/01/2022 0737 Last data filed at 09/01/2022 0600 Gross per 24 hour  Intake 1990.82 ml  Output 2600 ml  Net -609.18 ml   Filed Weights   08/31/22 1745 09/01/22 0100  Weight: 117.7 kg 117.5 kg    Examination:  EOMI NCAT no focal deficit, Mallampati 4, very poor dentition, cannot appreciate any submandibular swelling, smile is symmetric S1-S2 no murmur-telemetry reviewed shows NSR with sinus tach at times no arrhythmia Abdomen is soft slightly distended ostomy is in left lower quadrant She does have an indwelling Foley in place There is intertriginous MASD/fungal intertrigo by exam Lower extremities are slightly swollen and there does seem to be some venous stasis in the feet, 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 She is able to straight leg raise with much resistance on the right side and is unable to move her left leg at all-I deferred further neurological evaluation  Data Reviewed: personally reviewed   CBC    Component Value Date/Time   WBC 7.8 09/01/2022 0504   RBC 3.63 (L) 09/01/2022  0504   HGB 10.5 (L) 09/01/2022 0504   HCT 33.0 (L) 09/01/2022 0504   PLT 178 09/01/2022 0504   MCV 90.9 09/01/2022 0504   MCH 28.9 09/01/2022 0504   MCHC 31.8 09/01/2022 0504   RDW 14.1 09/01/2022 0504   LYMPHSABS 2.6 08/31/2022 1615   MONOABS 0.7 08/31/2022 1615   EOSABS 0.2 08/31/2022 1615   BASOSABS 0.0 08/31/2022 1615      Latest Ref Rng & Units 09/01/2022    5:04 AM 08/31/2022    4:15 PM 06/28/2022    7:20 PM  CMP  Glucose 70 - 99 mg/dL 298  149  195   BUN 8 - 23 mg/dL '15  20  15   '$ Creatinine 0.44 - 1.00 mg/dL 0.83  0.86  1.12   Sodium 135 - 145 mmol/L 133  131  133   Potassium 3.5 - 5.1 mmol/L 3.9  3.2  3.9   Chloride 98 - 111 mmol/L 105  99  100   CO2 22 - 32 mmol/L '19  22  23   '$ Calcium 8.9 - 10.3 mg/dL 8.1  8.0  8.8   Total Protein 6.5 - 8.1 g/dL  6.5  6.9   Total Bilirubin 0.3 - 1.2  mg/dL  0.6  0.6   Alkaline Phos 38 - 126 U/L  66  61   AST 15 - 41 U/L  31  30   ALT 0 - 44 U/L  18  26      Radiology Studies: DG Chest 2 View  Result Date: 08/31/2022 CLINICAL DATA:  Suspected Sepsis EXAM: CHEST - 2 VIEW COMPARISON:  Chest x-ray 06/28/2022. FINDINGS: Similar cardiomegaly. No consolidation. No visible pleural effusions or pneumothorax. No acute osseous abnormality. Partially imaged ACDF. IMPRESSION: Similar cardiomegaly.  No consolidation. Electronically Signed   By: Margaretha Sheffield M.D.   On: 08/31/2022 16:27     Scheduled Meds:  amitriptyline  100 mg Oral QHS   cholecalciferol  1,000 Units Oral Daily   cyanocobalamin  500 mcg Oral Daily   enoxaparin (LOVENOX) injection  0.5 mg/kg Subcutaneous Q24H   gabapentin  800 mg Oral TID   hydrOXYzine  10 mg Oral QHS   insulin aspart  0-20 Units Subcutaneous TID WC   insulin glargine-yfgn  60 Units Subcutaneous QHS   levothyroxine  168 mcg Oral QAC breakfast   montelukast  10 mg Oral QHS   morphine  15 mg Oral Q8H   [START ON 09/02/2022] multivitamin with minerals  1 tablet Oral Q breakfast   nystatin   Topical BID    oxyCODONE  20 mg Oral 5 X Daily   pantoprazole  40 mg Oral Daily   pravastatin  20 mg Oral Daily   psyllium  1 packet Oral Daily   saccharomyces boulardii  250 mg Oral Daily   silver sulfADIAZINE  1 Application Topical See admin instructions   sodium chloride flush  3 mL Intravenous Q12H   Continuous Infusions:  cefTRIAXone (ROCEPHIN)  IV Stopped (08/31/22 1718)   lactated ringers 150 mL/hr at 09/01/22 0147     LOS: 0 days   Time spent: [place], MD [facility] To contact the attending provider between 7A-7P or the covering provider during after hours 7P-7A, please log into the web site [email or web address] and access using universal [place] password for that web site. If you do not have the password, please call the hospital operator.  09/01/2022, 7:37 AM

## 2022-09-01 NOTE — Progress Notes (Signed)
PT Cancellation Note  Patient Details Name: Natalie Macdonald MRN: [id number] DOB: 1955-12-04   Cancelled Treatment:    Reason Eval/Treat Not Completed: PT screened, no needs identified, will sign off Per OT, pt is at her baseline, bedbound and total care.  Please refer to OT evaluation.  PT to sign off at this time.   Myrtis Hopping [place] 09/01/2022, 4:26 PM Jannette Spanner PT, DPT Physical Therapist [facility] Preferred contact method: Secure Chat Weekend Pager Only: [phone] Office: [phone]

## 2022-09-01 NOTE — Progress Notes (Signed)
Note COVID + Patient completely asymptomatic, but encephalopathy could be explained by + test  Hold Steroids.work up unless changes in sensorium or status  Verneita Griffes, MD [facility] 6:19 PM

## 2022-09-01 NOTE — Progress Notes (Signed)
Per patient, MD gave permission for patient's daughter to insert foley catheter for catheter exchange. Patient left message for daughter this morning and MD spoke with daughter on the phone. MD confirming that he is agreeable to daughter inserting patient's foley. Daughter has not shown up at time of this note.

## 2022-09-01 NOTE — Evaluation (Signed)
Occupational Therapy Evaluation Patient Details Name: Natalie Macdonald MRN: [id number] DOB: Jan 19, 1956 Today's Date: 09/01/2022   History of Present Illness Natalie Macdonald is a 67 y.o. female with medical history significant for paraplegia with bedbound status, history of CVA, chronic sacral pressure wound, chronic indwelling Foley catheter with recurrent UTI on suppressive Keflex, s/p diverting colostomy, insulin-dependent T2DM, HTN, HLD, hypothyroidism, cervical spinal stenosis s/p ACDF, chronic pain on long-term narcotics who presented to the ED for evaluation of altered mental status.   Clinical Impression   Natalie Macdonald is a 67 year old woman who presents with above medical history. On evaluation she presents with pain in back and buttocks, chronic left sided hemiparesis  and LE edema. Patient is bedbound at [place]. She can assist with grooming and UB ADLs using right upper extremity but is total assist for toileting, LB dressing and bed mobility. Patient reports she is at there baseline. She has a hoyer at home but doesn't get out of bed. She reports "MD comes to me." Patient at baseline and has no therapy needs. Will need to return home with 24/7 physical assist.      Recommendations for follow up therapy are one component of a multi-disciplinary discharge planning process, led by the attending physician.  Recommendations may be updated based on patient status, additional functional criteria and insurance authorization.   Follow Up Recommendations  No OT follow up     Assistance Recommended at Discharge Frequent or constant Supervision/Assistance  Patient can return home with the following A lot of help with bathing/dressing/bathroom;Assistance with cooking/housework;Direct supervision/assist for medications management;Direct supervision/assist for financial management    Functional Status Assessment  Patient has not had a recent decline in their functional status  Equipment  Recommendations  None recommended by OT    Recommendations for Other Services       Precautions / Restrictions Precautions Precaution Comments: Left sided hemiparesis Restrictions Weight Bearing Restrictions: No      Mobility Bed Mobility               General bed mobility comments: Total assist for rolling and bed mobility. Reports she doesn't get out of bed    Transfers                          Balance                                           ADL either performed or assessed with clinical judgement   ADL Overall ADL's : At baseline                                       General ADL Comments: Near total assist. She can feed herself with setup, assist wtih grooming and UB bathing, lift right arm for dressing othewise total care     Vision Patient Visual Report: No change from baseline       Perception     Praxis      Pertinent Vitals/Pain Pain Assessment Pain Assessment: Faces Faces Pain Scale: Hurts little more Pain Location: L buttock Pain Descriptors / Indicators: Aching, Grimacing Pain Intervention(s): Repositioned     Hand Dominance Right   Extremity/Trunk Assessment Upper Extremity Assessment Upper Extremity Assessment: RUE deficits/detail;LUE deficits/detail RUE Deficits /  Details: WFL ROM and strength RUE Sensation: WNL RUE Coordination: WNL LUE Deficits / Details: Decreased ROM and strength from prior stroke, able to move thumb, DIPS and PIPs flexed and tight. Can wiggle wrist but locks ROM, can grossly flex and extend elbow but lacks ROM, able to  grossly abduct but not to shoulder height           Communication Communication Communication: No difficulties   Cognition Arousal/Alertness: Awake/alert Behavior During Therapy: WFL for tasks assessed/performed Overall Cognitive Status: Within Functional Limits for tasks assessed                                        General Comments       Exercises     Shoulder Instructions      Home Living Family/patient expects to be discharged to:: Private residence Living Arrangements: Children Available Help at Discharge: Available 24 hours/day Type of Home: House Home Access: Stairs to enter [employer] of Steps: 2   Home Layout: Two [place]: Conservation officer, nature (2 wheels);Rollator (4 wheels);BSC/3in1;Hospital bed   Additional Comments: hoyer lift and chair lift      Prior Functioning/Environment               Mobility Comments: non ambulatory ADLs Comments: assist with ADLs, can assist with R hand for feeding, grooming upper body bathing        OT Problem List:        OT Treatment/Interventions:      OT Goals(Current goals can be found in the care plan section) Acute Rehab OT Goals OT Goal Formulation: All assessment and education complete, DC therapy  OT Frequency:      Co-evaluation              AM-PAC OT "6 Clicks" Daily Activity     Outcome Measure Help from another person eating meals?: A Little Help from another person taking care of personal grooming?: A Little Help from another person toileting, which includes using toliet, bedpan, or urinal?: Total Help from another person bathing (including washing, rinsing, drying)?: A Lot Help from another person to put on and taking off regular upper body clothing?: A Lot Help from another person to put on and taking off regular lower body clothing?: Total 6 Click Score: 12   End of Session Nurse Communication: Other (comment) (requests tylenol)  Activity Tolerance: Patient tolerated treatment well Patient left: in bed;with call bell/phone within reach;with bed alarm set  OT Visit Diagnosis: Other symptoms and signs involving cognitive function                Time: [id number] OT Time Calculation (min): 9 min Charges:  OT General Charges $OT Visit: 1 Visit OT Evaluation $OT Eval  Low Complexity: 1 Low  Gustavo Lah, OTR/L [place]  Office [phone]   Lenward Chancellor 09/01/2022, 3:16 PM

## 2022-09-01 NOTE — Consult Note (Addendum)
[facility] [facility] Nurse Consult Note: Reason for Consult: Consult requested for sacrum and buttocks.  Pt states she has been followed in the past by the outpatient wound care center, and has home health assistance for dressing change assistance.  Left buttock with chronic Stage 3 pressure injury; approx 5X5X.6cm, red and moist, large amt pink drainage, outer wound with small amt bleeding Sacrum with chronic Stage 4 pressure injury; 6X5.5X.6cm, same appearance.  Right buttock with healed pink scar tissue. Pressure Injury POA: Yes Dressing procedure/placement/frequency: Air mattress ordered to reduce pressure and increase airflow to the affected areas. Topical treatment orders provided for bedside nurses to perform as follows to absorb drainage and provide antimicrobial benefits: Apply Aquacel to left buttock/sacrum wound Q day, then cover with ABD pads and tape.  WOC Ostomy Consult note: Pt has a colostomy pouch which is intact with good seal, mod amt semi formed brown stool.  She states she uses a Convatec one piece pouch with barrier ring.  Informed her we do not carry that product in the [place] and we will substitute a similar product. Left a barrier ring, Lawson # [phone] and one piece [place]ister pouch Lawson # [phone] at the bedside for staff nurse's use.   Please re-consult if further assistance is needed.  Thank-you,  Julien Girt MSN, [place], [place], [place], [place]

## 2022-09-02 DIAGNOSIS — Y846 Urinary catheterization as the cause of abnormal reaction of the patient, or of later complication, without mention of misadventure at the time of the procedure: Secondary | ICD-10-CM | POA: Diagnosis not present

## 2022-09-02 DIAGNOSIS — U071 COVID-19: Secondary | ICD-10-CM | POA: Diagnosis not present

## 2022-09-02 DIAGNOSIS — E860 Dehydration: Secondary | ICD-10-CM | POA: Diagnosis not present

## 2022-09-02 DIAGNOSIS — E876 Hypokalemia: Secondary | ICD-10-CM | POA: Diagnosis not present

## 2022-09-02 DIAGNOSIS — Z933 Colostomy status: Secondary | ICD-10-CM | POA: Diagnosis not present

## 2022-09-02 DIAGNOSIS — E871 Hypo-osmolality and hyponatremia: Secondary | ICD-10-CM | POA: Diagnosis not present

## 2022-09-02 DIAGNOSIS — Z743 Need for continuous supervision: Secondary | ICD-10-CM | POA: Diagnosis not present

## 2022-09-02 DIAGNOSIS — R531 Weakness: Secondary | ICD-10-CM | POA: Diagnosis not present

## 2022-09-02 DIAGNOSIS — E1169 Type 2 diabetes mellitus with other specified complication: Secondary | ICD-10-CM | POA: Diagnosis not present

## 2022-09-02 DIAGNOSIS — R4182 Altered mental status, unspecified: Secondary | ICD-10-CM | POA: Diagnosis not present

## 2022-09-02 DIAGNOSIS — E11610 Type 2 diabetes mellitus with diabetic neuropathic arthropathy: Secondary | ICD-10-CM | POA: Diagnosis not present

## 2022-09-02 DIAGNOSIS — G894 Chronic pain syndrome: Secondary | ICD-10-CM | POA: Diagnosis not present

## 2022-09-02 DIAGNOSIS — I10 Essential (primary) hypertension: Secondary | ICD-10-CM | POA: Diagnosis not present

## 2022-09-02 DIAGNOSIS — G901 Familial dysautonomia [Riley-Day]: Secondary | ICD-10-CM | POA: Diagnosis not present

## 2022-09-02 DIAGNOSIS — G8250 Quadriplegia, unspecified: Secondary | ICD-10-CM | POA: Diagnosis not present

## 2022-09-02 DIAGNOSIS — Z79899 Other long term (current) drug therapy: Secondary | ICD-10-CM | POA: Diagnosis not present

## 2022-09-02 DIAGNOSIS — Z8616 Personal history of COVID-19: Secondary | ICD-10-CM | POA: Diagnosis not present

## 2022-09-02 DIAGNOSIS — R609 Edema, unspecified: Secondary | ICD-10-CM | POA: Diagnosis not present

## 2022-09-02 DIAGNOSIS — Z7401 Bed confinement status: Secondary | ICD-10-CM | POA: Diagnosis not present

## 2022-09-02 DIAGNOSIS — Z6841 Body Mass Index (BMI) 40.0 and over, adult: Secondary | ICD-10-CM | POA: Diagnosis not present

## 2022-09-02 DIAGNOSIS — Z794 Long term (current) use of insulin: Secondary | ICD-10-CM | POA: Diagnosis not present

## 2022-09-02 DIAGNOSIS — N390 Urinary tract infection, site not specified: Secondary | ICD-10-CM | POA: Diagnosis present

## 2022-09-02 DIAGNOSIS — E785 Hyperlipidemia, unspecified: Secondary | ICD-10-CM | POA: Diagnosis not present

## 2022-09-02 DIAGNOSIS — M797 Fibromyalgia: Secondary | ICD-10-CM | POA: Diagnosis not present

## 2022-09-02 DIAGNOSIS — G928 Other toxic encephalopathy: Secondary | ICD-10-CM | POA: Diagnosis not present

## 2022-09-02 DIAGNOSIS — E11649 Type 2 diabetes mellitus with hypoglycemia without coma: Secondary | ICD-10-CM | POA: Diagnosis not present

## 2022-09-02 DIAGNOSIS — T83511A Infection and inflammatory reaction due to indwelling urethral catheter, initial encounter: Secondary | ICD-10-CM | POA: Diagnosis not present

## 2022-09-02 DIAGNOSIS — E039 Hypothyroidism, unspecified: Secondary | ICD-10-CM | POA: Diagnosis not present

## 2022-09-02 DIAGNOSIS — L89323 Pressure ulcer of left buttock, stage 3: Secondary | ICD-10-CM | POA: Diagnosis not present

## 2022-09-02 DIAGNOSIS — K219 Gastro-esophageal reflux disease without esophagitis: Secondary | ICD-10-CM | POA: Diagnosis not present

## 2022-09-02 DIAGNOSIS — I9589 Other hypotension: Secondary | ICD-10-CM | POA: Diagnosis not present

## 2022-09-02 LAB — CBC
HCT: 31.7 % — ABNORMAL LOW (ref 36.0–46.0)
Hemoglobin: 9.9 g/dL — ABNORMAL LOW (ref 12.0–15.0)
MCH: 28.9 pg (ref 26.0–34.0)
MCHC: 31.2 g/dL (ref 30.0–36.0)
MCV: 92.4 fL (ref 80.0–100.0)
Platelets: 214 10*3/uL (ref 150–400)
RBC: 3.43 MIL/uL — ABNORMAL LOW (ref 3.87–5.11)
RDW: 14.3 % (ref 11.5–15.5)
WBC: 7.7 10*3/uL (ref 4.0–10.5)
nRBC: 0 % (ref 0.0–0.2)

## 2022-09-02 LAB — COMPREHENSIVE METABOLIC PANEL
ALT: 16 U/L (ref 0–44)
AST: 18 U/L (ref 15–41)
Albumin: 2.7 g/dL — ABNORMAL LOW (ref 3.5–5.0)
Alkaline Phosphatase: 51 U/L (ref 38–126)
Anion gap: 8 (ref 5–15)
BUN: 7 mg/dL — ABNORMAL LOW (ref 8–23)
CO2: 24 mmol/L (ref 22–32)
Calcium: 8.7 mg/dL — ABNORMAL LOW (ref 8.9–10.3)
Chloride: 105 mmol/L (ref 98–111)
Creatinine, Ser: 0.51 mg/dL (ref 0.44–1.00)
GFR, Estimated: >60 mL/min (ref >60)
Glucose, Bld: 72 mg/dL (ref 70–99)
Potassium: 3.5 mmol/L (ref 3.5–5.1)
Sodium: 137 mmol/L (ref 135–145)
Total Bilirubin: 0.2 mg/dL — ABNORMAL LOW (ref 0.3–1.2)
Total Protein: 6 g/dL — ABNORMAL LOW (ref 6.5–8.1)

## 2022-09-02 LAB — GLUCOSE, CAPILLARY
Glucose-Capillary: 60 mg/dL — ABNORMAL LOW (ref 70–99)
Glucose-Capillary: 73 mg/dL (ref 70–99)
Glucose-Capillary: 114 mg/dL — ABNORMAL HIGH (ref 70–99)
Glucose-Capillary: 107 mg/dL — ABNORMAL HIGH (ref 70–99)
Glucose-Capillary: 160 mg/dL — ABNORMAL HIGH (ref 70–99)

## 2022-09-02 LAB — URINE CULTURE

## 2022-09-02 MED ORDER — CARVEDILOL 3.125 MG PO TABS
3.1250 mg | ORAL_TABLET | Freq: Two times a day (BID) | ORAL | Status: DC
  Administered 2022-09-02 – 2022-09-03 (×3): 3.125 mg via ORAL
  Filled 2022-09-02 (×3): qty 1

## 2022-09-02 MED ORDER — SALINE SPRAY 0.65 % NA SOLN
1.0000 | NASAL | Status: DC | PRN
  Filled 2022-09-02: qty 44

## 2022-09-02 MED ORDER — INSULIN ASPART 100 UNIT/ML IJ SOLN: 0.0000 [IU] | Freq: Three times a day (TID) | INTRAMUSCULAR | Status: DC

## 2022-09-02 MED ORDER — ALUM & MAG HYDROXIDE-SIMETH 200-200-20 MG/5ML PO SUSP
30.0000 mL | ORAL | Status: DC | PRN
  Administered 2022-09-02: 30 mL via ORAL
  Filled 2022-09-02: qty 30

## 2022-09-02 MED ORDER — INSULIN GLULISINE 100 UNIT/ML SOLOSTAR PEN
0.0000 [IU] | PEN_INJECTOR | Freq: Three times a day (TID) | SUBCUTANEOUS | Status: DC
  Administered 2022-09-03: 2 [IU] via SUBCUTANEOUS

## 2022-09-02 MED ORDER — INSULIN ASPART 100 UNIT/ML IJ SOLN: 0.0000 [IU] | Freq: Every day | INTRAMUSCULAR | Status: DC

## 2022-09-02 MED ORDER — INSULIN GLULISINE 100 UNIT/ML SOLOSTAR PEN: 8.0000 [IU] | PEN_INJECTOR | Freq: Three times a day (TID) | SUBCUTANEOUS | Status: DC

## 2022-09-02 MED ORDER — GABAPENTIN 300 MG PO CAPS
600.0000 mg | ORAL_CAPSULE | Freq: Three times a day (TID) | ORAL | Status: DC
  Administered 2022-09-02 – 2022-09-03 (×4): 600 mg via ORAL
  Filled 2022-09-02 (×4): qty 2

## 2022-09-02 NOTE — Progress Notes (Addendum)
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 + Charcot joint Hypothyroid, HLD, hypotension in the setting of her quadriplegia, (prior hypertension)  Last admit [id number] toxic metabolic encephalopathy 2/2 presumed pyelonephritis symptomatic hyponatremia-completed antibiotic course discharged home and was placed on suppressive Keflex after this visit by PCP  ED visit 06/28/2022 reviewed multiple colony-forming units Corynebacterium Rx cefpodoxime/Bactrim  Brought by daughter 2/29 to emergency room 2/2 dysuria dark urine encephalopathy (recently doubled Keflex 250-->500 daily--PCP had prescribed apparently 1 week prior to admission)--- she became apparently combative on 2/29 EMS noted she was lethargic (also?  Change in dose of amitriptyline?)  BP 106/54 pulse rate 70s Tmax 99.0 UA (catheter had not been changed in a month) negative nitrites moderate leukocytes >50 RBC WBC many bacteria 2 VW CXR = negative  ED Rx saline 2 L ceftriaxone 2 g  COVID found +, asymptomatic   Hospital-Problem based course  COVID-positive, coincidental--thinks she got this from a friend who visited her home about a week ago - No oxygen requirement not symptomatic at all - Obtain one-time D-dimer CRP in a.m.-if normal would not chase labs and would not treat - If abnormal will determine next steps  Toxic metabolic encephalopathy on admission from presumed urinary infection - Indwelling Foley changed on 3/1 UA = rare bacteria, urine culture from 3/1 pending and use that as sample to determine if treatment is needed not the prior - Continue  therapeutic ceftriaxone 2 g daily ( - Currently on amitriptyline 100 at bedtime - same dose as prior dosing back in October 2022- mentation intact - Continuing MSIR 15 mg every 8, Oxy IR 20 x 5 times daily, Atarax 10 at bedtime, gabapentin 800 3 times daily  - Will need to discuss with patient/family de-escalation for safety - Continue LR 75 cc/H - [facility] X2--2/29 NGTD  Chronic pain prior sacral decubitus Charcot joints of both lower extremities Prior ACDF 2018 with residual vocal cord paralysis and chronic left hemiparesis - PT OT we do not recommend any escalation of care-she is at usual pre-morbid baseline - Patient's diverting ostomy is functioning well   DM TY 2 with complication of hypoglycemia this morning 3/2-[daughter was recommending higher doses of apidra] - Home meds reviewed patient placed back on APidra--SSI with this - Continue Toujeo at current dose of 60 units at bedtime - CBG = 60-107  Hypokalemia on admission - Resolved with K-Dur 40  Hypotension in setting of hypertension with probable dysautonomia -  Lotrel held from admission and need to be resumed when she follows up in the outpatient setting - Coreg resumed at much lower dose of 3.125 twice daily and can be titrated back up in the outpatient setting  Chronic sacral decubitus - Wound care has seen-wound is not appear infected continue silver sulfadiazine, Aquacel dressings and turn as frequently as able  Intertriginous MASD - Start nystatin powder to inner thighs, placed towel to prevent moisture accumulation  BMI 41 - Does have habitus for OHSS and may require outpatient split-night study  Continuing Synthroid at PTA dose  DVT prophylaxis: Lovenox Code Status: Full Family Communication: No family present today 3/2--called Mykia [phone] Disposition:  Status is: Observation The patient will require care spanning > 2 midnights and should be moved to inpatient because:   blood cultures follow needs IV  antibiotics suspect will need at least 48 hours hospitalization    Subjective:  Doing fair no cough no cold no fever no sputum not on oxygen Tells me doing ok No dysuria  Objective: Vitals:   09/01/22 1135 09/01/22 2040 09/02/22 0529 09/02/22 1259  BP: (!) 113/53 127/63 138/75 (!) 107/47  Pulse: 86 88 76 72  Resp: '20 20 14 18  '$ Temp:  99.6 F (37.6 C) 98.2 F (36.8 C) (!) 97.5 F (36.4 C)  TempSrc:  Oral Oral Oral  SpO2: 98% 99% 97% 95%  Weight:      Height:        Intake/Output Summary (Last 24 hours) at 09/02/2022 1625 Last data filed at 09/02/2022 1137 Gross per 24 hour  Intake 1080 ml  Output 1400 ml  Net -320 ml    Filed Weights   08/31/22 1745 09/01/22 0100  Weight: 117.7 kg 117.5 kg    Examination:  EOMI NCAT no focal deficit, Mallampati 4, very poor dentition S1-S2 no murmur-telemetry reviewed shows NSR with sinus tach at times no arrhythmia Abdomen is soft slightly distended ostomy is in left lower quadrant She does have an indwelling Foley in place There is intertriginous MASD/fungal intertrigo by exam Lower extremities are slightly swollen  She is able to straight leg raise with much resistance on the right side and is unable to move her left leg at all-I deferred further neurological evaluation  Data Reviewed: personally reviewed   CBC    Component Value Date/Time   WBC 7.7 09/02/2022 0413   RBC 3.43 (L) 09/02/2022 0413   HGB 9.9 (L) 09/02/2022 0413   HCT 31.7 (L) 09/02/2022 0413   PLT 214 09/02/2022 0413   MCV 92.4 09/02/2022 0413   MCH 28.9 09/02/2022 0413   MCHC 31.2 09/02/2022 0413   RDW 14.3 09/02/2022 0413   LYMPHSABS 2.6 08/31/2022 1615   MONOABS 0.7 08/31/2022 1615   EOSABS 0.2 08/31/2022 1615   BASOSABS 0.0 08/31/2022 1615      Latest Ref Rng & Units 09/02/2022    4:13 AM 09/01/2022    5:04 AM 08/31/2022    4:15 PM  CMP  Glucose 70 - 99 mg/dL 72  298  149   BUN 8 - 23 mg/dL '7  15  20   '$ Creatinine 0.44 - 1.00 mg/dL 0.51  0.83  0.86    Sodium 135 - 145 mmol/L 137  133  131   Potassium 3.5 - 5.1 mmol/L 3.5  3.9  3.2   Chloride 98 - 111 mmol/L 105  105  99   CO2 22 - 32 mmol/L '24  19  22   '$ Calcium 8.9 - 10.3 mg/dL 8.7  8.1  8.0   Total Protein 6.5 - 8.1 g/dL 6.0   6.5   Total Bilirubin 0.3 - 1.2 mg/dL 0.2   0.6   Alkaline Phos 38 - 126 U/L 51   66   AST 15 - 41 U/L 18   31   ALT 0 - 44 U/L 16   18      Radiology Studies: No results found.   Scheduled Meds:  amitriptyline  100 mg Oral QHS   cholecalciferol  1,000 Units Oral Daily   cyanocobalamin  500 mcg Oral Daily   enoxaparin (LOVENOX) injection  0.5 mg/kg Subcutaneous Q24H   gabapentin  800 mg Oral TID   hydrOXYzine  10 mg Oral QHS   insulin glargine-yfgn  60 Units Subcutaneous QHS   insulin glulisine  0-20 Units Subcutaneous TID AC   levothyroxine  168 mcg Oral QAC breakfast   montelukast  10 mg Oral QHS   morphine  15 mg Oral Q8H   multivitamin with minerals  1 tablet Oral Q breakfast   nystatin   Topical BID   oxyCODONE  20 mg Oral 5 X Daily   pantoprazole  40 mg Oral Daily   pravastatin  20 mg Oral Daily   psyllium  1 packet Oral Daily   saccharomyces boulardii  250 mg Oral Daily   sodium chloride flush  3 mL Intravenous Q12H   Continuous Infusions:  cefTRIAXone (ROCEPHIN)  IV 2 g (09/02/22 1546)   lactated ringers 75 mL/hr at 09/02/22 1030     LOS: 0 days   Time spent: [place], MD [facility] To contact the attending provider between 7A-7P or the covering provider during after hours 7P-7A, please log into the web site [email or web address] and access using universal [place] password for that web site. If you do not have the password, please call the hospital operator.  09/02/2022, 4:25 PM

## 2022-09-03 ENCOUNTER — Inpatient Hospital Stay (HOSPITAL_COMMUNITY): Payer: Medicare HMO

## 2022-09-03 DIAGNOSIS — R609 Edema, unspecified: Secondary | ICD-10-CM

## 2022-09-03 DIAGNOSIS — N390 Urinary tract infection, site not specified: Secondary | ICD-10-CM | POA: Diagnosis not present

## 2022-09-03 DIAGNOSIS — T83511A Infection and inflammatory reaction due to indwelling urethral catheter, initial encounter: Secondary | ICD-10-CM | POA: Diagnosis not present

## 2022-09-03 DIAGNOSIS — Z7401 Bed confinement status: Secondary | ICD-10-CM | POA: Diagnosis not present

## 2022-09-03 DIAGNOSIS — Z743 Need for continuous supervision: Secondary | ICD-10-CM | POA: Diagnosis not present

## 2022-09-03 DIAGNOSIS — R531 Weakness: Secondary | ICD-10-CM | POA: Diagnosis not present

## 2022-09-03 LAB — URINE CULTURE: Culture: <10000 — AB

## 2022-09-03 LAB — COMPREHENSIVE METABOLIC PANEL
ALT: 17 U/L (ref 0–44)
AST: 23 U/L (ref 15–41)
Albumin: 2.8 g/dL — ABNORMAL LOW (ref 3.5–5.0)
Alkaline Phosphatase: 56 U/L (ref 38–126)
Anion gap: 5 (ref 5–15)
BUN: <5 mg/dL — ABNORMAL LOW (ref 8–23)
CO2: 24 mmol/L (ref 22–32)
Calcium: 8.4 mg/dL — ABNORMAL LOW (ref 8.9–10.3)
Chloride: 107 mmol/L (ref 98–111)
Creatinine, Ser: 0.59 mg/dL (ref 0.44–1.00)
GFR, Estimated: >60 mL/min (ref >60)
Glucose, Bld: 110 mg/dL — ABNORMAL HIGH (ref 70–99)
Potassium: 3.4 mmol/L — ABNORMAL LOW (ref 3.5–5.1)
Sodium: 136 mmol/L (ref 135–145)
Total Bilirubin: 0.3 mg/dL (ref 0.3–1.2)
Total Protein: 6.4 g/dL — ABNORMAL LOW (ref 6.5–8.1)

## 2022-09-03 LAB — CBC
HCT: 31.3 % — ABNORMAL LOW (ref 36.0–46.0)
Hemoglobin: 9.7 g/dL — ABNORMAL LOW (ref 12.0–15.0)
MCH: 28.4 pg (ref 26.0–34.0)
MCHC: 31 g/dL (ref 30.0–36.0)
MCV: 91.8 fL (ref 80.0–100.0)
Platelets: 238 10*3/uL (ref 150–400)
RBC: 3.41 MIL/uL — ABNORMAL LOW (ref 3.87–5.11)
RDW: 14.1 % (ref 11.5–15.5)
WBC: 6.6 10*3/uL (ref 4.0–10.5)
nRBC: 0 % (ref 0.0–0.2)

## 2022-09-03 LAB — GLUCOSE, CAPILLARY
Glucose-Capillary: 100 mg/dL — ABNORMAL HIGH (ref 70–99)
Glucose-Capillary: 111 mg/dL — ABNORMAL HIGH (ref 70–99)
Glucose-Capillary: 133 mg/dL — ABNORMAL HIGH (ref 70–99)
Glucose-Capillary: 212 mg/dL — ABNORMAL HIGH (ref 70–99)

## 2022-09-03 LAB — D-DIMER, QUANTITATIVE: D-Dimer, Quant: 1.11 ug/mL-FEU — ABNORMAL HIGH (ref 0.00–0.50)

## 2022-09-03 LAB — C-REACTIVE PROTEIN: CRP: 3.2 mg/dL — ABNORMAL HIGH (ref <1.0)

## 2022-09-03 MED ORDER — APIDRA SOLOSTAR 100 UNIT/ML ~~LOC~~ SOPN: 15.0000 [IU] | PEN_INJECTOR | Freq: Three times a day (TID) | SUBCUTANEOUS | 11 refills | Status: AC | PRN

## 2022-09-03 MED ORDER — GABAPENTIN 600 MG PO TABS: 600.0000 mg | ORAL_TABLET | Freq: Three times a day (TID) | ORAL | 0 refills | Status: AC

## 2022-09-03 MED ORDER — NYSTATIN 100000 UNIT/GM EX POWD: Freq: Two times a day (BID) | CUTANEOUS | 0 refills | Status: AC

## 2022-09-03 MED ORDER — CARVEDILOL 3.125 MG PO TABS: 3.1250 mg | ORAL_TABLET | Freq: Two times a day (BID) | ORAL | 1 refills | Status: AC

## 2022-09-03 NOTE — Progress Notes (Signed)
BLE venous duplex has been completed.   Results can be found under chart review under CV PROC. 09/03/2022 4:55 PM Jorene Kaylor RVT, RDMS

## 2022-09-03 NOTE — TOC Initial Note (Signed)
Transition of Care [facility]) - Initial/Assessment Note    Patient Details  Name: Natalie Macdonald MRN: [id number] Date of Birth: [birth year]  Transition of Care [facility]) CM/SW Contact:    Henrietta Dine, RN Phone Number: 09/03/2022, 5:35 PM  Clinical Narrative:                 [facility] consult for ambulance transport home; spoke w/ pt's POC/dtr Mykia [phone]); she says pt is from home and plans to return at d/c; she says pt uses ambulance service for transport, and she has a HHMD; her dtr denies pt experiencing IPV, food insecurity, or difficulty paying utilities; she says pt has glasses and lower partials; she does not have home oxygen, HA or other [place] services; she says pt has wheelchair, hoyer, BSC, [place], and hospital bed; she agrees to transport by ambulance and confirms d/c address [address] Letta Kocher [phone]; spoke w/ operator # Fuller Plan, Lattie Haw at [phone]; no TOC needs.        Patient Goals and CMS Choice            Expected Discharge Plan and Services         Expected Discharge Date: 09/03/22                                    Prior Living Arrangements/Services                       Activities of Daily Living Home Assistive Devices/Equipment: Hospital bed, Ostomy supplies, Other (Comment), [employer] fast streamer (lift chair) ADL Screening (condition at time of admission) Patient's cognitive ability adequate to safely complete daily activities?: Yes Is the patient deaf or have difficulty hearing?: No Does the patient have difficulty seeing, even when wearing glasses/contacts?: No Does the patient have difficulty concentrating, remembering, or making decisions?: No Patient able to express need for assistance with ADLs?: Yes Does the patient have difficulty dressing or bathing?: Yes Independently performs ADLs?: No Communication: Independent Dressing (OT): Dependent Is this a change from baseline?: Pre-admission baseline Grooming: Dependent Is this a change from  baseline?: Pre-admission baseline Feeding: Needs assistance Is this a change from baseline?: Pre-admission baseline Bathing: Dependent Is this a change from baseline?: Pre-admission baseline Toileting: Dependent Is this a change from baseline?: Pre-admission baseline In/Out Bed: Dependent Is this a change from baseline?: Pre-admission baseline Walks in Home: Dependent Is this a change from baseline?: Pre-admission baseline Does the patient have difficulty walking or climbing stairs?: Yes Weakness of Legs: Both Weakness of Arms/Hands: Both  Permission Sought/Granted                  Emotional Assessment              Admission diagnosis:  Disorientation [R41.0] Urinary tract infection without hematuria, site unspecified [N39.0] Urinary tract infection associated with indwelling urethral catheter, initial encounter ([place]) [id number], N39.0] Hypotension due to hypovolemia [I95.89, E86.1] UTI (urinary tract infection) [N39.0] Patient Active Problem List   Diagnosis Date Noted   UTI (urinary tract infection) 09/02/2022   Stage IV pressure ulcer of sacral region ([place]) 08/31/2022   Hypokalemia 08/31/2022   Hypotension 08/31/2022   Urinary tract infection associated with indwelling urethral catheter, initial encounter ([place]) 08/31/2022   Positive blood culture 09/09/2019   Yeast cystitis 09/09/2019   Hypothyroidism 02/27/2018   Diarrhea 01/06/2018   Sepsis ([place]) 01/03/2018   Cellulitis 01/03/2018  Sacral decubitus ulcer, stage III ([place]) 01/03/2018   AKI (acute kidney injury) ([place]) 01/03/2018   Dehydration 01/03/2018   Toxic encephalopathy 01/03/2018   Adverse drug effect 01/03/2018   Acute encephalopathy    Hypoxemia    Palliative care encounter    Septic shock ([place])    Acute respiratory failure with hypoxemia ([facility])    Pressure injury of skin 10/18/2016   Cervical spondylosis with myelopathy 10/17/2016   Chronic pain 09/05/2016   Cervicalgia 09/05/2016   Diabetes  mellitus type 2 with complications ([place]) [id number]   Hyperlipidemia associated with type 2 diabetes mellitus ([place]) 09/05/2016   Hypertension 09/05/2016   Sarcoidosis 09/05/2016   Cervical disc disorder with myelopathy of mid-cervical region 03/13/2016   Left hemiplegia ([place]) 02/07/2016   Chronic osteomyelitis of ankle and foot, left ([place]) 09/25/2014   Hyponatremia 09/25/2014   Diabetic ulcer of left foot associated with type 2 diabetes mellitus ([place]) 08/30/2014   Charcot's joint of foot due to diabetes ([place]) 10/15/2013   Pain in joint involving ankle and foot 10/15/2013   PCP:  Pcp, No Pharmacy:   [id number] [id number]: [phone]: [phone]    Social Determinants of Health (SDOH) Social History: SDOH Screenings   Food Insecurity: No Food Insecurity (09/03/2022)  Housing: Low Risk  (09/03/2022)  Transportation Needs: No Transportation Needs (09/01/2022)  Utilities: Not At Risk (09/03/2022)  Tobacco Use: Low Risk  (08/31/2022)   SDOH Interventions: Food Insecurity Interventions: Inpatient TOC Housing Interventions: Inpatient TOC Utilities Interventions: Inpatient TOC   Readmission Risk Interventions     No data to display

## 2022-09-03 NOTE — Discharge Summary (Addendum)
Physician Discharge Summary  Rhelda Heitzmann [id number] DOB: 07-14-1955 DOA: 08/31/2022  PCP: Merryl Hacker, No  Admit date: 08/31/2022 Discharge date: 09/03/2022  Time spent: 33 minutes  Recommendations for Outpatient Follow-up:  De-escalate opiates and other meds in the outpatient setting as polypharmacy probably rate played a role Require split-night sleep study as she has a habitus of OHSS and combination of this and chronic pain meds is not good Please conduct risk-benefit discussion about suppressive antimicrobial therapy for recurrent UTIs Requires CBC Chem-12 1 week No further Lotrel in the outpatient setting would not use this--instead use plain all amlodipine depending on labs in the outpatient setting Continue outpatient wound care for large sacral decubitus ulcers which were present prior to [facility] care in the outpatient setting with Aquacel and Silvadene  Discharge Diagnoses:  MAIN problem for hospitalization   Toxic metabolic encephalopathy secondary to probable CO2 narcosis + polypharmacy No acute urinary tract infection on discharge Hypoglycemia during hospitalization Coincidental COVID-positive without symptoms   Please see below for itemized issues addressed in HOpsital- refer to other progress notes for clarity if needed  Discharge Condition: Fair  Diet recommendation: Diabetic  Filed Weights   08/31/22 1745 09/01/22 0100  Weight: 117.7 kg 117.5 kg    Hospital Course:  67 home dwell fem--primarily WC bound, Chr Indwelling foley--lives with daughter (ex military veteran retired and takes care of her completely for all ADLs IADLs) 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 + Charcot joint Hypothyroid, HLD, hypotension in the setting of her quadriplegia, (prior hypertension)   Last admit [id number] toxic metabolic encephalopathy 2/2 presumed pyelonephritis  symptomatic hyponatremia-completed antibiotic course discharged home and was placed on suppressive Keflex after this visit by PCP   ED visit 06/28/2022 reviewed multiple colony-forming units Corynebacterium Rx cefpodoxime/Bactrim   Brought by daughter 2/29 to emergency room 2/2 dysuria dark urine encephalopathy (recently doubled Keflex 250-->500 daily--PCP had prescribed apparently 1 week prior to admission)--- she became apparently combative on 2/29 EMS noted she was lethargic (also?  Change in dose of amitriptyline?)   BP 106/54 pulse rate 70s Tmax 99.0 UA (catheter had not been changed in a month) negative nitrites moderate leukocytes >50 RBC WBC many bacteria 2 VW CXR = negative   ED Rx saline 2 L ceftriaxone 2 g   COVID found +, asymptomatic   HOSPITAL COURSE   COVID-positive, coincidental--thinks she got this from a friend who visited her home about a week ago - No oxygen requirement not symptomatic at all - dimer slight elevated-US duplex neg, no need symptomatic rx, can d/c home   Toxic metabolic encephalopathy on admission --secondary to polypharmacy NOT UTI - Indwelling Foley changed on 3/1 UA = rare bacteria, urine culture from 3/1 insignificant growth - Completed several days of ceftriaxone and saline locked prior to discharge Resuming suppressive Keflex prior to admission dosing-needs risk-benefit discussion with outpatient PCP regarding risk of MDR resistant bug versus prevention of chronic UTIs - Currently on amitriptyline 100 at bedtime - same dose as prior dosing back in October 2022- mentation intact - Continuing MSIR 15 mg every 8, Oxy IR 20 x 5 times daily, Atarax 10 at bedtime, gabapentin 800 3 times daily  - [facility] X2--2/29 NGTD   Chronic pain prior sacral decubitus Charcot joints of both lower extremities Prior ACDF 2018 with residual vocal cord paralysis and chronic left hemiparesis - PT OT we do not recommend any escalation  of care-she is at usual pre-morbid  baseline - Patient's diverting ostomy is functioning well    High risk of polypharmacy - I pulled the daughter aside prior to discharge and told her that patient definitely needs to follow-up with pain physician-that long-term strategy for opiates is not a good thing and we have already started de-escalating her meds - I cut back her gabapentin to 600 3 times daily-she probably can come off her Atarax soon - She will need durable nonpharmacological management of her chronic pain  DM TY 2 with complication of hypoglycemia this morning 3/2-[daughter was recommending higher doses of apidra] - Home meds reviewed and because of this we adjusted downwards her sliding scale Apidra - I have mentioned to the daughter to escalate meds based on her appetite which will surely improve in the outpatient setting as she is noncompliant with diabetic recommendations (she was having a donut at the time of discharge)   Hypokalemia on admission - Resolved with K-Dur 40   Hypotension in setting of hypertension with probable dysautonomia -  Lotrel held from admission and need to be resumed when she follows up in the outpatient setting - Coreg resumed at much lower dose of 3.125 twice daily and can be titrated back up in the outpatient setting   Chronic sacral decubitus - Wound care has seen-wound is not appear infected continue silver sulfadiazine, Aquacel dressings and turn as frequently as able   Intertriginous MASD - Start nystatin powder to inner thighs, placed towel to prevent moisture accumulation   BMI 41 - Does have habitus for OHSS and may require outpatient split-night study   Continuing Synthroid at PTA dose      Discharge Exam: Vitals:   09/03/22 0631 09/03/22 1254  BP: 132/61 (!) 155/89  Pulse: 83 78  Resp: 16 19  Temp: 99 F (37.2 C) 98.7 F (37.1 C)  SpO2: 98% 96%    Subj on day of d/c   Awake coherent not confused Pleasant Eating drinking  General Exam on  discharge  EOMI NCAT no focal deficit thick neck Mallampati 4 Chest clear no added sound I reviewed the wounds with nursing staff prior to discharge they are a little bit woozy but this may be the Aquacel that is on board-I do not appreciate any tunneling or large skin deficit and daughter says that they are "much improved" as patient at 1 point had tunneling to the spine Bilateral lower extremity edema present she has deficits to power and motion in the left upper extremity and a little bit of clawing of her left hand Psych is flat  Discharge Instructions  Discharge Instructions   None    Discharge Instructions   None      Discharge Instructions     Diet - low sodium heart healthy   Complete by: As directed    Diet - low sodium heart healthy   Complete by: As directed    Discharge instructions   Complete by: As directed    Several changes have been made to the meds  The main 1 is to cut back some of the blood pressure meds as your mom's blood pressure was low on admission probably secondary to too many meds on board the amlodipine benazepril and needs to be stopped completely and this can be really assessed as an outpatient  We have cut back the dose of the Coreg significantly and this can be increased to once your mom follows up with her outpatient physician  Please follow the recommendation for insulin that I have given only as a guideline up-obviously if she is eating 3+ meals and sweets she will need higher amounts of insulin please watch for hypoglycemia and keep a log of her sugars  It would be a good idea to de-escalate as we discussed off of all of her chronic pain meds I would not recommend using MS IR or oxycodone long-term-these are not good meds-in addition please be mindful of the dosing of amitriptyline and other medications-I do think that your pain physician should discontinue the majority of these meds if at all possible  Please follow-up with wound care in the  outpatient setting because they need to reassess whether they need to change the strategy of the Aquacel and the Silvadene  Please change the Foley catheter before the 30-day mark in the outpatient setting to prevent UTIs-while it is a good practice to continue Keflex for suppressive therapy, this has to be balanced with the risk of multidrug-resistant organisms that could form in the urinary tract-I would recommend follow-up with either her primary care physician or her urologist to make this determination in the outpatient setting  It would be a good idea overall to discuss goals of care with your mom frankly in terms of if something were to happen to her or if she was to get really sick what we would want to do in that eventuality as I noticed that she would want to be a full code and would want aggressive resuscitation-this can be undertaken by her primary physician   Please also consider getting a split-night sleep study as she has a thick neck and may have obstructive sleep apnea   Discharge wound care:   Complete by: As directed    Comments: Apply Aquacel to left buttock/sacrum wound Q day, then cover with ABD pads and tape  09/01/22 1049   Discharge wound care:   Complete by: As directed    Per pior   Increase activity slowly   Complete by: As directed    Increase activity slowly   Complete by: As directed       Allergies as of 09/03/2022       Reactions   Grapefruit Bioflavonoid Complex Anaphylaxis, Hives, Swelling, Other (See Comments)   Caused her to get hives, swelling of an eye, throat    Insulin Lispro Rash, Other (See Comments)   Caused patient to pass out b/c was not regulating insulin correctly & also caused pt to develop a severe reaction site rash. **OF NOTE:Patient CAN use Lantus &  Humulin-R.**   Latex Hives   Procaine Anaphylaxis   Tape Rash, Other (See Comments)   PAPER TAPE ONLY!!   Novolog [insulin Aspart] Other (See Comments)   Rapid heart rate   Penicillins  Hives, Itching, Swelling, Other (See Comments)   Tolerates Keflex, Augmentin (2021)        Medication List     STOP taking these medications    amLODipine-benazepril 5-20 MG capsule Commonly known as: LOTREL       TAKE these medications    amitriptyline 100 MG tablet Commonly known as: ELAVIL Take 100 mg by mouth at bedtime.   Apidra SoloStar 100 UNIT/ML Solostar Pen Generic drug: insulin glulisine Inject 15-25 Units into the skin 3 (three) times daily as needed (for elevated BGL). What changed: how much to take   carvedilol 3.125 MG tablet Commonly known as: COREG Take 1 tablet (3.125 mg total) by mouth 2 (two) times  daily with a meal. What changed:  medication strength how much to take when to take this   Cephalexin 500 MG tablet Take 500 mg by mouth in the morning.   cholecalciferol 25 MCG (1000 UNIT) tablet Commonly known as: VITAMIN D3 Take 1,000 Units by mouth daily.   [employer] B12 Gummies 500 MCG Chew Generic drug: Cyanocobalamin Chew 1,000 mg by mouth daily.   diclofenac Sodium 1 % Gel Commonly known as: VOLTAREN Apply 2 g topically daily as needed (for pain (affected sites)).   FIBER PO Take 2 capsules by mouth daily.   fluticasone 50 MCG/ACT nasal spray Commonly known as: FLONASE Place 2 sprays into both nostrils daily as needed for allergies.   gabapentin 600 MG tablet Commonly known as: NEURONTIN Take 1 tablet (600 mg total) by mouth 3 (three) times daily. What changed:  medication strength how much to take   Geritol Complete Tabs Take 1 tablet by mouth daily with breakfast.   hydrOXYzine 10 MG tablet Commonly known as: ATARAX Take 10 mg by mouth at bedtime.   Klor-Con M20 20 MEQ tablet Generic drug: potassium chloride SA Take 20 mEq by mouth daily.   montelukast 10 MG tablet Commonly known as: SINGULAIR Take 10 mg by mouth at bedtime.   morphine 15 MG 12 hr tablet Commonly known as: MS CONTIN Take 15 mg by mouth every 8 (eight)  hours.   NON FORMULARY Take 1-2 tablets by mouth See admin instructions. Vitamin B-6 gummies: Chew 1-2 gummies by mouth once a day   nystatin powder Commonly known as: MYCOSTATIN/NYSTOP Apply topically 2 (two) times daily.   Oxycodone HCl 20 MG Tabs Take 20 mg by mouth 5 (five) times daily.   pantoprazole 40 MG tablet Commonly known as: PROTONIX Take 40 mg by mouth daily.   pravastatin 20 MG tablet Commonly known as: PRAVACHOL Take 20 mg by mouth daily.   PROBIOTIC PO Take 1 capsule by mouth daily.   silver sulfADIAZINE 1 % cream Commonly known as: SILVADENE Apply 1 application  topically See admin instructions. Use as directed with sacral wound care   Synthroid 112 MCG tablet Generic drug: levothyroxine Take 168 mcg by mouth daily before breakfast.   Toujeo SoloStar 300 UNIT/ML Solostar Pen Generic drug: insulin glargine (1 Unit Dial) Inject 60 Units into the skin at bedtime.               Discharge Care Instructions  (From admission, onward)           Start     Ordered   09/03/22 0000  Discharge wound care:       Comments: Comments: Apply Aquacel to left buttock/sacrum wound Q day, then cover with ABD pads and tape  09/01/22 1049   09/03/22 1703           Allergies  Allergen Reactions   Grapefruit Bioflavonoid Complex Anaphylaxis, Hives, Swelling and Other (See Comments)    Caused her to get hives, swelling of an eye, throat    Insulin Lispro Rash and Other (See Comments)    Caused patient to pass out b/c was not regulating insulin correctly & also caused pt to develop a severe reaction site rash. **OF NOTE:Patient CAN use Lantus &  Humulin-R.**   Latex Hives   Procaine Anaphylaxis   Tape Rash and Other (See Comments)    PAPER TAPE ONLY!!   Novolog [Insulin Aspart] Other (See Comments)    Rapid heart rate    Penicillins Hives, Itching,  Swelling and Other (See Comments)    Tolerates Keflex, Augmentin (2021)      The results of  significant diagnostics from this hospitalization (including imaging, microbiology, ancillary and laboratory) are listed below for reference.    Significant Diagnostic Studies: VAS [place] LOWER EXTREMITY VENOUS (DVT)  Result Date: 09/03/2022  Lower Venous DVT Study Patient Name:  BETTYJANE KULHANEK  Date of Exam:   09/03/2022 Medical Rec #: [id number]      Accession #:    [id number] Date of Birth: [birth year]      Patient Gender: F Patient Age:   67 years Exam Location:  [facility] Procedure:      VAS [place] LOWER EXTREMITY VENOUS (DVT) Referring Phys: Nita Sells --------------------------------------------------------------------------------  Indications: Edema.  Risk Factors: Immobility & COVID+. Limitations: VERY LIMITED EXAM due to body habitus, poor ultrasound/tissue interface, and diffuse bilateral subcutaneous edema. Comparison Study: Previous exam (RLEV) at [facility] was negative for DVT Performing Technologist: Rogelia Rohrer RVT, RDMS  Examination Guidelines: A complete evaluation includes B-mode imaging, spectral Doppler, color Doppler, and power Doppler as needed of all accessible portions of each vessel. Bilateral testing is considered an integral part of a complete examination. Limited examinations for reoccurring indications may be performed as noted. The reflux portion of the exam is performed with the patient in reverse Trendelenburg.  +--------+---------------+---------+-----------+----------+--------------------+ RIGHT   CompressibilityPhasicitySpontaneityPropertiesThrombus Aging       +--------+---------------+---------+-----------+----------+--------------------+ CFV     Full           Yes      Yes                                       +--------+---------------+---------+-----------+----------+--------------------+ SFJ     Full                                                              +--------+---------------+---------+-----------+----------+--------------------+ FV Prox  Full           Yes      Yes                  Not well visualized  +--------+---------------+---------+-----------+----------+--------------------+ FV Mid                 Yes      Yes                  patent by                                                                 doppler/color        +--------+---------------+---------+-----------+----------+--------------------+ FV                                                   Not visualized       Distal                                                                    +--------+---------------+---------+-----------+----------+--------------------+  PFV     Full           Yes      Yes                                       +--------+---------------+---------+-----------+----------+--------------------+ POP                    Yes      Yes                  patent by                                                                 doppler/color        +--------+---------------+---------+-----------+----------+--------------------+ PTV                                                  Not well visualized  +--------+---------------+---------+-----------+----------+--------------------+ PERO                                                 Not well visualized  +--------+---------------+---------+-----------+----------+--------------------+   Right Technical Findings: Unable to visualized distal femoral vein due to catheter placement.  +--------+---------------+---------+-----------+----------+--------------------+ LEFT    CompressibilityPhasicitySpontaneityPropertiesThrombus Aging       +--------+---------------+---------+-----------+----------+--------------------+ CFV     Full           Yes      Yes                                       +--------+---------------+---------+-----------+----------+--------------------+ SFJ     Full                                                               +--------+---------------+---------+-----------+----------+--------------------+ FV Prox Full           Yes      Yes                  Not well visualized  +--------+---------------+---------+-----------+----------+--------------------+ FV Mid                                               Not visualized       +--------+---------------+---------+-----------+----------+--------------------+ FV  Not visualized       Distal                                                                    +--------+---------------+---------+-----------+----------+--------------------+ PFV     Full           Yes      Yes                                       +--------+---------------+---------+-----------+----------+--------------------+ POP                    Yes      Yes                  patent by                                                                 doppler/color        +--------+---------------+---------+-----------+----------+--------------------+ PTV                                                  Not visualized       +--------+---------------+---------+-----------+----------+--------------------+ PERO                                                 Not well visualized  +--------+---------------+---------+-----------+----------+--------------------+   Left Technical Findings: Not visualized segments include mid/distal femoral vein, and posterior tibial veins.   Summary: BILATERAL: -No evidence of popliteal cyst, bilaterally. RIGHT: - There is no evidence of deep vein thrombosis in the lower extremity. However, portions of this examination were limited- see technologist comments above.  - Diffuse subcutaneous edema throughout extremity. - Ultrasound characteristics of enlarged lymph nodes are noted in the groin.  LEFT: - There is no evidence of deep vein thrombosis in the lower extremity. However, portions of  this examination were limited- see technologist comments above.  - Diffuse subcutaneous edema throughout extremity. - Ultrasound characteristics of enlarged lymph nodes noted in the groin.  *See table(s) above for measurements and observations.    Preliminary    DG Chest 2 View  Result Date: 08/31/2022 CLINICAL DATA:  Suspected Sepsis EXAM: CHEST - 2 VIEW COMPARISON:  Chest x-ray 06/28/2022. FINDINGS: Similar cardiomegaly. No consolidation. No visible pleural effusions or pneumothorax. No acute osseous abnormality. Partially imaged ACDF. IMPRESSION: Similar cardiomegaly.  No consolidation. Electronically Signed   By: Margaretha Sheffield M.D.   On: 08/31/2022 16:27    Microbiology: Recent Results (from the past 240 hour(s))  Culture, blood (Routine x 2)     Status: None (Preliminary result)   Collection Time: 08/31/22  2:27 PM   Specimen: BLOOD RIGHT  FOREARM  Result Value Ref Range Status   Specimen Description   Final    BLOOD RIGHT FOREARM Performed at [facility], [place] [address] [place], [place] [address]    Special Requests   Final    BOTTLES DRAWN AEROBIC AND ANAEROBIC Blood Culture adequate volume Performed at [place] [address], [place], [place] [address]    Culture   Final    NO GROWTH 3 DAYS Performed at [facility], [place] [address], [place], [place] [address]    Report Status PENDING  Incomplete  Culture, blood (Routine x 2)     Status: None (Preliminary result)   Collection Time: 08/31/22  4:20 PM   Specimen: BLOOD RIGHT HAND  Result Value Ref Range Status   Specimen Description   Final    BLOOD RIGHT HAND Performed at [facility], [place] [address], [place], [place] [address]    Special Requests   Final    BOTTLES DRAWN AEROBIC AND ANAEROBIC Blood Culture adequate volume Performed at [place] [address] [place], [place] [address]    Culture   Final    NO GROWTH 3 DAYS Performed at [facility], [place] [address] [place], [place] [address]    Report Status PENDING  Incomplete  Urine Culture (for pregnant, neutropenic or urologic patients or patients with an indwelling urinary catheter)     Status: Abnormal   Collection Time: 08/31/22  9:32 PM   Specimen: Urine, Catheterized  Result Value Ref Range Status   Specimen Description   Final    URINE, CATHETERIZED Performed at [facility] [address], [place], [place] [address]    Special Requests   Final    NONE Performed at [facility], [place] [address], [place], [place] [address]    Culture MULTIPLE SPECIES PRESENT, SUGGEST RECOLLECTION (A)  Final   Report Status 09/02/2022 FINAL  Final  Urine Culture     Status: Abnormal   Collection Time: 09/01/22  9:46 AM   Specimen: Urine, Catheterized  Result Value Ref Range Status   Specimen Description   Final    URINE, CATHETERIZED Performed at [place] [address], [place], [place] [address]    Special Requests   Final    NONE Performed at [facility], [place] [address] [place], [place] [address]    Culture (A)  Final    <10,000 COLONIES/mL INSIGNIFICANT GROWTH Performed at [place] [address] [place], [place] [address]    Report Status 09/03/2022 FINAL  Final  SARS Coronavirus 2 by RT PCR (hospital order, performed in [facility] hospital lab) *cepheid single result test* Anterior Nasal Swab     Status: Abnormal   Collection Time: 09/01/22  1:46 PM   Specimen: Anterior Nasal Swab  Result Value Ref Range Status   SARS Coronavirus 2 by RT PCR POSITIVE (A) NEGATIVE Final    Comment: (NOTE) SARS-CoV-2 target nucleic acids are DETECTED  SARS-CoV-2 RNA is generally detectable in upper respiratory specimens  during the acute phase of infection.  Positive results are indicative  of the presence of the identified virus, but do not rule out bacterial infection or co-infection  with other pathogens not detected by the test.  Clinical correlation with patient history and  other diagnostic information is necessary to determine patient infection status.  The expected result is negative.  Fact  Sheet for Patients:   https://www.patel.info/   Fact Sheet for Healthcare Providers:   https://hall.com/    This test is not yet approved or cleared by the [place] FDA and  has been authorized for detection and/or diagnosis of SARS-CoV-2 by FDA under an Emergency Use Authorization (EUA).  This EUA will remain in effect (meaning this test can be used) for the duration of  the COVID-19 declaration under Section 564(b)(1)  of the Act, 21 U.S.C. section 360-bbb-3(b)(1), unless the authorization is terminated or revoked sooner.   Performed at [facility], [place] [address], [place], [place] [address]      Labs: Basic Metabolic Panel: Recent Labs  Lab 08/31/22 1615 09/01/22 0504 09/02/22 0413 09/03/22 0353  NA 131* 133* 137 136  K 3.2* 3.9 3.5 3.4*  CL 99 105 105 107  CO2 22 19* 24 24  GLUCOSE 149* 298* 72 110*  BUN 20 15 7* <5*  CREATININE 0.86 0.83 0.51 0.59  CALCIUM 8.0* 8.1* 8.7* 8.4*   Liver Function Tests: Recent Labs  Lab 08/31/22 1615 09/02/22 0413 09/03/22 0353  AST '31 18 23  '$ ALT '18 16 17  '$ ALKPHOS 66 51 56  BILITOT 0.6 0.2* 0.3  PROT 6.5 6.0* 6.4*  ALBUMIN 2.7* 2.7* 2.8*   No results for input(s): "LIPASE", "AMYLASE" in the last 168 hours. No results for input(s): "AMMONIA" in the last 168 hours. CBC: Recent Labs  Lab 08/31/22 1615 09/01/22 0504 09/02/22 0413 09/03/22 0353  WBC 7.4 7.8 7.7 6.6  NEUTROABS 3.8  --   --   --   HGB 10.3* 10.5* 9.9* 9.7*  HCT 33.2* 33.0* 31.7* 31.3*  MCV 92.2 90.9 92.4 91.8  PLT 195 178 214 238   Cardiac Enzymes: No results for input(s): "CKTOTAL", "CKMB", "CKMBINDEX", "TROPONINI" in the last 168 hours. BNP: BNP (last 3 results) No  results for input(s): "BNP" in the last 8760 hours.  ProBNP (last 3 results) No results for input(s): "PROBNP" in the last 8760 hours.  CBG: Recent Labs  Lab 09/02/22 1130 09/02/22 1622 09/02/22 2147 09/03/22 0802 09/03/22 1147  GLUCAP 114* 107* 160* 100* 111*       Signed:  Nita Sells MD   [facility] 09/03/2022, 5:12 PM

## 2022-09-04 LAB — HEMOGLOBIN A1C
Hgb A1c MFr Bld: 8.2 % — ABNORMAL HIGH (ref 4.8–5.6)
Mean Plasma Glucose: 189 mg/dL

## 2022-09-05 LAB — CULTURE, BLOOD (ROUTINE X 2)
Culture: NO GROWTH
Culture: NO GROWTH
Special Requests: ADEQUATE
Special Requests: ADEQUATE

## 2022-09-08 DIAGNOSIS — G8221 Paraplegia, complete: Secondary | ICD-10-CM | POA: Diagnosis not present

## 2022-09-08 DIAGNOSIS — M542 Cervicalgia: Secondary | ICD-10-CM | POA: Diagnosis not present

## 2022-09-08 DIAGNOSIS — G890 Central pain syndrome: Secondary | ICD-10-CM | POA: Diagnosis not present

## 2022-09-08 DIAGNOSIS — M25552 Pain in left hip: Secondary | ICD-10-CM | POA: Diagnosis not present

## 2022-09-08 DIAGNOSIS — M961 Postlaminectomy syndrome, not elsewhere classified: Secondary | ICD-10-CM | POA: Diagnosis not present

## 2022-09-11 DIAGNOSIS — Z433 Encounter for attention to colostomy: Secondary | ICD-10-CM | POA: Diagnosis not present

## 2022-09-11 DIAGNOSIS — L89154 Pressure ulcer of sacral region, stage 4: Secondary | ICD-10-CM | POA: Diagnosis not present

## 2022-09-11 DIAGNOSIS — Z936 Other artificial openings of urinary tract status: Secondary | ICD-10-CM | POA: Diagnosis not present

## 2022-09-26 DIAGNOSIS — Z760 Encounter for issue of repeat prescription: Secondary | ICD-10-CM | POA: Diagnosis not present

## 2022-09-26 DIAGNOSIS — K219 Gastro-esophageal reflux disease without esophagitis: Secondary | ICD-10-CM | POA: Diagnosis not present

## 2022-09-26 DIAGNOSIS — J329 Chronic sinusitis, unspecified: Secondary | ICD-10-CM | POA: Diagnosis not present

## 2022-09-26 DIAGNOSIS — Z960 Presence of urogenital implants: Secondary | ICD-10-CM | POA: Diagnosis not present

## 2022-09-26 DIAGNOSIS — I10 Essential (primary) hypertension: Secondary | ICD-10-CM | POA: Diagnosis not present

## 2022-09-26 DIAGNOSIS — E1165 Type 2 diabetes mellitus with hyperglycemia: Secondary | ICD-10-CM | POA: Diagnosis not present

## 2022-09-26 DIAGNOSIS — N390 Urinary tract infection, site not specified: Secondary | ICD-10-CM | POA: Diagnosis not present

## 2022-09-27 DIAGNOSIS — E1165 Type 2 diabetes mellitus with hyperglycemia: Secondary | ICD-10-CM | POA: Diagnosis not present

## 2022-10-06 DIAGNOSIS — M961 Postlaminectomy syndrome, not elsewhere classified: Secondary | ICD-10-CM | POA: Diagnosis not present

## 2022-10-06 DIAGNOSIS — M25552 Pain in left hip: Secondary | ICD-10-CM | POA: Diagnosis not present

## 2022-10-06 DIAGNOSIS — G890 Central pain syndrome: Secondary | ICD-10-CM | POA: Diagnosis not present

## 2022-10-06 DIAGNOSIS — M542 Cervicalgia: Secondary | ICD-10-CM | POA: Diagnosis not present

## 2022-10-06 DIAGNOSIS — G8221 Paraplegia, complete: Secondary | ICD-10-CM | POA: Diagnosis not present

## 2022-10-12 DIAGNOSIS — L89154 Pressure ulcer of sacral region, stage 4: Secondary | ICD-10-CM | POA: Diagnosis not present

## 2022-10-13 DIAGNOSIS — Z433 Encounter for attention to colostomy: Secondary | ICD-10-CM | POA: Diagnosis not present

## 2022-10-13 DIAGNOSIS — Z936 Other artificial openings of urinary tract status: Secondary | ICD-10-CM | POA: Diagnosis not present

## 2022-10-24 DIAGNOSIS — N390 Urinary tract infection, site not specified: Secondary | ICD-10-CM | POA: Diagnosis not present

## 2022-10-24 DIAGNOSIS — E039 Hypothyroidism, unspecified: Secondary | ICD-10-CM | POA: Diagnosis not present

## 2022-10-24 DIAGNOSIS — R600 Localized edema: Secondary | ICD-10-CM | POA: Diagnosis not present

## 2022-10-24 DIAGNOSIS — I10 Essential (primary) hypertension: Secondary | ICD-10-CM | POA: Diagnosis not present

## 2022-10-24 DIAGNOSIS — J069 Acute upper respiratory infection, unspecified: Secondary | ICD-10-CM | POA: Diagnosis not present

## 2022-10-24 DIAGNOSIS — E1165 Type 2 diabetes mellitus with hyperglycemia: Secondary | ICD-10-CM | POA: Diagnosis not present

## 2022-10-27 DIAGNOSIS — E1165 Type 2 diabetes mellitus with hyperglycemia: Secondary | ICD-10-CM | POA: Diagnosis not present

## 2022-10-30 DIAGNOSIS — G8221 Paraplegia, complete: Secondary | ICD-10-CM | POA: Diagnosis not present

## 2022-10-30 DIAGNOSIS — G890 Central pain syndrome: Secondary | ICD-10-CM | POA: Diagnosis not present

## 2022-10-30 DIAGNOSIS — M961 Postlaminectomy syndrome, not elsewhere classified: Secondary | ICD-10-CM | POA: Diagnosis not present

## 2022-10-30 DIAGNOSIS — M25552 Pain in left hip: Secondary | ICD-10-CM | POA: Diagnosis not present

## 2022-10-30 DIAGNOSIS — M542 Cervicalgia: Secondary | ICD-10-CM | POA: Diagnosis not present

## 2022-11-26 DIAGNOSIS — E1165 Type 2 diabetes mellitus with hyperglycemia: Secondary | ICD-10-CM | POA: Diagnosis not present

## 2022-11-28 DIAGNOSIS — M25552 Pain in left hip: Secondary | ICD-10-CM | POA: Diagnosis not present

## 2022-11-28 DIAGNOSIS — G890 Central pain syndrome: Secondary | ICD-10-CM | POA: Diagnosis not present

## 2022-11-28 DIAGNOSIS — M542 Cervicalgia: Secondary | ICD-10-CM | POA: Diagnosis not present

## 2022-11-28 DIAGNOSIS — M961 Postlaminectomy syndrome, not elsewhere classified: Secondary | ICD-10-CM | POA: Diagnosis not present

## 2022-11-28 DIAGNOSIS — G8221 Paraplegia, complete: Secondary | ICD-10-CM | POA: Diagnosis not present

## 2022-12-01 DIAGNOSIS — L89154 Pressure ulcer of sacral region, stage 4: Secondary | ICD-10-CM | POA: Diagnosis not present

## 2022-12-03 DIAGNOSIS — Z79899 Other long term (current) drug therapy: Secondary | ICD-10-CM | POA: Diagnosis not present

## 2022-12-03 DIAGNOSIS — G894 Chronic pain syndrome: Secondary | ICD-10-CM | POA: Diagnosis not present

## 2022-12-03 DIAGNOSIS — Z79891 Long term (current) use of opiate analgesic: Secondary | ICD-10-CM | POA: Diagnosis not present

## 2022-12-05 DIAGNOSIS — Z936 Other artificial openings of urinary tract status: Secondary | ICD-10-CM | POA: Diagnosis not present

## 2022-12-06 DIAGNOSIS — Z433 Encounter for attention to colostomy: Secondary | ICD-10-CM | POA: Diagnosis not present

## 2022-12-23 DIAGNOSIS — G8220 Paraplegia, unspecified: Secondary | ICD-10-CM | POA: Diagnosis not present

## 2022-12-23 DIAGNOSIS — I10 Essential (primary) hypertension: Secondary | ICD-10-CM | POA: Diagnosis not present

## 2022-12-23 DIAGNOSIS — G894 Chronic pain syndrome: Secondary | ICD-10-CM | POA: Diagnosis not present

## 2022-12-23 DIAGNOSIS — L8990 Pressure ulcer of unspecified site, unspecified stage: Secondary | ICD-10-CM | POA: Diagnosis not present

## 2022-12-23 DIAGNOSIS — B372 Candidiasis of skin and nail: Secondary | ICD-10-CM | POA: Diagnosis not present

## 2022-12-26 DIAGNOSIS — M25552 Pain in left hip: Secondary | ICD-10-CM | POA: Diagnosis not present

## 2022-12-26 DIAGNOSIS — E1165 Type 2 diabetes mellitus with hyperglycemia: Secondary | ICD-10-CM | POA: Diagnosis not present

## 2022-12-26 DIAGNOSIS — G8221 Paraplegia, complete: Secondary | ICD-10-CM | POA: Diagnosis not present

## 2022-12-26 DIAGNOSIS — M542 Cervicalgia: Secondary | ICD-10-CM | POA: Diagnosis not present

## 2022-12-26 DIAGNOSIS — L89154 Pressure ulcer of sacral region, stage 4: Secondary | ICD-10-CM | POA: Diagnosis not present

## 2022-12-26 DIAGNOSIS — G890 Central pain syndrome: Secondary | ICD-10-CM | POA: Diagnosis not present

## 2022-12-26 DIAGNOSIS — M961 Postlaminectomy syndrome, not elsewhere classified: Secondary | ICD-10-CM | POA: Diagnosis not present

## 2022-12-29 DIAGNOSIS — L89154 Pressure ulcer of sacral region, stage 4: Secondary | ICD-10-CM | POA: Diagnosis not present

## 2022-12-29 DIAGNOSIS — L89894 Pressure ulcer of other site, stage 4: Secondary | ICD-10-CM | POA: Diagnosis not present

## 2022-12-29 DIAGNOSIS — Z433 Encounter for attention to colostomy: Secondary | ICD-10-CM | POA: Diagnosis not present

## 2023-01-20 DIAGNOSIS — L8990 Pressure ulcer of unspecified site, unspecified stage: Secondary | ICD-10-CM | POA: Diagnosis not present

## 2023-01-20 DIAGNOSIS — E1165 Type 2 diabetes mellitus with hyperglycemia: Secondary | ICD-10-CM | POA: Diagnosis not present

## 2023-01-20 DIAGNOSIS — J42 Unspecified chronic bronchitis: Secondary | ICD-10-CM | POA: Diagnosis not present

## 2023-01-20 DIAGNOSIS — I10 Essential (primary) hypertension: Secondary | ICD-10-CM | POA: Diagnosis not present

## 2023-01-20 DIAGNOSIS — B372 Candidiasis of skin and nail: Secondary | ICD-10-CM | POA: Diagnosis not present

## 2023-01-20 DIAGNOSIS — G894 Chronic pain syndrome: Secondary | ICD-10-CM | POA: Diagnosis not present

## 2023-01-20 DIAGNOSIS — L89159 Pressure ulcer of sacral region, unspecified stage: Secondary | ICD-10-CM | POA: Diagnosis not present

## 2023-01-20 DIAGNOSIS — Z79891 Long term (current) use of opiate analgesic: Secondary | ICD-10-CM | POA: Diagnosis not present

## 2023-01-25 DIAGNOSIS — Z433 Encounter for attention to colostomy: Secondary | ICD-10-CM | POA: Diagnosis not present

## 2023-01-25 DIAGNOSIS — Z936 Other artificial openings of urinary tract status: Secondary | ICD-10-CM | POA: Diagnosis not present

## 2023-01-25 DIAGNOSIS — L89154 Pressure ulcer of sacral region, stage 4: Secondary | ICD-10-CM | POA: Diagnosis not present

## 2023-01-25 DIAGNOSIS — E1165 Type 2 diabetes mellitus with hyperglycemia: Secondary | ICD-10-CM | POA: Diagnosis not present

## 2023-01-29 DIAGNOSIS — G890 Central pain syndrome: Secondary | ICD-10-CM | POA: Diagnosis not present

## 2023-01-29 DIAGNOSIS — G8221 Paraplegia, complete: Secondary | ICD-10-CM | POA: Diagnosis not present

## 2023-01-29 DIAGNOSIS — M961 Postlaminectomy syndrome, not elsewhere classified: Secondary | ICD-10-CM | POA: Diagnosis not present

## 2023-01-29 DIAGNOSIS — M542 Cervicalgia: Secondary | ICD-10-CM | POA: Diagnosis not present

## 2023-01-29 DIAGNOSIS — M25552 Pain in left hip: Secondary | ICD-10-CM | POA: Diagnosis not present

## 2023-02-01 DIAGNOSIS — L89154 Pressure ulcer of sacral region, stage 4: Secondary | ICD-10-CM | POA: Diagnosis not present

## 2023-02-01 DIAGNOSIS — L8990 Pressure ulcer of unspecified site, unspecified stage: Secondary | ICD-10-CM | POA: Diagnosis not present

## 2023-02-24 DIAGNOSIS — Z79891 Long term (current) use of opiate analgesic: Secondary | ICD-10-CM | POA: Diagnosis not present

## 2023-02-24 DIAGNOSIS — L8990 Pressure ulcer of unspecified site, unspecified stage: Secondary | ICD-10-CM | POA: Diagnosis not present

## 2023-02-24 DIAGNOSIS — E1165 Type 2 diabetes mellitus with hyperglycemia: Secondary | ICD-10-CM | POA: Diagnosis not present

## 2023-02-24 DIAGNOSIS — Z7189 Other specified counseling: Secondary | ICD-10-CM | POA: Diagnosis not present

## 2023-02-24 DIAGNOSIS — G894 Chronic pain syndrome: Secondary | ICD-10-CM | POA: Diagnosis not present

## 2023-02-24 DIAGNOSIS — I10 Essential (primary) hypertension: Secondary | ICD-10-CM | POA: Diagnosis not present

## 2023-02-26 DIAGNOSIS — G890 Central pain syndrome: Secondary | ICD-10-CM | POA: Diagnosis not present

## 2023-02-26 DIAGNOSIS — M542 Cervicalgia: Secondary | ICD-10-CM | POA: Diagnosis not present

## 2023-02-26 DIAGNOSIS — M25552 Pain in left hip: Secondary | ICD-10-CM | POA: Diagnosis not present

## 2023-02-26 DIAGNOSIS — M961 Postlaminectomy syndrome, not elsewhere classified: Secondary | ICD-10-CM | POA: Diagnosis not present

## 2023-02-26 DIAGNOSIS — G8221 Paraplegia, complete: Secondary | ICD-10-CM | POA: Diagnosis not present

## 2023-03-08 DIAGNOSIS — Z433 Encounter for attention to colostomy: Secondary | ICD-10-CM | POA: Diagnosis not present

## 2023-03-08 DIAGNOSIS — Z936 Other artificial openings of urinary tract status: Secondary | ICD-10-CM | POA: Diagnosis not present

## 2023-03-11 DIAGNOSIS — L8990 Pressure ulcer of unspecified site, unspecified stage: Secondary | ICD-10-CM | POA: Diagnosis not present

## 2023-03-11 DIAGNOSIS — L89154 Pressure ulcer of sacral region, stage 4: Secondary | ICD-10-CM | POA: Diagnosis not present

## 2023-03-23 DIAGNOSIS — B372 Candidiasis of skin and nail: Secondary | ICD-10-CM | POA: Diagnosis not present

## 2023-03-23 DIAGNOSIS — E1165 Type 2 diabetes mellitus with hyperglycemia: Secondary | ICD-10-CM | POA: Diagnosis not present

## 2023-03-23 DIAGNOSIS — Z960 Presence of urogenital implants: Secondary | ICD-10-CM | POA: Diagnosis not present

## 2023-03-23 DIAGNOSIS — J019 Acute sinusitis, unspecified: Secondary | ICD-10-CM | POA: Diagnosis not present

## 2023-03-26 DIAGNOSIS — E1165 Type 2 diabetes mellitus with hyperglycemia: Secondary | ICD-10-CM | POA: Diagnosis not present

## 2023-04-25 DIAGNOSIS — E1165 Type 2 diabetes mellitus with hyperglycemia: Secondary | ICD-10-CM | POA: Diagnosis not present

## 2023-05-04 DIAGNOSIS — G8221 Paraplegia, complete: Secondary | ICD-10-CM | POA: Diagnosis not present

## 2023-05-04 DIAGNOSIS — M542 Cervicalgia: Secondary | ICD-10-CM | POA: Diagnosis not present

## 2023-05-04 DIAGNOSIS — M25552 Pain in left hip: Secondary | ICD-10-CM | POA: Diagnosis not present

## 2023-05-04 DIAGNOSIS — M961 Postlaminectomy syndrome, not elsewhere classified: Secondary | ICD-10-CM | POA: Diagnosis not present

## 2023-05-05 DIAGNOSIS — Z960 Presence of urogenital implants: Secondary | ICD-10-CM | POA: Diagnosis not present

## 2023-05-05 DIAGNOSIS — J329 Chronic sinusitis, unspecified: Secondary | ICD-10-CM | POA: Diagnosis not present

## 2023-05-05 DIAGNOSIS — E118 Type 2 diabetes mellitus with unspecified complications: Secondary | ICD-10-CM | POA: Diagnosis not present

## 2023-05-14 DIAGNOSIS — L89894 Pressure ulcer of other site, stage 4: Secondary | ICD-10-CM | POA: Diagnosis not present

## 2023-05-14 DIAGNOSIS — Z936 Other artificial openings of urinary tract status: Secondary | ICD-10-CM | POA: Diagnosis not present

## 2023-05-14 DIAGNOSIS — L89154 Pressure ulcer of sacral region, stage 4: Secondary | ICD-10-CM | POA: Diagnosis not present

## 2023-05-14 DIAGNOSIS — Z433 Encounter for attention to colostomy: Secondary | ICD-10-CM | POA: Diagnosis not present

## 2023-05-14 DIAGNOSIS — L8990 Pressure ulcer of unspecified site, unspecified stage: Secondary | ICD-10-CM | POA: Diagnosis not present

## 2023-05-25 DIAGNOSIS — E1165 Type 2 diabetes mellitus with hyperglycemia: Secondary | ICD-10-CM | POA: Diagnosis not present

## 2023-06-01 DIAGNOSIS — M25552 Pain in left hip: Secondary | ICD-10-CM | POA: Diagnosis not present

## 2023-06-01 DIAGNOSIS — M542 Cervicalgia: Secondary | ICD-10-CM | POA: Diagnosis not present

## 2023-06-01 DIAGNOSIS — G8221 Paraplegia, complete: Secondary | ICD-10-CM | POA: Diagnosis not present

## 2023-06-01 DIAGNOSIS — M961 Postlaminectomy syndrome, not elsewhere classified: Secondary | ICD-10-CM | POA: Diagnosis not present

## 2023-06-18 DIAGNOSIS — Z433 Encounter for attention to colostomy: Secondary | ICD-10-CM | POA: Diagnosis not present

## 2023-06-22 DIAGNOSIS — E118 Type 2 diabetes mellitus with unspecified complications: Secondary | ICD-10-CM | POA: Diagnosis not present

## 2023-06-22 DIAGNOSIS — J329 Chronic sinusitis, unspecified: Secondary | ICD-10-CM | POA: Diagnosis not present

## 2023-06-22 DIAGNOSIS — B3731 Acute candidiasis of vulva and vagina: Secondary | ICD-10-CM | POA: Diagnosis not present

## 2023-06-22 DIAGNOSIS — G894 Chronic pain syndrome: Secondary | ICD-10-CM | POA: Diagnosis not present

## 2023-06-24 DIAGNOSIS — E1165 Type 2 diabetes mellitus with hyperglycemia: Secondary | ICD-10-CM | POA: Diagnosis not present

## 2023-06-29 DIAGNOSIS — M542 Cervicalgia: Secondary | ICD-10-CM | POA: Diagnosis not present

## 2023-06-29 DIAGNOSIS — M25552 Pain in left hip: Secondary | ICD-10-CM | POA: Diagnosis not present

## 2023-06-29 DIAGNOSIS — G8221 Paraplegia, complete: Secondary | ICD-10-CM | POA: Diagnosis not present

## 2023-06-29 DIAGNOSIS — M961 Postlaminectomy syndrome, not elsewhere classified: Secondary | ICD-10-CM | POA: Diagnosis not present

## 2023-07-01 DIAGNOSIS — Z936 Other artificial openings of urinary tract status: Secondary | ICD-10-CM | POA: Diagnosis not present

## 2023-07-02 DIAGNOSIS — L89154 Pressure ulcer of sacral region, stage 4: Secondary | ICD-10-CM | POA: Diagnosis not present

## 2023-07-02 DIAGNOSIS — L89894 Pressure ulcer of other site, stage 4: Secondary | ICD-10-CM | POA: Diagnosis not present

## 2023-07-02 DIAGNOSIS — L8990 Pressure ulcer of unspecified site, unspecified stage: Secondary | ICD-10-CM | POA: Diagnosis not present

## 2023-07-05 IMAGING — CT CT HEAD W/O CM
3 series · 15 of 47 positions shown, 18 images · non-contrast
Comparison: January 03, 2018

CLINICAL DATA: Altered mental status.

EXAM:
CT HEAD WITHOUT CONTRAST
TECHNIQUE: Contiguous axial images were obtained from the base of the skull
through the vertex without intravenous contrast.

[Series 2: head wo · axial · 0.49mm/px · z∈[-143,-18]mm · 9 of 31 slices shown, 12 images]
[im 3/31  brain]
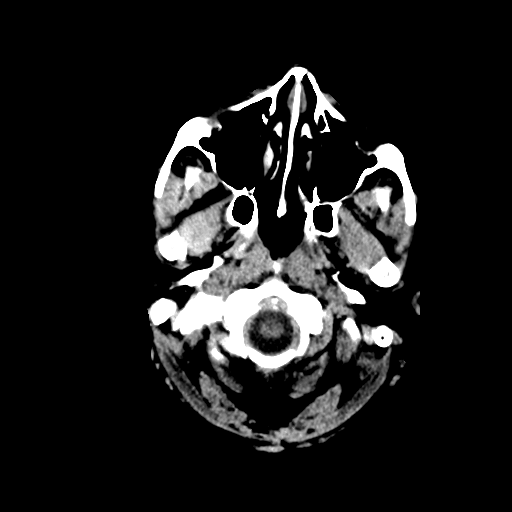
[im 3/31  bone]
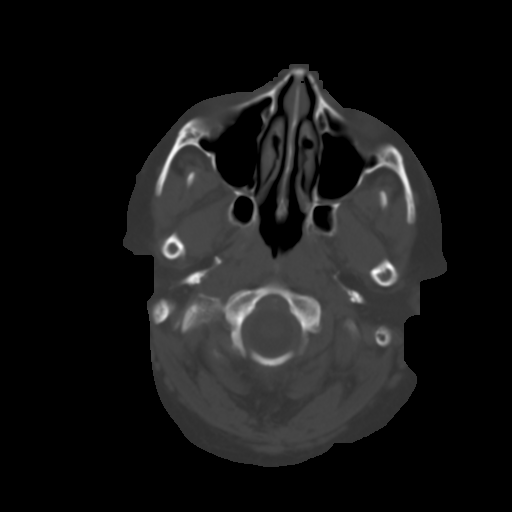
[im 6/31  brain]
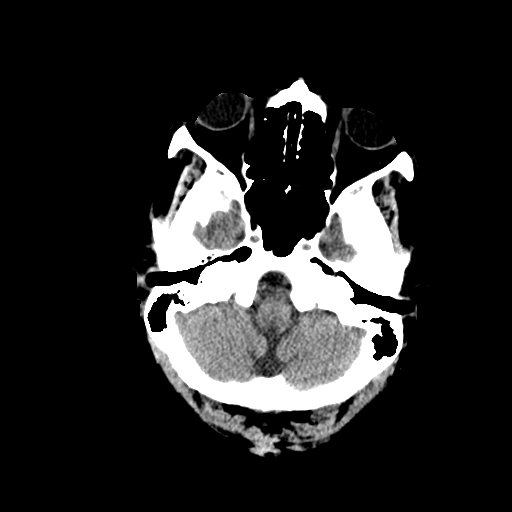
[im 9/31  brain]
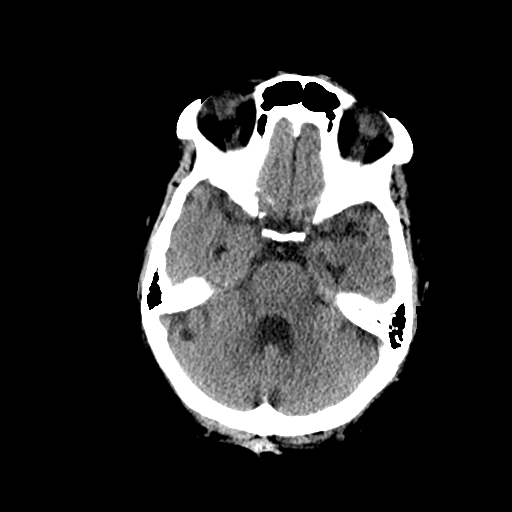
[im 12/31  brain]
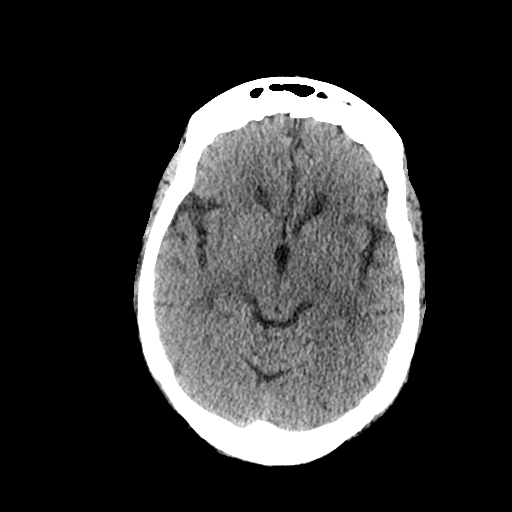
[im 16/31  brain]
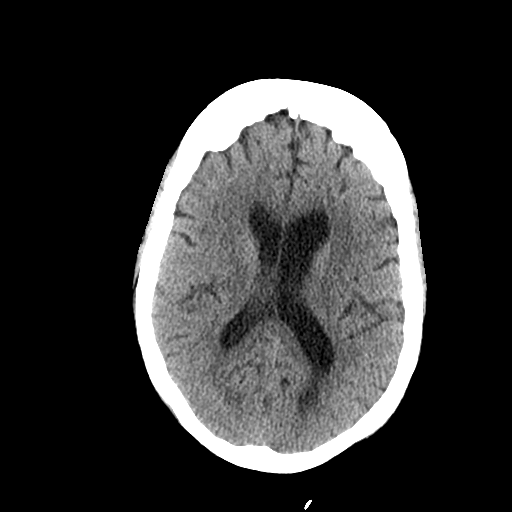
[im 16/31  bone]
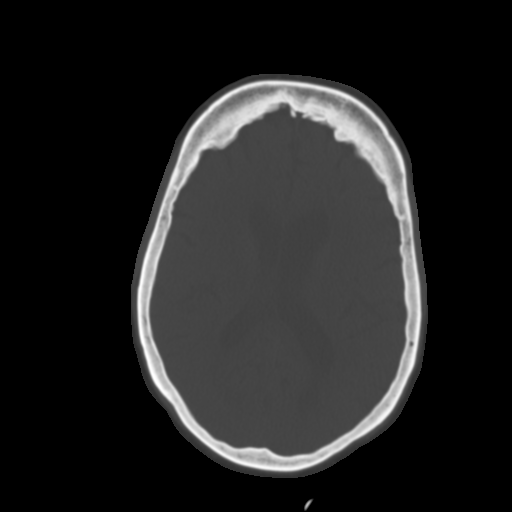
[im 19/31  brain]
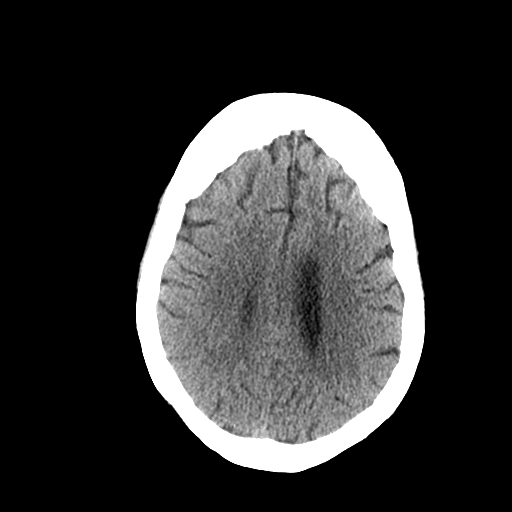
[im 22/31  brain]
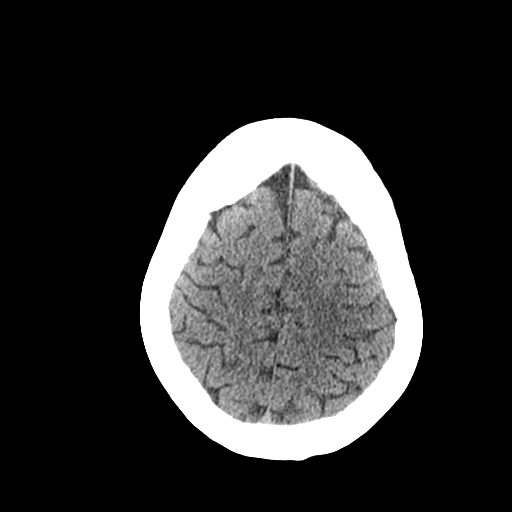
[im 25/31  brain]
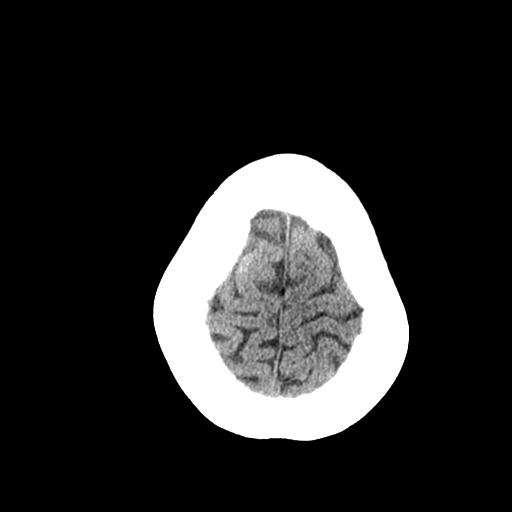
[im 28/31  brain]
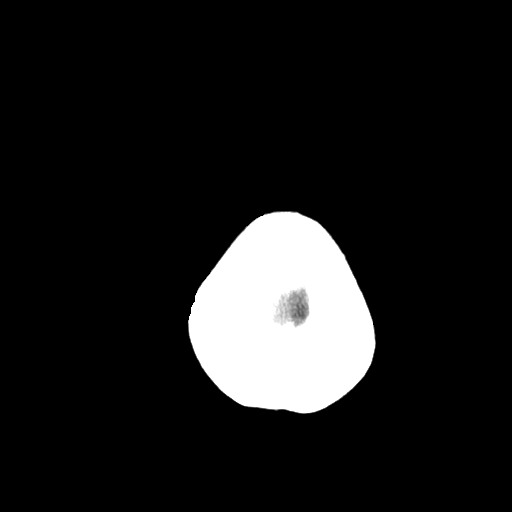
[im 28/31  bone]
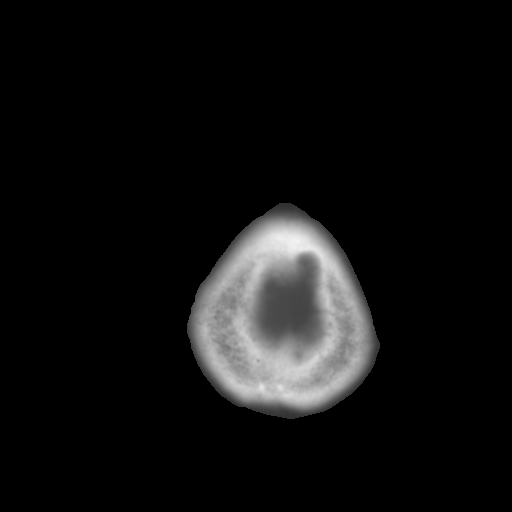

[Series 5: coronal soft tissue · coronal · 0.32mm/px · 3 of 76 slices shown]
[im 26/76  brain]
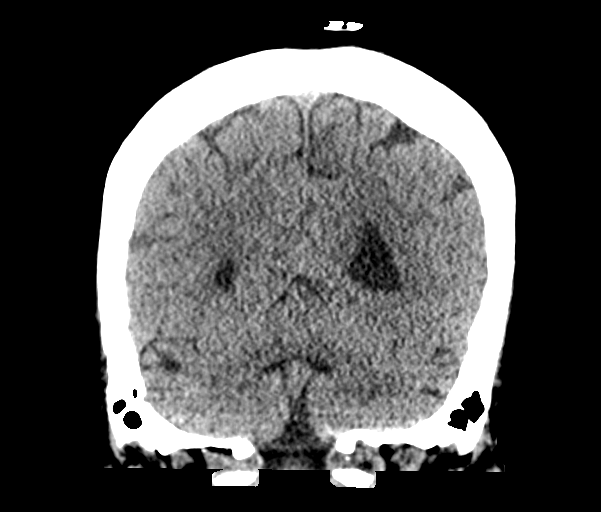
[im 34/76  brain]
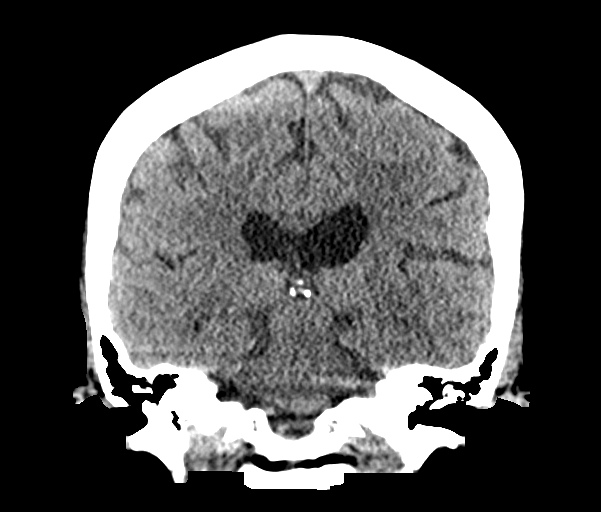
[im 42/76  brain]
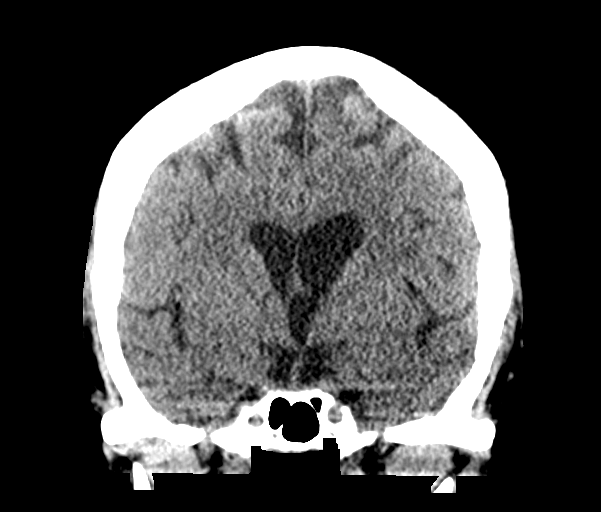

[Series 6: sagittal soft tissue · sagittal · 0.31mm/px · 3 of 61 slices shown]
[im 21/61  brain]
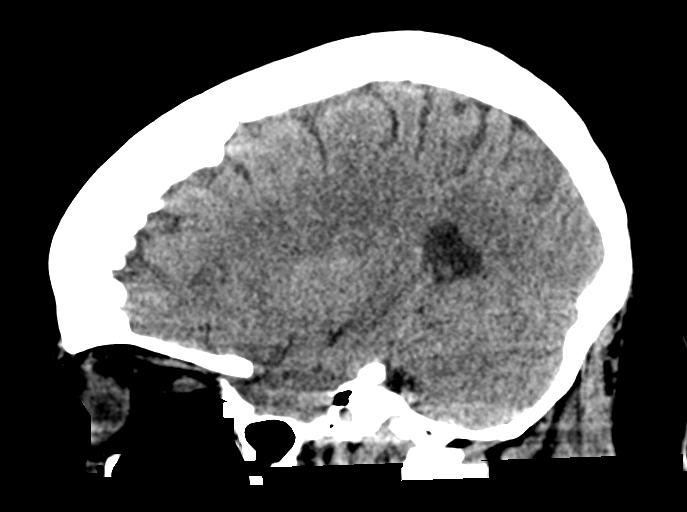
[im 31/61  brain]
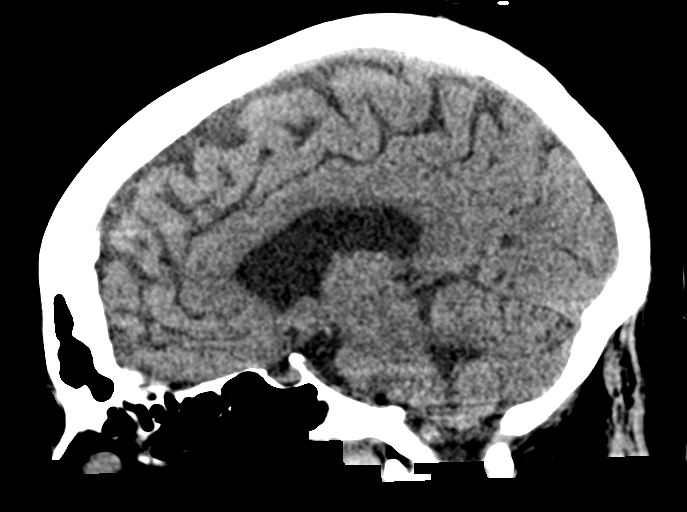
[im 41/61  brain]
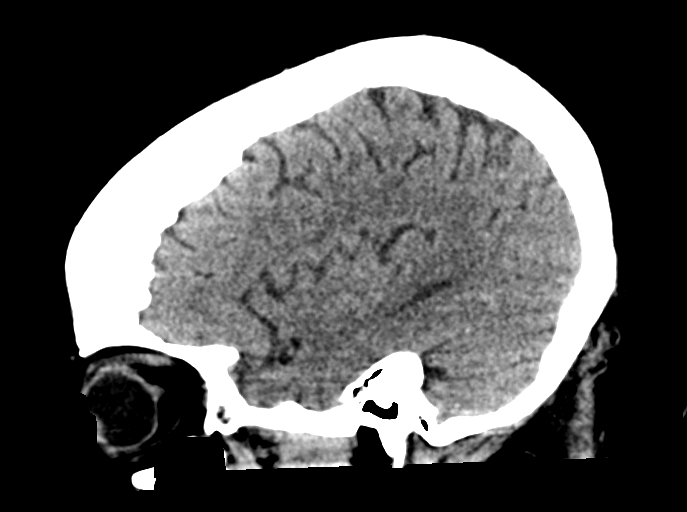

[15 of 47 positions shown; findings below may reference images not displayed]

FINDINGS: Brain: There is mild cerebral atrophy with widening of the
extra-axial spaces and ventricular dilatation.
There are areas of decreased attenuation within the white matter
tracts of the supratentorial brain, consistent with microvascular
disease changes.

Vascular: No hyperdense vessel or unexpected calcification.

Skull: Normal. Negative for fracture or focal lesion.

Sinuses/Orbits: No acute finding.

Other: None.
IMPRESSION: 1. Generalized cerebral atrophy.
2. No acute intracranial abnormality.

## 2023-07-25 ENCOUNTER — Encounter (INDEPENDENT_AMBULATORY_CARE_PROVIDER_SITE_OTHER): Payer: Self-pay | Admitting: Otolaryngology

## 2024-08-26 ENCOUNTER — Institutional Professional Consult (permissible substitution) (INDEPENDENT_AMBULATORY_CARE_PROVIDER_SITE_OTHER): Admitting: Otolaryngology
